# Patient Record
Sex: Male | Born: 1970 | Race: White | Hispanic: No | Smoking: Never smoker
Health system: Southern US, Community
[De-identification: ages and names within clinical notes are randomized; demographics above are authoritative.]

## PROBLEM LIST (undated history)

## (undated) ENCOUNTER — Emergency Department (HOSPITAL_COMMUNITY)

## (undated) DIAGNOSIS — I872 Venous insufficiency (chronic) (peripheral): Secondary | ICD-10-CM

## (undated) DIAGNOSIS — N289 Disorder of kidney and ureter, unspecified: Secondary | ICD-10-CM

## (undated) DIAGNOSIS — I10 Essential (primary) hypertension: Secondary | ICD-10-CM

## (undated) DIAGNOSIS — I861 Scrotal varices: Secondary | ICD-10-CM

## (undated) DIAGNOSIS — Z8619 Personal history of other infectious and parasitic diseases: Secondary | ICD-10-CM

## (undated) DIAGNOSIS — Z87448 Personal history of other diseases of urinary system: Secondary | ICD-10-CM

## (undated) DIAGNOSIS — S82409A Unspecified fracture of shaft of unspecified fibula, initial encounter for closed fracture: Secondary | ICD-10-CM

## (undated) DIAGNOSIS — S82209A Unspecified fracture of shaft of unspecified tibia, initial encounter for closed fracture: Secondary | ICD-10-CM

## (undated) HISTORY — DX: Personal history of other diseases of urinary system: Z87.448

## (undated) HISTORY — DX: Unspecified fracture of shaft of unspecified fibula, initial encounter for closed fracture: S82.409A

## (undated) HISTORY — DX: Unspecified fracture of shaft of unspecified tibia, initial encounter for closed fracture: S82.209A

## (undated) HISTORY — DX: Personal history of other infectious and parasitic diseases: Z86.19

## (undated) HISTORY — DX: Scrotal varices: I86.1

## (undated) HISTORY — DX: Venous insufficiency (chronic) (peripheral): I87.2

## (undated) HISTORY — DX: Disorder of kidney and ureter, unspecified: N28.9

## (undated) HISTORY — DX: Essential (primary) hypertension: I10

---
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
## 2001-04-21 ENCOUNTER — Encounter (INDEPENDENT_AMBULATORY_CARE_PROVIDER_SITE_OTHER): Payer: Self-pay | Admitting: Specialist

## 2001-04-21 ENCOUNTER — Other Ambulatory Visit: Admission: RE | Admit: 2001-04-21 | Discharge: 2001-04-21 | Payer: Self-pay | Admitting: Otolaryngology

## 2004-04-12 ENCOUNTER — Ambulatory Visit (HOSPITAL_COMMUNITY): Admission: RE | Admit: 2004-04-12 | Discharge: 2004-04-12 | Payer: Self-pay | Admitting: Nephrology

## 2004-04-14 ENCOUNTER — Encounter (INDEPENDENT_AMBULATORY_CARE_PROVIDER_SITE_OTHER): Payer: Self-pay | Admitting: Specialist

## 2004-04-14 ENCOUNTER — Ambulatory Visit (HOSPITAL_COMMUNITY): Admission: RE | Admit: 2004-04-14 | Discharge: 2004-04-15 | Payer: Self-pay | Admitting: Nephrology

## 2006-12-24 ENCOUNTER — Ambulatory Visit: Payer: Self-pay | Admitting: Vascular Surgery

## 2007-01-16 ENCOUNTER — Ambulatory Visit: Payer: Self-pay | Admitting: Vascular Surgery

## 2007-05-15 ENCOUNTER — Ambulatory Visit: Payer: Self-pay | Admitting: Vascular Surgery

## 2007-07-28 ENCOUNTER — Ambulatory Visit: Payer: Self-pay | Admitting: Vascular Surgery

## 2007-08-04 ENCOUNTER — Ambulatory Visit: Payer: Self-pay | Admitting: Vascular Surgery

## 2007-11-04 ENCOUNTER — Ambulatory Visit: Payer: Self-pay | Admitting: Vascular Surgery

## 2008-02-17 ENCOUNTER — Encounter: Payer: Self-pay | Admitting: Family Medicine

## 2008-11-04 ENCOUNTER — Ambulatory Visit: Payer: Self-pay | Admitting: Family Medicine

## 2008-11-04 DIAGNOSIS — M109 Gout, unspecified: Secondary | ICD-10-CM | POA: Insufficient documentation

## 2008-11-04 DIAGNOSIS — I151 Hypertension secondary to other renal disorders: Secondary | ICD-10-CM | POA: Insufficient documentation

## 2008-11-04 DIAGNOSIS — N028 Recurrent and persistent hematuria with other morphologic changes: Secondary | ICD-10-CM | POA: Insufficient documentation

## 2008-11-04 DIAGNOSIS — M2020 Hallux rigidus, unspecified foot: Secondary | ICD-10-CM | POA: Insufficient documentation

## 2008-11-05 LAB — CONVERTED CEMR LAB
Albumin: 4.2 g/dL (ref 3.5–5.2)
Calcium: 9.9 mg/dL (ref 8.4–10.5)
Creatinine, Ser: 2.4 mg/dL — ABNORMAL HIGH (ref 0.4–1.5)
GFR calc Af Amer: 39 mL/min
Glucose, Bld: 74 mg/dL (ref 70–99)
Potassium: 5 meq/L (ref 3.5–5.1)

## 2008-12-02 ENCOUNTER — Ambulatory Visit: Payer: Self-pay | Admitting: Family Medicine

## 2008-12-14 ENCOUNTER — Encounter (INDEPENDENT_AMBULATORY_CARE_PROVIDER_SITE_OTHER): Payer: Self-pay | Admitting: *Deleted

## 2008-12-14 ENCOUNTER — Ambulatory Visit: Payer: Self-pay | Admitting: Family Medicine

## 2008-12-14 LAB — CONVERTED CEMR LAB
HDL: 44 mg/dL (ref >39.0)
VLDL: 21 mg/dL (ref 0–40)

## 2009-02-26 ENCOUNTER — Ambulatory Visit: Payer: Self-pay | Admitting: Cardiology

## 2009-03-03 ENCOUNTER — Encounter: Payer: Self-pay | Admitting: Cardiology

## 2009-03-03 ENCOUNTER — Ambulatory Visit: Payer: Self-pay | Admitting: Family Medicine

## 2009-03-03 DIAGNOSIS — R079 Chest pain, unspecified: Secondary | ICD-10-CM | POA: Insufficient documentation

## 2009-03-03 LAB — CONVERTED CEMR LAB
BUN: 22 mg/dL (ref 6–23)
CO2: 33 meq/L — ABNORMAL HIGH (ref 19–32)
Calcium: 9.7 mg/dL (ref 8.4–10.5)
Creatinine, Ser: 2 mg/dL — ABNORMAL HIGH (ref 0.4–1.5)
Glucose, Bld: 77 mg/dL (ref 70–99)
Lipase: 8 units/L — ABNORMAL LOW (ref 11.0–59.0)
Potassium: 5.1 meq/L (ref 3.5–5.1)

## 2009-03-07 ENCOUNTER — Encounter: Payer: Self-pay | Admitting: Cardiology

## 2009-03-07 ENCOUNTER — Ambulatory Visit: Payer: Self-pay | Admitting: Cardiology

## 2009-03-07 LAB — CONVERTED CEMR LAB
Cholesterol, target level: 200 mg/dL
LDL Goal: 160 mg/dL

## 2009-03-11 ENCOUNTER — Ambulatory Visit: Payer: Self-pay | Admitting: Cardiology

## 2009-03-21 ENCOUNTER — Encounter: Payer: Self-pay | Admitting: Family Medicine

## 2009-09-28 ENCOUNTER — Encounter: Payer: Self-pay | Admitting: Family Medicine

## 2009-10-29 ENCOUNTER — Encounter: Payer: Self-pay | Admitting: Family Medicine

## 2010-03-23 ENCOUNTER — Encounter: Payer: Self-pay | Admitting: Family Medicine

## 2010-04-04 ENCOUNTER — Ambulatory Visit: Payer: Self-pay | Admitting: Family Medicine

## 2010-04-04 ENCOUNTER — Telehealth (INDEPENDENT_AMBULATORY_CARE_PROVIDER_SITE_OTHER): Payer: Self-pay | Admitting: *Deleted

## 2010-04-04 LAB — CONVERTED CEMR LAB
LDL Cholesterol: 78 mg/dL (ref 0–99)
Total CHOL/HDL Ratio: 3
Triglycerides: 56 mg/dL (ref 0.0–149.0)

## 2010-04-06 ENCOUNTER — Ambulatory Visit: Payer: Self-pay | Admitting: Family Medicine

## 2010-04-06 DIAGNOSIS — R6882 Decreased libido: Secondary | ICD-10-CM | POA: Insufficient documentation

## 2010-04-06 DIAGNOSIS — R5383 Other fatigue: Secondary | ICD-10-CM

## 2010-04-06 DIAGNOSIS — R5381 Other malaise: Secondary | ICD-10-CM | POA: Insufficient documentation

## 2010-04-07 LAB — CONVERTED CEMR LAB
Sex Hormone Binding: 45 nmol/L (ref 13–71)
Testosterone Free: 79.4 pg/mL (ref 47.0–244.0)

## 2010-11-14 NOTE — Letter (Signed)
Summary: [facility]   Imported By: Bubba Hales 04/01/2010 11:38:04  _____________________________________________________________________  External Attachment:    Type:   Image     Comment:   External Document

## 2010-11-14 NOTE — Letter (Signed)
Summary: [facility]   Imported By: Phillis Knack 11/04/2009 14:25:56  _____________________________________________________________________  External Attachment:    Type:   Image     Comment:   External Document

## 2010-11-14 NOTE — Progress Notes (Signed)
----   Converted from flag ---- ---- 04/04/2010 8:31 AM, Owens Loffler MD wrote: FLP, v77.91  ---- 04/03/2010 7:56 AM, Daralene Milch CMA (AAMA) wrote: Pt is scheduled for cpx labs tomorrow, what labs to draw and dx codes? Thanks Tasha ------------------------------

## 2010-11-14 NOTE — Assessment & Plan Note (Signed)
Summary: CPX/CLE   Vital Signs:  Patient profile:   40 year old male Height:      75 inches Weight:      250.8 pounds BMI:     31.46 Temp:     99.0 degrees F oral Pulse rate:   60 / minute Pulse rhythm:   regular BP sitting:   140 / 80  (left arm) Cuff size:   regular  Vitals Entered By: Zenda Alpers CMA Deborra Medina) (April 06, 2010 2:27 PM)  History of Present Illness: Chief complaint cpx  40 year old male:  HTN - elevated initially, but now normal on recheck 120/70  CKD, Dr. Shelva Majestic notes reviewed. Cr stable  Fatigue and decrease sexual drive: stays tired all time. feeling tired. Has been a long time and decreased sexual desire over the last year -- able to usually get an erection, but not so much interested in initiating.     Preventive Screening-Counseling & Management  Alcohol-Tobacco     Alcohol Counseling: not indicated; use of alcohol is not excessive or problematic     Smoking Status: never     Tobacco Counseling: not indicated; no tobacco use  Caffeine-Diet-Exercise     Diet Comments: working on diet     Diet Counseling: not indicated; diet is assessed to be healthy     Does Patient Exercise: yes     Type of exercise: running     Exercise (avg: min/session): 30-60     Times/week: 5     Exercise Counseling: not indicated; exercise is adequate  Hep-HIV-STD-Contraception     HIV Risk: no risk noted     STD Risk: no risk noted     Testicular SE Education/Counseling to perform regular STE      Sexual History:  currently monogamous.        Drug Use:  never.    Clinical Review Panels:  Immunizations   Last Tetanus Booster:  Tdap (12/02/2008)  Lipid Management   Cholesterol:  145 (04/04/2010)   LDL (bad choesterol):  78 (04/04/2010)   HDL (good cholesterol):  55.80 (04/04/2010)  Diabetes Management   Creatinine:  2.0 (03/03/2009)   Allergies (verified): No Known Drug Allergies  Past History:  Past medical, surgical, family and social  histories (including risk factors) reviewed, and no changes noted (except as noted below).  Past Medical History: Reviewed history from 03/07/2009 and no changes required. 1. Hypertension 2. Ig A Nephropathy - dx. 2002.  Microscopic hematuria and proteinuria.  3. Tib/Fib Fx, ORIF, 1990 4. Gout, non-crystal proven 5. Varicocele, L testes 6. Venous incompetence R leg  Dr. Moshe Cipro, [facility]  Past Surgical History: Reviewed history from 11/04/2008 and no changes required. Tib/Fib Fx, ORIF, 1990 Appendectomy, 1982  Family History: Reviewed history from 11/04/2008 and no changes required. HTN: GP DM: GP  Social History: Reviewed history from 11/04/2008 and no changes required. Divorced Primary custody of son [place] Never Smoked Alcohol use-no Drug use-no Regular exercise-yes HIV Risk:  no risk noted STD Risk:  no risk noted Sexual History:  currently monogamous Drug Use:  never  Review of Systems       The patient complains of weight loss.  The patient denies anorexia, fever, weight gain, vision loss, decreased hearing, hoarseness, chest pain, syncope, dyspnea on exertion, peripheral edema, prolonged cough, headaches, hemoptysis, abdominal pain, melena, hematochezia, severe indigestion/heartburn, hematuria, incontinence, genital sores, suspicious skin lesions, depression, abnormal bleeding, enlarged lymph nodes, and testicular masses.  Otherwise, the pertinent positives and negatives are listed above and in the HPI, otherwise a full review of systems has been reviewed and is negative unless noted positive.   HAS BEEN TRYING TO LOSE WEIGHT - 100 POUND LOSS  Physical Exam  General:  Well-developed,well-nourished,in no acute distress; alert,appropriate and cooperative throughout examination Head:  Normocephalic and atraumatic without obvious abnormalities. No apparent alopecia or balding. Eyes:  pupils equal, pupils round, pupils reactive to  light, and pupils react to accomodation.   Ears:  External ear exam shows no significant lesions or deformities.  Otoscopic examination reveals clear canals, tympanic membranes are intact bilaterally without bulging, retraction, inflammation or discharge. Hearing is grossly normal bilaterally. Nose:  External nasal examination shows no deformity or inflammation. Nasal mucosa are pink and moist without lesions or exudates. Mouth:  Oral mucosa and oropharynx without lesions or exudates.  Teeth in good repair. Neck:  No deformities, masses, or tenderness noted. Chest Wall:  No deformities, masses, tenderness or gynecomastia noted. Lungs:  Normal respiratory effort, chest expands symmetrically. Lungs are clear to auscultation, no crackles or wheezes. Heart:  Normal rate and regular rhythm. S1 and S2 normal without gallop, murmur, click, rub or other extra sounds. Abdomen:  Bowel sounds positive,abdomen soft and non-tender without masses, organomegaly or hernias noted. Genitalia:  circumcised, no scrotal masses, no testicular masses or atrophy, no cutaneous lesions, and no urethral discharge.  varicocele Msk:  normal ROM and no crepitation.   Extremities:  No clubbing, cyanosis, edema, or deformity noted with normal full range of motion of all joints.   Neurologic:  alert & oriented X3 and gait normal.   Skin:  Intact without suspicious lesions or rashes Cervical Nodes:  No lymphadenopathy noted Inguinal Nodes:  No significant adenopathy Psych:  Cognition and judgment appear intact. Alert and cooperative with normal attention span and concentration. No apparent delusions, illusions, hallucinations   Impression & Recommendations:  Problem # 1:  [place] (ICD-V70.0) The patient's preventative maintenance and recommended screening tests for an annual wellness exam were reviewed in full today. Brought up to date unless services declined.  Counselled on the importance of diet, exercise,  and its role in overall health and mortality. The patient's FH and SH was reviewed, including their home life, tobacco status, and drug and alcohol status.   Keep up the running - training for marathon  Problem # 2:  FATIGUE (ICD-780.79) Assessment: New fatigue with decreased libido desires testosterone level check  Orders: Venipuncture [id number]) TLB-TSH (Thyroid Stimulating Hormone) (84443-TSH) T- * Misc. Laboratory test [phone])  Problem # 3:  LIBIDO, DECREASED (ICD-799.81) Assessment: New  Complete Medication List: 1)  Benazepril Hcl 40 Mg Tabs (Benazepril hcl) .... Take one by mouth daily 2)  Fish Oil Oil (Fish oil) .Marland Kitchen.. 1200 mg. once daily  Current Allergies (reviewed today): No known allergies

## 2011-01-24 ENCOUNTER — Encounter: Payer: Self-pay | Admitting: Family Medicine

## 2011-02-27 NOTE — Procedures (Signed)
DUPLEX DEEP VENOUS EXAM - LOWER EXTREMITY   INDICATION:  Follow-up evaluation of right leg, status post greater  saphenous vein ablation.   HISTORY:  Edema:  No.  Trauma/Surgery:  Ablation of two branches of the right greater saphenous  vein on 07/28/07 with laser by Dr. Kellie Simmering.  Pain:  No.  PE:  No.  Previous DVT:  No.  Anticoagulants:  No.  Other:   DUPLEX EXAM:                CFV   SFV   PopV  PTV    GSV                R  L  R  L  R  L  R   L  R  L  Thrombosis    o  o  o     o     o      +  Spontaneous   +  +  +     +     +      0  Phasic        +  +  +     +     +      0  Augmentation  +  +  +     +     +      0  Compressible  +  +  +     +     +      0  Competent     0  +  +     +     +      0   Legend:  + - yes  o - no  p - partial  D - decreased   IMPRESSION:  1. Previously ablated right greater saphenous vein and anterior branch      appear to be thrombosed.  2. No evidence of right leg deep venous thrombosis or Baker's cyst.  3. The right common femoral vein is patent.  4. The right common femoral vein is incompetent, and there are small      incompetent branches seen at the proximal anterior branch proximal      to the thrombus.      _____________________________  Nelda Severe Kellie Simmering, M.D.   MC/MEDQ  D:  11/04/2007  T:  11/04/2007  Job:  [id number]

## 2011-02-27 NOTE — Assessment & Plan Note (Signed)
OFFICE VISIT   FLEET, RIGGAN  DOB:  16-Nov-1970                                       08/04/2007  [id number]   Eric Douglas returns one week postlaser ablation right greater saphenous  vein, which required laser ablation of not only the greater saphenous  vein, but also the anterior branch of the great saphenous vein of the  right leg. These two converged very close to the saphenofemoral  junction. A laser ablation was performed on each of these. He also had  multiple stab phlebectomies in the right thigh and calf.  He returns  today with no symptoms of discomfort related to stab phlebectomies. He  has had some aching in the thigh as one would expect related to the  laser ablation procedures. He has had some mild bruising. He has had no  distal edema or radicular pain in the leg.   On examination, blood pressure is 146/80. Heart rate is 64. Respirations  are 14. His right leg is healing nicely with some mild ecchymosis in the  thigh near the entrance site in the distal thigh. There is no distal  edema. He has excellent arterial pulses. I performed a limited venous  duplex exam today and there is no evidence of any deep venous  obstruction or thrombus.  There is normal flow in the deep system. He  does have occlusion of both the anterior branch and the greater  saphenous vein from near the saphenofemoral junction throughout the  thigh.  I reassured him regarding these findings and he will return to  see [place] in three months for final follow up.  He will wear his elastic  stocking for another few weeks before discontinuing it.   Eric Douglas, M.D.  Electronically Signed   JDL/MEDQ  D:  08/04/2007  T:  08/05/2007  Job:  [id number]

## 2011-02-27 NOTE — Procedures (Signed)
LOWER EXTREMITY VENOUS REFLUX EXAM   INDICATION:  Right leg posterior calf varicose veins.   The patient is status post left leg greater saphenous vein stripping and  stab phlebectomy   EXAM:  Using color-flow imaging and pulse Doppler spectral analysis, the  right common femoral, superficial femoral, popliteal, posterior tibial,  greater and lesser saphenous veins are evaluated.  There is evidence  suggesting deep venous insufficiency in the right lower extremity.   The right saphenofemoral junction is not competent.  The right greater  saphenous vein is not competent with the caliber as described below.   The right proximal short saphenous vein demonstrates incompetency.   GSV Diameter (used if found to be incompetent only)                                            Right    Left  Proximal Greater Saphenous Vein           0.46 cm  cm  Proximal-to-mid-thigh                     0.36 cm  cm  Mid thigh                                 0.38 cm  cm  Mid-distal thigh                          0.45 cm  cm  Distal thigh                              0.38 cm  cm  Knee                                      0.36 cm  cm   PROXIMAL GREATER SAPHENOUS VEIN ANTERIOR BRANCH:  0.31   PROXIMAL TO MID-THIGH ANTERIOR BRANCH:  0.35   MID-THIGH ANTERIOR BRANCH:  0.26   MID-DISTAL THIGH ANTERIOR BRANCH:  0.25   DISTAL THIGH ANTERIOR BRANCH:  0.23   IMPRESSION:  1. Right greater saphenous vein reflux is identified with the caliber      ranging from 0.36 cm to 0.46 cm knee to groin.  2. The right greater saphenous vein is not aneurysmal.  3. The right greater saphenous vein is not tortuous.  4. The deep venous system is not competent.  5. The right lesser saphenous vein is not competent.  6. The anterior branch of the greater saphenous vein is incompetent,      and a branch 10 cm proximal to the knee gives rise to the most      prominent varicose veins.   ___________________________________________  Dr. Kellie Simmering   MC/MEDQ  D:  05/15/2007  T:  05/16/2007  Job:  [id number]

## 2011-02-27 NOTE — Assessment & Plan Note (Signed)
OFFICE VISIT   Eric Douglas, Eric Douglas  DOB:  1971-04-09                                       07/28/2007  [id number]   PREOPERATIVE DIAGNOSIS:  Severe venous insufficiency right leg secondary  to greater saphenous and anterior saphenous reflux.   ADDENDUM:  Addendum to surgical procedure performed October 13.  The  patient underwent laser ablation to the right greater saphenous vein  with multiple stab phlebectomies.  He was noted on his preoperative  duplex scan to have severe reflux not only in his greater saphenous vein  but also a large anterior saphenous vein that ran parallel to the  greater saphenous vein and supplied several of the varicosities in the  distal thigh, which were symptomatic.  These 2 veins converged in too  close a proximity to the common femoral vein that it was not felt safe  to ablate the greater saphenous across the origin of the anterior  saphenous branch, and this necessitated laser ablation procedures in  both of these veins.  Therefore, after infiltration with Xylocaine, not  only was the right greater saphenous vein cannulated with a long sheath  for laser ablation, but in a similar fashion, the right anterior  saphenous vein was cannulated with a shorter sheath up to a point just  proximal to the saphenofemoral junction within the anterior branch.  Laser ablation was then performed and not only the right greater  saphenous vein, but also the right anterior saphenous vein without  difficulty.  Following this, multiple stab phlebectomies were performed  as described in the original operative note.  This secondary procedure  did increase the operative time approximately 30 minutes for the  secondary procedure.   Nelda Severe Kellie Simmering, M.D.  Electronically Signed   JDL/MEDQ  D:  07/29/2007  T:  07/30/2007  Job:  [id number]

## 2011-02-27 NOTE — Assessment & Plan Note (Signed)
OFFICE VISIT   MIKO, Eric Douglas  DOB:  [birth year]                                       11/04/2007  [id number]   The patient returns now 3 months post bilateral venous procedures in the  office.  He had stab phlebectomies performed in the left leg,  particularly in the posterior thigh and the calf, having had previous  vein stripping in the left leg many years ago.  On the right side, he  had laser ablation of 2 parallel venous systems in the thigh with the  anterior accessory and greater saphenous systems.  He then had multiple  stab phlebectomies.  He has had tremendous improvement in his aching and  throbbing discomfort in both legs, as well as the swelling in the calf  and ankle areas.  He is currently having no edema and no discomfort, and  is not wearing elastic compression stockings.  He has had no stasis  ulcers or bleeding.   EXAM:  Blood pressure 146/81.  Both lower extremities have excellent  femoral, popliteal, and posterior tibial pulses at 3+.  There is no  evidence of any recurrent varicosities in the calf or thigh either  anteriorly or posteriorly.  No distal edema is noted.  There is no  hyperpigmentation or ulceration noted.  Final venous duplex exam was  performed today on the right side and both channels are chronically  occluded where the laser ablation was performed in the thigh with the  filling of 1 small circumflex epigastric branch near the saphenofemoral  junction.  The deep venous system is widely patent.  He was reassured  regarding these findings.  Final photographs were taken.  He will return  to see [place] on a p.r.n. basis.   Nelda Severe Kellie Simmering, M.D.  Electronically Signed   JDL/MEDQ  D:  11/04/2007  T:  11/05/2007  Job:  [id number]

## 2011-02-27 NOTE — Assessment & Plan Note (Signed)
OFFICE VISIT   SKEETER, HOOPLE  DOB:  1971-07-11                                       05/15/2007  [id number]   Eric Douglas is status post ligation and stripping of his greater  saphenous vein at [facility] in 1995 and underwent some multiple stab  phlebectomies to relieve his symptoms in the left leg.  He has done very  well from that standpoint.  He now returns for symptoms involving the  contralateral right leg which involves his distal thigh and calf area.  He has worn elastic compression stockings for the last three months but  is unable to relieve these symptoms which he describes as aching,  throbbing and itching and discomfort.  These are relieved by elevating  his legs which he is unable during work.  He does take analgesics, but  they have not completely relieved his symptoms either.  The  symptomatology seems to be becoming more and more worse and affecting  his daily living   On exam he has 3+ femoral, popliteal and dorsalis pedis pulses are  palpable in the right leg.  He has some mild dyslipidemia on the right  side.  He has a prominent varix in the posterior leg which extends up  the calf into the distal thigh and communicates to the greater saphenous  system immediately.  There is no hyperpigmentation or ulceration noted.  A venous duplex exam was performed in our office today and has the  following findings.  He has reflux and valvular incompetence involving  the right saphenous-femoral junction which involves not only the greater  saphenous vein but also a large anterior branch which communicates at  the junction.  The short saphenous vein is also incompetent.  It does  not appear to be the primary source of the varicosities.   I feel that [name] would be best treated by laser ablation of his  right greater saphenous vein and also the anterior saphenous branch to  eliminate the venous hypertension in his thigh and then  perform stab  phlebectomies over these varicosities in the posterior calf and thigh.  We will proceed with precertification.  If he should develop recurrent  varicosities in the future then the short saphenous system could be  treated.  He has been wearing his long leg elastic compression stockings  over the last four months without relief of symptoms.   Nelda Severe Kellie Simmering, M.D.  Electronically Signed   JDL/MEDQ  D:  05/15/2007  T:  05/16/2007  Job:  [id number]

## 2011-05-09 ENCOUNTER — Encounter: Payer: Self-pay | Admitting: Cardiology

## 2013-04-01 ENCOUNTER — Encounter: Payer: Self-pay | Admitting: Nephrology

## 2016-01-24 ENCOUNTER — Ambulatory Visit (INDEPENDENT_AMBULATORY_CARE_PROVIDER_SITE_OTHER): Payer: BLUE CROSS/BLUE SHIELD | Admitting: Internal Medicine

## 2016-01-24 ENCOUNTER — Encounter: Payer: Self-pay | Admitting: Internal Medicine

## 2016-01-24 VITALS — BP 130/76 | HR 72 | Temp 99.0°F | Ht 77.25 in | Wt 337.8 lb

## 2016-01-24 DIAGNOSIS — N289 Disorder of kidney and ureter, unspecified: Secondary | ICD-10-CM

## 2016-01-24 DIAGNOSIS — I151 Hypertension secondary to other renal disorders: Secondary | ICD-10-CM | POA: Diagnosis not present

## 2016-01-24 DIAGNOSIS — M1A30X0 Chronic gout due to renal impairment, unspecified site, without tophus (tophi): Secondary | ICD-10-CM

## 2016-01-24 DIAGNOSIS — N079 Hereditary nephropathy, not elsewhere classified with unspecified morphologic lesions: Secondary | ICD-10-CM

## 2016-01-24 DIAGNOSIS — N028 Recurrent and persistent hematuria with other morphologic changes: Secondary | ICD-10-CM

## 2016-01-24 NOTE — Patient Instructions (Signed)
Hypertension Hypertension, commonly called high blood pressure, is when the force of blood pumping through your arteries is too strong. Your arteries are the blood vessels that carry blood from your heart throughout your body. A blood pressure reading consists of a higher number over a lower number, such as 110/72. The higher number (systolic) is the pressure inside your arteries when your heart pumps. The lower number (diastolic) is the pressure inside your arteries when your heart relaxes. Ideally you want your blood pressure below 120/80. Hypertension forces your heart to work harder to pump blood. Your arteries may become narrow or stiff. Having untreated or uncontrolled hypertension can cause heart attack, stroke, kidney disease, and other problems. RISK FACTORS Some risk factors for high blood pressure are controllable. Others are not.  Risk factors you cannot control include:   Race. You may be at higher risk if you are African American.  Age. Risk increases with age.  Gender. Men are at higher risk than women before age 45 years. After age 65, women are at higher risk than men. Risk factors you can control include:  Not getting enough exercise or physical activity.  Being overweight.  Getting too much fat, sugar, calories, or salt in your diet.  Drinking too much alcohol. SIGNS AND SYMPTOMS Hypertension does not usually cause signs or symptoms. Extremely high blood pressure (hypertensive crisis) may cause headache, anxiety, shortness of breath, and nosebleed. DIAGNOSIS To check if you have hypertension, your health care provider will measure your blood pressure while you are seated, with your arm held at the level of your heart. It should be measured at least twice using the same arm. Certain conditions can cause a difference in blood pressure between your right and left arms. A blood pressure reading that is higher than normal on one occasion does not mean that you need treatment. If  it is not clear whether you have high blood pressure, you may be asked to return on a different day to have your blood pressure checked again. Or, you may be asked to monitor your blood pressure at home for 1 or more weeks. TREATMENT Treating high blood pressure includes making lifestyle changes and possibly taking medicine. Living a healthy lifestyle can help lower high blood pressure. You may need to change some of your habits. Lifestyle changes may include:  Following the DASH diet. This diet is high in fruits, vegetables, and whole grains. It is low in salt, red meat, and added sugars.  Keep your sodium intake below 2,300 mg per day.  Getting at least 30-45 minutes of aerobic exercise at least 4 times per week.  Losing weight if necessary.  Not smoking.  Limiting alcoholic beverages.  Learning ways to reduce stress. Your health care provider may prescribe medicine if lifestyle changes are not enough to get your blood pressure under control, and if one of the following is true:  You are 18-59 years of age and your systolic blood pressure is above 140.  You are 60 years of age or older, and your systolic blood pressure is above 150.  Your diastolic blood pressure is above 90.  You have diabetes, and your systolic blood pressure is over 140 or your diastolic blood pressure is over 90.  You have kidney disease and your blood pressure is above 140/90.  You have heart disease and your blood pressure is above 140/90. Your personal target blood pressure may vary depending on your medical conditions, your age, and other factors. HOME CARE INSTRUCTIONS    Have your blood pressure rechecked as directed by your health care provider.   Take medicines only as directed by your health care provider. Follow the directions carefully. Blood pressure medicines must be taken as prescribed. The medicine does not work as well when you skip doses. Skipping doses also puts you at risk for  problems.  Do not smoke.   Monitor your blood pressure at home as directed by your health care provider. SEEK MEDICAL CARE IF:   You think you are having a reaction to medicines taken.  You have recurrent headaches or feel dizzy.  You have swelling in your ankles.  You have trouble with your vision. SEEK IMMEDIATE MEDICAL CARE IF:  You develop a severe headache or confusion.  You have unusual weakness, numbness, or feel faint.  You have severe chest or abdominal pain.  You vomit repeatedly.  You have trouble breathing. MAKE SURE YOU:   Understand these instructions.  Will watch your condition.  Will get help right away if you are not doing well or get worse.   This information is not intended to replace advice given to you by your health care provider. Make sure you discuss any questions you have with your health care provider.   Document Released: 10/01/2005 Document Revised: 02/15/2015 Document Reviewed: 07/24/2013 Elsevier Interactive Patient Education 2016 [employer]  

## 2016-01-24 NOTE — Assessment & Plan Note (Signed)
Continue Benazepril He will continue to follow with [place]

## 2016-01-24 NOTE — Assessment & Plan Note (Signed)
Continue Benazepril He will continue to follow with [place]

## 2016-01-24 NOTE — Progress Notes (Signed)
HPI  Pt presents to the clinic today to establish care. He has not had a PCP in many years but he has been following with [facility].  Gout: He takes Allopurinol daily. His last flare was about 18 months ago.  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 He takes Benazepril as prescribed. His BP today is 130/76. He does follow with [facility].  Obesity: BMI of 39. He has lost about 25 lbs in the last 4 months. He is working on improving his diet and increasing his exercise.  Flu: 07/2015 Tetanus: 2010 Vision Screening: yearly Dentist: biannually  Past Medical History  Diagnosis Date  . HTN (hypertension)   . Nephropathy     Ig A; dx 2002. micropscopic hematuria and proteinuria  . Tibia fracture     ORIF 1990  . Fibula fracture     ORIF 1990  . Gout     non crystal proven   . Varicocele     L testes  . Venous incompetence     R leg  . History of chickenpox   . History of kidney disease     Current Outpatient Prescriptions  Medication Sig Dispense Refill  . allopurinol (ZYLOPRIM) 100 MG tablet Take 1 tablet by mouth daily.  2  . benazepril (LOTENSIN) 20 MG tablet Take 0.5 tablets by mouth daily.  0   No current facility-administered medications for this visit.    No Known Allergies  Family History  Problem Relation Age of Onset  . Hypertension      GP  . Diabetes      DM - GP     Social History   Social History  . Marital Status: Married    Spouse Name: N/A  . Number of Children: N/A  . Years of Education: N/A   Occupational History  . Not on file.   Social History Main Topics  . Smoking status: Never Smoker   . Smokeless tobacco: Never Used  . Alcohol Use: 0.0 oz/week    0 Standard drinks or equivalent per week     Comment: occ  . Drug Use: No  . Sexual Activity: Not on file   Other Topics Concern  . Not on file   Social History Narrative   Divorced, primary custody of son; [name]; gets regular exercise.        Pt signed designated party release and gives no access to medical records. Can leave msg on cell [phone].     ROS:  Constitutional: Denies fever, malaise, fatigue, headache or abrupt weight changes.  HEENT: Denies eye pain, eye redness, ear pain, ringing in the ears, wax buildup, runny nose, nasal congestion, bloody nose, or sore throat. Respiratory: Denies difficulty breathing, shortness of breath, cough or sputum production.   Cardiovascular: Denies chest pain, chest tightness, palpitations or swelling in the hands or feet.  Gastrointestinal: Denies abdominal pain, bloating, constipation, diarrhea or blood in the stool.  GU: Denies frequency, urgency, pain with urination, blood in urine, odor or discharge. Musculoskeletal: Denies decrease in range of motion, difficulty with gait, muscle pain or joint pain and swelling.  Skin: Denies redness, rashes, lesions or ulcercations.  Neurological: Denies dizziness, difficulty with memory, difficulty with speech or problems with balance and coordination.  Psych: Denies anxiety, depression, SI/HI.  No other specific complaints in a complete review of systems (except as listed in HPI above).  PE:  BP 130/76 mmHg  Pulse 72  Temp(Src) 99 F (37.2 C) (Oral)  Ht 6' 5.25" (1.962 m)  Wt 337 lb 12 oz (153.202 kg)  BMI 39.80 kg/m2  SpO2 97% Wt Readings from Last 3 Encounters:  01/24/16 337 lb 12 oz (153.202 kg)  04/06/10 250 lb 12.8 oz (113.762 kg)  03/07/09 269 lb 8 oz (122.244 kg)    General: Appears his stated age, obese in NAD. Skin: Warm, dry and intact. Cardiovascular: Normal rate and rhythm. S1,S2 noted.  No murmur, rubs or gallops noted. No JVD or BLE edema.  Pulmonary/Chest: Normal effort and positive vesicular breath sounds. No respiratory distress. No wheezes, rales or ronchi noted.  Neurological: Alert and oriented.  Psychiatric: Mood and affect normal. Behavior is normal. Judgment and thought content normal.    BMET     Component Value Date/Time   NA 143 03/03/2009 0853   K 5.1 03/03/2009 0853   CL 104 03/03/2009 0853   CO2 33* 03/03/2009 0853   GLUCOSE 77 03/03/2009 0853   BUN 22 03/03/2009 0853   CREATININE 2.0* 03/03/2009 0853   CALCIUM 9.7 03/03/2009 0853   GFRNONAA 39.80 03/03/2009 0853   GFRAA 39 11/04/2008 1133    Lipid Panel     Component Value Date/Time   CHOL 145 04/04/2010 0833   TRIG 56.0 04/04/2010 0833   HDL 55.80 04/04/2010 0833   CHOLHDL 3 04/04/2010 0833   VLDL 11.2 04/04/2010 0833   LDLCALC 78 04/04/2010 0833     Assessment and Plan:

## 2016-01-24 NOTE — Assessment & Plan Note (Signed)
Continue Allopurinol daily Will check uric acid with annual exam

## 2016-01-24 NOTE — Progress Notes (Signed)
Pre visit review using our clinic review tool, if applicable. No additional management support is needed unless otherwise documented below in the visit note. 

## 2016-05-03 ENCOUNTER — Ambulatory Visit (INDEPENDENT_AMBULATORY_CARE_PROVIDER_SITE_OTHER): Payer: BLUE CROSS/BLUE SHIELD | Admitting: Internal Medicine

## 2016-05-03 ENCOUNTER — Encounter: Payer: Self-pay | Admitting: Internal Medicine

## 2016-05-03 ENCOUNTER — Ambulatory Visit (INDEPENDENT_AMBULATORY_CARE_PROVIDER_SITE_OTHER)
Admission: RE | Admit: 2016-05-03 | Discharge: 2016-05-03 | Disposition: A | Discharge status: Home / Self Care | Payer: BLUE CROSS/BLUE SHIELD | Source: Ambulatory Visit | Attending: Internal Medicine | Admitting: Internal Medicine

## 2016-05-03 VITALS — BP 122/80 | HR 76 | Temp 98.4°F | Ht 77.0 in | Wt 291.0 lb

## 2016-05-03 DIAGNOSIS — M109 Gout, unspecified: Secondary | ICD-10-CM

## 2016-05-03 DIAGNOSIS — N028 Recurrent and persistent hematuria with other morphologic changes: Secondary | ICD-10-CM

## 2016-05-03 DIAGNOSIS — R5383 Other fatigue: Secondary | ICD-10-CM | POA: Diagnosis not present

## 2016-05-03 DIAGNOSIS — N02B9 Other recurrent and persistent immunoglobulin A nephropathy: Secondary | ICD-10-CM

## 2016-05-03 DIAGNOSIS — N079 Hereditary nephropathy, not elsewhere classified with unspecified morphologic lesions: Secondary | ICD-10-CM

## 2016-05-03 DIAGNOSIS — M25561 Pain in right knee: Secondary | ICD-10-CM

## 2016-05-03 DIAGNOSIS — Z0001 Encounter for general adult medical examination with abnormal findings: Secondary | ICD-10-CM

## 2016-05-03 DIAGNOSIS — Z114 Encounter for screening for human immunodeficiency virus [HIV]: Secondary | ICD-10-CM

## 2016-05-03 DIAGNOSIS — Z0000 Encounter for general adult medical examination without abnormal findings: Secondary | ICD-10-CM

## 2016-05-03 DIAGNOSIS — I10 Essential (primary) hypertension: Secondary | ICD-10-CM

## 2016-05-03 LAB — URIC ACID: Uric Acid, Serum: 7 mg/dL (ref 4.0–7.8)

## 2016-05-03 LAB — CBC
HCT: 38.7 % — ABNORMAL LOW (ref 39.0–52.0)
Hemoglobin: 12.8 g/dL — ABNORMAL LOW (ref 13.0–17.0)
MCHC: 33.2 g/dL (ref 30.0–36.0)
MCV: 91.7 fl (ref 78.0–100.0)
Platelets: 185 10*3/uL (ref 150.0–400.0)
RBC: 4.22 Mil/uL (ref 4.22–5.81)
RDW: 13.2 % (ref 11.5–15.5)
WBC: 4.9 10*3/uL (ref 4.0–10.5)

## 2016-05-03 LAB — COMPREHENSIVE METABOLIC PANEL
ALK PHOS: 64 U/L (ref 39–117)
ALT: 27 U/L (ref 0–53)
AST: 21 U/L (ref 0–37)
Albumin: 4.3 g/dL (ref 3.5–5.2)
BILIRUBIN TOTAL: 0.4 mg/dL (ref 0.2–1.2)
BUN: 65 mg/dL — AB (ref 6–23)
CO2: 23 mEq/L (ref 19–32)
Calcium: 9.8 mg/dL (ref 8.4–10.5)
Chloride: 109 mEq/L (ref 96–112)
Creatinine, Ser: 2.95 mg/dL — ABNORMAL HIGH (ref 0.40–1.50)
GFR: 24.55 mL/min — AB (ref >60.00)
GLUCOSE: 93 mg/dL (ref 70–99)
Potassium: 5.2 mEq/L — ABNORMAL HIGH (ref 3.5–5.1)
SODIUM: 140 meq/L (ref 135–145)
TOTAL PROTEIN: 7.2 g/dL (ref 6.0–8.3)

## 2016-05-03 LAB — LIPID PANEL
Cholesterol: 122 mg/dL (ref 0–200)
HDL: 41.9 mg/dL (ref >39.00)
LDL Cholesterol: 68 mg/dL (ref 0–99)
NonHDL: 80.47
Total CHOL/HDL Ratio: 3
Triglycerides: 62 mg/dL (ref 0.0–149.0)
VLDL: 12.4 mg/dL (ref 0.0–40.0)

## 2016-05-03 LAB — TSH: TSH: 0.98 u[IU]/mL (ref 0.35–4.50)

## 2016-05-03 NOTE — Progress Notes (Signed)
Pre visit review using our clinic review tool, if applicable. No additional management support is needed unless otherwise documented below in the visit note. 

## 2016-05-03 NOTE — Addendum Note (Signed)
Addended by: Marchia Bond on: 05/03/2016 04:24 PM   Modules accepted: Miquel Dunn

## 2016-05-03 NOTE — Addendum Note (Signed)
Addended byDonnie Coffin on: 05/03/2016 11:54 AM   Modules accepted: Miquel Dunn

## 2016-05-03 NOTE — Progress Notes (Signed)
Subjective:    Patient ID: Eric Douglas, male    DOB: 09/19/1971, 45 y.o.   MRN: [id number]  HPI  Pt presents to the clinic today for his annual exam.  Flu: 07/2015 Tetanus: 2010 Vision: yearly Dentist: biannually  Diet: He eats mostly at home. He does consume meats, fruits and vegetables. He drinks mostly water or Diet [facility] throughout the day. Exercise: He is running 2 miles 7 days a week  Gout: He is taking Allopurinol daily.  His last flare was 2 years ago.  IgA nephropathy: He is taking Benazapril. Follows with Dr. Clover [facility]aly at [facility].  HTN: He is taking Benazapril. His blood pressure today is 122/80. ECG from 02/2009 reviewed.  He also complains of right knee pain This started a few months ago. He describes the pain as sharp at a severity of 6/10. He denies any injury to the area. He reports the pain does not occur while he is running, but is making sleep difficult at night. He has not taken anything OTC for this.   Review of Systems      Past Medical History  Diagnosis Date  . HTN (hypertension)   . Nephropathy     Ig A; dx 2002. micropscopic hematuria and proteinuria  . Tibia fracture     ORIF 1990  . Fibula fracture     ORIF 1990  . Gout     non crystal proven   . Varicocele     L testes  . Venous incompetence     R leg  . History of chickenpox   . History of kidney disease     Current Outpatient Prescriptions  Medication Sig Dispense Refill  . allopurinol (ZYLOPRIM) 100 MG tablet Take 1 tablet by mouth daily.  2  . benazepril (LOTENSIN) 20 MG tablet Take 0.5 tablets by mouth daily.  0   No current facility-administered medications for this visit.    No Known Allergies  Family History  Problem Relation Age of Onset  . Hypertension      GP  . Stroke    . Diabetes      DM - GP   . Cancer Neg Hx   . Heart disease Neg Hx     Social History   Social History  . Marital Status: Married    Spouse Name: N/A    . Number of Children: N/A  . Years of Education: N/A   Occupational History  . Not on file.   Social History Main Topics  . Smoking status: Never Smoker   . Smokeless tobacco: Never Used  . Alcohol Use: 0.0 oz/week    0 Standard drinks or equivalent per week     Comment: occasional  . Drug Use: No  . Sexual Activity: Yes   Other Topics Concern  . Not on file   Social History Narrative   Divorced, primary custody of son; [name]; gets regular exercise.       Pt signed designated party release and gives no access to medical records. Can leave msg on cell [phone].      Constitutional: Pt reports fatigue.  Denies fever, malaise, headache or abrupt weight changes.  HEENT: Denies eye pain, eye redness, ear pain, ringing in the ears, wax buildup, runny nose, nasal congestion, bloody nose, or sore throat. Respiratory: Denies difficulty breathing, shortness of breath, cough or sputum production.   Cardiovascular: Denies chest pain, chest tightness, palpitations or swelling in the hands or  feet.  Gastrointestinal: Denies abdominal pain, bloating, constipation, diarrhea or blood in the stool.  GU: Denies urgency, frequency, pain with urination, burning sensation, blood in urine, odor or discharge. Musculoskeletal: Pt reports right knee pain.  Denies decrease in range of motion, difficulty with gait, and swelling.  Skin: Denies redness, rashes, lesions or ulcercations.  Neurological: Denies dizziness, difficulty with memory, difficulty with speech or problems with balance and coordination.  Psych: Denies anxiety, depression, SI/HI.  No other specific complaints in a complete review of systems (except as listed in HPI above).  Objective:   Physical Exam  BP 122/80 mmHg  Pulse 76  Temp(Src) 98.4 F (36.9 C) (Oral)  Ht 6\' 5"  (1.956 m)  Wt 291 lb (131.997 kg)  BMI 34.50 kg/m2  SpO2 98% Wt Readings from Last 3 Encounters:  05/03/16 291 lb (131.997 kg)  01/24/16 337  lb 12 oz (153.202 kg)  04/06/10 250 lb 12.8 oz (113.762 kg)    General: Appears his stated age, obese in NAD. Skin: Warm, dry and intact.  HEENT: Head: normal shape and size; Eyes: sclera white, no icterus, conjunctiva pink; Ears: Tm's gray and intact, normal light reflex; Nose: mucosa pink and moist, septum midline; Throat/Mouth: Teeth present, mucosa pink and moist, no exudate, lesions or ulcerations noted.  Neck:  Neck supple, trachea midline. No masses, lumps or thyromegaly present.  Cardiovascular: Normal rate and rhythm. S1,S2 noted.  No murmur, rubs or gallops noted. No JVD or BLE edema. No carotid bruits noted. Pulmonary/Chest: Normal effort and positive vesicular breath sounds. No respiratory distress. No wheezes, rales or ronchi noted.  Abdomen: Soft and nontender. Normal bowel sounds. No distention or masses noted. Liver, spleen and kidneys non palpable. Musculoskeletal:  5/5 Strength UE and LE bilaterally.  Right knee nontender to palpation.  Slight discomfort with McMurray test.  No instability or valgus/varus noted. Pt reports knee pain is located medially below joint line. No difficulty with gait. Neurological: Alert and oriented. Cranial nerves II-XII grossly intact. Coordination normal.  Psychiatric: Mood and affect normal. Behavior is normal. Judgment and thought content normal.    BMET    Component Value Date/Time   NA 143 03/03/2009 0853   K 5.1 03/03/2009 0853   CL 104 03/03/2009 0853   CO2 33* 03/03/2009 0853   GLUCOSE 77 03/03/2009 0853   BUN 22 03/03/2009 0853   CREATININE 2.0* 03/03/2009 0853   CALCIUM 9.7 03/03/2009 0853   GFRNONAA 39.80 03/03/2009 0853   GFRAA 39 11/04/2008 1133    Lipid Panel     Component Value Date/Time   CHOL 145 04/04/2010 0833   TRIG 56.0 04/04/2010 0833   HDL 55.80 04/04/2010 0833   CHOLHDL 3 04/04/2010 0833   VLDL 11.2 04/04/2010 0833   LDLCALC 78 04/04/2010 0833    CBC No results found for: WBC, RBC, HGB, HCT, PLT, MCV,  MCH, MCHC, RDW, LYMPHSABS, MONOABS, EOSABS, BASOSABS  Hgb A1C No results found for: HGBA1C       Assessment & Plan:   Health Maintenance:  Advised him to get a flu shot in the fall Tetanus UTD Encouraged him to continue balanced diet and exercise regime Continue routine vision and dental screening CBC, CMP, Lipid and HIV today  Fatigue:  TSH today  Gout:  Continue Allopurinol daily. Will check uric acid level today  HTN secondary to IgA Nephropathy:  Continue Benazapril. Follow-up with [facility].  Right Knee Pain:  Xray today, may need MRI for further evaluation Tylenol as  needed for pain  Will follow up after xray, RTC in 1 year for annual exam Webb Silversmith, NP

## 2016-05-03 NOTE — Patient Instructions (Signed)

## 2016-05-04 LAB — HIV ANTIBODY (ROUTINE TESTING W REFLEX): HIV 1&2 Ab, 4th Generation: NONREACTIVE

## 2016-07-17 ENCOUNTER — Encounter: Payer: Self-pay | Admitting: Vascular Surgery

## 2016-07-23 ENCOUNTER — Ambulatory Visit (INDEPENDENT_AMBULATORY_CARE_PROVIDER_SITE_OTHER): Payer: BLUE CROSS/BLUE SHIELD | Admitting: Vascular Surgery

## 2016-07-23 ENCOUNTER — Encounter: Payer: Self-pay | Admitting: Vascular Surgery

## 2016-07-23 VITALS — BP 111/66 | HR 61 | Temp 98.0°F | Resp 16 | Ht 77.0 in | Wt 253.0 lb

## 2016-07-23 DIAGNOSIS — I83892 Varicose veins of left lower extremities with other complications: Secondary | ICD-10-CM | POA: Diagnosis not present

## 2016-07-23 NOTE — Progress Notes (Signed)
Subjective:     Patient ID: Eric Douglas, male   DOB: [birth year], 45 y.o.   MRN: [id number]  HPI This 45 year old male employee of [employer] is evaluated today for painful varicosities in the left leg. This patient has a history of laser ablation of the right great saphenous vein with stab phlebectomy by me 10 years ago. He also has a remote history of a left great saphenous vein stripping 20+ years ago. When I evaluated him 10 years ago he had minimal residual varicosities in the left leg but over the past few years has developed large recurrent bulging varicosities which are quite painful in the left medial thigh and calf area. He does developed some swelling as the day progresses. He has no history of DVT thrombophlebitis stasis ulcers or bleeding. He does not wear long-leg elastic compression stockings actively at this time. Has no history of pulmonary embolus. He does not take anticoagulants. Symptoms are affecting his daily living and ability to work.  Past Medical History:  Diagnosis Date  . Fibula fracture    ORIF 1990  . Gout    non crystal proven   . History of chickenpox   . History of kidney disease   . HTN (hypertension)   . Nephropathy    Ig A; dx 2002. micropscopic hematuria and proteinuria  . Tibia fracture    ORIF 1990  . Varicocele    L testes  . Venous incompetence    R leg    Social History  Substance Use Topics  . Smoking status: Never Smoker  . Smokeless tobacco: Never Used  . Alcohol use 0.0 oz/week     Comment: occasional    Family History  Problem Relation Age of Onset  . Hypertension      GP  . Stroke    . Diabetes      DM - GP   . Cancer Neg Hx   . Heart disease Neg Hx     No Known Allergies   Current Outpatient Prescriptions:  .  allopurinol (ZYLOPRIM) 100 MG tablet, Take 1 tablet by mouth daily., Disp: , Rfl: 2 .  benazepril (LOTENSIN) 20 MG tablet, Take 0.5 tablets by mouth daily., Disp: , Rfl: 0  Vitals:   07/23/16 1307   BP: 111/66  Pulse: 61  Resp: 16  Temp: 98 F (36.7 C)  SpO2: 100%  Weight: 253 lb (114.8 kg)  Height: 6\' 5"  (1.956 m)    Body mass index is 30 kg/m.         Review of Systems Denies chest pain, dyspnea on exertion, PND, orthopnea, hemoptysis. Runs 2 miles 4-5 times per week. Does have history of IgA nephropathy, hypertension, and previous left ankle fracture requiring plating. Other systems negative and complete review of systems    Objective:   Physical Exam BP 111/66   Pulse 61   Temp 98 F (36.7 C)   Resp 16   Ht 6\' 5"  (1.956 m)   Wt 253 lb (114.8 kg)   SpO2 100%   BMI 30.00 kg/m     Gen.-alert and oriented x3 in no apparent distress HEENT normal for age Lungs no rhonchi or wheezing Cardiovascular regular rhythm no murmurs carotid pulses 3+ palpable no bruits audible Abdomen soft nontender no palpable masses Musculoskeletal free of  major deformities Skin clear -no rashes Neurologic normal Lower extremities 3+ femoral and dorsalis pedis pulses palpable bilaterally with no edema in right leg minimal edema left ankle Bulging varicosities  in left leg in the medial thigh and medial and posterior calf. No hyperpigmentation or ulceration noted.  Today I performed a bedside SonoSite ultrasound exam. The left great saphenous vein is quite large in caliber from the proximal calf up to the proximal thigh with apparent reflux.      Assessment:     #1 painful recurrent varicosities left leg-status post left great saphenous vein stripping 20+ years ago now with symptoms which are affecting patient's daily living and ability to work #2 IgA nephropathy #3 hypertension #4 history of left ankle fracture requiring open repair with plating    Plan:         #1 long leg elastic compression stockings 20-30 mm gradient #2 elevate legs as much as possible #3 ibuprofen daily on a regular basis for pain #4 return in 3 months-we'll obtain formal venous duplex exam left  leg prior to patient to return Will make formal recommendations at next visit but it appears that laser ablation of the left great saphenous vein from mid calf to proximal thigh plus greater than stab phlebectomy for painful varicosities may be indicated

## 2016-07-23 NOTE — Addendum Note (Signed)
Addended by: Kaleen Mask on: 07/23/2016 04:57 PM   Modules accepted: Orders

## 2016-10-22 DIAGNOSIS — N183 Chronic kidney disease, stage 3 (moderate): Secondary | ICD-10-CM | POA: Diagnosis not present

## 2016-10-25 ENCOUNTER — Encounter: Payer: Self-pay | Admitting: Vascular Surgery

## 2016-10-29 ENCOUNTER — Ambulatory Visit (INDEPENDENT_AMBULATORY_CARE_PROVIDER_SITE_OTHER): Payer: 59 | Admitting: Vascular Surgery

## 2016-10-29 ENCOUNTER — Encounter: Payer: Self-pay | Admitting: Vascular Surgery

## 2016-10-29 ENCOUNTER — Ambulatory Visit (HOSPITAL_COMMUNITY)
Admission: RE | Admit: 2016-10-29 | Discharge: 2016-10-29 | Disposition: A | Discharge status: Home / Self Care | Payer: 59 | Source: Ambulatory Visit | Attending: Vascular Surgery | Admitting: Vascular Surgery

## 2016-10-29 VITALS — BP 150/99 | HR 84 | Temp 97.8°F | Resp 16 | Ht 77.0 in | Wt 253.0 lb

## 2016-10-29 DIAGNOSIS — I83892 Varicose veins of left lower extremities with other complications: Secondary | ICD-10-CM

## 2016-10-29 NOTE — Progress Notes (Signed)
Subjective:     Patient ID: Eric Douglas, male   DOB: Jul 10, 1971, 46 y.o.   MRN: [id number]  HPI This 46 year old male returns for further follow-up regarding his painful varicosities in the left mid thigh. He has a remote history of left great saphenous vein stripping in [employer] 20 years ago. He is also had laser ablation of the right great saphenous vein. His elastic compression stockings make the legs feel better while later in place but as soon as they are removed he develops a heavy aching discomfort in the left medial thigh. He has no history of bleeding or ulceration or DVT.  Past Medical History:  Diagnosis Date  . Fibula fracture    ORIF 1990  . Gout    non crystal proven   . History of chickenpox   . History of kidney disease   . HTN (hypertension)   . Nephropathy    Ig A; dx 2002. micropscopic hematuria and proteinuria  . Tibia fracture    ORIF 1990  . Varicocele    L testes  . Venous incompetence    R leg    Social History  Substance Use Topics  . Smoking status: Never Smoker  . Smokeless tobacco: Never Used  . Alcohol use 0.0 oz/week     Comment: occasional    Family History  Problem Relation Age of Onset  . Hypertension      GP  . Stroke    . Diabetes      DM - GP   . Cancer Neg Hx   . Heart disease Neg Hx     No Known Allergies   Current Outpatient Prescriptions:  .  allopurinol (ZYLOPRIM) 100 MG tablet, Take 1 tablet by mouth daily., Disp: , Rfl: 2 .  benazepril (LOTENSIN) 20 MG tablet, Take 0.5 tablets by mouth daily., Disp: , Rfl: 0  Vitals:   10/29/16 1349  BP: (!) 150/99  Pulse: 84  Resp: 16  Temp: 97.8 F (36.6 C)  SpO2: 100%  Weight: 253 lb (114.8 kg)  Height: 6\' 5"  (1.956 m)    Body mass index is 30 kg/m.         Review of Systems Denies chest pain, dyspnea on exertion, PND, orthopnea, hemoptysis    Objective:   Physical Exam BP (!) 150/99   Pulse 84   Temp 97.8 F (36.6 C)   Resp 16   Ht 6\' 5"  (1.956  m)   Wt 253 lb (114.8 kg)   SpO2 100%   BMI 30.00 kg/m   Gen. well-developed well-nourished male no apparent distress alert and oriented 3 Lungs no rhonchi or wheezing Left leg with bulging varicosities in the proximal to mid medial thigh. No hyperpigmentation or ulceration noted distally. Slight swelling left leg compared to right. 3+ dorsalis pedis pulse palpable.  Today I ordered a venous duplex exam the left leg which I reviewed and interpreted. There is no DVT. 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 The caliber of the vein approaches 0.5 in diameter.     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

## 2016-12-11 ENCOUNTER — Encounter: Payer: Self-pay | Admitting: Vascular Surgery

## 2016-12-17 ENCOUNTER — Ambulatory Visit (INDEPENDENT_AMBULATORY_CARE_PROVIDER_SITE_OTHER): Payer: 59 | Admitting: Vascular Surgery

## 2016-12-17 ENCOUNTER — Encounter: Payer: Self-pay | Admitting: Vascular Surgery

## 2016-12-17 VITALS — BP 152/102 | HR 67 | Temp 97.5°F | Resp 16 | Ht 77.0 in | Wt 238.0 lb

## 2016-12-17 DIAGNOSIS — I83892 Varicose veins of left lower extremities with other complications: Secondary | ICD-10-CM | POA: Diagnosis not present

## 2016-12-17 HISTORY — PX: OTHER SURGICAL HISTORY: SHX169

## 2016-12-17 NOTE — Progress Notes (Signed)
    Stab Phlebectomy Procedure  Eric Douglas DOB:12-10-1970  12/17/2016  Consent signed: Yes  Surgeon:J.D. Kellie Simmering, MD  Procedure: stab phlebectomy: left leg  BP (!) 152/102 (BP Location: Right Arm, Patient Position: Sitting, Cuff Size: Normal)   Pulse 67   Temp 97.5 F (36.4 C) (Oral)   Resp 16   Ht 6\' 5"  (1.956 m)   Wt 238 lb (108 kg)   SpO2 99%   BMI 28.22 kg/m   Start time: 11:00am   End time: 11:50am   Tumescent Anesthesia: 225 cc 0.9% NaCl with 50 cc Lidocaine HCL with 1% Epi and 15 cc 8.4% NaHCO3  Local Anesthesia: 5 cc Lidocaine HCL and NaHCO3 (ratio 2:1)    Stab Phlebectomy: >20 incisions Sites: Thigh and Calf  Patient tolerated procedure well: Yes    Description of Procedure:  After marking the course of the secondary varicosities, the patient was placed on the operating table in the supine position, and the left leg was prepped and draped in sterile fashion.    The patient was then put into Trendelenburg position.  Local anesthetic was administered at the previously marked varicosities, and tumescent anesthesia was administered around the vessels.  Greater than 20 stab wounds were made using the tip of an 11 blade. And using the vein hook, the phlebectomies were performed using a hemostat to avulse the varicosities.  Adequate hemostasis was achieved, and steri strips were applied to the stab wound.      ABD pads and thigh high compression stockings were applied as well ace wraps where needed. Blood loss was less than 15 cc.  The patient ambulated out of the operating room having tolerated the procedure well.

## 2016-12-17 NOTE — Progress Notes (Signed)
Subjective:     Patient ID: [name], male   DOB: January 11, 1971, 46 y.o.   MRN: [id number]  HPI This 46 year old male had multiple stab phlebectomy-greater than 20-of painful varicosities left leg performed under local tumescent anesthesia. He tolerated the procedure well.  Review of Systems     Objective:   Physical Exam BP (!) 152/102 (BP Location: Right Arm, Patient Position: Sitting, Cuff Size: Normal)   Pulse 67   Temp 97.5 F (36.4 C) (Oral)   Resp 16   Ht 6\' 5"  (1.956 m)   Wt 238 lb (108 kg)   SpO2 99%   BMI 28.22 kg/m        Assessment:     Well-tolerated multiple stab phlebectomy painful varicosities left leg-greater than 20 performed under local tumescent anesthesia    Plan:     Return in 3 months for final follow-up

## 2017-02-01 DIAGNOSIS — M2142 Flat foot [pes planus] (acquired), left foot: Secondary | ICD-10-CM | POA: Diagnosis not present

## 2017-02-01 DIAGNOSIS — M2141 Flat foot [pes planus] (acquired), right foot: Secondary | ICD-10-CM | POA: Diagnosis not present

## 2017-02-01 DIAGNOSIS — R269 Unspecified abnormalities of gait and mobility: Secondary | ICD-10-CM | POA: Diagnosis not present

## 2017-02-11 DIAGNOSIS — I10 Essential (primary) hypertension: Secondary | ICD-10-CM | POA: Diagnosis not present

## 2017-02-11 DIAGNOSIS — N028 Recurrent and persistent hematuria with other morphologic changes: Secondary | ICD-10-CM | POA: Diagnosis not present

## 2017-02-11 DIAGNOSIS — N183 Chronic kidney disease, stage 3 (moderate): Secondary | ICD-10-CM | POA: Diagnosis not present

## 2017-02-11 DIAGNOSIS — M109 Gout, unspecified: Secondary | ICD-10-CM | POA: Diagnosis not present

## 2017-02-25 ENCOUNTER — Other Ambulatory Visit: Payer: Self-pay | Admitting: Nephrology

## 2017-02-25 DIAGNOSIS — Z01810 Encounter for preprocedural cardiovascular examination: Secondary | ICD-10-CM

## 2017-02-25 DIAGNOSIS — R93429 Abnormal radiologic findings on diagnostic imaging of unspecified kidney: Secondary | ICD-10-CM

## 2017-02-28 ENCOUNTER — Telehealth (HOSPITAL_COMMUNITY): Payer: Self-pay | Admitting: *Deleted

## 2017-02-28 NOTE — Telephone Encounter (Signed)
Patient given detailed instructions per Stress Test Requisition Sheet for test on 03/07/17 at 7:30.Patient Notified to arrive 30 minutes early, and that it is imperative to arrive on time for appointment to keep from having the test rescheduled.  Patient verbalized understanding. Eric Douglas

## 2017-03-05 ENCOUNTER — Other Ambulatory Visit: Payer: Self-pay

## 2017-03-05 ENCOUNTER — Ambulatory Visit (HOSPITAL_COMMUNITY): Payer: 59 | Attending: Cardiovascular Disease

## 2017-03-05 DIAGNOSIS — R93429 Abnormal radiologic findings on diagnostic imaging of unspecified kidney: Secondary | ICD-10-CM

## 2017-03-05 DIAGNOSIS — Z01810 Encounter for preprocedural cardiovascular examination: Secondary | ICD-10-CM | POA: Diagnosis not present

## 2017-03-05 DIAGNOSIS — I340 Nonrheumatic mitral (valve) insufficiency: Secondary | ICD-10-CM | POA: Insufficient documentation

## 2017-03-05 DIAGNOSIS — I10 Essential (primary) hypertension: Secondary | ICD-10-CM | POA: Insufficient documentation

## 2017-03-07 ENCOUNTER — Ambulatory Visit: Payer: 59

## 2017-03-07 ENCOUNTER — Ambulatory Visit (HOSPITAL_COMMUNITY): Payer: 59

## 2017-03-07 ENCOUNTER — Ambulatory Visit (HOSPITAL_COMMUNITY): Payer: 59 | Attending: Cardiovascular Disease

## 2017-03-07 DIAGNOSIS — I10 Essential (primary) hypertension: Secondary | ICD-10-CM | POA: Insufficient documentation

## 2017-03-07 DIAGNOSIS — R93429 Abnormal radiologic findings on diagnostic imaging of unspecified kidney: Secondary | ICD-10-CM

## 2017-03-07 DIAGNOSIS — Z01810 Encounter for preprocedural cardiovascular examination: Secondary | ICD-10-CM | POA: Diagnosis not present

## 2017-03-14 DIAGNOSIS — Z01818 Encounter for other preprocedural examination: Secondary | ICD-10-CM | POA: Diagnosis not present

## 2017-03-14 DIAGNOSIS — Z7682 Awaiting organ transplant status: Secondary | ICD-10-CM | POA: Diagnosis not present

## 2017-03-14 DIAGNOSIS — I10 Essential (primary) hypertension: Secondary | ICD-10-CM | POA: Diagnosis not present

## 2017-03-14 DIAGNOSIS — N028 Recurrent and persistent hematuria with other morphologic changes: Secondary | ICD-10-CM | POA: Diagnosis not present

## 2017-03-14 DIAGNOSIS — N189 Chronic kidney disease, unspecified: Secondary | ICD-10-CM | POA: Diagnosis not present

## 2017-03-14 DIAGNOSIS — Z713 Dietary counseling and surveillance: Secondary | ICD-10-CM | POA: Diagnosis not present

## 2017-03-14 DIAGNOSIS — R3129 Other microscopic hematuria: Secondary | ICD-10-CM | POA: Diagnosis not present

## 2017-03-14 DIAGNOSIS — I129 Hypertensive chronic kidney disease with stage 1 through stage 4 chronic kidney disease, or unspecified chronic kidney disease: Secondary | ICD-10-CM | POA: Diagnosis not present

## 2017-03-15 ENCOUNTER — Ambulatory Visit
Admission: RE | Admit: 2017-03-15 | Discharge: 2017-03-15 | Disposition: A | Discharge status: Home / Self Care | Payer: 59 | Source: Ambulatory Visit | Attending: Nephrology | Admitting: Nephrology

## 2017-03-15 DIAGNOSIS — Z01818 Encounter for other preprocedural examination: Secondary | ICD-10-CM | POA: Diagnosis not present

## 2017-03-15 DIAGNOSIS — R93429 Abnormal radiologic findings on diagnostic imaging of unspecified kidney: Secondary | ICD-10-CM

## 2017-03-15 DIAGNOSIS — Z01810 Encounter for preprocedural cardiovascular examination: Secondary | ICD-10-CM

## 2017-03-18 ENCOUNTER — Ambulatory Visit: Payer: 59 | Admitting: Vascular Surgery

## 2017-03-20 ENCOUNTER — Encounter: Payer: Self-pay | Admitting: Vascular Surgery

## 2017-03-22 DIAGNOSIS — Z7682 Awaiting organ transplant status: Secondary | ICD-10-CM | POA: Diagnosis not present

## 2017-03-25 ENCOUNTER — Encounter: Payer: Self-pay | Admitting: Vascular Surgery

## 2017-03-25 ENCOUNTER — Ambulatory Visit (INDEPENDENT_AMBULATORY_CARE_PROVIDER_SITE_OTHER): Payer: 59 | Admitting: Vascular Surgery

## 2017-03-25 VITALS — BP 136/93 | HR 82 | Temp 98.0°F | Resp 18 | Ht 77.0 in | Wt 238.0 lb

## 2017-03-25 DIAGNOSIS — I83892 Varicose veins of left lower extremities with other complications: Secondary | ICD-10-CM | POA: Diagnosis not present

## 2017-03-25 NOTE — Progress Notes (Signed)
Subjective:     Patient ID: Eric Douglas, male   DOB: 1971/07/25, 46 y.o.   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 He had remote history of vein stripping of the left great saphenous vein many years ago. He is also had laser ablation of the right great saphenous vein. He states his left leg feels great with no aching and throbbing discomfort as he was experiencing prior to this procedure. He has no complaints. He is having no distal edema and is not wearing elastic compression stocking.  Past Medical History:  Diagnosis Date  . Fibula fracture    ORIF 1990  . Gout    non crystal proven   . History of chickenpox   . History of kidney disease   . HTN (hypertension)   . Nephropathy    Ig A; dx 2002. micropscopic hematuria and proteinuria  . Tibia fracture    ORIF 1990  . Varicocele    L testes  . Venous incompetence    R leg    Social History  Substance Use Topics  . Smoking status: Never Smoker  . Smokeless tobacco: Never Used  . Alcohol use 0.0 oz/week     Comment: occasional    Family History  Problem Relation Age of Onset  . Hypertension Unknown        GP  . Stroke Unknown   . Diabetes Unknown        DM - GP   . Cancer Neg Hx   . Heart disease Neg Hx     No Known Allergies   Current Outpatient Prescriptions:  .  allopurinol (ZYLOPRIM) 100 MG tablet, Take 1 tablet by mouth daily., Disp: , Rfl: 2 .  amLODipine (NORVASC) 10 MG tablet, , Disp: , Rfl:  .  furosemide (LASIX) 40 MG tablet, , Disp: , Rfl:  .  benazepril (LOTENSIN) 20 MG tablet, Take 0.5 tablets by mouth daily., Disp: , Rfl: 0  Vitals:   03/25/17 1423  BP: (!) 136/93  Pulse: 82  Resp: 18  Temp: 98 F (36.7 C)  TempSrc: Oral  SpO2: 97%  Weight: 238 lb (108 kg)  Height: 6\' 5"  (1.956 m)    Body mass index is 28.22 kg/m.         Review of Systems He has chronic renal  insufficiency with IgA nephropathy and is awaiting renal transplant from [facility]. Denies chest pain, dyspnea on exertion, PND, orthopnea, claudication.    Objective:   Physical Exam BP (!) 136/93 (BP Location: Left Arm, Patient Position: Sitting, Cuff Size: Large)   Pulse 82   Temp 98 F (36.7 C) (Oral)   Resp 18   Ht 6\' 5"  (1.956 m)   Wt 238 lb (108 kg)   SpO2 97%   BMI 28.22 kg/m   Gen. well-developed well-nourished male no apparent stress alert and oriented 3 Lungs no rhonchi or wheezing Left leg is free of any bulging varicosities. No distal edema noted. Stab phlebectomy sites are well healed and difficult to visualize. 3+ dorsalis pedis pulse palpable.    Assessment:     Excellent early results following multiple stab phlebectomy of painful varicosities left leg-many years post-ligation and stripping left great saphenous vein-currently asymptomatic    Plan:     Return to see me on a when necessary basis

## 2017-03-27 DIAGNOSIS — Z005 Encounter for examination of potential donor of organ and tissue: Secondary | ICD-10-CM | POA: Diagnosis not present

## 2017-04-12 DIAGNOSIS — Z005 Encounter for examination of potential donor of organ and tissue: Secondary | ICD-10-CM | POA: Diagnosis not present

## 2017-04-18 DIAGNOSIS — N184 Chronic kidney disease, stage 4 (severe): Secondary | ICD-10-CM | POA: Diagnosis not present

## 2017-04-18 DIAGNOSIS — I10 Essential (primary) hypertension: Secondary | ICD-10-CM | POA: Diagnosis not present

## 2017-04-18 DIAGNOSIS — M109 Gout, unspecified: Secondary | ICD-10-CM | POA: Diagnosis not present

## 2017-04-18 DIAGNOSIS — N028 Recurrent and persistent hematuria with other morphologic changes: Secondary | ICD-10-CM | POA: Diagnosis not present

## 2017-05-22 DIAGNOSIS — M109 Gout, unspecified: Secondary | ICD-10-CM | POA: Diagnosis not present

## 2017-05-22 DIAGNOSIS — I10 Essential (primary) hypertension: Secondary | ICD-10-CM | POA: Diagnosis not present

## 2017-05-22 DIAGNOSIS — N028 Recurrent and persistent hematuria with other morphologic changes: Secondary | ICD-10-CM | POA: Diagnosis not present

## 2017-05-22 DIAGNOSIS — N184 Chronic kidney disease, stage 4 (severe): Secondary | ICD-10-CM | POA: Diagnosis not present

## 2017-06-05 ENCOUNTER — Encounter: Payer: Self-pay | Admitting: Internal Medicine

## 2017-06-05 ENCOUNTER — Ambulatory Visit (INDEPENDENT_AMBULATORY_CARE_PROVIDER_SITE_OTHER): Payer: 59 | Admitting: Internal Medicine

## 2017-06-05 VITALS — BP 134/86 | HR 58 | Temp 98.5°F | Ht 77.0 in | Wt 238.0 lb

## 2017-06-05 DIAGNOSIS — Z0001 Encounter for general adult medical examination with abnormal findings: Secondary | ICD-10-CM

## 2017-06-05 DIAGNOSIS — E349 Endocrine disorder, unspecified: Secondary | ICD-10-CM | POA: Diagnosis not present

## 2017-06-05 DIAGNOSIS — R350 Frequency of micturition: Secondary | ICD-10-CM | POA: Diagnosis not present

## 2017-06-05 DIAGNOSIS — R3911 Hesitancy of micturition: Secondary | ICD-10-CM | POA: Diagnosis not present

## 2017-06-05 NOTE — Progress Notes (Signed)
Subjective:    Patient ID: Eric Douglas, male    DOB: March 24, 1971, 46 y.o.   MRN: [id number]  HPI  Pt presents to the clinic today for his annual exam.  Flu: 07/2015 Tetanus: 11/2008 Vision Screening: annually Dentist: biannually  Diet: He does eat meat. He consumes fruits and veggies daily. He tries to avoid fried foods. He drinks mostly water. Exercise: He goes to the gym 4 days per week.  Review of Systems      Past Medical History:  Diagnosis Date  . Fibula fracture    ORIF 1990  . Gout    non crystal proven   . History of chickenpox   . History of kidney disease   . HTN (hypertension)   . Nephropathy    Ig A; dx 2002. micropscopic hematuria and proteinuria  . Tibia fracture    ORIF 1990  . Varicocele    L testes  . Venous incompetence    R leg    Current Outpatient Prescriptions  Medication Sig Dispense Refill  . allopurinol (ZYLOPRIM) 100 MG tablet Take 1 tablet by mouth daily.  2  . amLODipine (NORVASC) 10 MG tablet     . benazepril (LOTENSIN) 20 MG tablet Take 0.5 tablets by mouth daily.  0  . furosemide (LASIX) 40 MG tablet      No current facility-administered medications for this visit.     No Known Allergies  Family History  Problem Relation Age of Onset  . Hypertension Unknown        GP  . Stroke Unknown   . Diabetes Unknown        DM - GP   . Cancer Neg Hx   . Heart disease Neg Hx     Social History   Social History  . Marital status: Married    Spouse name: N/A  . Number of children: N/A  . Years of education: N/A   Occupational History  . Not on file.   Social History Main Topics  . Smoking status: Never Smoker  . Smokeless tobacco: Never Used  . Alcohol use 0.0 oz/week     Comment: occasional  . Drug use: No  . Sexual activity: Yes   Other Topics Concern  . Not on file   Social History Narrative   Divorced, primary custody of son; [name]; gets regular exercise.       Pt signed designated  party release and gives no access to medical records. Can leave msg on cell [phone].      Constitutional: Pt reports fatigue. Denies fever, malaise, headache or abrupt weight changes.  HEENT: Denies eye pain, eye redness, ear pain, ringing in the ears, wax buildup, runny nose, nasal congestion, bloody nose, or sore throat. Respiratory: Denies difficulty breathing, shortness of breath, cough or sputum production.   Cardiovascular: Denies chest pain, chest tightness, palpitations or swelling in the hands or feet.  Gastrointestinal: Denies abdominal pain, bloating, constipation, diarrhea or blood in the stool.  GU: Pt reports urinary frequency, urinary hesitancy. Denies urgency, pain with urination, burning sensation, blood in urine, odor or discharge. Musculoskeletal: Denies decrease in range of motion, difficulty with gait, muscle pain or joint pain and swelling.  Skin: Denies redness, rashes, lesions or ulcercations.  Neurological: Denies dizziness, difficulty with memory, difficulty with speech or problems with balance and coordination.  Psych: Denies anxiety, depression, SI/HI.  No other specific complaints in a complete review of systems (except as listed in HPI above).  Objective:   Physical Exam   BP 134/86   Pulse (!) 58   Temp 98.5 F (36.9 C) (Oral)   Ht 6\' 5"  (1.956 m)   Wt 238 lb (108 kg)   SpO2 97%   BMI 28.22 kg/m  Wt Readings from Last 3 Encounters:  06/05/17 238 lb (108 kg)  03/25/17 238 lb (108 kg)  12/17/16 238 lb (108 kg)    General: Appears her stated age, well developed, well nourished in NAD. Skin: Warm, dry and intact.  HEENT: Head: normal shape and size; Eyes: sclera white, no icterus, conjunctiva pink, PERRLA and EOMs intact; Ears: Tm's gray and intact, normal light reflex; Throat/Mouth: Teeth present, mucosa pink and moist, no exudate, lesions or ulcerations noted.  Neck:  Neck supple, trachea midline. No masses, lumps or thyromegaly present.    Cardiovascular: Normal rate and rhythm. S1,S2 noted. No murmur, rubs or gallops noted. No JVD or BLE edema. Pulmonary/Chest: Normal effort and positive vesicular breath sounds. No respiratory distress. No wheezes, rales or ronchi noted.  Abdomen: Soft and nontender. Normal bowel sounds. No distention or masses noted. Liver, spleen and kidneys non palpable. Musculoskeletal: Strength 5/5 BUE/BLE. No difficulty with gait.  Neurological: Alert and oriented. Cranial nerves II-XII grossly intact. Coordination normal.  Psychiatric: Mood and affect normal. Behavior is normal. Judgment and thought content normal.     BMET    Component Value Date/Time   NA 140 05/03/2016 0852   K 5.2 (H) 05/03/2016 0852   CL 109 05/03/2016 0852   CO2 23 05/03/2016 0852   GLUCOSE 93 05/03/2016 0852   BUN 65 (H) 05/03/2016 0852   CREATININE 2.95 (H) 05/03/2016 0852   CALCIUM 9.8 05/03/2016 0852   GFRNONAA 39.80 03/03/2009 0853   GFRAA 39 11/04/2008 1133    Lipid Panel     Component Value Date/Time   CHOL 122 05/03/2016 0852   TRIG 62.0 05/03/2016 0852   HDL 41.90 05/03/2016 0852   CHOLHDL 3 05/03/2016 0852   VLDL 12.4 05/03/2016 0852   LDLCALC 68 05/03/2016 0852    CBC    Component Value Date/Time   WBC 4.9 05/03/2016 0852   RBC 4.22 05/03/2016 0852   HGB 12.8 (L) 05/03/2016 0852   HCT 38.7 (L) 05/03/2016 0852   PLT 185.0 05/03/2016 0852   MCV 91.7 05/03/2016 0852   MCHC 33.2 05/03/2016 0852   RDW 13.2 05/03/2016 0852    Hgb A1C No results found for: HGBA1C         Assessment & Plan:   Preventative Health Maintenance:  Encouraged him to get a flu shot in the fall Tetanus UTD Encouraged him to consume a balanced diet and exercise regimen Advised him to see an eye doctor and dentist annually Will check CBC, CMET, Lipid and A1C  Fatigue, Low Testosterone:  Will check testosterone level today  Urinary Frequency, Hesitancy:  Will check PSA  RTC in 1 year, sooner if  needed Webb Silversmith, NP

## 2017-06-05 NOTE — Patient Instructions (Signed)

## 2017-06-07 ENCOUNTER — Other Ambulatory Visit (INDEPENDENT_AMBULATORY_CARE_PROVIDER_SITE_OTHER): Payer: 59

## 2017-06-07 ENCOUNTER — Other Ambulatory Visit: Payer: Self-pay

## 2017-06-07 ENCOUNTER — Telehealth: Payer: Self-pay

## 2017-06-07 ENCOUNTER — Other Ambulatory Visit: Payer: Self-pay | Admitting: Internal Medicine

## 2017-06-07 DIAGNOSIS — R3911 Hesitancy of micturition: Secondary | ICD-10-CM

## 2017-06-07 DIAGNOSIS — R350 Frequency of micturition: Secondary | ICD-10-CM

## 2017-06-07 DIAGNOSIS — E349 Endocrine disorder, unspecified: Secondary | ICD-10-CM

## 2017-06-07 DIAGNOSIS — Z0001 Encounter for general adult medical examination with abnormal findings: Secondary | ICD-10-CM

## 2017-06-07 LAB — COMPREHENSIVE METABOLIC PANEL
ALBUMIN: 4.3 g/dL (ref 3.5–5.2)
ALK PHOS: 71 U/L (ref 39–117)
ALT: 49 U/L (ref 0–53)
AST: 33 U/L (ref 0–37)
BUN: 116 mg/dL (ref 6–23)
CO2: 24 mEq/L (ref 19–32)
Calcium: 9.5 mg/dL (ref 8.4–10.5)
Chloride: 106 mEq/L (ref 96–112)
Creatinine, Ser: 6.2 mg/dL (ref 0.40–1.50)
GFR: 10.37 mL/min — AB (ref >60.00)
Glucose, Bld: 93 mg/dL (ref 70–99)
POTASSIUM: 5 meq/L (ref 3.5–5.1)
Sodium: 140 mEq/L (ref 135–145)
TOTAL PROTEIN: 6.7 g/dL (ref 6.0–8.3)
Total Bilirubin: 0.4 mg/dL (ref 0.2–1.2)

## 2017-06-07 LAB — LIPID PANEL
CHOLESTEROL: 114 mg/dL (ref 0–200)
HDL: 29.9 mg/dL — AB (ref >39.00)
LDL Cholesterol: 65 mg/dL (ref 0–99)
NonHDL: 84.44
Total CHOL/HDL Ratio: 4
Triglycerides: 99 mg/dL (ref 0.0–149.0)
VLDL: 19.8 mg/dL (ref 0.0–40.0)

## 2017-06-07 LAB — CBC
HEMATOCRIT: 36.6 % — AB (ref 39.0–52.0)
HEMOGLOBIN: 12.4 g/dL — AB (ref 13.0–17.0)
MCHC: 33.9 g/dL (ref 30.0–36.0)
MCV: 91.1 fl (ref 78.0–100.0)
Platelets: 226 10*3/uL (ref 150.0–400.0)
RBC: 4.02 Mil/uL — AB (ref 4.22–5.81)
RDW: 13 % (ref 11.5–15.5)
WBC: 6.6 10*3/uL (ref 4.0–10.5)

## 2017-06-07 LAB — HEMOGLOBIN A1C: Hgb A1c MFr Bld: 5.1 % (ref 4.6–6.5)

## 2017-06-07 LAB — PSA: PSA: 0.78 ng/mL (ref 0.10–4.00)

## 2017-06-07 LAB — TESTOSTERONE: Testosterone: 1089.28 ng/dL — ABNORMAL HIGH (ref 300.00–890.00)

## 2017-06-07 MED ORDER — TESTOSTERONE CYPIONATE 200 MG/ML IM SOLN: 150.0000 mg | INTRAMUSCULAR | 0 refills | Status: DC

## 2017-06-07 MED ORDER — TESTOSTERONE CYPIONATE 200 MG/ML IM SOLN: 200.0000 mg | INTRAMUSCULAR | 0 refills | Status: DC

## 2017-06-07 NOTE — Telephone Encounter (Signed)
Received critical results on [facility] from [facility].  BUN= 116.00 CREATININE= 6.20 GFR= 10.37 Verbally notified Dr. Diona Browner

## 2017-06-07 NOTE — Telephone Encounter (Signed)
noted 

## 2017-06-07 NOTE — Telephone Encounter (Signed)
Rx faxed per [facility]

## 2017-06-07 NOTE — Telephone Encounter (Signed)
Pt recently saw [facility] for IgA nephropathy LASt Cr 6.07.  Pt not uremic per their note.. Not on dialyisis, but stable Cr. Awaiting kidney transplant.   No treatment needed.

## 2017-06-10 ENCOUNTER — Telehealth: Payer: Self-pay | Admitting: Internal Medicine

## 2017-06-10 DIAGNOSIS — Z524 Kidney donor: Secondary | ICD-10-CM | POA: Diagnosis not present

## 2017-06-10 NOTE — Telephone Encounter (Signed)
I refax[facility] to [facility]

## 2017-06-10 NOTE — Telephone Encounter (Signed)
Pt [facility] on his rx  [facility] [place] [facility] Pt wants to know where this was faxed to  Best number [phone]

## 2017-06-10 NOTE — Telephone Encounter (Signed)
Pt is aware that Rx has been fax[facility] to [facility] again... I faxed twice this morning

## 2017-06-11 DIAGNOSIS — Z524 Kidney donor: Secondary | ICD-10-CM | POA: Diagnosis not present

## 2017-06-11 DIAGNOSIS — E039 Hypothyroidism, unspecified: Secondary | ICD-10-CM | POA: Diagnosis not present

## 2017-06-11 DIAGNOSIS — Z005 Encounter for examination of potential donor of organ and tissue: Secondary | ICD-10-CM | POA: Diagnosis not present

## 2017-06-11 DIAGNOSIS — R69 Illness, unspecified: Secondary | ICD-10-CM | POA: Diagnosis not present

## 2017-06-12 ENCOUNTER — Other Ambulatory Visit: Payer: Self-pay

## 2017-06-12 DIAGNOSIS — N185 Chronic kidney disease, stage 5: Secondary | ICD-10-CM

## 2017-06-18 DIAGNOSIS — Z7682 Awaiting organ transplant status: Secondary | ICD-10-CM | POA: Diagnosis not present

## 2017-06-18 DIAGNOSIS — Z01818 Encounter for other preprocedural examination: Secondary | ICD-10-CM | POA: Diagnosis not present

## 2017-06-21 ENCOUNTER — Ambulatory Visit (HOSPITAL_COMMUNITY)
Admission: RE | Admit: 2017-06-21 | Discharge: 2017-06-21 | Disposition: A | Discharge status: Home / Self Care | Payer: 59 | Source: Ambulatory Visit | Attending: Vascular Surgery | Admitting: Vascular Surgery

## 2017-06-21 ENCOUNTER — Ambulatory Visit (INDEPENDENT_AMBULATORY_CARE_PROVIDER_SITE_OTHER): Payer: 59 | Admitting: Vascular Surgery

## 2017-06-21 ENCOUNTER — Ambulatory Visit (INDEPENDENT_AMBULATORY_CARE_PROVIDER_SITE_OTHER)
Admission: RE | Admit: 2017-06-21 | Discharge: 2017-06-21 | Disposition: A | Discharge status: Home / Self Care | Payer: 59 | Source: Ambulatory Visit | Attending: Vascular Surgery | Admitting: Vascular Surgery

## 2017-06-21 ENCOUNTER — Encounter: Payer: Self-pay | Admitting: Vascular Surgery

## 2017-06-21 VITALS — BP 118/78 | HR 54 | Temp 97.3°F | Resp 16 | Ht 77.0 in | Wt 262.0 lb

## 2017-06-21 DIAGNOSIS — N184 Chronic kidney disease, stage 4 (severe): Secondary | ICD-10-CM | POA: Diagnosis not present

## 2017-06-21 DIAGNOSIS — Z48812 Encounter for surgical aftercare following surgery on the circulatory system: Secondary | ICD-10-CM | POA: Diagnosis not present

## 2017-06-21 DIAGNOSIS — N185 Chronic kidney disease, stage 5: Secondary | ICD-10-CM | POA: Diagnosis not present

## 2017-06-21 NOTE — Progress Notes (Signed)
Patient ID: Eric Douglas, male   DOB: 04-22-1971, 46 y.o.   MRN: [id number]  Reason for Consult: Chronic Kidney Disease (CKD stage 5 -Eval for AVF.  Dr. Moshe Cipro [phone]. )   Referred by Jearld Fenton, NP  Subjective:     HPI:  Eric Douglas is a 46 y.o. male with IgA nephropathy as well as hypertension now with severely depressed GFR. He has never had dialysis before. He does not have surgery on other views upper extremities. Vein mapping performed prior to today's visit. He is not on blood thinners.  Past Medical History:  Diagnosis Date  . Fibula fracture    ORIF 1990  . Gout    non crystal proven   . History of chickenpox   . History of kidney disease   . HTN (hypertension)   . Nephropathy    Ig A; dx 2002. micropscopic hematuria and proteinuria  . Tibia fracture    ORIF 1990  . Varicocele    L testes  . Venous incompetence    R leg   Family History  Problem Relation Age of Onset  . Hypertension Unknown        GP  . Stroke Unknown   . Diabetes Unknown        DM - GP   . Cancer Neg Hx   . Heart disease Neg Hx    Past Surgical History:  Procedure Laterality Date  . APPENDECTOMY  1982  . stab phlebectomy Left 12/17/2016   stab phlebectomy >20 incisions (left leg) by Tinnie Gens MD     Short Social History:  Social History  Substance Use Topics  . Smoking status: Never Smoker  . Smokeless tobacco: Never Used  . Alcohol use No    No Known Allergies  Current Outpatient Prescriptions  Medication Sig Dispense Refill  . allopurinol (ZYLOPRIM) 100 MG tablet Take 1 tablet by mouth daily.  2  . amLODipine (NORVASC) 10 MG tablet Take 10 mg by mouth daily.     Marland Kitchen BYSTOLIC 10 MG tablet Take 10 mg by mouth daily.     . furosemide (LASIX) 40 MG tablet Take 40 mg by mouth daily.     Marland Kitchen testosterone cypionate (DEPOTESTOSTERONE CYPIONATE) 200 MG/ML injection Inject 1 mL (200 mg total) into the muscle every 14 (fourteen) days. 10 mL 0   No  current facility-administered medications for this visit.     Review of Systems  Constitutional:  Constitutional negative. HENT: HENT negative.  Eyes: Eyes negative.  Respiratory: Respiratory negative.  Cardiovascular: Cardiovascular negative.  GI: Gastrointestinal negative.  Musculoskeletal: Musculoskeletal negative.  Skin: Skin negative.  Neurological: Neurological negative. Hematologic: Hematologic/lymphatic negative.  Psychiatric: Psychiatric negative.        Objective:  Objective   Vitals:   06/21/17 1213  BP: 118/78  Pulse: (!) 54  Resp: 16  Temp: (!) 97.3 F (36.3 C)  TempSrc: Oral  SpO2: 99%  Weight: 262 lb (118.8 kg)  Height: 6\' 5"  (1.956 m)   Body mass index is 31.07 kg/m.  Physical Exam  Constitutional: He is oriented to person, place, and time. He appears well-developed.  HENT:  Head: Normocephalic.  Eyes: Pupils are equal, round, and reactive to light.  Neck: Normal range of motion.  Cardiovascular: Normal rate.   Pulses:      Radial pulses are 2+ on the right side, and 2+ on the left side.  Pulmonary/Chest: Effort normal.  Abdominal: Soft. He exhibits no mass.  Musculoskeletal:  Normal range of motion. He exhibits no edema.  Neurological: He is alert and oriented to person, place, and time.  Skin: Skin is warm and dry.  Psychiatric: He has a normal mood and affect. His behavior is normal. Judgment and thought content normal.    Data: I have independently interpreted his upper extremity vein mapping and he has suitable cephalic and basilic veins bilaterally for fistula creation. He also has triphasic waveforms in his arteries in his bilateral upper extremities.     Assessment/Plan:     46 year old male with impending dialysis here for evaluation of access placement. He does have suitable vein on ultrasound and what appears to be suitable artery. I discussed risks benefits alternatives to proceeding with fistula creation as well as the options of  catheter, graft, fistula. We will start with his nondominant left arm as distally as possible with likely brachiocephalic fistula in the near future. He demonstrates good understanding and we'll get him scheduled today. Should he come to dialysis in the interim he will need a catheter at the time of operation.     Waynetta Sandy MD [facility]

## 2017-06-24 ENCOUNTER — Other Ambulatory Visit: Payer: Self-pay

## 2017-06-26 ENCOUNTER — Encounter (HOSPITAL_COMMUNITY): Payer: Self-pay | Admitting: *Deleted

## 2017-06-26 NOTE — Progress Notes (Signed)
Pt denies SOB, chest pain, and being under the care of a cardiologist. Pt denies having a cardiac acth. Pt made aware to stop taking vitamins, fish oil and herbal medications. Do not take any NSAIDs ie: Ibuprofen, Advil, Naproxen (Aleve), Motrin, BC and Goody Powder. Pt verbalized understanding of all pre-op instructions.

## 2017-06-27 ENCOUNTER — Ambulatory Visit (HOSPITAL_COMMUNITY): Payer: 59 | Admitting: Anesthesiology

## 2017-06-27 ENCOUNTER — Encounter (HOSPITAL_COMMUNITY): Payer: Self-pay

## 2017-06-27 ENCOUNTER — Encounter (HOSPITAL_COMMUNITY)
Admission: RE | Disposition: A | Discharge status: Home / Self Care | Payer: Self-pay | Source: Ambulatory Visit | Attending: Vascular Surgery

## 2017-06-27 ENCOUNTER — Ambulatory Visit (HOSPITAL_COMMUNITY)
Admission: RE | Admit: 2017-06-27 | Discharge: 2017-06-27 | Disposition: A | Discharge status: Home / Self Care | Payer: 59 | Source: Ambulatory Visit | Attending: Vascular Surgery | Admitting: Vascular Surgery

## 2017-06-27 ENCOUNTER — Encounter (HOSPITAL_COMMUNITY): Payer: 59

## 2017-06-27 ENCOUNTER — Other Ambulatory Visit (HOSPITAL_COMMUNITY): Payer: 59

## 2017-06-27 ENCOUNTER — Encounter: Payer: 59 | Admitting: Vascular Surgery

## 2017-06-27 DIAGNOSIS — N186 End stage renal disease: Secondary | ICD-10-CM | POA: Diagnosis not present

## 2017-06-27 DIAGNOSIS — E669 Obesity, unspecified: Secondary | ICD-10-CM | POA: Insufficient documentation

## 2017-06-27 DIAGNOSIS — I120 Hypertensive chronic kidney disease with stage 5 chronic kidney disease or end stage renal disease: Secondary | ICD-10-CM | POA: Insufficient documentation

## 2017-06-27 DIAGNOSIS — I151 Hypertension secondary to other renal disorders: Secondary | ICD-10-CM | POA: Diagnosis not present

## 2017-06-27 DIAGNOSIS — N185 Chronic kidney disease, stage 5: Secondary | ICD-10-CM | POA: Diagnosis not present

## 2017-06-27 DIAGNOSIS — D631 Anemia in chronic kidney disease: Secondary | ICD-10-CM | POA: Diagnosis not present

## 2017-06-27 DIAGNOSIS — Z6831 Body mass index (BMI) 31.0-31.9, adult: Secondary | ICD-10-CM | POA: Insufficient documentation

## 2017-06-27 DIAGNOSIS — M109 Gout, unspecified: Secondary | ICD-10-CM | POA: Diagnosis not present

## 2017-06-27 DIAGNOSIS — N184 Chronic kidney disease, stage 4 (severe): Secondary | ICD-10-CM | POA: Diagnosis not present

## 2017-06-27 DIAGNOSIS — I83892 Varicose veins of left lower extremities with other complications: Secondary | ICD-10-CM | POA: Diagnosis not present

## 2017-06-27 HISTORY — PX: AV FISTULA PLACEMENT: SHX1204

## 2017-06-27 LAB — POCT I-STAT 4, (NA,K, GLUC, HGB,HCT)
GLUCOSE: 91 mg/dL (ref 65–99)
HEMATOCRIT: 35 % — AB (ref 39.0–52.0)
HEMOGLOBIN: 11.9 g/dL — AB (ref 13.0–17.0)
POTASSIUM: 5.6 mmol/L — AB (ref 3.5–5.1)
SODIUM: 139 mmol/L (ref 135–145)

## 2017-06-27 SURGERY — ARTERIOVENOUS (AV) FISTULA CREATION
Anesthesia: Monitor Anesthesia Care | Site: Arm Lower | Laterality: Left

## 2017-06-27 MED ORDER — MIDAZOLAM HCL 2 MG/2ML IJ SOLN
INTRAMUSCULAR | Status: AC
  Filled 2017-06-27: qty 2

## 2017-06-27 MED ORDER — PROPOFOL 10 MG/ML IV BOLUS
INTRAVENOUS | Status: DC | PRN
  Administered 2017-06-27: 50 mg via INTRAVENOUS

## 2017-06-27 MED ORDER — ONDANSETRON HCL 4 MG/2ML IJ SOLN
INTRAMUSCULAR | Status: DC | PRN
Start: 2017-06-27 — End: 2017-06-27
  Administered 2017-06-27: 4 mg via INTRAVENOUS

## 2017-06-27 MED ORDER — ONDANSETRON HCL 4 MG/2ML IJ SOLN: 4.0000 mg | Freq: Once | INTRAMUSCULAR | Status: DC | PRN

## 2017-06-27 MED ORDER — PROPOFOL 10 MG/ML IV BOLUS
INTRAVENOUS | Status: AC
  Filled 2017-06-27: qty 20

## 2017-06-27 MED ORDER — 0.9 % SODIUM CHLORIDE (POUR BTL) OPTIME
TOPICAL | Status: DC | PRN
  Administered 2017-06-27: 1000 mL

## 2017-06-27 MED ORDER — LIDOCAINE 2% (20 MG/ML) 5 ML SYRINGE
INTRAMUSCULAR | Status: AC
  Filled 2017-06-27: qty 5

## 2017-06-27 MED ORDER — PROPOFOL 500 MG/50ML IV EMUL
INTRAVENOUS | Status: DC | PRN
  Administered 2017-06-27: 175 ug/kg/min via INTRAVENOUS
  Administered 2017-06-27: 200 ug/kg/min via INTRAVENOUS

## 2017-06-27 MED ORDER — PHENYLEPHRINE 40 MCG/ML (10ML) SYRINGE FOR IV PUSH (FOR BLOOD PRESSURE SUPPORT)
PREFILLED_SYRINGE | INTRAVENOUS | Status: DC | PRN
  Administered 2017-06-27 (×3): 80 ug via INTRAVENOUS

## 2017-06-27 MED ORDER — EPHEDRINE SULFATE-NACL 50-0.9 MG/10ML-% IV SOSY
PREFILLED_SYRINGE | INTRAVENOUS | Status: DC | PRN
Start: 2017-06-27 — End: 2017-06-27
  Administered 2017-06-27: 10 mg via INTRAVENOUS

## 2017-06-27 MED ORDER — DEXTROSE 5 % IV SOLN
1.5000 g | INTRAVENOUS | Status: AC
  Administered 2017-06-27: 1.5 g via INTRAVENOUS
  Filled 2017-06-27: qty 1.5

## 2017-06-27 MED ORDER — PROPOFOL 1000 MG/100ML IV EMUL
INTRAVENOUS | Status: AC
  Filled 2017-06-27: qty 100

## 2017-06-27 MED ORDER — SODIUM CHLORIDE 0.9 % IV SOLN
INTRAVENOUS | Status: DC | PRN
  Administered 2017-06-27: 500 mL

## 2017-06-27 MED ORDER — OXYCODONE-ACETAMINOPHEN 5-325 MG PO TABS: 1.0000 | ORAL_TABLET | Freq: Four times a day (QID) | ORAL | 0 refills | Status: DC | PRN

## 2017-06-27 MED ORDER — CHLORHEXIDINE GLUCONATE CLOTH 2 % EX PADS: 6.0000 | MEDICATED_PAD | Freq: Once | CUTANEOUS | Status: DC

## 2017-06-27 MED ORDER — LIDOCAINE-EPINEPHRINE (PF) 1 %-1:200000 IJ SOLN
INTRAMUSCULAR | Status: AC
  Filled 2017-06-27: qty 30

## 2017-06-27 MED ORDER — DEXAMETHASONE SODIUM PHOSPHATE 10 MG/ML IJ SOLN
INTRAMUSCULAR | Status: AC
  Filled 2017-06-27: qty 1

## 2017-06-27 MED ORDER — KETAMINE HCL 10 MG/ML IJ SOLN
INTRAMUSCULAR | Status: DC | PRN
  Administered 2017-06-27: 20 mg via INTRAVENOUS

## 2017-06-27 MED ORDER — SODIUM CHLORIDE 0.9 % IV SOLN
INTRAVENOUS | Status: DC
  Administered 2017-06-27: 14:00:00 via INTRAVENOUS

## 2017-06-27 MED ORDER — ONDANSETRON HCL 4 MG/2ML IJ SOLN
INTRAMUSCULAR | Status: AC
  Filled 2017-06-27: qty 2

## 2017-06-27 MED ORDER — FENTANYL CITRATE (PF) 250 MCG/5ML IJ SOLN
INTRAMUSCULAR | Status: DC | PRN
  Administered 2017-06-27: 100 ug via INTRAVENOUS
  Administered 2017-06-27 (×2): 25 ug via INTRAVENOUS

## 2017-06-27 MED ORDER — FENTANYL CITRATE (PF) 250 MCG/5ML IJ SOLN
INTRAMUSCULAR | Status: AC
  Filled 2017-06-27: qty 5

## 2017-06-27 MED ORDER — LIDOCAINE HCL (CARDIAC) 20 MG/ML IV SOLN
INTRAVENOUS | Status: DC | PRN
  Administered 2017-06-27: 80 mg via INTRAVENOUS

## 2017-06-27 MED ORDER — FENTANYL CITRATE (PF) 100 MCG/2ML IJ SOLN
25.0000 ug | INTRAMUSCULAR | Status: DC | PRN
Start: 2017-06-27 — End: 2017-06-27

## 2017-06-27 MED ORDER — MIDAZOLAM HCL 2 MG/2ML IJ SOLN
INTRAMUSCULAR | Status: DC | PRN
  Administered 2017-06-27 (×2): 2 mg via INTRAVENOUS

## 2017-06-27 MED ORDER — LIDOCAINE-EPINEPHRINE (PF) 1 %-1:200000 IJ SOLN
INTRAMUSCULAR | Status: DC | PRN
  Administered 2017-06-27: 10 mL

## 2017-06-27 MED ORDER — KETAMINE HCL-SODIUM CHLORIDE 100-0.9 MG/10ML-% IV SOSY
PREFILLED_SYRINGE | INTRAVENOUS | Status: AC
  Filled 2017-06-27: qty 10

## 2017-06-27 MED ORDER — PHENYLEPHRINE 40 MCG/ML (10ML) SYRINGE FOR IV PUSH (FOR BLOOD PRESSURE SUPPORT)
PREFILLED_SYRINGE | INTRAVENOUS | Status: AC
  Filled 2017-06-27: qty 10

## 2017-06-27 SURGICAL SUPPLY — 31 items
ADH SKN CLS APL DERMABOND .7 (GAUZE/BANDAGES/DRESSINGS) ×1
ARMBAND PINK RESTRICT EXTREMIT (MISCELLANEOUS) ×2 IMPLANT
CANISTER SUCT 3000ML PPV (MISCELLANEOUS) ×2 IMPLANT
CLIP VESOCCLUDE MED 6/CT (CLIP) ×2 IMPLANT
CLIP VESOCCLUDE SM WIDE 6/CT (CLIP) ×2 IMPLANT
COVER PROBE W GEL 5X96 (DRAPES) IMPLANT
DERMABOND ADVANCED (GAUZE/BANDAGES/DRESSINGS) ×1
DERMABOND ADVANCED .7 DNX12 (GAUZE/BANDAGES/DRESSINGS) ×1 IMPLANT
ELECT REM PT RETURN 9FT ADLT (ELECTROSURGICAL) ×2
ELECTRODE REM PT RTRN 9FT ADLT (ELECTROSURGICAL) ×1 IMPLANT
GLOVE BIO SURGEON STRL SZ7.5 (GLOVE) ×2 IMPLANT
GLOVE BIOGEL PI IND STRL 6 (GLOVE) IMPLANT
GLOVE BIOGEL PI IND STRL 6.5 (GLOVE) IMPLANT
GLOVE BIOGEL PI INDICATOR 6 (GLOVE) ×1
GLOVE BIOGEL PI INDICATOR 6.5 (GLOVE) ×1
GLOVE SURG SS PI 7.0 STRL IVOR (GLOVE) ×2 IMPLANT
GOWN STRL REUS W/ TWL LRG LVL3 (GOWN DISPOSABLE) ×2 IMPLANT
GOWN STRL REUS W/ TWL XL LVL3 (GOWN DISPOSABLE) ×1 IMPLANT
GOWN STRL REUS W/TWL LRG LVL3 (GOWN DISPOSABLE) ×2
GOWN STRL REUS W/TWL XL LVL3 (GOWN DISPOSABLE) ×4
KIT BASIN OR (CUSTOM PROCEDURE TRAY) ×2 IMPLANT
KIT ROOM TURNOVER OR (KITS) ×2 IMPLANT
NS IRRIG 1000ML POUR BTL (IV SOLUTION) ×2 IMPLANT
PACK CV ACCESS (CUSTOM PROCEDURE TRAY) ×2 IMPLANT
PAD ARMBOARD 7.5X6 YLW CONV (MISCELLANEOUS) ×4 IMPLANT
SUT MNCRL AB 4-0 PS2 18 (SUTURE) ×2 IMPLANT
SUT PROLENE 6 0 BV (SUTURE) ×2 IMPLANT
SUT VIC AB 3-0 SH 27 (SUTURE) ×2
SUT VIC AB 3-0 SH 27X BRD (SUTURE) ×1 IMPLANT
UNDERPAD 30X30 (UNDERPADS AND DIAPERS) ×2 IMPLANT
WATER STERILE IRR 1000ML POUR (IV SOLUTION) ×2 IMPLANT

## 2017-06-27 NOTE — Op Note (Signed)
    Patient name: Eric Douglas MRN: [id number] DOB: [birth year] Sex: male  06/27/2017 Pre-operative Diagnosis: ckd Post-operative diagnosis:  Same Surgeon:  Erlene Quan C. Donzetta Matters, MD Assistant: OR nurse Procedure Performed: Left radiocephalic arteriovenous fistula creation  Indications:  46 year old male with IgA nephropathy now with GFR less than 10 and indicated for hemodialysis access.  Findings: Cephalic vein on the left dilated to 4 mm. Radial artery was just over 2 mm external diameter. At completion there was pulsatility in the runoff vein and signal in the radial artery beyond the fistula augmented with compression of the fistula.   Procedure:  The patient was identified in the holding area and taken to the operating room where his placed supine on the operating table and Mac anesthesia was induced. He was sterilely prepped and draped in the left arm given antibiotics timeout called. We began with marking the palpable pulse as well as the cephalic vein at the wrist. We then instilled 10 mL 1% lidocaine with epinephrine for local block. Longitudinal incision was made and we dissected down through skin and subcutaneous tissue identified our cephalic vein. This was traced distally heart for orientation. We then dissected out our radial artery. Cephalic was transected distally and spatulated dilated up to 4 mm. The artery was clamped distally and proximally opened longitudinally and flushed in both directions. Vein was then sewn into side with 6-0 Prolene suture. Prior to completing anastomosis we allowed flushing from all directions. At completion there is palpable pulsatility we dissected out the vein further to allow to sit without kinking and that it did have a weak thrill was pulsatility up the arm with the vein. This was all confirmed with Doppler and there was also radial artery multiphasic signal beyond the fistula that did increase with compression of the vein. Satisfied with this we did  irrigate the wound and then closed in layers with 3-0 Vicryl for Monocryl. Dermabond was placed to the level of the skin. Patient tolerated procedure well without immediate complication.    Grae Cannata C. Donzetta Matters, MD [facility] [place] Office: [phone] Pager: [phone]

## 2017-06-27 NOTE — Transfer of Care (Signed)
Immediate Anesthesia Transfer of Care Note  Patient: Eric Douglas  Procedure(s) Performed: Procedure(s): LEFT ARM ARTERIOVENOUS (AV) FISTULA CREATION (Left)  Patient Location: PACU  Anesthesia Type:MAC  Level of Consciousness: awake, alert  and oriented  Airway & Oxygen Therapy: Patient Spontanous Breathing and Patient connected to face mask oxygen  Post-op Assessment: Report given to RN and Post -op Vital signs reviewed and stable  Post vital signs: Reviewed and stable  Last Vitals:  Vitals:   06/27/17 0831 06/27/17 1446  BP: 137/75   Pulse: (!) 53   Resp: 18   Temp: 36.4 C 36.6 C  SpO2: 100%     Last Pain:  Vitals:   06/27/17 1446  TempSrc:   PainSc: 0-No pain      Patients Stated Pain Goal: 4 (78/24/23 5361)  Complications: No apparent anesthesia complications

## 2017-06-27 NOTE — Anesthesia Procedure Notes (Signed)
Procedure Name: MAC Date/Time: 06/27/2017 2:04 PM Performed by: Teressa Lower Pre-anesthesia Checklist: Emergency Drugs available, Suction available, Patient identified, Patient being monitored and Timeout performed Oxygen Delivery Method: Simple face mask Ventilation: Oral airway inserted - appropriate to patient size

## 2017-06-27 NOTE — Anesthesia Postprocedure Evaluation (Signed)
Anesthesia Post Note  Patient: Eric Douglas  Procedure(s) Performed: Procedure(s) (LRB): LEFT ARM ARTERIOVENOUS (AV) FISTULA CREATION (Left)     Patient location during evaluation: PACU Anesthesia Type: MAC Level of consciousness: awake and alert Pain management: pain level controlled Vital Signs Assessment: post-procedure vital signs reviewed and stable Respiratory status: spontaneous breathing, nonlabored ventilation, respiratory function stable and patient connected to nasal cannula oxygen Cardiovascular status: stable and blood pressure returned to baseline Postop Assessment: no apparent nausea or vomiting Anesthetic complications: no    Last Vitals:  Vitals:   06/27/17 1500 06/27/17 1510  BP: 117/69 130/71  Pulse: 63   Resp: 14   Temp:  36.7 C  SpO2: 100% 100%    Last Pain:  Vitals:   06/27/17 1510  TempSrc:   PainSc: 0-No pain                 Kagan Hietpas P Climmie Cronce

## 2017-06-27 NOTE — H&P (Signed)
   History and Physical Update  The patient was interviewed and re-examined.  The patient's previous History and Physical has been reviewed and is unchanged from office visit. Plan for left arm avf.   Angelik Walls C. Donzetta Matters, MD [facility] [place] Office: [phone] Pager: [phone]

## 2017-06-27 NOTE — Anesthesia Preprocedure Evaluation (Addendum)
Anesthesia Evaluation  Patient identified by MRN, date of birth, ID band Patient awake    Reviewed: Allergy & Precautions, NPO status , Patient's Chart, lab work & pertinent test results, reviewed documented beta blocker date and time   Airway Mallampati: II  TM Distance: >3 FB Neck ROM: Full    Dental no notable dental hx.    Pulmonary neg pulmonary ROS,    Pulmonary exam normal breath sounds clear to auscultation       Cardiovascular hypertension, Pt. on medications and Pt. on home beta blockers Normal cardiovascular exam Rhythm:Regular Rate:Normal  SB, rate 55  Negative Stress echo No evidence of ischemia. The patient had a hypertensive response to exercise.  LV EF: 55% -   60%   Neuro/Psych negative neurological ROS  negative psych ROS   GI/Hepatic negative GI ROS, Neg liver ROS,   Endo/Other  negative endocrine ROS  Renal/GU ESRFRenal diseaseIg A Nephropathy     Musculoskeletal negative musculoskeletal ROS (+)   Abdominal (+) + obese,   Peds  Hematology  (+) anemia ,   Anesthesia Other Findings Gout  Reproductive/Obstetrics                             Anesthesia Physical Anesthesia Plan  ASA: III  Anesthesia Plan: MAC   Post-op Pain Management:    Induction: Intravenous  PONV Risk Score and Plan: 1 and Propofol infusion  Airway Management Planned: Simple Face Mask  Additional Equipment:   Intra-op Plan:   Post-operative Plan:   Informed Consent: I have reviewed the patients History and Physical, chart, labs and discussed the procedure including the risks, benefits and alternatives for the proposed anesthesia with the patient or authorized representative who has indicated his/her understanding and acceptance.   Dental advisory given  Plan Discussed with: CRNA  Anesthesia Plan Comments:        Anesthesia Quick Evaluation

## 2017-06-28 ENCOUNTER — Encounter (HOSPITAL_COMMUNITY): Payer: Self-pay | Admitting: Vascular Surgery

## 2017-06-28 ENCOUNTER — Telehealth: Payer: Self-pay | Admitting: Vascular Surgery

## 2017-06-28 NOTE — Telephone Encounter (Signed)
Sched appt 08/09/17; lab at 10:00 and MD ar 10:45. Spoke to pt.

## 2017-06-28 NOTE — Telephone Encounter (Signed)
-----   Message from Mena Goes, RN sent at 06/27/2017  5:01 PM EDT ----- Regarding: 6 weeks w/ duplex   ----- Message ----- From: Waynetta Sandy, MD Sent: 06/27/2017   2:39 PM To: [facility] [address]  [name] [id number] December 26, 1970  06/27/2017 Pre-operative Diagnosis: ckd  Surgeon:  Eda Paschal. Donzetta Matters, MD Assistant: OR nurse  Procedure Performed: Left radiocephalic arteriovenous fistula creation   F/u in 6 weeks with dialysis access duplex

## 2017-07-15 DIAGNOSIS — N028 Recurrent and persistent hematuria with other morphologic changes: Secondary | ICD-10-CM | POA: Diagnosis not present

## 2017-07-15 DIAGNOSIS — I10 Essential (primary) hypertension: Secondary | ICD-10-CM | POA: Diagnosis not present

## 2017-07-15 DIAGNOSIS — M109 Gout, unspecified: Secondary | ICD-10-CM | POA: Diagnosis not present

## 2017-07-15 DIAGNOSIS — N184 Chronic kidney disease, stage 4 (severe): Secondary | ICD-10-CM | POA: Diagnosis not present

## 2017-07-18 NOTE — Addendum Note (Signed)
Addended by: Lianne Cure A on: 07/18/2017 10:35 AM   Modules accepted: Orders

## 2017-07-24 ENCOUNTER — Encounter (HOSPITAL_COMMUNITY): Payer: 59

## 2017-07-24 ENCOUNTER — Other Ambulatory Visit (HOSPITAL_COMMUNITY): Payer: 59

## 2017-07-24 ENCOUNTER — Encounter: Payer: 59 | Admitting: Vascular Surgery

## 2017-07-29 ENCOUNTER — Other Ambulatory Visit (HOSPITAL_COMMUNITY): Payer: Self-pay | Admitting: *Deleted

## 2017-07-30 ENCOUNTER — Ambulatory Visit (HOSPITAL_COMMUNITY)
Admission: RE | Admit: 2017-07-30 | Discharge: 2017-07-30 | Disposition: A | Discharge status: Home / Self Care | Payer: 59 | Source: Ambulatory Visit | Attending: Nephrology | Admitting: Nephrology

## 2017-07-30 DIAGNOSIS — N189 Chronic kidney disease, unspecified: Secondary | ICD-10-CM | POA: Insufficient documentation

## 2017-07-30 DIAGNOSIS — D631 Anemia in chronic kidney disease: Secondary | ICD-10-CM | POA: Diagnosis present

## 2017-07-30 MED ORDER — SODIUM CHLORIDE 0.9 % IV SOLN
510.0000 mg | INTRAVENOUS | Status: DC
  Administered 2017-07-30: 510 mg via INTRAVENOUS
  Filled 2017-07-30: qty 17

## 2017-07-30 NOTE — Discharge Instructions (Signed)

## 2017-08-06 ENCOUNTER — Ambulatory Visit (HOSPITAL_COMMUNITY)
Admission: RE | Admit: 2017-08-06 | Discharge: 2017-08-06 | Disposition: A | Discharge status: Home / Self Care | Payer: 59 | Source: Ambulatory Visit | Attending: Nephrology | Admitting: Nephrology

## 2017-08-06 DIAGNOSIS — D631 Anemia in chronic kidney disease: Secondary | ICD-10-CM | POA: Insufficient documentation

## 2017-08-06 DIAGNOSIS — N189 Chronic kidney disease, unspecified: Secondary | ICD-10-CM | POA: Insufficient documentation

## 2017-08-06 MED ORDER — SODIUM CHLORIDE 0.9 % IV SOLN
510.0000 mg | INTRAVENOUS | Status: DC
  Administered 2017-08-06: 510 mg via INTRAVENOUS
  Filled 2017-08-06: qty 17

## 2017-08-09 ENCOUNTER — Encounter: Payer: Self-pay | Admitting: Vascular Surgery

## 2017-08-09 ENCOUNTER — Ambulatory Visit (HOSPITAL_COMMUNITY)
Admission: RE | Admit: 2017-08-09 | Discharge: 2017-08-09 | Disposition: A | Discharge status: Home / Self Care | Payer: 59 | Source: Ambulatory Visit | Attending: Vascular Surgery | Admitting: Vascular Surgery

## 2017-08-09 ENCOUNTER — Ambulatory Visit (INDEPENDENT_AMBULATORY_CARE_PROVIDER_SITE_OTHER): Payer: Self-pay | Admitting: Vascular Surgery

## 2017-08-09 VITALS — BP 119/69 | HR 66 | Resp 20 | Ht 77.0 in | Wt 260.0 lb

## 2017-08-09 DIAGNOSIS — N184 Chronic kidney disease, stage 4 (severe): Secondary | ICD-10-CM | POA: Diagnosis not present

## 2017-08-09 DIAGNOSIS — Z48812 Encounter for surgical aftercare following surgery on the circulatory system: Secondary | ICD-10-CM

## 2017-08-09 DIAGNOSIS — I770 Arteriovenous fistula, acquired: Secondary | ICD-10-CM | POA: Diagnosis not present

## 2017-08-09 NOTE — Progress Notes (Signed)
Subjective:     Patient ID: Eric Douglas, male   DOB: Dec 23, 1970, 46 y.o.   MRN: [id number]  HPI 46 year old male with chronic kidney disease presents for follow-up of recent placement left radiocephalic AV fistula.  He is not having any trouble with his hands and his wounds have healed well.  He is not on dialysis yet.  Review of Systems No complaints today    Objective:   Physical Exam aaox3 Left radial pulse 2+ Strong thrill in left arm   I have independently interpreted his dialysis duplex which demonstrates flow volumes 1741 mL/min with a diameter greater than 6 mm throughout.    Assessment/plan     46 year old male follows up from recent left radiocephalic AV fistula creation.  Flow volumes are adequate and vein is maturing nicely.  In another 6 weeks that should be usable should he need it.  If he needs dialysis in the interim we will need to place a catheter.  He demonstrates good understanding and can follow-up on a as needed basis per        Wasta C. Donzetta Matters, MD [facility] [place] Office: [phone] Pager: [phone]

## 2017-09-30 DIAGNOSIS — N184 Chronic kidney disease, stage 4 (severe): Secondary | ICD-10-CM | POA: Diagnosis not present

## 2017-09-30 DIAGNOSIS — I129 Hypertensive chronic kidney disease with stage 1 through stage 4 chronic kidney disease, or unspecified chronic kidney disease: Secondary | ICD-10-CM | POA: Diagnosis not present

## 2017-09-30 DIAGNOSIS — Z01818 Encounter for other preprocedural examination: Secondary | ICD-10-CM | POA: Diagnosis not present

## 2017-09-30 DIAGNOSIS — Z7682 Awaiting organ transplant status: Secondary | ICD-10-CM | POA: Diagnosis not present

## 2017-09-30 DIAGNOSIS — N185 Chronic kidney disease, stage 5: Secondary | ICD-10-CM | POA: Diagnosis not present

## 2017-09-30 DIAGNOSIS — I10 Essential (primary) hypertension: Secondary | ICD-10-CM | POA: Diagnosis not present

## 2017-09-30 DIAGNOSIS — N028 Recurrent and persistent hematuria with other morphologic changes: Secondary | ICD-10-CM | POA: Diagnosis not present

## 2017-10-07 ENCOUNTER — Other Ambulatory Visit (HOSPITAL_COMMUNITY): Payer: Self-pay | Admitting: *Deleted

## 2017-10-09 ENCOUNTER — Ambulatory Visit (HOSPITAL_COMMUNITY)
Admission: RE | Admit: 2017-10-09 | Discharge: 2017-10-09 | Disposition: A | Discharge status: Home / Self Care | Payer: 59 | Source: Ambulatory Visit | Attending: Nephrology | Admitting: Nephrology

## 2017-10-09 DIAGNOSIS — N189 Chronic kidney disease, unspecified: Secondary | ICD-10-CM | POA: Insufficient documentation

## 2017-10-09 DIAGNOSIS — D631 Anemia in chronic kidney disease: Secondary | ICD-10-CM | POA: Diagnosis present

## 2017-10-09 MED ORDER — SODIUM CHLORIDE 0.9 % IV SOLN
510.0000 mg | INTRAVENOUS | Status: DC
  Administered 2017-10-09: 510 mg via INTRAVENOUS
  Filled 2017-10-09: qty 17

## 2017-10-11 ENCOUNTER — Other Ambulatory Visit: Payer: Self-pay | Admitting: Internal Medicine

## 2017-10-11 NOTE — Telephone Encounter (Signed)
Please advise last filled 05/2017

## 2017-10-14 NOTE — Telephone Encounter (Signed)
Pt following up on refill request, per his pharamcy

## 2017-10-14 NOTE — Telephone Encounter (Signed)
RX printed and signed and placed in MYD box 

## 2017-10-14 NOTE — Telephone Encounter (Signed)
Rx faxed to pharmacy  

## 2017-10-16 ENCOUNTER — Encounter (HOSPITAL_COMMUNITY): Payer: Self-pay

## 2017-10-16 ENCOUNTER — Ambulatory Visit (HOSPITAL_COMMUNITY)
Admission: RE | Admit: 2017-10-16 | Discharge: 2017-10-16 | Disposition: A | Discharge status: Home / Self Care | Payer: 59 | Source: Ambulatory Visit | Attending: Nephrology | Admitting: Nephrology

## 2017-10-16 DIAGNOSIS — D631 Anemia in chronic kidney disease: Secondary | ICD-10-CM | POA: Insufficient documentation

## 2017-10-16 MED ORDER — SODIUM CHLORIDE 0.9 % IV SOLN
510.0000 mg | INTRAVENOUS | Status: AC
  Administered 2017-10-16: 510 mg via INTRAVENOUS
  Filled 2017-10-16: qty 17

## 2017-10-29 DIAGNOSIS — R69 Illness, unspecified: Secondary | ICD-10-CM | POA: Diagnosis not present

## 2017-10-29 DIAGNOSIS — Z01818 Encounter for other preprocedural examination: Secondary | ICD-10-CM | POA: Diagnosis not present

## 2017-10-29 DIAGNOSIS — Z524 Kidney donor: Secondary | ICD-10-CM | POA: Diagnosis not present

## 2017-10-30 DIAGNOSIS — Z524 Kidney donor: Secondary | ICD-10-CM | POA: Diagnosis not present

## 2017-11-11 DIAGNOSIS — N186 End stage renal disease: Secondary | ICD-10-CM | POA: Diagnosis not present

## 2017-11-12 DIAGNOSIS — N186 End stage renal disease: Secondary | ICD-10-CM | POA: Insufficient documentation

## 2017-11-19 DIAGNOSIS — E8770 Fluid overload, unspecified: Secondary | ICD-10-CM | POA: Diagnosis not present

## 2017-11-19 DIAGNOSIS — N186 End stage renal disease: Secondary | ICD-10-CM | POA: Diagnosis not present

## 2017-11-19 DIAGNOSIS — N185 Chronic kidney disease, stage 5: Secondary | ICD-10-CM | POA: Diagnosis not present

## 2017-11-19 DIAGNOSIS — K660 Peritoneal adhesions (postprocedural) (postinfection): Secondary | ICD-10-CM | POA: Diagnosis not present

## 2017-11-19 DIAGNOSIS — N189 Chronic kidney disease, unspecified: Secondary | ICD-10-CM | POA: Diagnosis not present

## 2017-11-19 DIAGNOSIS — Z4902 Encounter for fitting and adjustment of peritoneal dialysis catheter: Secondary | ICD-10-CM | POA: Diagnosis not present

## 2017-12-13 DIAGNOSIS — Z992 Dependence on renal dialysis: Secondary | ICD-10-CM | POA: Diagnosis not present

## 2017-12-13 DIAGNOSIS — N028 Recurrent and persistent hematuria with other morphologic changes: Secondary | ICD-10-CM | POA: Diagnosis not present

## 2017-12-13 DIAGNOSIS — N186 End stage renal disease: Secondary | ICD-10-CM | POA: Diagnosis not present

## 2017-12-16 DIAGNOSIS — N186 End stage renal disease: Secondary | ICD-10-CM | POA: Diagnosis not present

## 2017-12-16 DIAGNOSIS — E872 Acidosis, unspecified: Secondary | ICD-10-CM | POA: Insufficient documentation

## 2017-12-16 DIAGNOSIS — Z7682 Awaiting organ transplant status: Secondary | ICD-10-CM | POA: Diagnosis not present

## 2017-12-16 DIAGNOSIS — N2581 Secondary hyperparathyroidism of renal origin: Secondary | ICD-10-CM | POA: Diagnosis not present

## 2017-12-16 DIAGNOSIS — D631 Anemia in chronic kidney disease: Secondary | ICD-10-CM | POA: Diagnosis not present

## 2017-12-16 DIAGNOSIS — Z01818 Encounter for other preprocedural examination: Secondary | ICD-10-CM | POA: Diagnosis not present

## 2017-12-16 DIAGNOSIS — Z005 Encounter for examination of potential donor of organ and tissue: Secondary | ICD-10-CM | POA: Diagnosis not present

## 2017-12-19 DIAGNOSIS — Z23 Encounter for immunization: Secondary | ICD-10-CM | POA: Diagnosis not present

## 2017-12-19 DIAGNOSIS — D509 Iron deficiency anemia, unspecified: Secondary | ICD-10-CM | POA: Diagnosis not present

## 2017-12-19 DIAGNOSIS — N186 End stage renal disease: Secondary | ICD-10-CM | POA: Diagnosis not present

## 2017-12-23 DIAGNOSIS — N186 End stage renal disease: Secondary | ICD-10-CM | POA: Diagnosis not present

## 2017-12-23 DIAGNOSIS — Z23 Encounter for immunization: Secondary | ICD-10-CM | POA: Diagnosis not present

## 2017-12-23 DIAGNOSIS — D509 Iron deficiency anemia, unspecified: Secondary | ICD-10-CM | POA: Diagnosis not present

## 2017-12-26 DIAGNOSIS — N186 End stage renal disease: Secondary | ICD-10-CM | POA: Diagnosis not present

## 2017-12-26 DIAGNOSIS — N2581 Secondary hyperparathyroidism of renal origin: Secondary | ICD-10-CM | POA: Diagnosis not present

## 2017-12-29 DIAGNOSIS — Z23 Encounter for immunization: Secondary | ICD-10-CM | POA: Diagnosis not present

## 2017-12-29 DIAGNOSIS — N186 End stage renal disease: Secondary | ICD-10-CM | POA: Diagnosis not present

## 2017-12-29 DIAGNOSIS — D509 Iron deficiency anemia, unspecified: Secondary | ICD-10-CM | POA: Diagnosis not present

## 2018-01-05 DIAGNOSIS — D509 Iron deficiency anemia, unspecified: Secondary | ICD-10-CM | POA: Diagnosis not present

## 2018-01-05 DIAGNOSIS — N2581 Secondary hyperparathyroidism of renal origin: Secondary | ICD-10-CM | POA: Diagnosis not present

## 2018-01-05 DIAGNOSIS — N186 End stage renal disease: Secondary | ICD-10-CM | POA: Diagnosis not present

## 2018-01-12 DIAGNOSIS — N390 Urinary tract infection, site not specified: Secondary | ICD-10-CM | POA: Diagnosis not present

## 2018-01-12 DIAGNOSIS — N2581 Secondary hyperparathyroidism of renal origin: Secondary | ICD-10-CM | POA: Diagnosis not present

## 2018-01-12 DIAGNOSIS — R300 Dysuria: Secondary | ICD-10-CM | POA: Diagnosis not present

## 2018-01-12 DIAGNOSIS — N186 End stage renal disease: Secondary | ICD-10-CM | POA: Diagnosis not present

## 2018-01-12 DIAGNOSIS — R319 Hematuria, unspecified: Secondary | ICD-10-CM | POA: Diagnosis not present

## 2018-01-13 ENCOUNTER — Ambulatory Visit (INDEPENDENT_AMBULATORY_CARE_PROVIDER_SITE_OTHER): Payer: 59 | Admitting: Internal Medicine

## 2018-01-13 ENCOUNTER — Encounter: Payer: Self-pay | Admitting: Internal Medicine

## 2018-01-13 VITALS — BP 128/78 | HR 72 | Temp 97.7°F | Wt 274.0 lb

## 2018-01-13 DIAGNOSIS — N186 End stage renal disease: Secondary | ICD-10-CM | POA: Diagnosis not present

## 2018-01-13 DIAGNOSIS — R3911 Hesitancy of micturition: Secondary | ICD-10-CM

## 2018-01-13 DIAGNOSIS — D631 Anemia in chronic kidney disease: Secondary | ICD-10-CM | POA: Diagnosis not present

## 2018-01-13 DIAGNOSIS — N028 Recurrent and persistent hematuria with other morphologic changes: Secondary | ICD-10-CM | POA: Diagnosis not present

## 2018-01-13 DIAGNOSIS — R300 Dysuria: Secondary | ICD-10-CM | POA: Diagnosis not present

## 2018-01-13 DIAGNOSIS — N400 Enlarged prostate without lower urinary tract symptoms: Secondary | ICD-10-CM | POA: Diagnosis not present

## 2018-01-13 DIAGNOSIS — N2581 Secondary hyperparathyroidism of renal origin: Secondary | ICD-10-CM | POA: Diagnosis not present

## 2018-01-13 DIAGNOSIS — Z992 Dependence on renal dialysis: Secondary | ICD-10-CM | POA: Diagnosis not present

## 2018-01-13 LAB — POC URINALSYSI DIPSTICK (AUTOMATED)
BILIRUBIN UA: NEGATIVE
Glucose, UA: NEGATIVE
KETONES UA: NEGATIVE
Nitrite, UA: NEGATIVE
PH UA: 6 (ref 5.0–8.0)
SPEC GRAV UA: 1.025 (ref 1.010–1.025)
Urobilinogen, UA: 0.2 E.U./dL

## 2018-01-13 MED ORDER — TAMSULOSIN HCL 0.4 MG PO CAPS: 0.4000 mg | ORAL_CAPSULE | Freq: Every day | ORAL | 0 refills | Status: DC

## 2018-01-13 NOTE — Patient Instructions (Signed)
Benign Prostatic Hyperplasia  Benign prostatic hyperplasia (BPH) is an enlarged prostate gland that is caused by the normal aging process and not by cancer. The prostate is a walnut-sized gland that is involved in the production of semen. It is located in front of the rectum and below the bladder. The bladder stores urine and the urethra is the tube that carries the urine out of the body. The prostate may get bigger as a man gets older.  An enlarged prostate can press on the urethra. This can make it harder to pass urine. The build-up of urine in the bladder can cause infection. Back pressure and infection may progress to bladder damage and kidney (renal) failure.  What are the causes?  This condition is part of a normal aging process. However, not all men develop problems from this condition. If the prostate enlarges away from the urethra, urine flow will not be blocked. If it enlarges toward the urethra and compresses it, there will be problems passing urine.  What increases the risk?  This condition is more likely to develop in men over the age of 50 years.  What are the signs or symptoms?  Symptoms of this condition include:  · Getting up often during the night to urinate.  · Needing to urinate frequently during the day.  · Difficulty starting urine flow.  · Decrease in size and strength of your urine stream.  · Leaking (dribbling) after urinating.  · Inability to pass urine. This needs immediate treatment.  · Inability to completely empty your bladder.  · Pain when you pass urine. This is more common if there is also an infection.  · Urinary tract infection (UTI).    How is this diagnosed?  This condition is diagnosed based on your medical history, a physical exam, and your symptoms. Tests will also be done, such as:  · A post-void bladder scan. This measures any amount of urine that may remain in your bladder after you finish urinating.  · A digital rectal exam. In a rectal exam, your health care provider  checks your prostate by putting a lubricated, gloved finger into your rectum to feel the back of your prostate gland. This exam detects the size of your gland and any abnormal lumps or growths.  · An exam of your urine (urinalysis).  · A prostate specific antigen (PSA) screening. This is a blood test used to screen for prostate cancer.  · An ultrasound. This test uses sound waves to electronically produce a picture of your prostate gland.    Your health care provider may refer you to a specialist in kidney and prostate diseases (urologist).  How is this treated?  Once symptoms begin, your health care provider will monitor your condition (active surveillance or watchful waiting). Treatment for this condition will depend on the severity of your condition. Treatment may include:  · Observation and yearly exams. This may be the only treatment needed if your condition and symptoms are mild.  · Medicines to relieve your symptoms, including:  ? Medicines to shrink the prostate.  ? Medicines to relax the muscle of the prostate.  · Surgery in severe cases. Surgery may include:  ? Prostatectomy. In this procedure, the prostate tissue is removed completely through an open incision or with a laparascope or robotics.  ? Transurethral resection of the prostate (TURP). In this procedure, a tool is inserted through the opening at the tip of the penis (urethra). It is used to cut away tissue of   the inner core of the prostate. The pieces are removed through the same opening of the penis. This removes the blockage.  ? Transurethral incision (TUIP). In this procedure, small cuts are made in the prostate. This lessens the prostate's pressure on the urethra.  ? Transurethral microwave thermotherapy (TUMT). This procedure uses microwaves to create heat. The heat destroys and removes a small amount of prostate tissue.  ? Transurethral needle ablation (TUNA). This procedure uses radio frequencies to destroy and remove a small amount of  prostate tissue.  ? Interstitial laser coagulation (ILC). This procedure uses a laser to destroy and remove a small amount of prostate tissue.  ? Transurethral electrovaporization (TUVP). This procedure uses electrodes to destroy and remove a small amount of prostate tissue.  ? Prostatic urethral lift. This procedure inserts an implant to push the lobes of the prostate away from the urethra.    Follow these instructions at home:  · Take over-the-counter and prescription medicines only as told by your health care provider.  · Monitor your symptoms for any changes. Contact your health care provider with any changes.  · Avoid drinking large amounts of liquid before going to bed or out in public.  · Avoid or reduce how much caffeine or alcohol you drink.  · Give yourself time when you urinate.  · Keep all follow-up visits as told by your health care provider. This is important.  Contact a health care provider if:  · You have unexplained back pain.  · Your symptoms do not get better with treatment.  · You develop side effects from the medicine you are taking.  · Your urine becomes very dark or has a bad smell.  · Your lower abdomen becomes distended and you have trouble passing your urine.  Get help right away if:  · You have a fever or chills.  · You suddenly cannot urinate.  · You feel lightheaded, or very dizzy, or you faint.  · There are large amounts of blood or clots in the urine.  · Your urinary problems become hard to manage.  · You develop moderate to severe low back or flank pain. The flank is the side of your body between the ribs and the hip.  These symptoms may represent a serious problem that is an emergency. Do not wait to see if the symptoms will go away. Get medical help right away. Call your local emergency services (911 in the [place] Do not drive yourself to the hospital.  Summary  · Benign prostatic hyperplasia (BPH) is an enlarged prostate that is caused by the normal aging process and not by  cancer.  · An enlarged prostate can press on the urethra. This can make it hard to pass urine.  · This condition is part of a normal aging process and is more likely to develop in men over the age of 50 years.  · Get help right away if you suddenly cannot urinate.  This information is not intended to replace advice given to you by your health care provider. Make sure you discuss any questions you have with your health care provider.  Document Released: 10/01/2005 Document Revised: 11/05/2016 Document Reviewed: 11/05/2016  Elsevier Interactive Patient Education © 2018 [employer]

## 2018-01-13 NOTE — Progress Notes (Signed)
Subjective:    Patient ID: Eric Douglas, male    DOB: October 11, 1971, 47 y.o.   MRN: [id number]  HPI   Pt presents to the clinic today with c/o dysuria, hesitancy and fever. He reports this started 4-5 days ago. He reports he feels like he is having to push his urine out, but he just experiences dribbling. He denies urgency, frequency or blood in his urine. He denies penile discharge or testicular pain. He denies pain behind the testicles. He has no concern about STD. He went to [facility] yesterday for the same. Urinalysis showed moderate blood. He was started on Cipro while urine culture was pending. He does have a history of ESRD, on peritoneal dialysis. He does follow with nephrology and is seeing the dialysis center today to r/o peritonitis. He did have a vasectomy 08/2017, but does not routinely follow with urology. He has no history for kidney stones.  Review of Systems      Past Medical History:  Diagnosis Date  . Fibula fracture    ORIF 1990  . Gout    non crystal proven   . History of chickenpox   . History of kidney disease   . HTN (hypertension)   . Nephropathy    Ig A; dx 2002. micropscopic hematuria and proteinuria  . Tibia fracture    ORIF 1990  . Varicocele    L testes  . Venous incompetence    R leg    Current Outpatient Medications  Medication Sig Dispense Refill  . allopurinol (ZYLOPRIM) 100 MG tablet Take 100 mg by mouth daily as needed (gout).   2  . amLODipine (NORVASC) 10 MG tablet Take 10 mg by mouth daily.     Marland Kitchen BYSTOLIC 10 MG tablet Take 10 mg by mouth daily.     . ciprofloxacin (CIPRO) 500 MG tablet Take 1 tablet by mouth 2 (two) times daily.    . furosemide (LASIX) 40 MG tablet Take 40 mg by mouth every other day.     . Omega-3 Fatty Acids (FISH OIL PO) Take 3 capsules by mouth daily.    Marland Kitchen testosterone cypionate (DEPOTESTOSTERONE CYPIONATE) 200 MG/ML injection INJECT 1 ML INTO MUSCLE EVERY 14 DAYS 10 mL 0  . calcium acetate (PHOSLO) 667 MG tablet Take  1 tablet by mouth 3 (three) times daily with meals.     No current facility-administered medications for this visit.     No Known Allergies  Family History  Problem Relation Age of Onset  . Hypertension Unknown        GP  . Stroke Unknown   . Diabetes Unknown        DM - GP   . Cancer Neg Hx   . Heart disease Neg Hx     Social History   Socioeconomic History  . Marital status: Married    Spouse name: Not on file  . Number of children: Not on file  . Years of education: Not on file  . Highest education level: Not on file  Occupational History  . Not on file  Social Needs  . Financial resource strain: Not on file  . Food insecurity:    Worry: Not on file    Inability: Not on file  . Transportation needs:    Medical: Not on file    Non-medical: Not on file  Tobacco Use  . Smoking status: Never Smoker  . Smokeless tobacco: Never Used  Substance and Sexual Activity  . Alcohol use:  No    Alcohol/week: 0.0 oz  . Drug use: No  . Sexual activity: Yes  Lifestyle  . Physical activity:    Days per week: Not on file    Minutes per session: Not on file  . Stress: Not on file  Relationships  . Social connections:    Talks on phone: Not on file    Gets together: Not on file    Attends religious service: Not on file    Active member of club or organization: Not on file    Attends meetings of clubs or organizations: Not on file    Relationship status: Not on file  . Intimate partner violence:    Fear of current or ex partner: Not on file    Emotionally abused: Not on file    Physically abused: Not on file    Forced sexual activity: Not on file  Other Topics Concern  . Not on file  Social History Narrative   Divorced, primary custody of son; [name]; gets regular exercise.       Pt signed designated party release and gives no access to medical records. Can leave msg on cell [phone].      Constitutional: Pt reports fever. Denies malaise, fatigue,  headache or abrupt weight changes.  Gastrointestinal: Denies abdominal pain, bloating, constipation, diarrhea or blood in the stool.  GU: Pt reports dysuria, hesitancy. Denies urgency, frequency, burning sensation, blood in urine, odor or discharge.  No other specific complaints in a complete review of systems (except as listed in HPI above).  Objective:   Physical Exam   BP 128/78   Pulse 72   Temp 97.7 F (36.5 C) (Oral)   Wt 274 lb (124.3 kg)   SpO2 97%   BMI 32.49 kg/m  Wt Readings from Last 3 Encounters:  01/13/18 274 lb (124.3 kg)  10/16/17 260 lb (117.9 kg)  08/09/17 260 lb (117.9 kg)    General: Appears his stated age, well developed, well nourished in NAD.  Abdomen: Soft and nontender. Normal bowel sounds. No distention or masses noted. Peritoneal dialysis catheter noted. No CVA tenderness noted. Rectal: Normal rectal tone. Prostate enlarged, but non nodular, nontender. Neurological: Alert and oriented.    BMET    Component Value Date/Time   NA 139 06/27/2017 0853   K 5.6 (H) 06/27/2017 0853   CL 106 06/07/2017 0817   CO2 24 06/07/2017 0817   GLUCOSE 91 06/27/2017 0853   BUN 116 (HH) 06/07/2017 0817   CREATININE 6.20 (HH) 06/07/2017 0817   CALCIUM 9.5 06/07/2017 0817   GFRNONAA 39.80 03/03/2009 0853   GFRAA 39 11/04/2008 1133    Lipid Panel     Component Value Date/Time   CHOL 114 06/07/2017 0817   TRIG 99.0 06/07/2017 0817   HDL 29.90 (L) 06/07/2017 0817   CHOLHDL 4 06/07/2017 0817   VLDL 19.8 06/07/2017 0817   LDLCALC 65 06/07/2017 0817    CBC    Component Value Date/Time   WBC 6.6 06/07/2017 0817   RBC 4.02 (L) 06/07/2017 0817   HGB 11.9 (L) 06/27/2017 0853   HCT 35.0 (L) 06/27/2017 0853   PLT 226.0 06/07/2017 0817   MCV 91.1 06/07/2017 0817   MCHC 33.9 06/07/2017 0817   RDW 13.0 06/07/2017 0817    Hgb A1C Lab Results  Component Value Date   HGBA1C 5.1 06/07/2017           Assessment & Plan:   Dysuria, Urinary  Hesitancy:  ?  Kidney stone, UTI, BPH, prostatitis Urinalysis: Trace lueks, moderate blood He had some sediment in his urine, I looked at this under the microscope, appears to be epithelial cells, 1 yeast bud noted Will not send urine culture, he will follow up with UC regarding the urine culture Continue Cipro for now He declines STD screening eRx for Flomax 0.4 mg PO daily Will get CT renal stone study if no improvement, strainer provided Will hold off on Diflucan, can not find clear dosing for ESRD on PD  Return precautions discussed Webb Silversmith, NP

## 2018-01-19 DIAGNOSIS — N186 End stage renal disease: Secondary | ICD-10-CM | POA: Diagnosis not present

## 2018-01-19 DIAGNOSIS — N2581 Secondary hyperparathyroidism of renal origin: Secondary | ICD-10-CM | POA: Diagnosis not present

## 2018-01-20 ENCOUNTER — Telehealth: Payer: Self-pay | Admitting: Internal Medicine

## 2018-01-20 ENCOUNTER — Other Ambulatory Visit: Payer: Self-pay | Admitting: Internal Medicine

## 2018-01-20 DIAGNOSIS — R311 Benign essential microscopic hematuria: Secondary | ICD-10-CM

## 2018-01-20 DIAGNOSIS — R300 Dysuria: Secondary | ICD-10-CM

## 2018-01-20 NOTE — Telephone Encounter (Signed)
Copied from [place] [id number]. Topic: Quick Communication - See Telephone Encounter >> Jan 20, 2018  8:49 AM Ether Griffins B wrote: CRM for notification. See Telephone encounter for: 01/20/18. Pt calling in stating he has not gotten any better since 01/13/18 OV and would like to move forward with CT scan. Also wanted to make provider aware that [facility] and [facility] did a urine culture and it came back for a bladder infection but the antibiotic they gave him is not helping.

## 2018-01-20 NOTE — Telephone Encounter (Signed)
CT renal stone study ordered. Can we get a copy of the culture report from [facility]

## 2018-01-21 ENCOUNTER — Ambulatory Visit (INDEPENDENT_AMBULATORY_CARE_PROVIDER_SITE_OTHER)
Admission: RE | Admit: 2018-01-21 | Discharge: 2018-01-21 | Disposition: A | Discharge status: Home / Self Care | Payer: 59 | Source: Ambulatory Visit | Attending: Internal Medicine | Admitting: Internal Medicine

## 2018-01-21 DIAGNOSIS — R300 Dysuria: Secondary | ICD-10-CM

## 2018-01-21 DIAGNOSIS — R3129 Other microscopic hematuria: Secondary | ICD-10-CM | POA: Diagnosis not present

## 2018-01-21 DIAGNOSIS — R311 Benign essential microscopic hematuria: Secondary | ICD-10-CM

## 2018-01-23 ENCOUNTER — Telehealth: Payer: Self-pay

## 2018-01-23 DIAGNOSIS — R3911 Hesitancy of micturition: Secondary | ICD-10-CM

## 2018-01-23 DIAGNOSIS — R300 Dysuria: Secondary | ICD-10-CM

## 2018-01-23 DIAGNOSIS — N400 Enlarged prostate without lower urinary tract symptoms: Secondary | ICD-10-CM

## 2018-01-23 NOTE — Telephone Encounter (Signed)
Pt was seen on 01/13/18.Please advise.

## 2018-01-23 NOTE — Telephone Encounter (Signed)
Copied from [place]. Topic: Referral - Request >> Jan 23, 2018  8:38 AM Yvette Rack wrote: Reason for CRM: patient would like to see a urologist

## 2018-01-23 NOTE — Addendum Note (Signed)
Addended by: Jearld Fenton on: 01/23/2018 10:36 AM   Modules accepted: Orders

## 2018-01-23 NOTE — Telephone Encounter (Signed)
Referral placed.

## 2018-01-24 ENCOUNTER — Encounter (INDEPENDENT_AMBULATORY_CARE_PROVIDER_SITE_OTHER): Payer: Self-pay

## 2018-01-26 DIAGNOSIS — N2581 Secondary hyperparathyroidism of renal origin: Secondary | ICD-10-CM | POA: Diagnosis not present

## 2018-01-26 DIAGNOSIS — N186 End stage renal disease: Secondary | ICD-10-CM | POA: Diagnosis not present

## 2018-02-02 DIAGNOSIS — N2581 Secondary hyperparathyroidism of renal origin: Secondary | ICD-10-CM | POA: Diagnosis not present

## 2018-02-02 DIAGNOSIS — N186 End stage renal disease: Secondary | ICD-10-CM | POA: Diagnosis not present

## 2018-02-04 ENCOUNTER — Other Ambulatory Visit: Payer: Self-pay | Admitting: Internal Medicine

## 2018-02-08 ENCOUNTER — Other Ambulatory Visit: Payer: Self-pay | Admitting: Internal Medicine

## 2018-02-09 DIAGNOSIS — N186 End stage renal disease: Secondary | ICD-10-CM | POA: Diagnosis not present

## 2018-02-09 DIAGNOSIS — N2581 Secondary hyperparathyroidism of renal origin: Secondary | ICD-10-CM | POA: Diagnosis not present

## 2018-02-10 NOTE — Telephone Encounter (Signed)
Last filled 01/13/18... Please advise if okay to refill

## 2018-02-11 DIAGNOSIS — N411 Chronic prostatitis: Secondary | ICD-10-CM | POA: Diagnosis not present

## 2018-02-12 DIAGNOSIS — N2581 Secondary hyperparathyroidism of renal origin: Secondary | ICD-10-CM | POA: Diagnosis not present

## 2018-02-12 DIAGNOSIS — N186 End stage renal disease: Secondary | ICD-10-CM | POA: Diagnosis not present

## 2018-02-16 DIAGNOSIS — D509 Iron deficiency anemia, unspecified: Secondary | ICD-10-CM | POA: Diagnosis not present

## 2018-02-16 DIAGNOSIS — Z79899 Other long term (current) drug therapy: Secondary | ICD-10-CM | POA: Diagnosis not present

## 2018-02-16 DIAGNOSIS — N186 End stage renal disease: Secondary | ICD-10-CM | POA: Diagnosis not present

## 2018-02-23 DIAGNOSIS — N2581 Secondary hyperparathyroidism of renal origin: Secondary | ICD-10-CM | POA: Diagnosis not present

## 2018-02-23 DIAGNOSIS — N186 End stage renal disease: Secondary | ICD-10-CM | POA: Diagnosis not present

## 2018-03-02 DIAGNOSIS — N186 End stage renal disease: Secondary | ICD-10-CM | POA: Diagnosis not present

## 2018-03-02 DIAGNOSIS — N2581 Secondary hyperparathyroidism of renal origin: Secondary | ICD-10-CM | POA: Diagnosis not present

## 2018-03-09 DIAGNOSIS — Z23 Encounter for immunization: Secondary | ICD-10-CM | POA: Diagnosis not present

## 2018-03-09 DIAGNOSIS — N186 End stage renal disease: Secondary | ICD-10-CM | POA: Diagnosis not present

## 2018-03-09 DIAGNOSIS — N2581 Secondary hyperparathyroidism of renal origin: Secondary | ICD-10-CM | POA: Diagnosis not present

## 2018-03-11 ENCOUNTER — Other Ambulatory Visit: Payer: Self-pay | Admitting: Internal Medicine

## 2018-03-12 NOTE — Telephone Encounter (Signed)
Last filled 10/14/2017... Please advise

## 2018-03-13 DIAGNOSIS — Z7682 Awaiting organ transplant status: Secondary | ICD-10-CM | POA: Diagnosis not present

## 2018-03-13 DIAGNOSIS — N186 End stage renal disease: Secondary | ICD-10-CM | POA: Diagnosis not present

## 2018-03-13 DIAGNOSIS — Z01818 Encounter for other preprocedural examination: Secondary | ICD-10-CM | POA: Diagnosis not present

## 2018-03-14 DIAGNOSIS — Z992 Dependence on renal dialysis: Secondary | ICD-10-CM | POA: Diagnosis not present

## 2018-03-14 DIAGNOSIS — N186 End stage renal disease: Secondary | ICD-10-CM | POA: Diagnosis not present

## 2018-03-14 DIAGNOSIS — N028 Recurrent and persistent hematuria with other morphologic changes: Secondary | ICD-10-CM | POA: Diagnosis not present

## 2018-03-15 DIAGNOSIS — N2581 Secondary hyperparathyroidism of renal origin: Secondary | ICD-10-CM | POA: Diagnosis not present

## 2018-03-15 DIAGNOSIS — N186 End stage renal disease: Secondary | ICD-10-CM | POA: Diagnosis not present

## 2018-03-16 DIAGNOSIS — N2581 Secondary hyperparathyroidism of renal origin: Secondary | ICD-10-CM | POA: Diagnosis not present

## 2018-03-16 DIAGNOSIS — N186 End stage renal disease: Secondary | ICD-10-CM | POA: Diagnosis not present

## 2018-03-16 DIAGNOSIS — D509 Iron deficiency anemia, unspecified: Secondary | ICD-10-CM | POA: Diagnosis not present

## 2018-03-18 ENCOUNTER — Other Ambulatory Visit: Payer: Self-pay | Admitting: Nephrology

## 2018-03-18 DIAGNOSIS — Z7682 Awaiting organ transplant status: Secondary | ICD-10-CM

## 2018-03-23 DIAGNOSIS — N186 End stage renal disease: Secondary | ICD-10-CM | POA: Diagnosis not present

## 2018-03-23 DIAGNOSIS — N2581 Secondary hyperparathyroidism of renal origin: Secondary | ICD-10-CM | POA: Diagnosis not present

## 2018-03-25 ENCOUNTER — Ambulatory Visit (INDEPENDENT_AMBULATORY_CARE_PROVIDER_SITE_OTHER): Payer: 59

## 2018-03-25 DIAGNOSIS — Z7682 Awaiting organ transplant status: Secondary | ICD-10-CM

## 2018-03-25 LAB — EXERCISE TOLERANCE TEST
CSEPEDS: 47 s
CSEPEW: 13 METS
CSEPHR: 86 %
CSEPPHR: 150 {beats}/min
Exercise duration (min): 10 min
MPHR: 173 {beats}/min
RPE: 17
Rest HR: 78 {beats}/min

## 2018-03-27 DIAGNOSIS — D2262 Melanocytic nevi of left upper limb, including shoulder: Secondary | ICD-10-CM | POA: Diagnosis not present

## 2018-03-27 DIAGNOSIS — D2261 Melanocytic nevi of right upper limb, including shoulder: Secondary | ICD-10-CM | POA: Diagnosis not present

## 2018-03-27 DIAGNOSIS — D2272 Melanocytic nevi of left lower limb, including hip: Secondary | ICD-10-CM | POA: Diagnosis not present

## 2018-03-27 DIAGNOSIS — L570 Actinic keratosis: Secondary | ICD-10-CM | POA: Diagnosis not present

## 2018-03-27 DIAGNOSIS — D485 Neoplasm of uncertain behavior of skin: Secondary | ICD-10-CM | POA: Diagnosis not present

## 2018-03-27 DIAGNOSIS — D2361 Other benign neoplasm of skin of right upper limb, including shoulder: Secondary | ICD-10-CM | POA: Diagnosis not present

## 2018-03-28 DIAGNOSIS — R3911 Hesitancy of micturition: Secondary | ICD-10-CM | POA: Diagnosis not present

## 2018-03-28 DIAGNOSIS — N411 Chronic prostatitis: Secondary | ICD-10-CM | POA: Diagnosis not present

## 2018-03-28 DIAGNOSIS — R972 Elevated prostate specific antigen [PSA]: Secondary | ICD-10-CM | POA: Diagnosis not present

## 2018-03-30 DIAGNOSIS — N186 End stage renal disease: Secondary | ICD-10-CM | POA: Diagnosis not present

## 2018-03-30 DIAGNOSIS — N2581 Secondary hyperparathyroidism of renal origin: Secondary | ICD-10-CM | POA: Diagnosis not present

## 2018-04-06 DIAGNOSIS — Z23 Encounter for immunization: Secondary | ICD-10-CM | POA: Diagnosis not present

## 2018-04-06 DIAGNOSIS — N186 End stage renal disease: Secondary | ICD-10-CM | POA: Diagnosis not present

## 2018-04-06 DIAGNOSIS — D631 Anemia in chronic kidney disease: Secondary | ICD-10-CM | POA: Diagnosis not present

## 2018-04-08 DIAGNOSIS — D485 Neoplasm of uncertain behavior of skin: Secondary | ICD-10-CM | POA: Diagnosis not present

## 2018-04-08 DIAGNOSIS — L988 Other specified disorders of the skin and subcutaneous tissue: Secondary | ICD-10-CM | POA: Diagnosis not present

## 2018-04-08 DIAGNOSIS — B078 Other viral warts: Secondary | ICD-10-CM | POA: Diagnosis not present

## 2018-04-09 DIAGNOSIS — Z7682 Awaiting organ transplant status: Secondary | ICD-10-CM | POA: Diagnosis not present

## 2018-04-11 DIAGNOSIS — R972 Elevated prostate specific antigen [PSA]: Secondary | ICD-10-CM | POA: Diagnosis not present

## 2018-04-13 DIAGNOSIS — N028 Recurrent and persistent hematuria with other morphologic changes: Secondary | ICD-10-CM | POA: Diagnosis not present

## 2018-04-13 DIAGNOSIS — N186 End stage renal disease: Secondary | ICD-10-CM | POA: Diagnosis not present

## 2018-04-13 DIAGNOSIS — Z992 Dependence on renal dialysis: Secondary | ICD-10-CM | POA: Diagnosis not present

## 2018-04-13 DIAGNOSIS — N2581 Secondary hyperparathyroidism of renal origin: Secondary | ICD-10-CM | POA: Diagnosis not present

## 2018-04-14 DIAGNOSIS — N2581 Secondary hyperparathyroidism of renal origin: Secondary | ICD-10-CM | POA: Diagnosis not present

## 2018-04-14 DIAGNOSIS — N186 End stage renal disease: Secondary | ICD-10-CM | POA: Diagnosis not present

## 2018-04-14 DIAGNOSIS — Z79899 Other long term (current) drug therapy: Secondary | ICD-10-CM | POA: Diagnosis not present

## 2018-04-18 DIAGNOSIS — R972 Elevated prostate specific antigen [PSA]: Secondary | ICD-10-CM | POA: Diagnosis not present

## 2018-04-20 DIAGNOSIS — N186 End stage renal disease: Secondary | ICD-10-CM | POA: Diagnosis not present

## 2018-04-20 DIAGNOSIS — N2581 Secondary hyperparathyroidism of renal origin: Secondary | ICD-10-CM | POA: Diagnosis not present

## 2018-04-27 DIAGNOSIS — N186 End stage renal disease: Secondary | ICD-10-CM | POA: Diagnosis not present

## 2018-04-27 DIAGNOSIS — N2581 Secondary hyperparathyroidism of renal origin: Secondary | ICD-10-CM | POA: Diagnosis not present

## 2018-05-04 DIAGNOSIS — D631 Anemia in chronic kidney disease: Secondary | ICD-10-CM | POA: Diagnosis not present

## 2018-05-04 DIAGNOSIS — N2581 Secondary hyperparathyroidism of renal origin: Secondary | ICD-10-CM | POA: Diagnosis not present

## 2018-05-04 DIAGNOSIS — N186 End stage renal disease: Secondary | ICD-10-CM | POA: Diagnosis not present

## 2018-05-11 DIAGNOSIS — N186 End stage renal disease: Secondary | ICD-10-CM | POA: Diagnosis not present

## 2018-05-11 DIAGNOSIS — N2581 Secondary hyperparathyroidism of renal origin: Secondary | ICD-10-CM | POA: Diagnosis not present

## 2018-05-14 DIAGNOSIS — N028 Recurrent and persistent hematuria with other morphologic changes: Secondary | ICD-10-CM | POA: Diagnosis not present

## 2018-05-14 DIAGNOSIS — N186 End stage renal disease: Secondary | ICD-10-CM | POA: Diagnosis not present

## 2018-05-14 DIAGNOSIS — Z992 Dependence on renal dialysis: Secondary | ICD-10-CM | POA: Diagnosis not present

## 2018-05-15 DIAGNOSIS — N186 End stage renal disease: Secondary | ICD-10-CM | POA: Diagnosis not present

## 2018-05-15 DIAGNOSIS — N2581 Secondary hyperparathyroidism of renal origin: Secondary | ICD-10-CM | POA: Diagnosis not present

## 2018-05-18 DIAGNOSIS — N2581 Secondary hyperparathyroidism of renal origin: Secondary | ICD-10-CM | POA: Diagnosis not present

## 2018-05-18 DIAGNOSIS — N186 End stage renal disease: Secondary | ICD-10-CM | POA: Diagnosis not present

## 2018-05-25 DIAGNOSIS — N186 End stage renal disease: Secondary | ICD-10-CM | POA: Diagnosis not present

## 2018-05-25 DIAGNOSIS — N2581 Secondary hyperparathyroidism of renal origin: Secondary | ICD-10-CM | POA: Diagnosis not present

## 2018-06-01 DIAGNOSIS — N2589 Other disorders resulting from impaired renal tubular function: Secondary | ICD-10-CM | POA: Diagnosis not present

## 2018-06-01 DIAGNOSIS — N186 End stage renal disease: Secondary | ICD-10-CM | POA: Diagnosis not present

## 2018-06-01 DIAGNOSIS — D631 Anemia in chronic kidney disease: Secondary | ICD-10-CM | POA: Diagnosis not present

## 2018-06-08 DIAGNOSIS — N186 End stage renal disease: Secondary | ICD-10-CM | POA: Diagnosis not present

## 2018-06-08 DIAGNOSIS — N2581 Secondary hyperparathyroidism of renal origin: Secondary | ICD-10-CM | POA: Diagnosis not present

## 2018-06-14 DIAGNOSIS — N186 End stage renal disease: Secondary | ICD-10-CM | POA: Diagnosis not present

## 2018-06-14 DIAGNOSIS — Z992 Dependence on renal dialysis: Secondary | ICD-10-CM | POA: Diagnosis not present

## 2018-06-14 DIAGNOSIS — N028 Recurrent and persistent hematuria with other morphologic changes: Secondary | ICD-10-CM | POA: Diagnosis not present

## 2018-06-15 DIAGNOSIS — N186 End stage renal disease: Secondary | ICD-10-CM | POA: Diagnosis not present

## 2018-06-15 DIAGNOSIS — Z23 Encounter for immunization: Secondary | ICD-10-CM | POA: Diagnosis not present

## 2018-06-15 DIAGNOSIS — N2589 Other disorders resulting from impaired renal tubular function: Secondary | ICD-10-CM | POA: Diagnosis not present

## 2018-06-19 DIAGNOSIS — Z7682 Awaiting organ transplant status: Secondary | ICD-10-CM | POA: Diagnosis not present

## 2018-06-22 DIAGNOSIS — N186 End stage renal disease: Secondary | ICD-10-CM | POA: Diagnosis not present

## 2018-06-22 DIAGNOSIS — N2581 Secondary hyperparathyroidism of renal origin: Secondary | ICD-10-CM | POA: Diagnosis not present

## 2018-06-29 DIAGNOSIS — D509 Iron deficiency anemia, unspecified: Secondary | ICD-10-CM | POA: Diagnosis not present

## 2018-06-29 DIAGNOSIS — N186 End stage renal disease: Secondary | ICD-10-CM | POA: Diagnosis not present

## 2018-06-29 DIAGNOSIS — D631 Anemia in chronic kidney disease: Secondary | ICD-10-CM | POA: Diagnosis not present

## 2018-07-06 DIAGNOSIS — N186 End stage renal disease: Secondary | ICD-10-CM | POA: Diagnosis not present

## 2018-07-06 DIAGNOSIS — N2581 Secondary hyperparathyroidism of renal origin: Secondary | ICD-10-CM | POA: Diagnosis not present

## 2018-07-13 DIAGNOSIS — N186 End stage renal disease: Secondary | ICD-10-CM | POA: Diagnosis not present

## 2018-07-13 DIAGNOSIS — N2581 Secondary hyperparathyroidism of renal origin: Secondary | ICD-10-CM | POA: Diagnosis not present

## 2018-07-14 DIAGNOSIS — N186 End stage renal disease: Secondary | ICD-10-CM | POA: Diagnosis not present

## 2018-07-14 DIAGNOSIS — N028 Recurrent and persistent hematuria with other morphologic changes: Secondary | ICD-10-CM | POA: Diagnosis not present

## 2018-07-14 DIAGNOSIS — Z992 Dependence on renal dialysis: Secondary | ICD-10-CM | POA: Diagnosis not present

## 2018-07-15 DIAGNOSIS — N2581 Secondary hyperparathyroidism of renal origin: Secondary | ICD-10-CM | POA: Diagnosis not present

## 2018-07-15 DIAGNOSIS — N186 End stage renal disease: Secondary | ICD-10-CM | POA: Diagnosis not present

## 2018-07-15 DIAGNOSIS — D631 Anemia in chronic kidney disease: Secondary | ICD-10-CM | POA: Diagnosis not present

## 2018-07-20 DIAGNOSIS — N186 End stage renal disease: Secondary | ICD-10-CM | POA: Diagnosis not present

## 2018-07-20 DIAGNOSIS — N2581 Secondary hyperparathyroidism of renal origin: Secondary | ICD-10-CM | POA: Diagnosis not present

## 2018-07-25 ENCOUNTER — Other Ambulatory Visit: Payer: Self-pay | Admitting: Internal Medicine

## 2018-07-25 DIAGNOSIS — R972 Elevated prostate specific antigen [PSA]: Secondary | ICD-10-CM | POA: Diagnosis not present

## 2018-07-25 NOTE — Telephone Encounter (Signed)
Last filled 03/12/18... Please advise

## 2018-07-27 DIAGNOSIS — N186 End stage renal disease: Secondary | ICD-10-CM | POA: Diagnosis not present

## 2018-07-27 DIAGNOSIS — N2581 Secondary hyperparathyroidism of renal origin: Secondary | ICD-10-CM | POA: Diagnosis not present

## 2018-08-03 DIAGNOSIS — N2581 Secondary hyperparathyroidism of renal origin: Secondary | ICD-10-CM | POA: Diagnosis not present

## 2018-08-03 DIAGNOSIS — N186 End stage renal disease: Secondary | ICD-10-CM | POA: Diagnosis not present

## 2018-08-10 DIAGNOSIS — N2581 Secondary hyperparathyroidism of renal origin: Secondary | ICD-10-CM | POA: Diagnosis not present

## 2018-08-10 DIAGNOSIS — N186 End stage renal disease: Secondary | ICD-10-CM | POA: Diagnosis not present

## 2018-08-10 DIAGNOSIS — D509 Iron deficiency anemia, unspecified: Secondary | ICD-10-CM | POA: Diagnosis not present

## 2018-08-11 DIAGNOSIS — R972 Elevated prostate specific antigen [PSA]: Secondary | ICD-10-CM | POA: Diagnosis not present

## 2018-08-11 DIAGNOSIS — N411 Chronic prostatitis: Secondary | ICD-10-CM | POA: Diagnosis not present

## 2018-08-12 DIAGNOSIS — Z7682 Awaiting organ transplant status: Secondary | ICD-10-CM | POA: Diagnosis not present

## 2018-08-14 DIAGNOSIS — N028 Recurrent and persistent hematuria with other morphologic changes: Secondary | ICD-10-CM | POA: Diagnosis not present

## 2018-08-14 DIAGNOSIS — Z992 Dependence on renal dialysis: Secondary | ICD-10-CM | POA: Diagnosis not present

## 2018-08-14 DIAGNOSIS — N186 End stage renal disease: Secondary | ICD-10-CM | POA: Diagnosis not present

## 2018-08-15 DIAGNOSIS — N186 End stage renal disease: Secondary | ICD-10-CM | POA: Diagnosis not present

## 2018-08-15 DIAGNOSIS — N2581 Secondary hyperparathyroidism of renal origin: Secondary | ICD-10-CM | POA: Diagnosis not present

## 2018-08-17 DIAGNOSIS — N186 End stage renal disease: Secondary | ICD-10-CM | POA: Diagnosis not present

## 2018-08-17 DIAGNOSIS — Z79899 Other long term (current) drug therapy: Secondary | ICD-10-CM | POA: Diagnosis not present

## 2018-08-17 DIAGNOSIS — D631 Anemia in chronic kidney disease: Secondary | ICD-10-CM | POA: Diagnosis not present

## 2018-08-24 DIAGNOSIS — N2581 Secondary hyperparathyroidism of renal origin: Secondary | ICD-10-CM | POA: Diagnosis not present

## 2018-08-24 DIAGNOSIS — N186 End stage renal disease: Secondary | ICD-10-CM | POA: Diagnosis not present

## 2018-08-31 DIAGNOSIS — N186 End stage renal disease: Secondary | ICD-10-CM | POA: Diagnosis not present

## 2018-08-31 DIAGNOSIS — D631 Anemia in chronic kidney disease: Secondary | ICD-10-CM | POA: Diagnosis not present

## 2018-08-31 DIAGNOSIS — D509 Iron deficiency anemia, unspecified: Secondary | ICD-10-CM | POA: Diagnosis not present

## 2018-09-03 DIAGNOSIS — J0141 Acute recurrent pansinusitis: Secondary | ICD-10-CM | POA: Diagnosis not present

## 2018-09-07 DIAGNOSIS — N2581 Secondary hyperparathyroidism of renal origin: Secondary | ICD-10-CM | POA: Diagnosis not present

## 2018-09-07 DIAGNOSIS — N186 End stage renal disease: Secondary | ICD-10-CM | POA: Diagnosis not present

## 2018-09-13 DIAGNOSIS — N186 End stage renal disease: Secondary | ICD-10-CM | POA: Diagnosis not present

## 2018-09-13 DIAGNOSIS — N028 Recurrent and persistent hematuria with other morphologic changes: Secondary | ICD-10-CM | POA: Diagnosis not present

## 2018-09-13 DIAGNOSIS — Z992 Dependence on renal dialysis: Secondary | ICD-10-CM | POA: Diagnosis not present

## 2018-09-14 DIAGNOSIS — N186 End stage renal disease: Secondary | ICD-10-CM | POA: Diagnosis not present

## 2018-09-14 DIAGNOSIS — D631 Anemia in chronic kidney disease: Secondary | ICD-10-CM | POA: Diagnosis not present

## 2018-09-14 DIAGNOSIS — D509 Iron deficiency anemia, unspecified: Secondary | ICD-10-CM | POA: Diagnosis not present

## 2018-09-21 DIAGNOSIS — N186 End stage renal disease: Secondary | ICD-10-CM | POA: Diagnosis not present

## 2018-09-21 DIAGNOSIS — N2581 Secondary hyperparathyroidism of renal origin: Secondary | ICD-10-CM | POA: Diagnosis not present

## 2018-09-28 DIAGNOSIS — N2581 Secondary hyperparathyroidism of renal origin: Secondary | ICD-10-CM | POA: Diagnosis not present

## 2018-09-28 DIAGNOSIS — N186 End stage renal disease: Secondary | ICD-10-CM | POA: Diagnosis not present

## 2018-10-05 DIAGNOSIS — D509 Iron deficiency anemia, unspecified: Secondary | ICD-10-CM | POA: Diagnosis not present

## 2018-10-05 DIAGNOSIS — D631 Anemia in chronic kidney disease: Secondary | ICD-10-CM | POA: Diagnosis not present

## 2018-10-05 DIAGNOSIS — N186 End stage renal disease: Secondary | ICD-10-CM | POA: Diagnosis not present

## 2018-10-12 DIAGNOSIS — N2581 Secondary hyperparathyroidism of renal origin: Secondary | ICD-10-CM | POA: Diagnosis not present

## 2018-10-12 DIAGNOSIS — N186 End stage renal disease: Secondary | ICD-10-CM | POA: Diagnosis not present

## 2018-10-14 DIAGNOSIS — N186 End stage renal disease: Secondary | ICD-10-CM | POA: Diagnosis not present

## 2018-10-14 DIAGNOSIS — Z992 Dependence on renal dialysis: Secondary | ICD-10-CM | POA: Diagnosis not present

## 2018-10-14 DIAGNOSIS — N028 Recurrent and persistent hematuria with other morphologic changes: Secondary | ICD-10-CM | POA: Diagnosis not present

## 2018-10-15 DIAGNOSIS — N186 End stage renal disease: Secondary | ICD-10-CM | POA: Diagnosis not present

## 2018-10-15 DIAGNOSIS — N2581 Secondary hyperparathyroidism of renal origin: Secondary | ICD-10-CM | POA: Diagnosis not present

## 2018-10-16 DIAGNOSIS — N2581 Secondary hyperparathyroidism of renal origin: Secondary | ICD-10-CM | POA: Diagnosis not present

## 2018-10-16 DIAGNOSIS — N186 End stage renal disease: Secondary | ICD-10-CM | POA: Diagnosis not present

## 2018-10-17 DIAGNOSIS — N2581 Secondary hyperparathyroidism of renal origin: Secondary | ICD-10-CM | POA: Diagnosis not present

## 2018-10-17 DIAGNOSIS — N186 End stage renal disease: Secondary | ICD-10-CM | POA: Diagnosis not present

## 2018-10-18 DIAGNOSIS — N186 End stage renal disease: Secondary | ICD-10-CM | POA: Diagnosis not present

## 2018-10-18 DIAGNOSIS — N2581 Secondary hyperparathyroidism of renal origin: Secondary | ICD-10-CM | POA: Diagnosis not present

## 2018-10-19 DIAGNOSIS — D509 Iron deficiency anemia, unspecified: Secondary | ICD-10-CM | POA: Diagnosis not present

## 2018-10-19 DIAGNOSIS — N186 End stage renal disease: Secondary | ICD-10-CM | POA: Diagnosis not present

## 2018-10-19 DIAGNOSIS — Z79899 Other long term (current) drug therapy: Secondary | ICD-10-CM | POA: Diagnosis not present

## 2018-10-20 DIAGNOSIS — Z7682 Awaiting organ transplant status: Secondary | ICD-10-CM | POA: Diagnosis not present

## 2018-10-26 DIAGNOSIS — N2581 Secondary hyperparathyroidism of renal origin: Secondary | ICD-10-CM | POA: Diagnosis not present

## 2018-10-26 DIAGNOSIS — N186 End stage renal disease: Secondary | ICD-10-CM | POA: Diagnosis not present

## 2018-10-28 DIAGNOSIS — Z7682 Awaiting organ transplant status: Secondary | ICD-10-CM | POA: Diagnosis not present

## 2018-11-02 DIAGNOSIS — N186 End stage renal disease: Secondary | ICD-10-CM | POA: Diagnosis not present

## 2018-11-02 DIAGNOSIS — D509 Iron deficiency anemia, unspecified: Secondary | ICD-10-CM | POA: Diagnosis not present

## 2018-11-02 DIAGNOSIS — D631 Anemia in chronic kidney disease: Secondary | ICD-10-CM | POA: Diagnosis not present

## 2018-11-09 DIAGNOSIS — N186 End stage renal disease: Secondary | ICD-10-CM | POA: Diagnosis not present

## 2018-11-09 DIAGNOSIS — N2581 Secondary hyperparathyroidism of renal origin: Secondary | ICD-10-CM | POA: Diagnosis not present

## 2018-11-14 DIAGNOSIS — N186 End stage renal disease: Secondary | ICD-10-CM | POA: Diagnosis not present

## 2018-11-14 DIAGNOSIS — Z992 Dependence on renal dialysis: Secondary | ICD-10-CM | POA: Diagnosis not present

## 2018-11-14 DIAGNOSIS — N028 Recurrent and persistent hematuria with other morphologic changes: Secondary | ICD-10-CM | POA: Diagnosis not present

## 2018-12-03 ENCOUNTER — Encounter: Payer: Self-pay | Admitting: Family Medicine

## 2018-12-03 ENCOUNTER — Ambulatory Visit (INDEPENDENT_AMBULATORY_CARE_PROVIDER_SITE_OTHER): Payer: 59 | Admitting: Family Medicine

## 2018-12-03 ENCOUNTER — Encounter: Payer: Self-pay | Admitting: *Deleted

## 2018-12-03 VITALS — BP 120/60 | HR 67 | Temp 98.1°F | Ht 77.0 in | Wt 308.2 lb

## 2018-12-03 DIAGNOSIS — Z23 Encounter for immunization: Secondary | ICD-10-CM | POA: Diagnosis not present

## 2018-12-03 DIAGNOSIS — L03114 Cellulitis of left upper limb: Secondary | ICD-10-CM

## 2018-12-03 DIAGNOSIS — N028 Recurrent and persistent hematuria with other morphologic changes: Secondary | ICD-10-CM

## 2018-12-03 DIAGNOSIS — W5501XA Bitten by cat, initial encounter: Secondary | ICD-10-CM

## 2018-12-03 DIAGNOSIS — S51852A Open bite of left forearm, initial encounter: Secondary | ICD-10-CM

## 2018-12-03 DIAGNOSIS — N186 End stage renal disease: Secondary | ICD-10-CM

## 2018-12-03 MED ORDER — AMOXICILLIN-POT CLAVULANATE 250-125 MG PO TABS: 1.0000 | ORAL_TABLET | Freq: Two times a day (BID) | ORAL | 0 refills | Status: DC

## 2018-12-03 NOTE — Progress Notes (Signed)
Dr. Frederico Hamman T. Monica Codd, MD, [facility] [place], [address] Phone: [phone] Fax: [phone]  12/03/2018  Patient: Eric Douglas, MRN: [id number], DOB: 1971-04-27, 48 y.o.  Primary Physician:  Jearld Fenton, NP   Chief Complaint  Patient presents with  . Animal Bite    Left Forearm   Subjective:   Eric Douglas is a 48 y.o. very pleasant male patient who presents with the following:  Cellulitis and cat bite on Sunday. Left arm.  This is his cat, he has had it for approximately 10 years, and he has never been outside.  He did have a penetrating bite on Sunday on the volar aspect of his forearm.  Over the last 24 hours, this has become mildly tender.  He has been able to express a small amount of pus, but there is no surrounding induration, no surrounding redness, and is not having any fever.  Complications include end-stage renal disease on peritoneal dialysis.  Tdap  No allergies.  Has not been to the vet in a long time.   ESRD with dialysis - PD at home.  Past Medical History, Surgical History, Social History, Family History, Problem List, Medications, and Allergies have been reviewed and updated if relevant.  Patient Active Problem List   Diagnosis Date Noted  . ESRD (end stage renal disease) ([place]) 11/12/2017  . Varicose veins of left lower extremity with complications 27/51/7001  . Gout 11/04/2008  . 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 Fibula fracture    ORIF 1990  . Gout    non crystal proven   . History of chickenpox   . History of kidney disease   . HTN (hypertension)   . Nephropathy    Ig A; dx 2002. micropscopic hematuria and proteinuria  . Tibia fracture    ORIF 1990  . Varicocele    L testes  . Venous incompetence    R leg    Past Surgical History:  Procedure Laterality Date   . APPENDECTOMY  1982  . AV FISTULA PLACEMENT Left 06/27/2017   Procedure: LEFT ARM ARTERIOVENOUS (AV) FISTULA CREATION;  Surgeon: Waynetta Sandy, MD;  Location: [facility];  Service: Vascular;  Laterality: Left;  . stab phlebectomy Left 12/17/2016   stab phlebectomy >20 incisions (left leg) by Tinnie Gens MD     Social History   Socioeconomic History  . Marital status: Married    Spouse name: Not on file  . Number of children: Not on file  . Years of education: Not on file  . Highest education level: Not on file  Occupational History  . Not on file  Social Needs  . Financial resource strain: Not on file  . Food insecurity:    Worry: Not on file    Inability: Not on file  . Transportation needs:    Medical: Not on file    Non-medical: Not on file  Tobacco Use  . Smoking status: Never Smoker  . Smokeless tobacco: Never Used  Substance and Sexual Activity  . Alcohol use: No    Alcohol/week: 0.0 standard drinks  . Drug use: No  . Sexual activity: Yes  Lifestyle  . Physical activity:    Days per week: Not on file    Minutes per session: Not on file  . Stress: Not on file  Relationships  .  Social connections:    Talks on phone: Not on file    Gets together: Not on file    Attends religious service: Not on file    Active member of club or organization: Not on file    Attends meetings of clubs or organizations: Not on file    Relationship status: Not on file  . Intimate partner violence:    Fear of current or ex partner: Not on file    Emotionally abused: Not on file    Physically abused: Not on file    Forced sexual activity: Not on file  Other Topics Concern  . Not on file  Social History Narrative   Divorced, primary custody of son; [name]; gets regular exercise.       Pt signed designated party release and gives no access to medical records. Can leave msg on cell [phone].     Family History  Problem Relation Age of Onset  . Hypertension  Unknown        GP  . Stroke Unknown   . Diabetes Unknown        DM - GP   . Cancer Neg Hx   . Heart disease Neg Hx     No Known Allergies  Medication list reviewed and updated in full in [facility].  GEN: cat bite, no fevers, chills. GI: No n/v/d, eating normally Pulm: No SOB Interactive and getting along well at home.  Otherwise, ROS is as per the HPI.  Objective:   BP 120/60   Pulse 67   Temp 98.1 F (36.7 C) (Oral)   Ht 6\' 5"  (1.956 m)   Wt (!) 308 lb 4 oz (139.8 kg)   BMI 36.55 kg/m   GEN: WDWN, NAD, Non-toxic, A & O x 3 HEENT: Atraumatic, Normocephalic. Neck supple. No masses, No LAD. Ears and Nose: No external deformity. CV: RRR, No M/G/R. No JVD. No thrill. No extra heart sounds. PULM: CTA B, no wheezes, crackles, rhonchi. No retractions. No resp. distress. No accessory muscle use. EXTR: No c/c/e NEURO Normal gait.  PSYCH: Normally interactive. Conversant. Not depressed or anxious appearing.  Calm demeanor.   On the left volar aspect of the patient's forearm, approximate middle of his forearm, there were 2 puncture entry wounds.  Immediately adjacent to this area the patient does have some tenderness.  There is no significant surrounding redness or warmth.  A small amount of pus is expressed from 1 of the puncture holes.  A picture was taken, and extensive amount of time was used to try to upload this to the medical record, however I was unable to do so.  Laboratory and Imaging Data:  Assessment and Plan:   Cellulitis of left arm  Cat bite of left forearm, initial encounter - Plan: WOUND CULTURE, Tdap vaccine greater than or equal to 7yo IM  ESRD (end stage renal disease) ([facility])  IgA nephropathy  A small amount of pus was expressed, and I sent this for culture.  I will start him on some Augmentin.  Complicating factors include end-stage renal disease on peritoneal dialysis, placed on peritoneal dialysis dosing of Augmentin.  Other complicating  factors include a cat bite wound in reasonable proximity to the patient's fistula.  Tetanus shot today.  This is the patient's personal cat, and he has not been to the vets office in a while, and rabies is not up-to-date.  I discussed potential rabies infection at length with the patient, and he declines  rabies treatment and declines and pounding his cat.  He understands the potential risk this implicates.  Follow-up: Friday with Mrs. Carlean Purl who viewed this injury at time he was in the office.   Meds ordered this encounter  Medications  . amoxicillin-clavulanate (AUGMENTIN) 250-125 MG tablet    Sig: Take 1 tablet by mouth 2 (two) times daily.    Dispense:  28 tablet    Refill:  0   Orders Placed This Encounter  Procedures  . WOUND CULTURE  . Tdap vaccine greater than or equal to 7yo IM    Signed,  Yemaya Barnier T. Juanelle Trueheart, MD   Outpatient Encounter Medications as of 12/03/2018  Medication Sig  . allopurinol (ZYLOPRIM) 100 MG tablet Take 100 mg by mouth daily as needed (gout).   Marland Kitchen amLODipine (NORVASC) 10 MG tablet Take 10 mg by mouth daily.   Marland Kitchen BYSTOLIC 10 MG tablet Take 10 mg by mouth daily.   . calcium acetate (PHOSLO) 667 MG tablet Take 1 tablet by mouth 3 (three) times daily with meals.  . furosemide (LASIX) 40 MG tablet Take 40 mg by mouth every other day.   . Omega-3 Fatty Acids (FISH OIL PO) Take 3 capsules by mouth daily.  . tamsulosin (FLOMAX) 0.4 MG CAPS capsule TAKE 1 CAPSULE BY MOUTH EVERY DAY  . testosterone cypionate (DEPOTESTOSTERONE CYPIONATE) 200 MG/ML injection Inject 1 mL (200 mg total) into the muscle every 14 (fourteen) days. MUST SCHEDULE ANNUAL EXAM  . amoxicillin-clavulanate (AUGMENTIN) 250-125 MG tablet Take 1 tablet by mouth 2 (two) times daily.  . [DISCONTINUED] ciprofloxacin (CIPRO) 500 MG tablet Take 1 tablet by mouth 2 (two) times daily.   No facility-administered encounter medications on file as of 12/03/2018.

## 2018-12-04 ENCOUNTER — Encounter: Payer: Self-pay | Admitting: Family Medicine

## 2018-12-05 ENCOUNTER — Ambulatory Visit (INDEPENDENT_AMBULATORY_CARE_PROVIDER_SITE_OTHER): Payer: 59 | Admitting: Family Medicine

## 2018-12-05 ENCOUNTER — Encounter: Payer: Self-pay | Admitting: Family Medicine

## 2018-12-05 VITALS — BP 132/74 | HR 77 | Wt 306.8 lb

## 2018-12-05 DIAGNOSIS — L03114 Cellulitis of left upper limb: Secondary | ICD-10-CM

## 2018-12-05 NOTE — Patient Instructions (Signed)
Good to see you today  Continue to monitor for fever/chills, streaking, drainage

## 2018-12-05 NOTE — Progress Notes (Signed)
Subjective:    Patient ID: Eric Douglas, male    DOB: 12/09/1970, 48 y.o.   MRN: [id number]  HPI This is a 48 yo male who presents today for follow up of right forearm cat bite.  Was seen 2 days ago and started on augmentin. Tdap given. Tolerating well. No fever or chills, no nausea or vomiting, wound was draining but isn't now. Swelling has gone down.   Past Medical History:  Diagnosis Date  . Fibula fracture    ORIF 1990  . Gout    non crystal proven   . History of chickenpox   . History of kidney disease   . HTN (hypertension)   . Nephropathy    Ig A; dx 2002. micropscopic hematuria and proteinuria  . Tibia fracture    ORIF 1990  . Varicocele    L testes  . Venous incompetence    R leg   Past Surgical History:  Procedure Laterality Date  . APPENDECTOMY  1982  . AV FISTULA PLACEMENT Left 06/27/2017   Procedure: LEFT ARM ARTERIOVENOUS (AV) FISTULA CREATION;  Surgeon: Waynetta Sandy, MD;  Location: [facility];  Service: Vascular;  Laterality: Left;  . stab phlebectomy Left 12/17/2016   stab phlebectomy >20 incisions (left leg) by Tinnie Gens MD    Family History  Problem Relation Age of Onset  . Hypertension Unknown        GP  . Stroke Unknown   . Diabetes Unknown        DM - GP   . Cancer Neg Hx   . Heart disease Neg Hx    Social History   Tobacco Use  . Smoking status: Never Smoker  . Smokeless tobacco: Never Used  Substance Use Topics  . Alcohol use: No    Alcohol/week: 0.0 standard drinks  . Drug use: No      Review of Systems Per HPI    Objective:   Physical Exam Vitals signs reviewed.  Constitutional:      General: He is not in acute distress.    Appearance: Normal appearance. He is obese. He is not ill-appearing, toxic-appearing or diaphoretic.  HENT:     Head: Normocephalic and atraumatic.  Eyes:     Conjunctiva/sclera: Conjunctivae normal.  Cardiovascular:     Rate and Rhythm: Normal rate.  Pulmonary:     Effort:  Pulmonary effort is normal.  Skin:    General: Skin is warm and dry.     Comments: Left forearm with approximately 3 cm round area of erythema and swelling. Two small puncture wounds, both with scabbing. No drainage, no streaking, minimal warmth. Improved from 2 days ago.   Neurological:     Mental Status: He is alert and oriented to person, place, and time.  Psychiatric:        Mood and Affect: Mood normal.        Behavior: Behavior normal.        Thought Content: Thought content normal.        Judgment: Judgment normal.      BP 132/74 (BP Location: Right Arm, Patient Position: Sitting, Cuff Size: Large)   Pulse 77   Wt (!) 306 lb 12.8 oz (139.2 kg)   SpO2 91%   BMI 36.38 kg/m       Assessment & Plan:  1. Cellulitis of left upper extremity - improved - finish antibiotic - RTC/ER precautions reviewed with patient   Clarene Reamer, FNP-BC  [facility]  12/05/2018 5:09 PM

## 2018-12-06 LAB — WOUND CULTURE
MICRO NUMBER: 215510
SPECIMEN QUALITY: ADEQUATE

## 2018-12-13 DIAGNOSIS — N186 End stage renal disease: Secondary | ICD-10-CM | POA: Diagnosis not present

## 2018-12-13 DIAGNOSIS — Z992 Dependence on renal dialysis: Secondary | ICD-10-CM | POA: Diagnosis not present

## 2018-12-13 DIAGNOSIS — N028 Recurrent and persistent hematuria with other morphologic changes: Secondary | ICD-10-CM | POA: Diagnosis not present

## 2019-01-08 ENCOUNTER — Other Ambulatory Visit: Payer: Self-pay | Admitting: Internal Medicine

## 2019-01-08 NOTE — Telephone Encounter (Signed)
Will take care of on Monday

## 2019-01-08 NOTE — Telephone Encounter (Signed)
Yes need a testosterone level.

## 2019-01-08 NOTE — Telephone Encounter (Signed)
No testosterone labs since 2018 in chart, please advise if you would like to have update lab before CPE

## 2019-01-21 DIAGNOSIS — Z7682 Awaiting organ transplant status: Secondary | ICD-10-CM | POA: Diagnosis not present

## 2019-02-19 DIAGNOSIS — Z01818 Encounter for other preprocedural examination: Secondary | ICD-10-CM | POA: Diagnosis not present

## 2019-03-13 DIAGNOSIS — Z79899 Other long term (current) drug therapy: Secondary | ICD-10-CM | POA: Diagnosis not present

## 2019-03-13 DIAGNOSIS — Z1159 Encounter for screening for other viral diseases: Secondary | ICD-10-CM | POA: Diagnosis not present

## 2019-03-13 DIAGNOSIS — Z01810 Encounter for preprocedural cardiovascular examination: Secondary | ICD-10-CM | POA: Diagnosis not present

## 2019-03-13 DIAGNOSIS — Z01818 Encounter for other preprocedural examination: Secondary | ICD-10-CM | POA: Diagnosis not present

## 2019-05-22 DIAGNOSIS — Z7682 Awaiting organ transplant status: Secondary | ICD-10-CM | POA: Diagnosis not present

## 2019-06-14 ENCOUNTER — Encounter (HOSPITAL_BASED_OUTPATIENT_CLINIC_OR_DEPARTMENT_OTHER): Payer: Self-pay

## 2019-06-14 ENCOUNTER — Other Ambulatory Visit: Payer: Self-pay

## 2019-06-14 ENCOUNTER — Emergency Department (HOSPITAL_BASED_OUTPATIENT_CLINIC_OR_DEPARTMENT_OTHER)
Admission: EM | Admit: 2019-06-14 | Discharge: 2019-06-14 | Disposition: A | Discharge status: Home / Self Care | Payer: 59 | Attending: Emergency Medicine | Admitting: Emergency Medicine

## 2019-06-14 ENCOUNTER — Emergency Department (HOSPITAL_BASED_OUTPATIENT_CLINIC_OR_DEPARTMENT_OTHER): Payer: 59

## 2019-06-14 DIAGNOSIS — R319 Hematuria, unspecified: Secondary | ICD-10-CM | POA: Insufficient documentation

## 2019-06-14 DIAGNOSIS — I120 Hypertensive chronic kidney disease with stage 5 chronic kidney disease or end stage renal disease: Secondary | ICD-10-CM | POA: Insufficient documentation

## 2019-06-14 DIAGNOSIS — R109 Unspecified abdominal pain: Secondary | ICD-10-CM | POA: Diagnosis present

## 2019-06-14 DIAGNOSIS — N186 End stage renal disease: Secondary | ICD-10-CM | POA: Diagnosis not present

## 2019-06-14 DIAGNOSIS — Z79899 Other long term (current) drug therapy: Secondary | ICD-10-CM | POA: Diagnosis not present

## 2019-06-14 DIAGNOSIS — Z992 Dependence on renal dialysis: Secondary | ICD-10-CM | POA: Insufficient documentation

## 2019-06-14 LAB — CBC WITH DIFFERENTIAL/PLATELET
Abs Immature Granulocytes: 0.04 10*3/uL (ref 0.00–0.07)
Basophils Absolute: 0.1 10*3/uL (ref 0.0–0.1)
Basophils Relative: 1 %
Eosinophils Absolute: 0.5 10*3/uL (ref 0.0–0.5)
Eosinophils Relative: 7 %
HCT: 31.2 % — ABNORMAL LOW (ref 39.0–52.0)
Hemoglobin: 10.5 g/dL — ABNORMAL LOW (ref 13.0–17.0)
Immature Granulocytes: 1 %
Lymphocytes Relative: 13 %
Lymphs Abs: 1 10*3/uL (ref 0.7–4.0)
MCH: 33 pg (ref 26.0–34.0)
MCHC: 33.7 g/dL (ref 30.0–36.0)
MCV: 98.1 fL (ref 80.0–100.0)
Monocytes Absolute: 0.5 10*3/uL (ref 0.1–1.0)
Monocytes Relative: 7 %
Neutro Abs: 5.4 10*3/uL (ref 1.7–7.7)
Neutrophils Relative %: 71 %
Platelets: 139 10*3/uL — ABNORMAL LOW (ref 150–400)
RBC: 3.18 MIL/uL — ABNORMAL LOW (ref 4.22–5.81)
RDW: 15 % (ref 11.5–15.5)
WBC: 7.5 10*3/uL (ref 4.0–10.5)
nRBC: 0 % (ref 0.0–0.2)

## 2019-06-14 LAB — URINALYSIS, MICROSCOPIC (REFLEX)

## 2019-06-14 LAB — COMPREHENSIVE METABOLIC PANEL
ALT: 18 U/L (ref 0–44)
AST: 16 U/L (ref 15–41)
Albumin: 3.8 g/dL (ref 3.5–5.0)
Alkaline Phosphatase: 59 U/L (ref 38–126)
Anion gap: 14 (ref 5–15)
BUN: 75 mg/dL — ABNORMAL HIGH (ref 6–20)
CO2: 21 mmol/L — ABNORMAL LOW (ref 22–32)
Calcium: 9.5 mg/dL (ref 8.9–10.3)
Chloride: 103 mmol/L (ref 98–111)
Creatinine, Ser: 17.18 mg/dL — ABNORMAL HIGH (ref 0.61–1.24)
GFR calc Af Amer: 3 mL/min — ABNORMAL LOW (ref >60)
GFR calc non Af Amer: 3 mL/min — ABNORMAL LOW (ref >60)
Glucose, Bld: 104 mg/dL — ABNORMAL HIGH (ref 70–99)
Potassium: 4.4 mmol/L (ref 3.5–5.1)
Sodium: 138 mmol/L (ref 135–145)
Total Bilirubin: 0.5 mg/dL (ref 0.3–1.2)
Total Protein: 6.7 g/dL (ref 6.5–8.1)

## 2019-06-14 LAB — URINALYSIS, ROUTINE W REFLEX MICROSCOPIC
Bilirubin Urine: NEGATIVE
Glucose, UA: NEGATIVE mg/dL
Ketones, ur: NEGATIVE mg/dL
Leukocytes,Ua: NEGATIVE
Nitrite: NEGATIVE
Protein, ur: 100 mg/dL — AB
Specific Gravity, Urine: 1.025 (ref 1.005–1.030)
pH: 5.5 (ref 5.0–8.0)

## 2019-06-14 LAB — LIPASE, BLOOD: Lipase: 43 U/L (ref 11–51)

## 2019-06-14 NOTE — ED Triage Notes (Signed)
Pt states awoke 5am with low abdominal pain over bladder, peritoneal dialysis pt produces very little urine.  Denies fevers. Nausea, vomiting this morning.

## 2019-06-14 NOTE — ED Notes (Signed)
Pt aware of need for urine specimen. 

## 2019-06-14 NOTE — ED Notes (Signed)
MD at bedside. 

## 2019-06-14 NOTE — ED Triage Notes (Signed)
Pt took tylenol and ibuprofen this morning with some improvement in pain.

## 2019-06-14 NOTE — ED Notes (Signed)
Patient transported to X-ray 

## 2019-06-14 NOTE — ED Provider Notes (Signed)
[facility] EMERGENCY DEPARTMENT Provider Note   CSN: [id number] Arrival date & time: 06/14/19  4132     History   Chief Complaint Chief Complaint  Patient presents with  . Abdominal Pain    HPI Eric Douglas is a 48 y.o. male.     HPI Patient presents with abdominal pain.  Began around 4 hours prior to arrival.  States it is in the lower abdomen.  No fevers.  He vomited without relief.  States he urinated and had bowel movement without relief of the pain.  Pain was severe.  He is a peritoneal dialysis patient and states he took out his fluid which also did not change pain.  States that fluid is not cloudy.  States he makes about 1000 cc of urine a day.  States he has had some difficulty urinating.  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  Pain somewhat improved now after ibuprofen and Tylenol. Past Medical History:  Diagnosis Date  . Fibula fracture    ORIF 1990  . Gout    non crystal proven   . History of chickenpox   . History of kidney disease   . HTN (hypertension)   . Nephropathy    Ig A; dx 2002. micropscopic hematuria and proteinuria  . Tibia fracture    ORIF 1990  . Varicocele    L testes  . Venous incompetence    R leg    Patient Active Problem List   Diagnosis Date Noted  . ESRD (end stage renal disease) ([facility]) 11/12/2017  . Varicose veins of left lower extremity with complications 44/10/270  . Gout 11/04/2008  . 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 IgA nephropathy 11/04/2008    Past Surgical History:  Procedure Laterality Date  . APPENDECTOMY  1982  . AV FISTULA PLACEMENT Left 06/27/2017   Procedure: LEFT ARM ARTERIOVENOUS (AV) FISTULA CREATION;  Surgeon: Waynetta Sandy, MD;  Location: [facility];  Service: Vascular;  Laterality: Left;  . stab phlebectomy Left 12/17/2016   stab phlebectomy >20 incisions (left leg) by Tinnie Gens MD         Home Medications    Prior to Admission medications    Medication Sig Start Date End Date Taking? Authorizing Provider  allopurinol (ZYLOPRIM) 100 MG tablet Take 100 mg by mouth daily as needed (gout).  11/16/15   [provider]  amLODipine (NORVASC) 10 MG tablet Take 10 mg by mouth daily.  03/13/17   [provider]  amoxicillin-clavulanate (AUGMENTIN) 250-125 MG tablet Take 1 tablet by mouth 2 (two) times daily. 12/03/18   Copland, Frederico Hamman, MD  BYSTOLIC 10 MG tablet Take 10 mg by mouth daily.  05/22/17   [provider]  calcium acetate (PHOSLO) 667 MG tablet Take 1 tablet by mouth 3 (three) times daily with meals. 12/24/17   [provider]  furosemide (LASIX) 40 MG tablet Take 40 mg by mouth every other day.  03/13/17   [provider]  Omega-3 Fatty Acids (FISH OIL PO) Take 3 capsules by mouth daily.    [provider]  tamsulosin (FLOMAX) 0.4 MG CAPS capsule TAKE 1 CAPSULE BY MOUTH EVERY DAY 02/10/18   Jearld Fenton, NP  testosterone cypionate (DEPOTESTOSTERONE CYPIONATE) 200 MG/ML injection INJECT 1 ML (200 MG TOTAL) INTO THE MUSCLE EVERY 14 (FOURTEEN) DAYS. MUST SCHEDULE ANNUAL EXAM 01/08/19   Jearld Fenton, NP    Family History Family History  Problem Relation Age of  Onset  . Hypertension Unknown        GP  . Stroke Unknown   . Diabetes Unknown        DM - GP   . Cancer Neg Hx   . Heart disease Neg Hx     Social History Social History   Tobacco Use  . Smoking status: Never Smoker  . Smokeless tobacco: Never Used  Substance Use Topics  . Alcohol use: No    Alcohol/week: 0.0 standard drinks  . Drug use: No     Allergies   Patient has no known allergies.   Review of Systems Review of Systems  Constitutional: Negative for appetite change.  HENT: Negative for congestion.   Respiratory: Negative for shortness of breath.   Gastrointestinal: Positive for abdominal pain, nausea and vomiting.  Genitourinary: Positive for difficulty urinating. Negative for enuresis.   Musculoskeletal: Negative for back pain.  Skin: Negative for rash.  Neurological: Negative for weakness.  Psychiatric/Behavioral: Negative for confusion.     Physical Exam Updated Vital Signs BP 133/90 (BP Location: Right Arm)   Pulse 63   Temp 98.2 F (36.8 C) (Oral)   Resp 18   Ht 6\' 5"  (1.956 m)   Wt 135.2 kg   SpO2 100%   BMI 35.34 kg/m   Physical Exam Vitals signs and nursing note reviewed.  HENT:     Head: Atraumatic.  Cardiovascular:     Rate and Rhythm: Regular rhythm.  Pulmonary:     Breath sounds: Normal breath sounds.  Abdominal:     Hernia: No hernia is present.     Comments: Peritoneal dialysis catheter left upper abdomen.  No erythema or drainage.  Scar right lower abdomen from previous appendectomy.  Mild lower abdominal tenderness without rebound or guarding.  Skin:    General: Skin is warm.     Capillary Refill: Capillary refill takes less than 2 seconds.  Neurological:     Mental Status: He is alert and oriented to person, place, and time.      ED Treatments / Results  Labs (all labs ordered are listed, but only abnormal results are displayed) Labs Reviewed  COMPREHENSIVE METABOLIC PANEL - Abnormal; Notable for the following components:      Result Value   CO2 21 (*)    Glucose, Bld 104 (*)    BUN 75 (*)    Creatinine, Ser 17.18 (*)    GFR calc non Af Amer 3 (*)    GFR calc Af Amer 3 (*)    All other components within normal limits  CBC WITH DIFFERENTIAL/PLATELET - Abnormal; Notable for the following components:   RBC 3.18 (*)    Hemoglobin 10.5 (*)    HCT 31.2 (*)    Platelets 139 (*)    All other components within normal limits  URINALYSIS, ROUTINE W REFLEX MICROSCOPIC - Abnormal; Notable for the following components:   Hgb urine dipstick MODERATE (*)    Protein, ur 100 (*)    All other components within normal limits  URINALYSIS, MICROSCOPIC (REFLEX) - Abnormal; Notable for the following components:   Bacteria, UA MANY (*)    All  other components within normal limits  LIPASE, BLOOD    EKG None  Radiology Dg Abd 2 Views  Result Date: 06/14/2019 CLINICAL DATA:  Nephropathy on peritoneal dialysis. Suprapubic pain. EXAM: ABDOMEN - 2 VIEW COMPARISON:  01/21/2018 CT abdomen/pelvis FINDINGS: Peritoneal dialysis catheter terminates over medial left abdomen. No dilated small bowel loops. Mild colonic  stool volume. No evidence of pneumatosis or pneumoperitoneum. No radiopaque nephrolithiasis. Clear lung bases. IMPRESSION: Nonobstructive bowel gas pattern. Electronically Signed   By: Ilona Sorrel M.D.   On: 06/14/2019 10:09    Procedures Procedures (including critical care time)  Medications Ordered in ED Medications - No data to display   Initial Impression / Assessment and Plan / ED Course  I have reviewed the triage vital signs and the nursing notes.  Pertinent labs & imaging results that were available during my care of the patient were reviewed by me and considered in my medical decision making (see chart for details).        Patient with abdominal pain.  On peritoneal dialysis.  Lab work overall reassuring.  However did start some blood in the urine.  Pain was never up in his flank.  Much improved now.  Potentially could have passed a stone.  Discussed with patient and will hold off on CT at this time.  Do not think this is a peritonitis.  However if pain returns will require CT scan.  We will follow-up with outpatient provide  Final Clinical Impressions(s) / ED Diagnoses   Final diagnoses:  Abdominal pain  Abdominal pain, unspecified abdominal location    ED Discharge Orders    None       Davonna Belling, MD 06/14/19 1122

## 2019-06-23 ENCOUNTER — Other Ambulatory Visit: Payer: Self-pay | Admitting: Internal Medicine

## 2019-06-24 NOTE — Telephone Encounter (Signed)
Last filled 12/2018 15mL with 1 refill... Rx changed to 82mL and note added requesting patient schedule CPE... please advise

## 2019-07-24 ENCOUNTER — Encounter: Payer: Self-pay | Admitting: Internal Medicine

## 2019-07-24 ENCOUNTER — Ambulatory Visit (INDEPENDENT_AMBULATORY_CARE_PROVIDER_SITE_OTHER): Payer: 59 | Admitting: Internal Medicine

## 2019-07-24 ENCOUNTER — Other Ambulatory Visit: Payer: Self-pay

## 2019-07-24 VITALS — BP 130/80 | HR 73 | Temp 98.8°F | Wt 326.0 lb

## 2019-07-24 DIAGNOSIS — S0086XA Insect bite (nonvenomous) of other part of head, initial encounter: Secondary | ICD-10-CM | POA: Diagnosis not present

## 2019-07-24 DIAGNOSIS — W57XXXA Bitten or stung by nonvenomous insect and other nonvenomous arthropods, initial encounter: Secondary | ICD-10-CM | POA: Diagnosis not present

## 2019-07-24 DIAGNOSIS — Z23 Encounter for immunization: Secondary | ICD-10-CM

## 2019-07-24 MED ORDER — CEFTRIAXONE SODIUM 250 MG IJ SOLR
250.0000 mg | Freq: Once | INTRAMUSCULAR | Status: AC
  Administered 2019-07-24: 250 mg via INTRAMUSCULAR

## 2019-07-24 NOTE — Progress Notes (Signed)
Subjective:    Patient ID: Eric Douglas, male    DOB: Oct 31, 1970, 48 y.o.   MRN: [id number]  HPI  Pt presents to the clinic today with c/o a red spot on his face. He noticed this 3 days ago when he woke up. He reports the area has gotten bigger in size. He has noticed some clear, blood tinged fluid leaking from the area. He tried to pop the lesion but reports it would not pop.   Review of Systems  Past Medical History:  Diagnosis Date  . Fibula fracture    ORIF 1990  . Gout    non crystal proven   . History of chickenpox   . History of kidney disease   . HTN (hypertension)   . Nephropathy    Ig A; dx 2002. micropscopic hematuria and proteinuria  . Tibia fracture    ORIF 1990  . Varicocele    L testes  . Venous incompetence    R leg    Current Outpatient Medications  Medication Sig Dispense Refill  . allopurinol (ZYLOPRIM) 100 MG tablet Take 100 mg by mouth daily as needed (gout).   2  . amLODipine (NORVASC) 10 MG tablet Take 10 mg by mouth daily.     Marland Kitchen BYSTOLIC 10 MG tablet Take 10 mg by mouth daily.     . calcium acetate (PHOSLO) 667 MG tablet Take 1 tablet by mouth 3 (three) times daily with meals.    . furosemide (LASIX) 40 MG tablet Take 40 mg by mouth every other day.     . Omega-3 Fatty Acids (FISH OIL PO) Take 3 capsules by mouth daily.    . tamsulosin (FLOMAX) 0.4 MG CAPS capsule TAKE 1 CAPSULE BY MOUTH EVERY DAY 30 capsule 0  . testosterone cypionate (DEPOTESTOSTERONE CYPIONATE) 200 MG/ML injection INJECT 1 ML (200 MG TOTAL) INTO THE MUSCLE EVERY 14 (FOURTEEN) DAYS. MUST SCHEDULE ANNUAL EXAM 2 mL 0   No current facility-administered medications for this visit.     No Known Allergies  Family History  Problem Relation Age of Onset  . Hypertension Unknown        GP  . Stroke Unknown   . Diabetes Unknown        DM - GP   . Cancer Neg Hx   . Heart disease Neg Hx     Social History   Socioeconomic History  . Marital status: Married    Spouse  name: Not on file  . Number of children: Not on file  . Years of education: Not on file  . Highest education level: Not on file  Occupational History  . Not on file  Social Needs  . Financial resource strain: Not on file  . Food insecurity    Worry: Not on file    Inability: Not on file  . Transportation needs    Medical: Not on file    Non-medical: Not on file  Tobacco Use  . Smoking status: Never Smoker  . Smokeless tobacco: Never Used  Substance and Sexual Activity  . Alcohol use: No    Alcohol/week: 0.0 standard drinks  . Drug use: No  . Sexual activity: Yes  Lifestyle  . Physical activity    Days per week: Not on file    Minutes per session: Not on file  . Stress: Not on file  Relationships  . Social Herbalist on phone: Not on file    Gets together: Not on  file    Attends religious service: Not on file    Active member of club or organization: Not on file    Attends meetings of clubs or organizations: Not on file    Relationship status: Not on file  . Intimate partner violence    Fear of current or ex partner: Not on file    Emotionally abused: Not on file    Physically abused: Not on file    Forced sexual activity: Not on file  Other Topics Concern  . Not on file  Social History Narrative   Divorced, primary custody of son; Eric Douglas; gets regular exercise.       Pt signed designated party release and gives no access to medical records. Can leave msg on cell [phone].      Constitutional: Denies fever, malaise, fatigue, headache or abrupt weight changes.  Skin: Pt reports red spot on face. Denies ulcercations.    No other specific complaints in a complete review of systems (except as listed in HPI above).     Objective:   Physical Exam  BP 130/80   Pulse 73   Temp 98.8 F (37.1 C) (Temporal)   Wt (!) 326 lb (147.9 kg)   SpO2 98%   BMI 38.66 kg/m  Wt Readings from Last 3 Encounters:  07/24/19 (!) 326 lb (147.9 kg)   06/14/19 298 lb (135.2 kg)  12/05/18 (!) 306 lb 12.8 oz (139.2 kg)    General: Appears his stated age, obese, in NAD. Skin:  1 cm inflamed papule of left check. Cardiovascular: Normal rate and rhythm. Pulmonary/Chest: Normal effort and positive vesicular breath sounds. No respiratory distress. No wheezes, rales or ronchi noted.  Neurological: Alert and oriented.  BMET    Component Value Date/Time   NA 138 06/14/2019 0919   K 4.4 06/14/2019 0919   CL 103 06/14/2019 0919   CO2 21 (L) 06/14/2019 0919   GLUCOSE 104 (H) 06/14/2019 0919   BUN 75 (H) 06/14/2019 0919   CREATININE 17.18 (H) 06/14/2019 0919   CALCIUM 9.5 06/14/2019 0919   GFRNONAA 3 (L) 06/14/2019 0919   GFRAA 3 (L) 06/14/2019 0919    Lipid Panel     Component Value Date/Time   CHOL 114 06/07/2017 0817   TRIG 99.0 06/07/2017 0817   HDL 29.90 (L) 06/07/2017 0817   CHOLHDL 4 06/07/2017 0817   VLDL 19.8 06/07/2017 0817   LDLCALC 65 06/07/2017 0817    CBC    Component Value Date/Time   WBC 7.5 06/14/2019 0919   RBC 3.18 (L) 06/14/2019 0919   HGB 10.5 (L) 06/14/2019 0919   HCT 31.2 (L) 06/14/2019 0919   PLT 139 (L) 06/14/2019 0919   MCV 98.1 06/14/2019 0919   MCH 33.0 06/14/2019 0919   MCHC 33.7 06/14/2019 0919   RDW 15.0 06/14/2019 0919   LYMPHSABS 1.0 06/14/2019 0919   MONOABS 0.5 06/14/2019 0919   EOSABS 0.5 06/14/2019 0919   BASOSABS 0.1 06/14/2019 0919    Hgb A1C Lab Results  Component Value Date   HGBA1C 5.1 06/07/2017           Assessment & Plan:   Infected Insect Bite of Face:  Rocephin 500 mg IM x 1  Neosporin BID Warm compresses to encourage drainage  Return precautions discussed Webb Silversmith, NP

## 2019-07-24 NOTE — Addendum Note (Signed)
Addended by: Lurlean Nanny on: 07/24/2019 03:48 PM   Modules accepted: Orders

## 2019-07-24 NOTE — Patient Instructions (Signed)

## 2019-10-27 ENCOUNTER — Encounter (HOSPITAL_BASED_OUTPATIENT_CLINIC_OR_DEPARTMENT_OTHER): Payer: Self-pay | Admitting: *Deleted

## 2019-10-27 ENCOUNTER — Emergency Department (HOSPITAL_BASED_OUTPATIENT_CLINIC_OR_DEPARTMENT_OTHER): Payer: 59

## 2019-10-27 ENCOUNTER — Emergency Department (HOSPITAL_BASED_OUTPATIENT_CLINIC_OR_DEPARTMENT_OTHER)
Admission: EM | Admit: 2019-10-27 | Discharge: 2019-10-27 | Disposition: A | Discharge status: Home / Self Care | Payer: 59 | Attending: Emergency Medicine | Admitting: Emergency Medicine

## 2019-10-27 ENCOUNTER — Telehealth: Payer: Self-pay

## 2019-10-27 ENCOUNTER — Other Ambulatory Visit: Payer: Self-pay

## 2019-10-27 DIAGNOSIS — Z79899 Other long term (current) drug therapy: Secondary | ICD-10-CM | POA: Diagnosis not present

## 2019-10-27 DIAGNOSIS — I120 Hypertensive chronic kidney disease with stage 5 chronic kidney disease or end stage renal disease: Secondary | ICD-10-CM | POA: Insufficient documentation

## 2019-10-27 DIAGNOSIS — Z992 Dependence on renal dialysis: Secondary | ICD-10-CM | POA: Insufficient documentation

## 2019-10-27 DIAGNOSIS — Z20822 Contact with and (suspected) exposure to covid-19: Secondary | ICD-10-CM | POA: Insufficient documentation

## 2019-10-27 DIAGNOSIS — N186 End stage renal disease: Secondary | ICD-10-CM | POA: Insufficient documentation

## 2019-10-27 DIAGNOSIS — R091 Pleurisy: Secondary | ICD-10-CM | POA: Insufficient documentation

## 2019-10-27 DIAGNOSIS — K668 Other specified disorders of peritoneum: Secondary | ICD-10-CM

## 2019-10-27 DIAGNOSIS — R1011 Right upper quadrant pain: Secondary | ICD-10-CM | POA: Diagnosis not present

## 2019-10-27 DIAGNOSIS — R918 Other nonspecific abnormal finding of lung field: Secondary | ICD-10-CM | POA: Diagnosis not present

## 2019-10-27 DIAGNOSIS — R109 Unspecified abdominal pain: Secondary | ICD-10-CM | POA: Diagnosis not present

## 2019-10-27 DIAGNOSIS — R0789 Other chest pain: Secondary | ICD-10-CM | POA: Diagnosis present

## 2019-10-27 LAB — CBC WITH DIFFERENTIAL/PLATELET
Abs Immature Granulocytes: 0.04 10*3/uL (ref 0.00–0.07)
Basophils Absolute: 0.1 10*3/uL (ref 0.0–0.1)
Basophils Relative: 1 %
Eosinophils Absolute: 0.1 10*3/uL (ref 0.0–0.5)
Eosinophils Relative: 1 %
HCT: 31.7 % — ABNORMAL LOW (ref 39.0–52.0)
Hemoglobin: 10.5 g/dL — ABNORMAL LOW (ref 13.0–17.0)
Immature Granulocytes: 0 %
Lymphocytes Relative: 12 %
Lymphs Abs: 1.3 10*3/uL (ref 0.7–4.0)
MCH: 33.5 pg (ref 26.0–34.0)
MCHC: 33.1 g/dL (ref 30.0–36.0)
MCV: 101.3 fL — ABNORMAL HIGH (ref 80.0–100.0)
Monocytes Absolute: 1 10*3/uL (ref 0.1–1.0)
Monocytes Relative: 9 %
Neutro Abs: 8.6 10*3/uL — ABNORMAL HIGH (ref 1.7–7.7)
Neutrophils Relative %: 77 %
Platelets: 136 10*3/uL — ABNORMAL LOW (ref 150–400)
RBC: 3.13 MIL/uL — ABNORMAL LOW (ref 4.22–5.81)
RDW: 13.4 % (ref 11.5–15.5)
WBC: 11.1 10*3/uL — ABNORMAL HIGH (ref 4.0–10.5)
nRBC: 0 % (ref 0.0–0.2)

## 2019-10-27 LAB — COMPREHENSIVE METABOLIC PANEL
ALT: 16 U/L (ref 0–44)
AST: 10 U/L — ABNORMAL LOW (ref 15–41)
Albumin: 3.7 g/dL (ref 3.5–5.0)
Alkaline Phosphatase: 45 U/L (ref 38–126)
Anion gap: 16 — ABNORMAL HIGH (ref 5–15)
BUN: 83 mg/dL — ABNORMAL HIGH (ref 6–20)
CO2: 21 mmol/L — ABNORMAL LOW (ref 22–32)
Calcium: 8.8 mg/dL — ABNORMAL LOW (ref 8.9–10.3)
Chloride: 94 mmol/L — ABNORMAL LOW (ref 98–111)
Creatinine, Ser: 19.31 mg/dL — ABNORMAL HIGH (ref 0.61–1.24)
GFR calc Af Amer: 3 mL/min — ABNORMAL LOW (ref >60)
GFR calc non Af Amer: 2 mL/min — ABNORMAL LOW (ref >60)
Glucose, Bld: 89 mg/dL (ref 70–99)
Potassium: 5 mmol/L (ref 3.5–5.1)
Sodium: 131 mmol/L — ABNORMAL LOW (ref 135–145)
Total Bilirubin: 0.6 mg/dL (ref 0.3–1.2)
Total Protein: 7 g/dL (ref 6.5–8.1)

## 2019-10-27 LAB — D-DIMER, QUANTITATIVE: D-Dimer, Quant: 0.4 ug/mL-FEU (ref 0.00–0.50)

## 2019-10-27 LAB — TROPONIN I (HIGH SENSITIVITY)
Troponin I (High Sensitivity): 12 ng/L (ref <18)
Troponin I (High Sensitivity): 11 ng/L (ref <18)

## 2019-10-27 LAB — LIPASE, BLOOD: Lipase: 31 U/L (ref 11–51)

## 2019-10-27 LAB — SARS CORONAVIRUS 2 (TAT 6-24 HRS): SARS Coronavirus 2: NEGATIVE

## 2019-10-27 MED ORDER — IBUPROFEN 800 MG PO TABS
800.0000 mg | ORAL_TABLET | Freq: Once | ORAL | Status: AC
  Administered 2019-10-27: 800 mg via ORAL
  Filled 2019-10-27: qty 1

## 2019-10-27 NOTE — ED Notes (Signed)
Pt returned from xray

## 2019-10-27 NOTE — Telephone Encounter (Signed)
Note from [facility] did not go into portal; copy sent for scanning and copy in R Baity NP in box. Per chart review pt did go to [facility] ED.

## 2019-10-27 NOTE — ED Provider Notes (Signed)
[place] EMERGENCY DEPARTMENT Provider Note   CSN: [id number] Arrival date & time: 10/27/19  6578     History Chief Complaint  Patient presents with  . Rib cage pain    Eric Douglas is a 49 y.o. male.  The history is provided by the patient and medical records. No language interpreter was used.   Eric Douglas is a 49 y.o. male who presents to the Emergency Department complaining of right sided chest pain.  Three days ago he noticed pain in his right lower chest, described as sharp and tight pain.  Pain radiates to his right shoulder.  Pain is worse with breathing.  He has no associated fever, cough, abdominal pain.  He sneezed really hard Saturday morning prior to sxs beginning.  Denies any leg swelling or pain.  No change in pain with activity.   He is on peritoneal dialysis nightly.  No change in dialysate quality or sessions.  Kidney disease is due to [facility] nephrology.  He sees Dr. Clover Mealy with Nephrology.  No hx/o DVT/PE.  No known covid19 exposures. Denies tobacco, alcohol, drug use.      Past Medical History:  Diagnosis Date  . Fibula fracture    ORIF 1990  . Gout    non crystal proven   . History of chickenpox   . History of kidney disease   . HTN (hypertension)   . Nephropathy    Ig A; dx 2002. micropscopic hematuria and proteinuria  . Tibia fracture    ORIF 1990  . Varicocele    L testes  . Venous incompetence    R leg    Patient Active Problem List   Diagnosis Date Noted  . ESRD (end stage renal disease) ([place]) 11/12/2017  . Varicose veins of left lower extremity with complications 46/96/2952  . Gout 11/04/2008  . 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 IgA nephropathy 11/04/2008    Past Surgical History:  Procedure Laterality Date  . APPENDECTOMY  1982  . AV FISTULA PLACEMENT Left 06/27/2017   Procedure: LEFT ARM ARTERIOVENOUS (AV) FISTULA CREATION;  Surgeon: Waynetta Sandy, MD;  Location: [place];   Service: Vascular;  Laterality: Left;  . stab phlebectomy Left 12/17/2016   stab phlebectomy >20 incisions (left leg) by Tinnie Gens MD        Family History  Problem Relation Age of Onset  . Hypertension Other        GP  . Stroke Other   . Diabetes Other        DM - GP   . Cancer Neg Hx   . Heart disease Neg Hx     Social History   Tobacco Use  . Smoking status: Never Smoker  . Smokeless tobacco: Never Used  Substance Use Topics  . Alcohol use: No    Alcohol/week: 0.0 standard drinks  . Drug use: No    Home Medications Prior to Admission medications   Medication Sig Start Date End Date Taking? Authorizing Provider  allopurinol (ZYLOPRIM) 100 MG tablet Take 100 mg by mouth daily as needed (gout).  11/16/15  Yes [provider]  amLODipine (NORVASC) 10 MG tablet Take 10 mg by mouth daily.  03/13/17  Yes [provider]  BYSTOLIC 10 MG tablet Take 10 mg by mouth daily.  05/22/17  Yes [provider]  calcium acetate (PHOSLO) 667 MG tablet Take 1 tablet by mouth 3 (three) times daily with meals. 12/24/17  Yes [provider]  furosemide (LASIX) 40 MG tablet Take 40 mg by mouth every other day.  03/13/17  Yes [provider]  gabapentin (NEURONTIN) 100 MG capsule Take 100 mg by mouth 2 (two) times daily.   Yes [provider]  tamsulosin (FLOMAX) 0.4 MG CAPS capsule TAKE 1 CAPSULE BY MOUTH EVERY DAY 02/10/18  Yes Baity, Coralie Keens, NP  testosterone cypionate (DEPOTESTOSTERONE CYPIONATE) 200 MG/ML injection INJECT 1 ML (200 MG TOTAL) INTO THE MUSCLE EVERY 14 (FOURTEEN) DAYS. MUST SCHEDULE ANNUAL EXAM 06/24/19   Jearld Fenton, NP    Allergies    Patient has no allergy information on record.  Review of Systems   Review of Systems  All other systems reviewed and are negative.   Physical Exam Updated Vital Signs BP 111/68 (BP Location: Right Arm)   Pulse 78   Temp 98.2 F (36.8 C) (Oral)   Resp 20   Ht 6\' 5"  (1.956 m)   Wt  (!) 153 kg   SpO2 97%   BMI 40.00 kg/m   Physical Exam Vitals and nursing note reviewed.  Constitutional:      Appearance: He is well-developed.  HENT:     Head: Normocephalic and atraumatic.  Cardiovascular:     Rate and Rhythm: Normal rate and regular rhythm.     Heart sounds: No murmur.  Pulmonary:     Effort: Pulmonary effort is normal. No respiratory distress.     Breath sounds: Normal breath sounds.  Chest:     Chest wall: No tenderness.  Abdominal:     Palpations: Abdomen is soft.     Tenderness: There is no guarding or rebound.     Comments: Moderate right sided abdominal tenderness without guarding or rebound.  PD catheter in LUQ with dressing in place.    Musculoskeletal:        General: No swelling or tenderness.  Skin:    General: Skin is warm and dry.  Neurological:     Mental Status: He is alert and oriented to person, place, and time.  Psychiatric:        Behavior: Behavior normal.     ED Results / Procedures / Treatments   Labs (all labs ordered are listed, but only abnormal results are displayed) Labs Reviewed  COMPREHENSIVE METABOLIC PANEL - Abnormal; Notable for the following components:      Result Value   Sodium 131 (*)    Chloride 94 (*)    CO2 21 (*)    BUN 83 (*)    Creatinine, Ser 19.31 (*)    Calcium 8.8 (*)    AST 10 (*)    GFR calc non Af Amer 2 (*)    GFR calc Af Amer 3 (*)    Anion gap 16 (*)    All other components within normal limits  CBC WITH DIFFERENTIAL/PLATELET - Abnormal; Notable for the following components:   WBC 11.1 (*)    RBC 3.13 (*)    Hemoglobin 10.5 (*)    HCT 31.7 (*)    MCV 101.3 (*)    Platelets 136 (*)    Neutro Abs 8.6 (*)    All other components within normal limits  SARS CORONAVIRUS 2 (TAT 6-24 HRS)  LIPASE, BLOOD  D-DIMER, QUANTITATIVE (NOT AT [facility])  TROPONIN I (HIGH SENSITIVITY)  TROPONIN I (HIGH SENSITIVITY)    EKG EKG Interpretation  Date/Time:  Tuesday October 27 2019 11:37:42  EST Ventricular Rate:  73 PR Interval:  QRS Duration: 86 QT Interval:  396 QTC Calculation: 437 R Axis:   58 Text Interpretation: Sinus rhythm ST elev, probable normal early repol pattern Confirmed by Quintella Reichert [phone]) on 10/27/2019 11:47:57 AM   Radiology CT Abdomen Pelvis Wo Contrast  Result Date: 10/27/2019 CLINICAL DATA:  Right-sided pain. End-stage renal disease with peritoneal dialysis catheter present EXAM: CT ABDOMEN AND PELVIS WITHOUT CONTRAST TECHNIQUE: Multidetector CT imaging of the abdomen and pelvis was performed following the standard protocol without oral or IV contrast. COMPARISON:  July 22, 2019 FINDINGS: Lower chest: There is mild bibasilar atelectasis. Lung bases otherwise are clear. There is a small hiatal hernia. Hepatobiliary: No focal liver lesions are evident on this noncontrast enhanced study. Gallbladder wall is not appreciably thickened. There is no biliary duct dilatation. Pancreas: There is no pancreatic mass or inflammatory focus. Spleen: No splenic lesions are evident. Adrenals/Urinary Tract: Adrenals bilaterally appear normal. Kidneys bilaterally are atrophic. No renal mass or hydronephrosis is evident on either side. There is no evident renal or ureteral calculus on either side. Urinary bladder is midline. The wall of the urinary bladder is mildly thickened. Stomach/Bowel: There is no appreciable bowel wall or mesenteric thickening. The terminal ileum appears normal. There is no evident bowel obstruction. No portal venous air evident. There is pneumoperitoneum which may be secondary to introduction of air via the peritoneal dialysis catheter. No obvious bowel perforation evident. Vascular/Lymphatic: There is no abdominal aortic aneurysm. No vascular lesions are evident on this noncontrast enhanced study. There is no adenopathy in the abdomen or pelvis. Reproductive: Prostate and seminal vesicles appear normal in size and contour. There is a tiny calcification  in the leftward aspect of the prostate. No pelvic mass evident. Other: No appendiceal region inflammation. No evident abscess in the abdomen or pelvis. No ascites. There is a peritoneal dialysis catheter which is present in the mid pelvic region. There is no appreciable fluid surrounding this catheter. There is a small ventral hernia containing only fat. Musculoskeletal: There is moderate spinal stenosis at L3-4 and L4-5 due to diffuse disc protrusion and bony hypertrophy. There are no blastic or lytic bone lesions. There is a degree of degenerative change in each hip joint. No intramuscular lesions are evident. IMPRESSION: 1. Peritoneal dialysis catheter present in mid pelvis. There is pneumoperitoneum. Most likely, this pneumoperitoneum has occurred secondary to air introduced via the peritoneal dialysis catheter. Note that viscus perforation could present in this manner, although there is no bowel wall thickening or bowel obstruction. Close clinical assessment in this regard advised. 2. No evident abscess in the abdomen or pelvis. No ascites or loculated fluid collections. No appendiceal region inflammation. 3. Urinary bladder wall is mildly thickened. Question a degree of cystitis. 4. Atrophic kidneys bilaterally. No hydronephrosis on either side. No renal or ureteral calculus on either side. 5. Small hiatal hernia. Small umbilical hernia containing only fat. 6. Spinal stenosis at L3-4 and L4-5 due to disc protrusion and bony hypertrophy. Electronically Signed   By: Lowella Grip III M.D.   On: 10/27/2019 13:42   DG Chest 2 View  Result Date: 10/27/2019 CLINICAL DATA:  Right-sided chest pain. Rib pain EXAM: CHEST - 2 VIEW COMPARISON:  None. FINDINGS: The heart size and mediastinal contours are within normal limits. Mildly prominent interstitial markings in a perihilar distribution bilaterally. No lobar consolidation. No pleural effusion or pneumothorax. The visualized skeletal structures are  unremarkable. IMPRESSION: 1. Prominent interstitial markings in a perihilar distribution bilaterally could reflect mild edema or atypical/viral  infection. 2. No focal airspace consolidation. Electronically Signed   By: Davina Poke D.O.   On: 10/27/2019 12:25   US Abdomen Limited RUQ  Result Date: 10/27/2019 CLINICAL DATA:  Right upper quadrant pain EXAM: ULTRASOUND ABDOMEN LIMITED RIGHT UPPER QUADRANT COMPARISON:  CT 07/22/2019 FINDINGS: Gallbladder: No gallstones or wall thickening visualized. No sonographic Murphy sign noted by sonographer. Common bile duct: Diameter: 2 mm Liver: No focal lesion identified. Within normal limits in parenchymal echogenicity. Portal vein is patent on color Doppler imaging with normal direction of blood flow towards the liver. Other: Visualized right kidney is atrophic, as seen on prior CT. IMPRESSION: 1. Unremarkable sonographic appearance of the liver and gallbladder. 2. Atrophic right kidney. Electronically Signed   By: Davina Poke D.O.   On: 10/27/2019 12:24    Procedures Procedures (including critical care time)  Medications Ordered in ED Medications  ibuprofen (ADVIL) tablet 800 mg (800 mg Oral Given 10/27/19 1506)    ED Course  I have reviewed the triage vital signs and the nursing notes.  Pertinent labs & imaging results that were available during my care of the patient were reviewed by me and considered in my medical decision making (see chart for details).    MDM Rules/Calculators/A&P                     patient with renal disease on peritoneal dialysis here for evaluation of right sided chest pain. He is non-toxic appearing on evaluation with no respiratory distress. EKG without ischemic changes. Presentation is not consistent with ACS, PE, dissection. He does have a right-sided abdominal tenderness on examination without peritoneal findings. Discussed with Dr. Moshe Cipro regarding potential collection of a dialysis catheter for ruling out  SBP although current presentation would not be typical for SBP. A CT abdomen pelvis was obtained, which demonstrates small volume pneumoperitoneum. No history of air injection through dialysis catheter. History, examination and imaging is not consistent with perforated viscous. Discussed with Dr. Kieth Brightly with general surgery. Given atypical presentation and non-toxic appearance plan to discharge home with close outpatient follow-up and return precautions. Discussed these findings with the patient and he is in agreement with plan.  Final Clinical Impression(s) / ED Diagnoses Final diagnoses:  RUQ abdominal pain  Pleurisy  Pneumoperitoneum    Rx / DC Orders ED Discharge Orders    None       Quintella Reichert, MD 10/27/19 1607

## 2019-10-27 NOTE — Discharge Instructions (Addendum)
Get rechecked immediately if you develop fevers, worsening abdominal pain or new concerning symptoms.

## 2019-10-27 NOTE — ED Notes (Signed)
Patient transported to CT 

## 2019-10-27 NOTE — ED Triage Notes (Signed)
Right rib cage pain radiating to right shoulder since Saturday. Denies injury.  The only thing that he remembers was he sneezed so hard on Saturday.

## 2019-10-28 NOTE — Telephone Encounter (Signed)
noted 

## 2019-11-15 DIAGNOSIS — N186 End stage renal disease: Secondary | ICD-10-CM | POA: Diagnosis not present

## 2019-11-15 DIAGNOSIS — N028 Recurrent and persistent hematuria with other morphologic changes: Secondary | ICD-10-CM | POA: Diagnosis not present

## 2019-11-15 DIAGNOSIS — Z992 Dependence on renal dialysis: Secondary | ICD-10-CM | POA: Diagnosis not present

## 2019-11-18 DIAGNOSIS — Z7682 Awaiting organ transplant status: Secondary | ICD-10-CM | POA: Diagnosis not present

## 2019-12-13 DIAGNOSIS — N028 Recurrent and persistent hematuria with other morphologic changes: Secondary | ICD-10-CM | POA: Diagnosis not present

## 2019-12-13 DIAGNOSIS — Z992 Dependence on renal dialysis: Secondary | ICD-10-CM | POA: Diagnosis not present

## 2019-12-13 DIAGNOSIS — N186 End stage renal disease: Secondary | ICD-10-CM | POA: Diagnosis not present

## 2019-12-28 DIAGNOSIS — N186 End stage renal disease: Secondary | ICD-10-CM | POA: Diagnosis not present

## 2019-12-28 DIAGNOSIS — Z23 Encounter for immunization: Secondary | ICD-10-CM | POA: Diagnosis not present

## 2019-12-28 DIAGNOSIS — Z992 Dependence on renal dialysis: Secondary | ICD-10-CM | POA: Diagnosis not present

## 2020-01-19 DIAGNOSIS — Z992 Dependence on renal dialysis: Secondary | ICD-10-CM | POA: Diagnosis not present

## 2020-01-19 DIAGNOSIS — N186 End stage renal disease: Secondary | ICD-10-CM | POA: Diagnosis not present

## 2020-01-19 DIAGNOSIS — Z23 Encounter for immunization: Secondary | ICD-10-CM | POA: Diagnosis not present

## 2020-02-24 DIAGNOSIS — Z7682 Awaiting organ transplant status: Secondary | ICD-10-CM | POA: Diagnosis not present

## 2020-02-28 ENCOUNTER — Emergency Department (HOSPITAL_BASED_OUTPATIENT_CLINIC_OR_DEPARTMENT_OTHER)
Admission: EM | Admit: 2020-02-28 | Discharge: 2020-02-28 | Disposition: A | Discharge status: Home / Self Care | Payer: 59 | Attending: Emergency Medicine | Admitting: Emergency Medicine

## 2020-02-28 ENCOUNTER — Encounter (HOSPITAL_BASED_OUTPATIENT_CLINIC_OR_DEPARTMENT_OTHER): Payer: Self-pay | Admitting: Emergency Medicine

## 2020-02-28 ENCOUNTER — Other Ambulatory Visit: Payer: Self-pay

## 2020-02-28 DIAGNOSIS — I120 Hypertensive chronic kidney disease with stage 5 chronic kidney disease or end stage renal disease: Secondary | ICD-10-CM | POA: Diagnosis not present

## 2020-02-28 DIAGNOSIS — R070 Pain in throat: Secondary | ICD-10-CM | POA: Diagnosis not present

## 2020-02-28 DIAGNOSIS — U071 COVID-19: Secondary | ICD-10-CM | POA: Diagnosis not present

## 2020-02-28 DIAGNOSIS — Z992 Dependence on renal dialysis: Secondary | ICD-10-CM | POA: Diagnosis not present

## 2020-02-28 DIAGNOSIS — N186 End stage renal disease: Secondary | ICD-10-CM | POA: Diagnosis not present

## 2020-02-28 DIAGNOSIS — J029 Acute pharyngitis, unspecified: Secondary | ICD-10-CM

## 2020-02-28 DIAGNOSIS — Z79899 Other long term (current) drug therapy: Secondary | ICD-10-CM | POA: Insufficient documentation

## 2020-02-28 MED ORDER — PREDNISONE 20 MG PO TABS: 40.0000 mg | ORAL_TABLET | Freq: Every day | ORAL | 0 refills | Status: DC

## 2020-02-28 MED ORDER — HYDROCODONE-ACETAMINOPHEN 5-325 MG PO TABS: ORAL_TABLET | ORAL | 0 refills | Status: DC

## 2020-02-28 MED ORDER — PREDNISONE 50 MG PO TABS
60.0000 mg | ORAL_TABLET | Freq: Once | ORAL | Status: AC
  Administered 2020-02-28: 60 mg via ORAL
  Filled 2020-02-28: qty 1

## 2020-02-28 NOTE — Discharge Instructions (Signed)
Please read and follow all provided instructions.  Your diagnoses today include:  1. Sore throat   2. COVID-19   3. ESRD on peritoneal dialysis [facility])     Tests performed today include:  Vital signs. See below for your results today.   Medications prescribed:   Prednisone - steroid medicine   It is best to take this medication in the morning to prevent sleeping problems. If you are diabetic, monitor your blood sugar closely and stop taking Prednisone if blood sugar is over 300. Take with food to prevent stomach upset.   Home care instructions:  Follow any educational materials contained in this packet.  Follow-up instructions: Please follow-up with your primary care provider as needed for further evaluation of your symptoms.  Return instructions:   Please return to the Emergency Department if you experience worsening symptoms.   Please return if you have any other emergent concerns.  Additional Information:  Your vital signs today were: BP (!) 143/90   Pulse 95   Temp 98.1 F (36.7 C) (Oral)   Resp 20   Ht 6\' 5"  (1.956 m)   Wt 132.5 kg   SpO2 100%   BMI 34.63 kg/m  If your blood pressure (BP) was elevated above 135/85 this visit, please have this repeated by your doctor within one month. ---------------

## 2020-02-28 NOTE — ED Triage Notes (Signed)
Pt dx with COVID on Wed; sts sore throat since then that has gotten progressivly worse

## 2020-02-28 NOTE — ED Provider Notes (Signed)
[place] EMERGENCY DEPARTMENT Provider Note   CSN: [id number] Arrival date & time: 02/28/20  8466     History Chief Complaint  Patient presents with  . Sore Throat  . COVID    Eric Douglas is a 49 y.o. male.  Patient with history of IgA nephropathy on peritoneal dialysis presents the emergency department with severe throat pain in the setting of Covid infection.  Patient was previously fully vaccinated.  He developed a sore throat on 5/12 and tested positive for coronavirus on 5/14.  Sore throat has been persistent.  He has not had any fevers, shortness of breath or cough.  He states that a family member had coronavirus several weeks ago.  He works for [employer].  He states that dialysis nurse has talked with him about monoclonal antibodies and should be contacting him regarding this tomorrow.  He denies any vomiting or diarrhea.  He was able to eat a bowl of soup yesterday but with difficulty due to pain.  OTC meds have been helping minimally.        Past Medical History:  Diagnosis Date  . Fibula fracture    ORIF 1990  . Gout    non crystal proven   . History of chickenpox   . History of kidney disease   . HTN (hypertension)   . Nephropathy    Ig A; dx 2002. micropscopic hematuria and proteinuria  . Tibia fracture    ORIF 1990  . Varicocele    L testes  . Venous incompetence    R leg    Patient Active Problem List   Diagnosis Date Noted  . ESRD (end stage renal disease) ([place]) 11/12/2017  . Varicose veins of left lower extremity with complications 59/93/5701  . Gout 11/04/2008  . 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 IgA nephropathy 11/04/2008    Past Surgical History:  Procedure Laterality Date  . APPENDECTOMY  1982  . AV FISTULA PLACEMENT Left 06/27/2017   Procedure: LEFT ARM ARTERIOVENOUS (AV) FISTULA CREATION;  Surgeon: Waynetta Sandy, MD;  Location: [place];  Service: Vascular;  Laterality: Left;  . stab  phlebectomy Left 12/17/2016   stab phlebectomy >20 incisions (left leg) by Tinnie Gens MD        Family History  Problem Relation Age of Onset  . Hypertension Other        GP  . Stroke Other   . Diabetes Other        DM - GP   . Cancer Neg Hx   . Heart disease Neg Hx     Social History   Tobacco Use  . Smoking status: Never Smoker  . Smokeless tobacco: Never Used  Substance Use Topics  . Alcohol use: No    Alcohol/week: 0.0 standard drinks  . Drug use: No    Home Medications Prior to Admission medications   Medication Sig Start Date End Date Taking? Authorizing Provider  allopurinol (ZYLOPRIM) 100 MG tablet Take 100 mg by mouth daily as needed (gout).  11/16/15   [provider]  amLODipine (NORVASC) 10 MG tablet Take 10 mg by mouth daily.  03/13/17   [provider]  BYSTOLIC 10 MG tablet Take 10 mg by mouth daily.  05/22/17   [provider]  calcium acetate (PHOSLO) 667 MG tablet Take 1 tablet by mouth 3 (three) times daily with meals. 12/24/17   [provider]  furosemide (LASIX) 40 MG tablet Take 40 mg  by mouth every other day.  03/13/17   [provider]  gabapentin (NEURONTIN) 100 MG capsule Take 100 mg by mouth 2 (two) times daily.    [provider]  predniSONE (DELTASONE) 20 MG tablet Take 2 tablets (40 mg total) by mouth daily. 02/28/20   Carlisle Cater, PA-C  tamsulosin (FLOMAX) 0.4 MG CAPS capsule TAKE 1 CAPSULE BY MOUTH EVERY DAY 02/10/18   Jearld Fenton, NP  testosterone cypionate (DEPOTESTOSTERONE CYPIONATE) 200 MG/ML injection INJECT 1 ML (200 MG TOTAL) INTO THE MUSCLE EVERY 14 (FOURTEEN) DAYS. MUST SCHEDULE ANNUAL EXAM 06/24/19   Jearld Fenton, NP    Allergies    Patient has no allergy information on record.  Review of Systems   Review of Systems  Constitutional: Negative for chills, fatigue and fever.  HENT: Positive for sore throat, trouble swallowing and voice change (Slightly hoarse). Negative for  congestion, ear pain, rhinorrhea and sinus pressure.   Eyes: Negative for redness.  Respiratory: Negative for cough and wheezing.   Gastrointestinal: Negative for abdominal pain, diarrhea, nausea and vomiting.  Musculoskeletal: Negative for myalgias and neck stiffness.  Skin: Negative for rash.  Neurological: Negative for headaches.  Hematological: Negative for adenopathy.    Physical Exam Updated Vital Signs BP (!) 143/90   Pulse 95   Temp 98.1 F (36.7 C) (Oral)   Resp 20   Ht 6\' 5"  (1.956 m)   Wt 132.5 kg   SpO2 100%   BMI 34.63 kg/m   Physical Exam Vitals and nursing note reviewed.  Constitutional:      Appearance: He is well-developed.  HENT:     Head: Normocephalic and atraumatic.     Jaw: No trismus.     Right Ear: Tympanic membrane, ear canal and external ear normal.     Left Ear: Tympanic membrane, ear canal and external ear normal.     Nose: Nose normal. No mucosal edema or rhinorrhea.     Mouth/Throat:     Mouth: Mucous membranes are not dry.     Pharynx: Uvula midline. Posterior oropharyngeal erythema (Mild) present. No oropharyngeal exudate or uvula swelling.     Tonsils: No tonsillar abscesses.  Eyes:     General:        Right eye: No discharge.        Left eye: No discharge.     Conjunctiva/sclera: Conjunctivae normal.  Neck:     Comments: Full range of motion of neck without any difficulty. Cardiovascular:     Rate and Rhythm: Normal rate and regular rhythm.     Heart sounds: Normal heart sounds.  Pulmonary:     Effort: Pulmonary effort is normal. No respiratory distress.     Breath sounds: Normal breath sounds. No wheezing or rales.  Abdominal:     Palpations: Abdomen is soft.     Tenderness: There is no abdominal tenderness.     Comments: PD cath in place  Musculoskeletal:     Cervical back: Normal range of motion and neck supple.  Skin:    General: Skin is warm and dry.  Neurological:     Mental Status: He is alert.     ED Results /  Procedures / Treatments   Labs (all labs ordered are listed, but only abnormal results are displayed) Labs Reviewed - No data to display  EKG None  Radiology No results found.  Procedures Procedures (including critical care time)  Medications Ordered in ED Medications  predniSONE (DELTASONE) tablet 60 mg (  has no administration in time range)    ED Course  I have reviewed the triage vital signs and the nursing notes.  Pertinent labs & imaging results that were available during my care of the patient were reviewed by me and considered in my medical decision making (see chart for details).  Patient seen and examined.  Discussed signs of a severe infection which would cause more concern at this point.  Patient does not exhibit any of these.  Will attempt to treat symptoms with prednisone burst.  No antibiotics required at this time.    Vital signs reviewed and are as follows: BP (!) 143/90   Pulse 95   Temp 98.1 F (36.7 C) (Oral)   Resp 20   Ht 6\' 5"  (1.956 m)   Wt 132.5 kg   SpO2 100%   BMI 34.63 kg/m   We discussed signs and symptoms to return including fevers, inability to swallow liquids, difficulty breathing or swelling in the neck, pain with movement of the neck, pain or difficulty with opening the mouth fully.  Patient verbalizes understanding agrees with plan.    MDM Rules/Calculators/A&P                      Patient with positive COVID-19 infection.  Fortunately does not have any respiratory symptoms from this and no fever.  He does have a sore throat.  Pharyngeal exam is overall reassuring with just some mild erythema.  There is no exudates or signs of abscess.  Range of motion of neck without any pain.  I have low suspicion for deep space infection in the neck, epiglottitis.  Patient does not have any trismus.  I do not feel that he needs CT imaging at this time, however we did discuss signs and symptoms which should cause return for further evaluation  Final  Clinical Impression(s) / ED Diagnoses Final diagnoses:  Sore throat  COVID-19  ESRD on peritoneal dialysis [facility])    Rx / DC Orders ED Discharge Orders         Ordered    predniSONE (DELTASONE) 20 MG tablet  Daily     02/28/20 1120           Carlisle Cater, PA-C 02/28/20 1126    Veryl Speak, MD 02/29/20 1205

## 2020-02-29 ENCOUNTER — Telehealth: Payer: Self-pay | Admitting: Unknown Physician Specialty

## 2020-02-29 ENCOUNTER — Telehealth: Payer: Self-pay

## 2020-02-29 ENCOUNTER — Other Ambulatory Visit: Payer: Self-pay | Admitting: Unknown Physician Specialty

## 2020-02-29 DIAGNOSIS — N186 End stage renal disease: Secondary | ICD-10-CM

## 2020-02-29 DIAGNOSIS — U071 COVID-19: Secondary | ICD-10-CM

## 2020-02-29 MED ORDER — SODIUM CHLORIDE 0.9 % IV SOLN
Freq: Once | INTRAVENOUS | Status: AC
  Filled 2020-02-29: qty 700

## 2020-02-29 NOTE — Telephone Encounter (Signed)
[place] Night - Client TELEPHONE ADVICE RECORD [facility] Patient Name: Eric Douglas Gender: Male DOB: Mar 29, 1971 Age: 49 Y 68 M 26 D Return Phone Number: [phone] (Primary) Address: City/State/Zip: [place] [address] Client [facility] Night - Client Client Site [facility] Physician Webb Silversmith - NP Contact Type Call Who Is Calling Patient / Member / Family / Caregiver Call Type Triage / Clinical Relationship To Patient Self Return Phone Number [phone] (Primary) Chief Complaint Sore Throat Reason for Call Symptomatic / Request for [place] states he tested positive for covid last week. He has a sore throat that is so bad he cant swallow or eat. He wants to know if anything could be called in to where he could at least [place]. He mentioned an oral Lidocaine. Translation No Nurse Assessment Nurse: Kathi Ludwig, RN, Leana Roe Date/Time (Eastern Time): 02/28/2020 8:03:54 AM Confirm and document reason for call. If symptomatic, describe symptoms. ---Caller states tested positive for covid Wed. ulcers in throat. Taking Tylenol, Ibuprofen, salt water gargle. Severe sore throat Has the patient had close contact with a person known or suspected to have the novel coronavirus illness OR traveled / lives in area with major community spread (including international travel) in the last 14 days from the onset of symptoms? * If Asymptomatic, screen for exposure and travel within the last 14 days. ---Yes Does the patient have any new or worsening symptoms? ---Yes Will a triage be completed? ---Yes Related visit to physician within the last 2 weeks? ---No Does the PT have any chronic conditions? (i.e. diabetes, asthma, this includes High risk factors for pregnancy, etc.) ---Yes List chronic conditions. ---dialysis Is this a behavioral health or substance abuse call?  ---No Guidelines Guideline Title Affirmed Question Affirmed Notes Nurse Date/Time (Eastern Time) Sore Throat [1] Drooling or spitting out saliva (because can't swallow) AND [2] normal breathing Kathi Ludwig, RN, Leana Roe 02/28/2020 8:06:22 AM PLEASE NOTE: All timestamps contained within this report are represented as [place] Standard Time. CONFIDENTIALTY NOTICE: This fax transmission is intended only for the addressee. It contains information that is legally privileged, confidential or otherwise protected from use or disclosure. If you are not the intended recipient, you are strictly prohibited from reviewing, disclosing, copying using or disseminating any of this information or taking any action in reliance on or regarding this information. If you have received this fax in error, please notify us immediately by telephone so that we can arrange for its return to [place]. Phone: [phone], Toll-Free: [phone], Fax: [phone] Page: 2 of 2 Call Id: [id number] [place]. Time Eilene Ghazi Time) Disposition Final User 02/28/2020 8:07:58 AM Go to ED Now Yes Kathi Ludwig, RN, Minerva Ends Disagree/Comply Comply Caller Understands Yes PreDisposition Did not know what to do Care Advice Given Per Guideline GO TO ED NOW: * You need to be seen in the Emergency Department. * Go to the ED at ___________ [facility] now. Drive carefully. BRING MEDICINES: * Please bring a list of your current medicines when you go to the Emergency Department (ER). CARE ADVICE given per Sore Throat (Adult) guideline. Referrals [facility] - ED

## 2020-02-29 NOTE — Telephone Encounter (Signed)
  I connected by phone with Eric Douglas on 02/29/2020 at 2:24 PM to discuss the potential use of an new treatment for mild to moderate COVID-19 viral infection in non-hospitalized patients.  This patient is a 49 y.o. male that meets the FDA criteria for Emergency Use Authorization of bamlanivimab/etesevimab or casirivimab/imdevimab.  Has a (+) direct SARS-CoV-2 viral test result  Has mild or moderate COVID-19   Is ? 50 years of age and weighs ? 40 kg  Is NOT hospitalized due to COVID-19  Is NOT requiring oxygen therapy or requiring an increase in baseline oxygen flow rate due to COVID-19  Is within 10 days of symptom onset  Has at least one of the high risk factor(s) for progression to severe COVID-19 and/or hospitalization as defined in EUA.  Specific high risk criteria : Chronic Kidney Disease (CKD)   I have spoken and communicated the following to the patient or parent/caregiver:  1. FDA has authorized the emergency use of bamlanivimab/etesevimab and casirivimab\imdevimab for the treatment of mild to moderate COVID-19 in adults and pediatric patients with positive results of direct SARS-CoV-2 viral testing who are 40 years of age and older weighing at least 40 kg, and who are at high risk for progressing to severe COVID-19 and/or hospitalization.  2. The significant known and potential risks and benefits of bamlanivimab/etesevimab and casirivimab\imdevimab, and the extent to which such potential risks and benefits are unknown.  3. Information on available alternative treatments and the risks and benefits of those alternatives, including clinical trials.  4. Patients treated with bamlanivimab/etesevimab and casirivimab\imdevimab should continue to self-isolate and use infection control measures (e.g., wear mask, isolate, social distance, avoid sharing personal items, clean and disinfect "high touch" surfaces, and frequent handwashing) according to CDC guidelines.   5. The  patient or parent/caregiver has the option to accept or refuse bamlanivimab/etesevimab or casirivimab\imdevimab .  After reviewing this information with the patient, The patient agreed to proceed with receiving the bamlanimivab infusion and will be provided a copy of the Fact sheet prior to receiving the infusion.Kathrine Haddock 02/29/2020 2:24 PM

## 2020-02-29 NOTE — Telephone Encounter (Signed)
Per chart review tab pt seen [facility] on 02/28/20.

## 2020-02-29 NOTE — Telephone Encounter (Signed)
Will review ER note 

## 2020-03-01 ENCOUNTER — Ambulatory Visit (HOSPITAL_COMMUNITY)
Admission: RE | Admit: 2020-03-01 | Discharge: 2020-03-01 | Disposition: A | Discharge status: Home / Self Care | Payer: 59 | Source: Ambulatory Visit | Attending: Pulmonary Disease | Admitting: Pulmonary Disease

## 2020-03-01 DIAGNOSIS — Z23 Encounter for immunization: Secondary | ICD-10-CM | POA: Insufficient documentation

## 2020-03-01 DIAGNOSIS — N186 End stage renal disease: Secondary | ICD-10-CM | POA: Diagnosis not present

## 2020-03-01 DIAGNOSIS — U071 COVID-19: Secondary | ICD-10-CM | POA: Diagnosis not present

## 2020-03-01 MED ORDER — DIPHENHYDRAMINE HCL 50 MG/ML IJ SOLN: 50.0000 mg | Freq: Once | INTRAMUSCULAR | Status: DC | PRN

## 2020-03-01 MED ORDER — EPINEPHRINE 0.3 MG/0.3ML IJ SOAJ: 0.3000 mg | Freq: Once | INTRAMUSCULAR | Status: DC | PRN

## 2020-03-01 MED ORDER — ALBUTEROL SULFATE HFA 108 (90 BASE) MCG/ACT IN AERS: 2.0000 | INHALATION_SPRAY | Freq: Once | RESPIRATORY_TRACT | Status: DC | PRN

## 2020-03-01 MED ORDER — FAMOTIDINE IN NACL 20-0.9 MG/50ML-% IV SOLN: 20.0000 mg | Freq: Once | INTRAVENOUS | Status: DC | PRN

## 2020-03-01 MED ORDER — METHYLPREDNISOLONE SODIUM SUCC 125 MG IJ SOLR: 125.0000 mg | Freq: Once | INTRAMUSCULAR | Status: DC | PRN

## 2020-03-01 MED ORDER — SODIUM CHLORIDE 0.9 % IV SOLN: INTRAVENOUS | Status: DC | PRN

## 2020-03-01 NOTE — Progress Notes (Signed)
  Diagnosis: COVID-19  Physician: Dr. Joya Gaskins  Procedure: [facility] Infusion Clinic Med: bamlanivimab\etesevimab infusion - Provided patient with bamlanimivab\etesevimab fact sheet for patients, parents and caregivers prior to infusion.  Complications: No immediate complications noted.  Discharge: Discharged home   Acquanetta Chain 03/01/2020

## 2020-03-01 NOTE — Discharge Instructions (Signed)

## 2020-04-15 ENCOUNTER — Other Ambulatory Visit: Payer: Self-pay

## 2020-04-15 ENCOUNTER — Encounter: Payer: Self-pay | Admitting: Podiatry

## 2020-04-15 ENCOUNTER — Ambulatory Visit (INDEPENDENT_AMBULATORY_CARE_PROVIDER_SITE_OTHER): Payer: 59 | Admitting: Podiatry

## 2020-04-15 DIAGNOSIS — B070 Plantar wart: Secondary | ICD-10-CM

## 2020-04-15 DIAGNOSIS — L989 Disorder of the skin and subcutaneous tissue, unspecified: Secondary | ICD-10-CM | POA: Diagnosis not present

## 2020-04-15 DIAGNOSIS — M79672 Pain in left foot: Secondary | ICD-10-CM

## 2020-04-15 NOTE — Progress Notes (Signed)
   Subjective: 49 y.o. male presenting to the office today for evaluation of what he believes to be a plantar wart to the plantar aspect of the left fifth MTPJ.  Patient states he has had it off and on for several years.  He has tried shaving it down himself and applying the OTC salicylic acid pads with minimal relief.  He presents for further treatment and evaluation   Past Medical History:  Diagnosis Date  . Fibula fracture    ORIF 1990  . Gout    non crystal proven   . History of chickenpox   . History of kidney disease   . HTN (hypertension)   . Nephropathy    Ig A; dx 2002. micropscopic hematuria and proteinuria  . Tibia fracture    ORIF 1990  . Varicocele    L testes  . Venous incompetence    R leg     Objective:  Physical Exam General: Alert and oriented x3 in no acute distress  Dermatology: Hyperkeratotic lesion(s) present on the plantar aspect of the fifth MTPJ left foot. Pain on palpation with a central nucleated core noted. Skin is warm, dry and supple bilateral lower extremities. Negative for open lesions or macerations.  Vascular: Palpable pedal pulses bilaterally. No edema or erythema noted. Capillary refill within normal limits.  Neurological: Epicritic and protective threshold grossly intact bilaterally.   Musculoskeletal Exam: Pain on palpation at the keratotic lesion(s) noted. Range of motion within normal limits bilateral. Muscle strength 5/5 in all groups bilateral.  Assessment: 1.  Plantar verruca vs. porokeratosis left subfifth MTPJ   Plan of Care:  1. Patient evaluated 2. Excisional debridement of keratoic lesion(s) using a chisel blade was performed without incident.  Salicylic acid applied. 3. Dressed area with light dressing. 4.  Recommend OTC salicylic acid liquid under occlusion with a Band-Aid daily x4 weeks  5.  Patient is to return to the clinic PRN.   *Goes by Randall Hiss.  Works for [employer]  Edrick Kins, DPM [facility]  Dr. Edrick Kins, [address]                                        [place], [place] [address]                Office [phone]  Fax [phone]

## 2020-05-18 DIAGNOSIS — Z7682 Awaiting organ transplant status: Secondary | ICD-10-CM | POA: Diagnosis not present

## 2020-06-10 ENCOUNTER — Ambulatory Visit (INDEPENDENT_AMBULATORY_CARE_PROVIDER_SITE_OTHER): Payer: 59 | Admitting: Physician Assistant

## 2020-06-10 ENCOUNTER — Encounter: Payer: Self-pay | Admitting: Physician Assistant

## 2020-06-10 VITALS — BP 150/80 | HR 93 | Ht 77.0 in | Wt 319.0 lb

## 2020-06-10 DIAGNOSIS — Z1211 Encounter for screening for malignant neoplasm of colon: Secondary | ICD-10-CM | POA: Diagnosis not present

## 2020-06-10 DIAGNOSIS — K5903 Drug induced constipation: Secondary | ICD-10-CM

## 2020-06-10 DIAGNOSIS — Z1212 Encounter for screening for malignant neoplasm of rectum: Secondary | ICD-10-CM

## 2020-06-10 MED ORDER — SUTAB 1479-225-188 MG PO TABS: 1.0000 | ORAL_TABLET | ORAL | 0 refills | Status: DC

## 2020-06-10 NOTE — Progress Notes (Signed)
Attending Physician's Attestation   I have reviewed the chart.   I agree with the Advanced Practitioner's note, impression, and recommendations with any updates as below.    Terral Cooks Mansouraty, MD [facility] Advanced Endoscopy Office # [phone]  

## 2020-06-10 NOTE — Progress Notes (Signed)
Chief Complaint: Screening for colorectal cancer  HPI:    Mr. Clapper is a 49 year old male with a past medical history as listed below, who was referred to me by Jearld Fenton, NP for discussion of a screening colonoscopy.      Per referral notes patient is being set up to have a kidney transplant and they recommended a colonoscopy prior to this procedure.    Today, the patient tells me she is completely okay.  He is on the waiting list for kidney transplant and they told him he needed a colonoscopy prior to being considered.  Does tell me that he battles with constipation but this is chronic for him as he is on a lot of phosphorus binders due to his kidney disease.  Currently he is using MiraLAX and Metamucil mixture every morning.    Denies fever, chills, weight loss, blood in his stool, heartburn, reflux or abdominal pain.  Past Medical History:  Diagnosis Date  . Fibula fracture    ORIF 1990  . Gout    non crystal proven   . History of chickenpox   . History of kidney disease   . HTN (hypertension)   . Nephropathy    Ig A; dx 2002. micropscopic hematuria and proteinuria  . Tibia fracture    ORIF 1990  . Varicocele    L testes  . Venous incompetence    R leg    Past Surgical History:  Procedure Laterality Date  . APPENDECTOMY  1982  . AV FISTULA PLACEMENT Left 06/27/2017   Procedure: LEFT ARM ARTERIOVENOUS (AV) FISTULA CREATION;  Surgeon: Waynetta Sandy, MD;  Location: [facility];  Service: Vascular;  Laterality: Left;  . stab phlebectomy Left 12/17/2016   stab phlebectomy >20 incisions (left leg) by Tinnie Gens MD     Current Outpatient Medications  Medication Sig Dispense Refill  . allopurinol (ZYLOPRIM) 100 MG tablet Take 100 mg by mouth daily as needed (gout).   2  . amLODipine (NORVASC) 10 MG tablet Take 10 mg by mouth daily.     Marland Kitchen BYSTOLIC 10 MG tablet Take 10 mg by mouth daily.     . calcium acetate (PHOSLO) 667 MG tablet Take 1 tablet by mouth 3  (three) times daily with meals.    . furosemide (LASIX) 40 MG tablet Take 40 mg by mouth every other day.     . gabapentin (NEURONTIN) 100 MG capsule Take 100 mg by mouth 2 (two) times daily.    Marland Kitchen HYDROcodone-acetaminophen (NORCO/VICODIN) 5-325 MG tablet Take 1 tablet every 6 hours as needed for severe pain 6 tablet 0  . predniSONE (DELTASONE) 20 MG tablet Take 2 tablets (40 mg total) by mouth daily. 8 tablet 0  . tamsulosin (FLOMAX) 0.4 MG CAPS capsule TAKE 1 CAPSULE BY MOUTH EVERY DAY 30 capsule 0  . testosterone cypionate (DEPOTESTOSTERONE CYPIONATE) 200 MG/ML injection INJECT 1 ML (200 MG TOTAL) INTO THE MUSCLE EVERY 14 (FOURTEEN) DAYS. MUST SCHEDULE ANNUAL EXAM 2 mL 0   No current facility-administered medications for this visit.    Allergies as of 06/10/2020  . (Not on File)    Family History  Problem Relation Age of Onset  . Hypertension Other        GP  . Stroke Other   . Diabetes Other        DM - GP   . Cancer Neg Hx   . Heart disease Neg Hx     Social History  Socioeconomic History  . Marital status: Single    Spouse name: Not on file  . Number of children: Not on file  . Years of education: Not on file  . Highest education level: Not on file  Occupational History  . Not on file  Tobacco Use  . Smoking status: Never Smoker  . Smokeless tobacco: Never Used  Vaping Use  . Vaping Use: Never used  Substance and Sexual Activity  . Alcohol use: No    Alcohol/week: 0.0 standard drinks  . Drug use: No  . Sexual activity: Yes  Other Topics Concern  . Not on file  Social History Narrative   Divorced, primary custody of son; [name]; gets regular exercise.       Pt signed designated party release and gives no access to medical records. Can leave msg on cell [phone].    Social Determinants of Health   Financial Resource Strain:   . Difficulty of Paying Living Expenses: Not on file  Food Insecurity:   . Worried About Charity fundraiser in the  Last Year: Not on file  . Ran Out of Food in the Last Year: Not on file  Transportation Needs:   . Lack of Transportation (Medical): Not on file  . Lack of Transportation (Non-Medical): Not on file  Physical Activity:   . Days of Exercise per Week: Not on file  . Minutes of Exercise per Session: Not on file  Stress:   . Feeling of Stress : Not on file  Social Connections:   . Frequency of Communication with Friends and Family: Not on file  . Frequency of Social Gatherings with Friends and Family: Not on file  . Attends Religious Services: Not on file  . Active Member of Clubs or Organizations: Not on file  . Attends Archivist Meetings: Not on file  . Marital Status: Not on file  Intimate Partner Violence:   . Fear of Current or Ex-Partner: Not on file  . Emotionally Abused: Not on file  . Physically Abused: Not on file  . Sexually Abused: Not on file    Review of Systems:    Constitutional: No weight loss, fever or chills Skin: No rash  Cardiovascular: No chest pain Respiratory: No SOB Gastrointestinal: See HPI and otherwise negative Genitourinary: No dysuria  Neurological: No headache, dizziness or syncope Musculoskeletal: No new muscle or joint pain Hematologic: No bleeding  Psychiatric: No history of depression or anxiety   Physical Exam:  Vital signs: BP (!) 150/80   Pulse 93   Ht 6\' 5"  (1.956 m)   Wt (!) 319 lb (144.7 kg) Comment: with steel toe boots and fully clothed  BMI 37.83 kg/m   Constitutional:   Pleasant Caucasian male appears to be in NAD, Well developed, Well nourished, alert and cooperative Head:  Normocephalic and atraumatic. Eyes:   PEERL, EOMI. No icterus. Conjunctiva pink. Ears:  Normal auditory acuity. Neck:  Supple Throat: Oral cavity and pharynx without inflammation, swelling or lesion.  Respiratory: Respirations even and unlabored. Lungs clear to auscultation bilaterally.   No wheezes, crackles, or rhonchi.  Cardiovascular: Normal  S1, S2. No MRG. Regular rate and rhythm. No peripheral edema, cyanosis or pallor.  Gastrointestinal:  Soft, nondistended, nontender. No rebound or guarding. Normal bowel sounds. No appreciable masses or hepatomegaly. Rectal:  Not performed.  Msk:  Symmetrical without gross deformities. Without edema, no deformity or joint abnormality.  Neurologic:  Alert and  oriented x4;  grossly normal  neurologically.  Skin:   Dry and intact without significant lesions or rashes. Psychiatric: Demonstrates good judgement and reason without abnormal affect or behaviors.  RELEVANT LABS AND IMAGING: CBC    Component Value Date/Time   WBC 11.1 (H) 10/27/2019 1139   RBC 3.13 (L) 10/27/2019 1139   HGB 10.5 (L) 10/27/2019 1139   HCT 31.7 (L) 10/27/2019 1139   PLT 136 (L) 10/27/2019 1139   MCV 101.3 (H) 10/27/2019 1139   MCH 33.5 10/27/2019 1139   MCHC 33.1 10/27/2019 1139   RDW 13.4 10/27/2019 1139   LYMPHSABS 1.3 10/27/2019 1139   MONOABS 1.0 10/27/2019 1139   EOSABS 0.1 10/27/2019 1139   BASOSABS 0.1 10/27/2019 1139    CMP     Component Value Date/Time   NA 131 (L) 10/27/2019 1139   K 5.0 10/27/2019 1139   CL 94 (L) 10/27/2019 1139   CO2 21 (L) 10/27/2019 1139   GLUCOSE 89 10/27/2019 1139   BUN 83 (H) 10/27/2019 1139   CREATININE 19.31 (H) 10/27/2019 1139   CALCIUM 8.8 (L) 10/27/2019 1139   PROT 7.0 10/27/2019 1139   ALBUMIN 3.7 10/27/2019 1139   AST 10 (L) 10/27/2019 1139   ALT 16 10/27/2019 1139   ALKPHOS 45 10/27/2019 1139   BILITOT 0.6 10/27/2019 1139   GFRNONAA 2 (L) 10/27/2019 1139   GFRAA 3 (L) 10/27/2019 1139    Assessment: 1.  Screening for colorectal cancer: Patient is on the list for kidney transplant needs colonoscopy before being considered 2.  Constipation: Medication induced  Plan: 1.  Discussed constipation, recommend the patient increase his MiraLAX dosage to twice daily, explained that he can use up to 4 times a day if necessary. 2.  Patient was scheduled for a  screening colonoscopy in the [facility] with Dr. Rush Landmark.  Did discuss risks, benefits, limitations and alternatives and patient agrees to proceed.  He has had both of his Covid vaccines. 3.  Patient to follow in clinic per recommendations from Dr. Rush Landmark after time of procedure.  Ellouise Newer, PA-C [facility] 06/10/2020, 2:18 PM  Cc: Jearld Fenton, NP

## 2020-06-10 NOTE — Patient Instructions (Signed)
You have been scheduled for a colonoscopy. Please follow written instructions given to you at your visit today.  Please pick up your prep supplies at the pharmacy within the next 1-3 days. If you use inhalers (even only as needed), please bring them with you on the day of your procedure.  Please follow up with Dr Rush Landmark as needed following colonoscopy.  If you are age 50 or older, your body mass index should be between 23-30. Your Body mass index is 37.83 kg/m. If this is out of the aforementioned range listed, please consider follow up with your Primary Care Provider.  If you are age 56 or younger, your body mass index should be between 19-25. Your Body mass index is 37.83 kg/m. If this is out of the aformentioned range listed, please consider follow up with your Primary Care Provider.   Due to recent changes in healthcare laws, you may see the results of your imaging and laboratory studies on MyChart before your provider has had a chance to review them.  We understand that in some cases there may be results that are confusing or concerning to you. Not all laboratory results come back in the same time frame and the provider may be waiting for multiple results in order to interpret others.  Please give [place] 48 hours in order for your provider to thoroughly review all the results before contacting the office for clarification of your results.

## 2020-06-14 ENCOUNTER — Other Ambulatory Visit (HOSPITAL_COMMUNITY): Payer: Self-pay | Admitting: Nephrology

## 2020-06-14 DIAGNOSIS — Z7682 Awaiting organ transplant status: Secondary | ICD-10-CM

## 2020-06-15 DIAGNOSIS — K650 Generalized (acute) peritonitis: Secondary | ICD-10-CM | POA: Diagnosis not present

## 2020-06-15 DIAGNOSIS — N186 End stage renal disease: Secondary | ICD-10-CM | POA: Diagnosis not present

## 2020-06-15 DIAGNOSIS — Z992 Dependence on renal dialysis: Secondary | ICD-10-CM | POA: Diagnosis not present

## 2020-06-15 DIAGNOSIS — D631 Anemia in chronic kidney disease: Secondary | ICD-10-CM | POA: Diagnosis not present

## 2020-06-15 DIAGNOSIS — N2581 Secondary hyperparathyroidism of renal origin: Secondary | ICD-10-CM | POA: Diagnosis not present

## 2020-06-15 DIAGNOSIS — R880 Cloudy (hemodialysis) (peritoneal) dialysis effluent: Secondary | ICD-10-CM | POA: Diagnosis not present

## 2020-06-15 DIAGNOSIS — D509 Iron deficiency anemia, unspecified: Secondary | ICD-10-CM | POA: Diagnosis not present

## 2020-06-15 DIAGNOSIS — N2589 Other disorders resulting from impaired renal tubular function: Secondary | ICD-10-CM | POA: Diagnosis not present

## 2020-06-16 ENCOUNTER — Other Ambulatory Visit: Payer: Self-pay

## 2020-06-16 ENCOUNTER — Ambulatory Visit (HOSPITAL_COMMUNITY)
Admission: RE | Admit: 2020-06-16 | Discharge: 2020-06-16 | Disposition: A | Discharge status: Home / Self Care | Payer: Medicare Other | Source: Ambulatory Visit | Attending: Internal Medicine | Admitting: Internal Medicine

## 2020-06-16 DIAGNOSIS — Z992 Dependence on renal dialysis: Secondary | ICD-10-CM | POA: Diagnosis not present

## 2020-06-16 DIAGNOSIS — Z789 Other specified health status: Secondary | ICD-10-CM | POA: Diagnosis not present

## 2020-06-16 DIAGNOSIS — R880 Cloudy (hemodialysis) (peritoneal) dialysis effluent: Secondary | ICD-10-CM | POA: Diagnosis not present

## 2020-06-16 DIAGNOSIS — K650 Generalized (acute) peritonitis: Secondary | ICD-10-CM | POA: Diagnosis not present

## 2020-06-16 DIAGNOSIS — N186 End stage renal disease: Secondary | ICD-10-CM | POA: Diagnosis not present

## 2020-06-16 DIAGNOSIS — D509 Iron deficiency anemia, unspecified: Secondary | ICD-10-CM | POA: Diagnosis not present

## 2020-06-16 DIAGNOSIS — N2589 Other disorders resulting from impaired renal tubular function: Secondary | ICD-10-CM | POA: Diagnosis not present

## 2020-06-17 DIAGNOSIS — K650 Generalized (acute) peritonitis: Secondary | ICD-10-CM | POA: Diagnosis not present

## 2020-06-17 DIAGNOSIS — R880 Cloudy (hemodialysis) (peritoneal) dialysis effluent: Secondary | ICD-10-CM | POA: Diagnosis not present

## 2020-06-17 DIAGNOSIS — N2589 Other disorders resulting from impaired renal tubular function: Secondary | ICD-10-CM | POA: Diagnosis not present

## 2020-06-17 DIAGNOSIS — D509 Iron deficiency anemia, unspecified: Secondary | ICD-10-CM | POA: Diagnosis not present

## 2020-06-17 DIAGNOSIS — N186 End stage renal disease: Secondary | ICD-10-CM | POA: Diagnosis not present

## 2020-06-17 DIAGNOSIS — Z992 Dependence on renal dialysis: Secondary | ICD-10-CM | POA: Diagnosis not present

## 2020-06-18 DIAGNOSIS — R880 Cloudy (hemodialysis) (peritoneal) dialysis effluent: Secondary | ICD-10-CM | POA: Diagnosis not present

## 2020-06-18 DIAGNOSIS — N186 End stage renal disease: Secondary | ICD-10-CM | POA: Diagnosis not present

## 2020-06-18 DIAGNOSIS — Z992 Dependence on renal dialysis: Secondary | ICD-10-CM | POA: Diagnosis not present

## 2020-06-18 DIAGNOSIS — N2589 Other disorders resulting from impaired renal tubular function: Secondary | ICD-10-CM | POA: Diagnosis not present

## 2020-06-18 DIAGNOSIS — K650 Generalized (acute) peritonitis: Secondary | ICD-10-CM | POA: Diagnosis not present

## 2020-06-18 DIAGNOSIS — D509 Iron deficiency anemia, unspecified: Secondary | ICD-10-CM | POA: Diagnosis not present

## 2020-06-19 DIAGNOSIS — K650 Generalized (acute) peritonitis: Secondary | ICD-10-CM | POA: Diagnosis not present

## 2020-06-19 DIAGNOSIS — R880 Cloudy (hemodialysis) (peritoneal) dialysis effluent: Secondary | ICD-10-CM | POA: Diagnosis not present

## 2020-06-19 DIAGNOSIS — N186 End stage renal disease: Secondary | ICD-10-CM | POA: Diagnosis not present

## 2020-06-19 DIAGNOSIS — D509 Iron deficiency anemia, unspecified: Secondary | ICD-10-CM | POA: Diagnosis not present

## 2020-06-19 DIAGNOSIS — N2589 Other disorders resulting from impaired renal tubular function: Secondary | ICD-10-CM | POA: Diagnosis not present

## 2020-06-19 DIAGNOSIS — Z992 Dependence on renal dialysis: Secondary | ICD-10-CM | POA: Diagnosis not present

## 2020-06-20 DIAGNOSIS — K650 Generalized (acute) peritonitis: Secondary | ICD-10-CM | POA: Diagnosis not present

## 2020-06-20 DIAGNOSIS — R880 Cloudy (hemodialysis) (peritoneal) dialysis effluent: Secondary | ICD-10-CM | POA: Diagnosis not present

## 2020-06-20 DIAGNOSIS — Z992 Dependence on renal dialysis: Secondary | ICD-10-CM | POA: Diagnosis not present

## 2020-06-20 DIAGNOSIS — N2589 Other disorders resulting from impaired renal tubular function: Secondary | ICD-10-CM | POA: Diagnosis not present

## 2020-06-20 DIAGNOSIS — N186 End stage renal disease: Secondary | ICD-10-CM | POA: Diagnosis not present

## 2020-06-20 DIAGNOSIS — D509 Iron deficiency anemia, unspecified: Secondary | ICD-10-CM | POA: Diagnosis not present

## 2020-06-21 DIAGNOSIS — E7849 Other hyperlipidemia: Secondary | ICD-10-CM | POA: Diagnosis not present

## 2020-06-21 DIAGNOSIS — N186 End stage renal disease: Secondary | ICD-10-CM | POA: Diagnosis not present

## 2020-06-21 DIAGNOSIS — K650 Generalized (acute) peritonitis: Secondary | ICD-10-CM | POA: Diagnosis not present

## 2020-06-21 DIAGNOSIS — D509 Iron deficiency anemia, unspecified: Secondary | ICD-10-CM | POA: Diagnosis not present

## 2020-06-21 DIAGNOSIS — Z992 Dependence on renal dialysis: Secondary | ICD-10-CM | POA: Diagnosis not present

## 2020-06-21 DIAGNOSIS — N2589 Other disorders resulting from impaired renal tubular function: Secondary | ICD-10-CM | POA: Diagnosis not present

## 2020-06-21 DIAGNOSIS — M1040 Other secondary gout, unspecified site: Secondary | ICD-10-CM | POA: Diagnosis not present

## 2020-06-21 DIAGNOSIS — R880 Cloudy (hemodialysis) (peritoneal) dialysis effluent: Secondary | ICD-10-CM | POA: Diagnosis not present

## 2020-06-22 DIAGNOSIS — R880 Cloudy (hemodialysis) (peritoneal) dialysis effluent: Secondary | ICD-10-CM | POA: Diagnosis not present

## 2020-06-22 DIAGNOSIS — N2589 Other disorders resulting from impaired renal tubular function: Secondary | ICD-10-CM | POA: Diagnosis not present

## 2020-06-22 DIAGNOSIS — N186 End stage renal disease: Secondary | ICD-10-CM | POA: Diagnosis not present

## 2020-06-22 DIAGNOSIS — Z992 Dependence on renal dialysis: Secondary | ICD-10-CM | POA: Diagnosis not present

## 2020-06-22 DIAGNOSIS — D509 Iron deficiency anemia, unspecified: Secondary | ICD-10-CM | POA: Diagnosis not present

## 2020-06-22 DIAGNOSIS — K650 Generalized (acute) peritonitis: Secondary | ICD-10-CM | POA: Diagnosis not present

## 2020-06-23 DIAGNOSIS — R880 Cloudy (hemodialysis) (peritoneal) dialysis effluent: Secondary | ICD-10-CM | POA: Diagnosis not present

## 2020-06-23 DIAGNOSIS — K650 Generalized (acute) peritonitis: Secondary | ICD-10-CM | POA: Diagnosis not present

## 2020-06-23 DIAGNOSIS — D509 Iron deficiency anemia, unspecified: Secondary | ICD-10-CM | POA: Diagnosis not present

## 2020-06-23 DIAGNOSIS — N2589 Other disorders resulting from impaired renal tubular function: Secondary | ICD-10-CM | POA: Diagnosis not present

## 2020-06-23 DIAGNOSIS — Z992 Dependence on renal dialysis: Secondary | ICD-10-CM | POA: Diagnosis not present

## 2020-06-23 DIAGNOSIS — N186 End stage renal disease: Secondary | ICD-10-CM | POA: Diagnosis not present

## 2020-06-24 DIAGNOSIS — N186 End stage renal disease: Secondary | ICD-10-CM | POA: Diagnosis not present

## 2020-06-24 DIAGNOSIS — K650 Generalized (acute) peritonitis: Secondary | ICD-10-CM | POA: Diagnosis not present

## 2020-06-24 DIAGNOSIS — N2589 Other disorders resulting from impaired renal tubular function: Secondary | ICD-10-CM | POA: Diagnosis not present

## 2020-06-24 DIAGNOSIS — Z992 Dependence on renal dialysis: Secondary | ICD-10-CM | POA: Diagnosis not present

## 2020-06-24 DIAGNOSIS — R880 Cloudy (hemodialysis) (peritoneal) dialysis effluent: Secondary | ICD-10-CM | POA: Diagnosis not present

## 2020-06-24 DIAGNOSIS — D509 Iron deficiency anemia, unspecified: Secondary | ICD-10-CM | POA: Diagnosis not present

## 2020-06-25 DIAGNOSIS — D509 Iron deficiency anemia, unspecified: Secondary | ICD-10-CM | POA: Diagnosis not present

## 2020-06-25 DIAGNOSIS — R880 Cloudy (hemodialysis) (peritoneal) dialysis effluent: Secondary | ICD-10-CM | POA: Diagnosis not present

## 2020-06-25 DIAGNOSIS — N186 End stage renal disease: Secondary | ICD-10-CM | POA: Diagnosis not present

## 2020-06-25 DIAGNOSIS — K650 Generalized (acute) peritonitis: Secondary | ICD-10-CM | POA: Diagnosis not present

## 2020-06-25 DIAGNOSIS — N2589 Other disorders resulting from impaired renal tubular function: Secondary | ICD-10-CM | POA: Diagnosis not present

## 2020-06-25 DIAGNOSIS — Z992 Dependence on renal dialysis: Secondary | ICD-10-CM | POA: Diagnosis not present

## 2020-06-26 DIAGNOSIS — R880 Cloudy (hemodialysis) (peritoneal) dialysis effluent: Secondary | ICD-10-CM | POA: Diagnosis not present

## 2020-06-26 DIAGNOSIS — D509 Iron deficiency anemia, unspecified: Secondary | ICD-10-CM | POA: Diagnosis not present

## 2020-06-26 DIAGNOSIS — Z992 Dependence on renal dialysis: Secondary | ICD-10-CM | POA: Diagnosis not present

## 2020-06-26 DIAGNOSIS — N186 End stage renal disease: Secondary | ICD-10-CM | POA: Diagnosis not present

## 2020-06-26 DIAGNOSIS — K650 Generalized (acute) peritonitis: Secondary | ICD-10-CM | POA: Diagnosis not present

## 2020-06-26 DIAGNOSIS — N2589 Other disorders resulting from impaired renal tubular function: Secondary | ICD-10-CM | POA: Diagnosis not present

## 2020-06-27 DIAGNOSIS — R880 Cloudy (hemodialysis) (peritoneal) dialysis effluent: Secondary | ICD-10-CM | POA: Diagnosis not present

## 2020-06-27 DIAGNOSIS — D509 Iron deficiency anemia, unspecified: Secondary | ICD-10-CM | POA: Diagnosis not present

## 2020-06-27 DIAGNOSIS — N2589 Other disorders resulting from impaired renal tubular function: Secondary | ICD-10-CM | POA: Diagnosis not present

## 2020-06-27 DIAGNOSIS — K650 Generalized (acute) peritonitis: Secondary | ICD-10-CM | POA: Diagnosis not present

## 2020-06-27 DIAGNOSIS — Z992 Dependence on renal dialysis: Secondary | ICD-10-CM | POA: Diagnosis not present

## 2020-06-27 DIAGNOSIS — N186 End stage renal disease: Secondary | ICD-10-CM | POA: Diagnosis not present

## 2020-06-28 DIAGNOSIS — N186 End stage renal disease: Secondary | ICD-10-CM | POA: Diagnosis not present

## 2020-06-28 DIAGNOSIS — K650 Generalized (acute) peritonitis: Secondary | ICD-10-CM | POA: Diagnosis not present

## 2020-06-28 DIAGNOSIS — Z992 Dependence on renal dialysis: Secondary | ICD-10-CM | POA: Diagnosis not present

## 2020-06-28 DIAGNOSIS — R880 Cloudy (hemodialysis) (peritoneal) dialysis effluent: Secondary | ICD-10-CM | POA: Diagnosis not present

## 2020-06-28 DIAGNOSIS — N2589 Other disorders resulting from impaired renal tubular function: Secondary | ICD-10-CM | POA: Diagnosis not present

## 2020-06-28 DIAGNOSIS — D509 Iron deficiency anemia, unspecified: Secondary | ICD-10-CM | POA: Diagnosis not present

## 2020-06-29 DIAGNOSIS — K650 Generalized (acute) peritonitis: Secondary | ICD-10-CM | POA: Diagnosis not present

## 2020-06-29 DIAGNOSIS — N186 End stage renal disease: Secondary | ICD-10-CM | POA: Diagnosis not present

## 2020-06-29 DIAGNOSIS — Z992 Dependence on renal dialysis: Secondary | ICD-10-CM | POA: Diagnosis not present

## 2020-06-29 DIAGNOSIS — N2589 Other disorders resulting from impaired renal tubular function: Secondary | ICD-10-CM | POA: Diagnosis not present

## 2020-06-29 DIAGNOSIS — R880 Cloudy (hemodialysis) (peritoneal) dialysis effluent: Secondary | ICD-10-CM | POA: Diagnosis not present

## 2020-06-29 DIAGNOSIS — D509 Iron deficiency anemia, unspecified: Secondary | ICD-10-CM | POA: Diagnosis not present

## 2020-06-30 ENCOUNTER — Ambulatory Visit (AMBULATORY_SURGERY_CENTER): Payer: Medicare Other | Admitting: Gastroenterology

## 2020-06-30 ENCOUNTER — Encounter: Payer: Self-pay | Admitting: Gastroenterology

## 2020-06-30 ENCOUNTER — Other Ambulatory Visit: Payer: Self-pay

## 2020-06-30 VITALS — BP 147/96 | HR 67 | Temp 97.3°F | Resp 12 | Ht 77.0 in | Wt 319.0 lb

## 2020-06-30 DIAGNOSIS — D123 Benign neoplasm of transverse colon: Secondary | ICD-10-CM | POA: Diagnosis not present

## 2020-06-30 DIAGNOSIS — D127 Benign neoplasm of rectosigmoid junction: Secondary | ICD-10-CM | POA: Diagnosis not present

## 2020-06-30 DIAGNOSIS — N2589 Other disorders resulting from impaired renal tubular function: Secondary | ICD-10-CM | POA: Diagnosis not present

## 2020-06-30 DIAGNOSIS — N186 End stage renal disease: Secondary | ICD-10-CM | POA: Diagnosis not present

## 2020-06-30 DIAGNOSIS — I10 Essential (primary) hypertension: Secondary | ICD-10-CM | POA: Diagnosis not present

## 2020-06-30 DIAGNOSIS — Z992 Dependence on renal dialysis: Secondary | ICD-10-CM | POA: Diagnosis not present

## 2020-06-30 DIAGNOSIS — D128 Benign neoplasm of rectum: Secondary | ICD-10-CM

## 2020-06-30 DIAGNOSIS — R880 Cloudy (hemodialysis) (peritoneal) dialysis effluent: Secondary | ICD-10-CM | POA: Diagnosis not present

## 2020-06-30 DIAGNOSIS — K650 Generalized (acute) peritonitis: Secondary | ICD-10-CM | POA: Diagnosis not present

## 2020-06-30 DIAGNOSIS — Z1211 Encounter for screening for malignant neoplasm of colon: Secondary | ICD-10-CM

## 2020-06-30 DIAGNOSIS — D509 Iron deficiency anemia, unspecified: Secondary | ICD-10-CM | POA: Diagnosis not present

## 2020-06-30 MED ORDER — SODIUM CHLORIDE 0.9 % IV SOLN
500.0000 mL | Freq: Once | INTRAVENOUS | Status: DC
Start: 2020-06-30 — End: 2020-06-30

## 2020-06-30 NOTE — Progress Notes (Signed)
Called to room to assist during endoscopic procedure.  Patient ID and intended procedure confirmed with present staff. Received instructions for my participation in the procedure from the performing physician.  

## 2020-06-30 NOTE — Op Note (Signed)
Eric Douglas Patient Name: Robel Wuertz Procedure Date: 06/30/2020 11:17 AM MRN: [id number] Endoscopist: Justice Britain , MD Age: 49 Referring MD:  Date of Birth: 26-Jul-1971 Gender: Male Account #: [id number] Procedure:                Colonoscopy Indications:              Screening for colorectal malignant neoplasm, This                            is the patient's first colonoscopy Medicines:                Monitored Anesthesia Care Procedure:                Pre-Anesthesia Assessment:                           - Prior to the procedure, a History and Physical                            was performed, and patient medications and                            allergies were reviewed. The patient's tolerance of                            previous anesthesia was also reviewed. The risks                            and benefits of the procedure and the sedation                            options and risks were discussed with the patient.                            All questions were answered, and informed consent                            was obtained. Prior Anticoagulants: The patient has                            taken no previous anticoagulant or antiplatelet                            agents. ASA Grade Assessment: III - A patient with                            severe systemic disease. After reviewing the risks                            and benefits, the patient was deemed in                            satisfactory condition to undergo the procedure.  After obtaining informed consent, the colonoscope                            was passed under direct vision. Throughout the                            procedure, the patient's blood pressure, pulse, and                            oxygen saturations were monitored continuously. The                            Colonoscope was introduced through the anus and                            advanced to the the  cecum, identified by                            appendiceal orifice and ileocecal valve. The                            colonoscopy was performed without difficulty. The                            patient tolerated the procedure. The quality of the                            bowel preparation was adequate. The ileocecal                            valve, appendiceal orifice, and rectum were                            photographed. Scope In: 11:32:05 AM Scope Out: 11:50:48 AM Scope Withdrawal Time: 0 hours 13 minutes 24 seconds  Total Procedure Duration: 0 hours 18 minutes 43 seconds  Findings:                 The digital rectal exam findings include                            hemorrhoids. Pertinent negatives include no                            palpable rectal lesions.                           A large amount of liquid semi-liquid stool was                            found in the entire colon, interfering with                            visualization. Lavage of the area was performed  using copious amounts, resulting in clearance with                            adequate visualization.                           Three sessile polyps were found in the                            recto-sigmoid colon (2) and transverse colon (1).                            The polyps were 2 to 8 mm in size. These polyps                            were removed with a cold snare. Resection and                            retrieval were complete.                           Normal mucosa was found in the entire colon                            otherwise.                           Non-bleeding non-thrombosed external and internal                            hemorrhoids were found during retroflexion, during                            perianal exam and during digital exam. The                            hemorrhoids were Grade II (internal hemorrhoids                            that prolapse  but reduce spontaneously). Complications:            No immediate complications. Estimated Blood Loss:     Estimated blood loss was minimal. Impression:               - Hemorrhoids found on digital rectal exam.                           - Stool in the entire examined colon.                           - Three 2 to 8 mm polyps at the recto-sigmoid colon                            and in the transverse colon, removed with a cold  snare. Resected and retrieved.                           - Normal mucosa in the entire examined colon                            otherwise.                           - Non-bleeding non-thrombosed external and internal                            hemorrhoids. Recommendation:           - The patient will be observed post-procedure,                            until all discharge criteria are met.                           - Discharge patient to home.                           - Patient has a contact number available for                            emergencies. The signs and symptoms of potential                            delayed complications were discussed with the                            patient. Return to normal activities tomorrow.                            Written discharge instructions were provided to the                            patient.                           - High fiber diet.                           - Use FiberCon 1-2 tablets PO daily.                           - Continue present medications.                           - Await pathology results.                           - Repeat colonoscopy for surveillance based on                            pathology results. If all tissue is adenomatous  then 3-5 years vs 7-years if less than 2                            adenomatous vs 10-years if no adenomatous tissue.                           - The findings and recommendations were discussed                             with the patient. Justice Britain, MD 06/30/2020 11:57:23 AM

## 2020-06-30 NOTE — Patient Instructions (Signed)
Handouts Provided:  High Fiber Diet, Hemorrhoids, and Polyps  Use FiberCon 1-2 tablets by mouth daily  YOU HAD AN ENDOSCOPIC PROCEDURE TODAY AT [facility]:   Refer to the procedure report that was given to you for any specific questions about what was found during the examination.  If the procedure report does not answer your questions, please call your gastroenterologist to clarify.  If you requested that your care partner not be given the details of your procedure findings, then the procedure report has been included in a sealed envelope for you to review at your convenience later.  YOU SHOULD EXPECT: Some feelings of bloating in the abdomen. Passage of more gas than usual.  Walking can help get rid of the air that was put into your GI tract during the procedure and reduce the bloating. If you had a lower endoscopy (such as a colonoscopy or flexible sigmoidoscopy) you may notice spotting of blood in your stool or on the toilet paper. If you underwent a bowel prep for your procedure, you may not have a normal bowel movement for a few days.  Please Note:  You might notice some irritation and congestion in your nose or some drainage.  This is from the oxygen used during your procedure.  There is no need for concern and it should clear up in a day or so.  SYMPTOMS TO REPORT IMMEDIATELY:   Following lower endoscopy (colonoscopy or flexible sigmoidoscopy):  Excessive amounts of blood in the stool  Significant tenderness or worsening of abdominal pains  Swelling of the abdomen that is new, acute  Fever of 100F or higher  For urgent or emergent issues, a gastroenterologist can be reached at any hour by calling [phone]. Do not use MyChart messaging for urgent concerns.    DIET:  We do recommend a small meal at first, but then you may proceed to your regular diet.  Drink plenty of fluids but you should avoid alcoholic beverages for 24 hours.  ACTIVITY:  You should plan to  take it easy for the rest of today and you should NOT DRIVE or use heavy machinery until tomorrow (because of the sedation medicines used during the test).    FOLLOW UP: Our staff will call the number listed on your records 48-72 hours following your procedure to check on you and address any questions or concerns that you may have regarding the information given to you following your procedure. If we do not reach you, we will leave a message.  We will attempt to reach you two times.  During this call, we will ask if you have developed any symptoms of COVID 19. If you develop any symptoms (ie: fever, flu-like symptoms, shortness of breath, cough etc.) before then, please call [phone].  If you test positive for Covid 19 in the 2 weeks post procedure, please call and report this information to [place].    If any biopsies were taken you will be contacted by phone or by letter within the next 1-3 weeks.  Please call us at [phone] if you have not heard about the biopsies in 3 weeks.    SIGNATURES/CONFIDENTIALITY: You and/or your care partner have signed paperwork which will be entered into your electronic medical record.  These signatures attest to the fact that that the information above on your After Visit Summary has been reviewed and is understood.  Full responsibility of the confidentiality of this discharge information lies with you and/or your care-partner.

## 2020-06-30 NOTE — Progress Notes (Signed)
Report to PACU, RN, vss, BBS= Clear.  

## 2020-07-01 DIAGNOSIS — D509 Iron deficiency anemia, unspecified: Secondary | ICD-10-CM | POA: Diagnosis not present

## 2020-07-01 DIAGNOSIS — K650 Generalized (acute) peritonitis: Secondary | ICD-10-CM | POA: Diagnosis not present

## 2020-07-01 DIAGNOSIS — N2589 Other disorders resulting from impaired renal tubular function: Secondary | ICD-10-CM | POA: Diagnosis not present

## 2020-07-01 DIAGNOSIS — Z992 Dependence on renal dialysis: Secondary | ICD-10-CM | POA: Diagnosis not present

## 2020-07-01 DIAGNOSIS — N186 End stage renal disease: Secondary | ICD-10-CM | POA: Diagnosis not present

## 2020-07-01 DIAGNOSIS — R880 Cloudy (hemodialysis) (peritoneal) dialysis effluent: Secondary | ICD-10-CM | POA: Diagnosis not present

## 2020-07-02 DIAGNOSIS — N186 End stage renal disease: Secondary | ICD-10-CM | POA: Diagnosis not present

## 2020-07-02 DIAGNOSIS — N2589 Other disorders resulting from impaired renal tubular function: Secondary | ICD-10-CM | POA: Diagnosis not present

## 2020-07-02 DIAGNOSIS — D509 Iron deficiency anemia, unspecified: Secondary | ICD-10-CM | POA: Diagnosis not present

## 2020-07-02 DIAGNOSIS — R880 Cloudy (hemodialysis) (peritoneal) dialysis effluent: Secondary | ICD-10-CM | POA: Diagnosis not present

## 2020-07-02 DIAGNOSIS — K650 Generalized (acute) peritonitis: Secondary | ICD-10-CM | POA: Diagnosis not present

## 2020-07-02 DIAGNOSIS — Z992 Dependence on renal dialysis: Secondary | ICD-10-CM | POA: Diagnosis not present

## 2020-07-03 DIAGNOSIS — Z992 Dependence on renal dialysis: Secondary | ICD-10-CM | POA: Diagnosis not present

## 2020-07-03 DIAGNOSIS — R880 Cloudy (hemodialysis) (peritoneal) dialysis effluent: Secondary | ICD-10-CM | POA: Diagnosis not present

## 2020-07-03 DIAGNOSIS — D509 Iron deficiency anemia, unspecified: Secondary | ICD-10-CM | POA: Diagnosis not present

## 2020-07-03 DIAGNOSIS — N186 End stage renal disease: Secondary | ICD-10-CM | POA: Diagnosis not present

## 2020-07-03 DIAGNOSIS — K650 Generalized (acute) peritonitis: Secondary | ICD-10-CM | POA: Diagnosis not present

## 2020-07-03 DIAGNOSIS — N2589 Other disorders resulting from impaired renal tubular function: Secondary | ICD-10-CM | POA: Diagnosis not present

## 2020-07-04 ENCOUNTER — Telehealth: Payer: Self-pay

## 2020-07-04 DIAGNOSIS — N2589 Other disorders resulting from impaired renal tubular function: Secondary | ICD-10-CM | POA: Diagnosis not present

## 2020-07-04 DIAGNOSIS — Z992 Dependence on renal dialysis: Secondary | ICD-10-CM | POA: Diagnosis not present

## 2020-07-04 DIAGNOSIS — D509 Iron deficiency anemia, unspecified: Secondary | ICD-10-CM | POA: Diagnosis not present

## 2020-07-04 DIAGNOSIS — K650 Generalized (acute) peritonitis: Secondary | ICD-10-CM | POA: Diagnosis not present

## 2020-07-04 DIAGNOSIS — R880 Cloudy (hemodialysis) (peritoneal) dialysis effluent: Secondary | ICD-10-CM | POA: Diagnosis not present

## 2020-07-04 DIAGNOSIS — N186 End stage renal disease: Secondary | ICD-10-CM | POA: Diagnosis not present

## 2020-07-04 NOTE — Telephone Encounter (Signed)
  Follow up Call-  Call back number 06/30/2020  Post procedure Call Back phone  # [phone]  Permission to leave phone message Yes  Some recent data might be hidden     Patient questions:  Do you have a fever, pain , or abdominal swelling? No. Pain Score  0 *  Have you tolerated food without any problems? Yes.    Have you been able to return to your normal activities? Yes.    Do you have any questions about your discharge instructions: Diet   No. Medications  No. Follow up visit  No.  Do you have questions or concerns about your Care? No.  Actions: * If pain score is 4 or above: No action needed, pain <4.  1. Have you developed a fever since your procedure? no  2.   Have you had an respiratory symptoms (SOB or cough) since your procedure? no  3.   Have you tested positive for COVID 19 since your procedure no  4.   Have you had any family members/close contacts diagnosed with the COVID 19 since your procedure?  no   If yes to any of these questions please route to Joylene John, RN and Joella Prince, RN

## 2020-07-05 ENCOUNTER — Encounter: Payer: Self-pay | Admitting: Gastroenterology

## 2020-07-05 DIAGNOSIS — N186 End stage renal disease: Secondary | ICD-10-CM | POA: Diagnosis not present

## 2020-07-05 DIAGNOSIS — D509 Iron deficiency anemia, unspecified: Secondary | ICD-10-CM | POA: Diagnosis not present

## 2020-07-05 DIAGNOSIS — N2589 Other disorders resulting from impaired renal tubular function: Secondary | ICD-10-CM | POA: Diagnosis not present

## 2020-07-05 DIAGNOSIS — R880 Cloudy (hemodialysis) (peritoneal) dialysis effluent: Secondary | ICD-10-CM | POA: Diagnosis not present

## 2020-07-05 DIAGNOSIS — K650 Generalized (acute) peritonitis: Secondary | ICD-10-CM | POA: Diagnosis not present

## 2020-07-05 DIAGNOSIS — Z992 Dependence on renal dialysis: Secondary | ICD-10-CM | POA: Diagnosis not present

## 2020-07-06 DIAGNOSIS — N186 End stage renal disease: Secondary | ICD-10-CM | POA: Diagnosis not present

## 2020-07-06 DIAGNOSIS — K650 Generalized (acute) peritonitis: Secondary | ICD-10-CM | POA: Diagnosis not present

## 2020-07-06 DIAGNOSIS — D509 Iron deficiency anemia, unspecified: Secondary | ICD-10-CM | POA: Diagnosis not present

## 2020-07-06 DIAGNOSIS — R880 Cloudy (hemodialysis) (peritoneal) dialysis effluent: Secondary | ICD-10-CM | POA: Diagnosis not present

## 2020-07-06 DIAGNOSIS — N2589 Other disorders resulting from impaired renal tubular function: Secondary | ICD-10-CM | POA: Diagnosis not present

## 2020-07-06 DIAGNOSIS — Z992 Dependence on renal dialysis: Secondary | ICD-10-CM | POA: Diagnosis not present

## 2020-07-07 DIAGNOSIS — K650 Generalized (acute) peritonitis: Secondary | ICD-10-CM | POA: Diagnosis not present

## 2020-07-07 DIAGNOSIS — N186 End stage renal disease: Secondary | ICD-10-CM | POA: Diagnosis not present

## 2020-07-07 DIAGNOSIS — N2589 Other disorders resulting from impaired renal tubular function: Secondary | ICD-10-CM | POA: Diagnosis not present

## 2020-07-07 DIAGNOSIS — D509 Iron deficiency anemia, unspecified: Secondary | ICD-10-CM | POA: Diagnosis not present

## 2020-07-07 DIAGNOSIS — R880 Cloudy (hemodialysis) (peritoneal) dialysis effluent: Secondary | ICD-10-CM | POA: Diagnosis not present

## 2020-07-07 DIAGNOSIS — Z992 Dependence on renal dialysis: Secondary | ICD-10-CM | POA: Diagnosis not present

## 2020-07-08 DIAGNOSIS — N2589 Other disorders resulting from impaired renal tubular function: Secondary | ICD-10-CM | POA: Diagnosis not present

## 2020-07-08 DIAGNOSIS — K650 Generalized (acute) peritonitis: Secondary | ICD-10-CM | POA: Diagnosis not present

## 2020-07-08 DIAGNOSIS — R880 Cloudy (hemodialysis) (peritoneal) dialysis effluent: Secondary | ICD-10-CM | POA: Diagnosis not present

## 2020-07-08 DIAGNOSIS — D509 Iron deficiency anemia, unspecified: Secondary | ICD-10-CM | POA: Diagnosis not present

## 2020-07-08 DIAGNOSIS — N186 End stage renal disease: Secondary | ICD-10-CM | POA: Diagnosis not present

## 2020-07-08 DIAGNOSIS — Z992 Dependence on renal dialysis: Secondary | ICD-10-CM | POA: Diagnosis not present

## 2020-07-09 DIAGNOSIS — Z992 Dependence on renal dialysis: Secondary | ICD-10-CM | POA: Diagnosis not present

## 2020-07-09 DIAGNOSIS — N186 End stage renal disease: Secondary | ICD-10-CM | POA: Diagnosis not present

## 2020-07-09 DIAGNOSIS — K650 Generalized (acute) peritonitis: Secondary | ICD-10-CM | POA: Diagnosis not present

## 2020-07-09 DIAGNOSIS — R880 Cloudy (hemodialysis) (peritoneal) dialysis effluent: Secondary | ICD-10-CM | POA: Diagnosis not present

## 2020-07-09 DIAGNOSIS — D509 Iron deficiency anemia, unspecified: Secondary | ICD-10-CM | POA: Diagnosis not present

## 2020-07-09 DIAGNOSIS — N2589 Other disorders resulting from impaired renal tubular function: Secondary | ICD-10-CM | POA: Diagnosis not present

## 2020-07-10 DIAGNOSIS — Z992 Dependence on renal dialysis: Secondary | ICD-10-CM | POA: Diagnosis not present

## 2020-07-10 DIAGNOSIS — N186 End stage renal disease: Secondary | ICD-10-CM | POA: Diagnosis not present

## 2020-07-10 DIAGNOSIS — K650 Generalized (acute) peritonitis: Secondary | ICD-10-CM | POA: Diagnosis not present

## 2020-07-10 DIAGNOSIS — R880 Cloudy (hemodialysis) (peritoneal) dialysis effluent: Secondary | ICD-10-CM | POA: Diagnosis not present

## 2020-07-10 DIAGNOSIS — D509 Iron deficiency anemia, unspecified: Secondary | ICD-10-CM | POA: Diagnosis not present

## 2020-07-10 DIAGNOSIS — N2589 Other disorders resulting from impaired renal tubular function: Secondary | ICD-10-CM | POA: Diagnosis not present

## 2020-07-11 DIAGNOSIS — N186 End stage renal disease: Secondary | ICD-10-CM | POA: Diagnosis not present

## 2020-07-11 DIAGNOSIS — N2589 Other disorders resulting from impaired renal tubular function: Secondary | ICD-10-CM | POA: Diagnosis not present

## 2020-07-11 DIAGNOSIS — D509 Iron deficiency anemia, unspecified: Secondary | ICD-10-CM | POA: Diagnosis not present

## 2020-07-11 DIAGNOSIS — Z992 Dependence on renal dialysis: Secondary | ICD-10-CM | POA: Diagnosis not present

## 2020-07-11 DIAGNOSIS — R880 Cloudy (hemodialysis) (peritoneal) dialysis effluent: Secondary | ICD-10-CM | POA: Diagnosis not present

## 2020-07-11 DIAGNOSIS — K650 Generalized (acute) peritonitis: Secondary | ICD-10-CM | POA: Diagnosis not present

## 2020-07-12 DIAGNOSIS — Z992 Dependence on renal dialysis: Secondary | ICD-10-CM | POA: Diagnosis not present

## 2020-07-12 DIAGNOSIS — D509 Iron deficiency anemia, unspecified: Secondary | ICD-10-CM | POA: Diagnosis not present

## 2020-07-12 DIAGNOSIS — R880 Cloudy (hemodialysis) (peritoneal) dialysis effluent: Secondary | ICD-10-CM | POA: Diagnosis not present

## 2020-07-12 DIAGNOSIS — N2589 Other disorders resulting from impaired renal tubular function: Secondary | ICD-10-CM | POA: Diagnosis not present

## 2020-07-12 DIAGNOSIS — N186 End stage renal disease: Secondary | ICD-10-CM | POA: Diagnosis not present

## 2020-07-12 DIAGNOSIS — K650 Generalized (acute) peritonitis: Secondary | ICD-10-CM | POA: Diagnosis not present

## 2020-07-13 DIAGNOSIS — D509 Iron deficiency anemia, unspecified: Secondary | ICD-10-CM | POA: Diagnosis not present

## 2020-07-13 DIAGNOSIS — K650 Generalized (acute) peritonitis: Secondary | ICD-10-CM | POA: Diagnosis not present

## 2020-07-13 DIAGNOSIS — N2589 Other disorders resulting from impaired renal tubular function: Secondary | ICD-10-CM | POA: Diagnosis not present

## 2020-07-13 DIAGNOSIS — N186 End stage renal disease: Secondary | ICD-10-CM | POA: Diagnosis not present

## 2020-07-13 DIAGNOSIS — R880 Cloudy (hemodialysis) (peritoneal) dialysis effluent: Secondary | ICD-10-CM | POA: Diagnosis not present

## 2020-07-13 DIAGNOSIS — Z992 Dependence on renal dialysis: Secondary | ICD-10-CM | POA: Diagnosis not present

## 2020-07-14 DIAGNOSIS — N186 End stage renal disease: Secondary | ICD-10-CM | POA: Diagnosis not present

## 2020-07-14 DIAGNOSIS — K650 Generalized (acute) peritonitis: Secondary | ICD-10-CM | POA: Diagnosis not present

## 2020-07-14 DIAGNOSIS — D509 Iron deficiency anemia, unspecified: Secondary | ICD-10-CM | POA: Diagnosis not present

## 2020-07-14 DIAGNOSIS — Z992 Dependence on renal dialysis: Secondary | ICD-10-CM | POA: Diagnosis not present

## 2020-07-14 DIAGNOSIS — R880 Cloudy (hemodialysis) (peritoneal) dialysis effluent: Secondary | ICD-10-CM | POA: Diagnosis not present

## 2020-07-14 DIAGNOSIS — N2589 Other disorders resulting from impaired renal tubular function: Secondary | ICD-10-CM | POA: Diagnosis not present

## 2020-07-14 DIAGNOSIS — N028 Recurrent and persistent hematuria with other morphologic changes: Secondary | ICD-10-CM | POA: Diagnosis not present

## 2020-07-15 DIAGNOSIS — D509 Iron deficiency anemia, unspecified: Secondary | ICD-10-CM | POA: Diagnosis not present

## 2020-07-15 DIAGNOSIS — D631 Anemia in chronic kidney disease: Secondary | ICD-10-CM | POA: Diagnosis not present

## 2020-07-15 DIAGNOSIS — Z79899 Other long term (current) drug therapy: Secondary | ICD-10-CM | POA: Diagnosis not present

## 2020-07-15 DIAGNOSIS — K650 Generalized (acute) peritonitis: Secondary | ICD-10-CM | POA: Diagnosis not present

## 2020-07-15 DIAGNOSIS — N2581 Secondary hyperparathyroidism of renal origin: Secondary | ICD-10-CM | POA: Diagnosis not present

## 2020-07-15 DIAGNOSIS — E440 Moderate protein-calorie malnutrition: Secondary | ICD-10-CM | POA: Diagnosis not present

## 2020-07-15 DIAGNOSIS — N186 End stage renal disease: Secondary | ICD-10-CM | POA: Diagnosis not present

## 2020-07-15 DIAGNOSIS — Z992 Dependence on renal dialysis: Secondary | ICD-10-CM | POA: Diagnosis not present

## 2020-07-16 DIAGNOSIS — K650 Generalized (acute) peritonitis: Secondary | ICD-10-CM | POA: Diagnosis not present

## 2020-07-16 DIAGNOSIS — D631 Anemia in chronic kidney disease: Secondary | ICD-10-CM | POA: Diagnosis not present

## 2020-07-16 DIAGNOSIS — D509 Iron deficiency anemia, unspecified: Secondary | ICD-10-CM | POA: Diagnosis not present

## 2020-07-16 DIAGNOSIS — Z992 Dependence on renal dialysis: Secondary | ICD-10-CM | POA: Diagnosis not present

## 2020-07-16 DIAGNOSIS — N2581 Secondary hyperparathyroidism of renal origin: Secondary | ICD-10-CM | POA: Diagnosis not present

## 2020-07-16 DIAGNOSIS — N186 End stage renal disease: Secondary | ICD-10-CM | POA: Diagnosis not present

## 2020-07-17 DIAGNOSIS — D509 Iron deficiency anemia, unspecified: Secondary | ICD-10-CM | POA: Diagnosis not present

## 2020-07-17 DIAGNOSIS — N2581 Secondary hyperparathyroidism of renal origin: Secondary | ICD-10-CM | POA: Diagnosis not present

## 2020-07-17 DIAGNOSIS — N186 End stage renal disease: Secondary | ICD-10-CM | POA: Diagnosis not present

## 2020-07-17 DIAGNOSIS — Z992 Dependence on renal dialysis: Secondary | ICD-10-CM | POA: Diagnosis not present

## 2020-07-17 DIAGNOSIS — K650 Generalized (acute) peritonitis: Secondary | ICD-10-CM | POA: Diagnosis not present

## 2020-07-17 DIAGNOSIS — D631 Anemia in chronic kidney disease: Secondary | ICD-10-CM | POA: Diagnosis not present

## 2020-07-18 DIAGNOSIS — K650 Generalized (acute) peritonitis: Secondary | ICD-10-CM | POA: Diagnosis not present

## 2020-07-18 DIAGNOSIS — Z992 Dependence on renal dialysis: Secondary | ICD-10-CM | POA: Diagnosis not present

## 2020-07-18 DIAGNOSIS — N2581 Secondary hyperparathyroidism of renal origin: Secondary | ICD-10-CM | POA: Diagnosis not present

## 2020-07-18 DIAGNOSIS — N186 End stage renal disease: Secondary | ICD-10-CM | POA: Diagnosis not present

## 2020-07-18 DIAGNOSIS — D631 Anemia in chronic kidney disease: Secondary | ICD-10-CM | POA: Diagnosis not present

## 2020-07-18 DIAGNOSIS — D509 Iron deficiency anemia, unspecified: Secondary | ICD-10-CM | POA: Diagnosis not present

## 2020-07-19 DIAGNOSIS — K650 Generalized (acute) peritonitis: Secondary | ICD-10-CM | POA: Diagnosis not present

## 2020-07-19 DIAGNOSIS — D631 Anemia in chronic kidney disease: Secondary | ICD-10-CM | POA: Diagnosis not present

## 2020-07-19 DIAGNOSIS — N2581 Secondary hyperparathyroidism of renal origin: Secondary | ICD-10-CM | POA: Diagnosis not present

## 2020-07-19 DIAGNOSIS — N186 End stage renal disease: Secondary | ICD-10-CM | POA: Diagnosis not present

## 2020-07-19 DIAGNOSIS — D509 Iron deficiency anemia, unspecified: Secondary | ICD-10-CM | POA: Diagnosis not present

## 2020-07-19 DIAGNOSIS — Z992 Dependence on renal dialysis: Secondary | ICD-10-CM | POA: Diagnosis not present

## 2020-07-20 DIAGNOSIS — D631 Anemia in chronic kidney disease: Secondary | ICD-10-CM | POA: Diagnosis not present

## 2020-07-20 DIAGNOSIS — N186 End stage renal disease: Secondary | ICD-10-CM | POA: Diagnosis not present

## 2020-07-20 DIAGNOSIS — N2581 Secondary hyperparathyroidism of renal origin: Secondary | ICD-10-CM | POA: Diagnosis not present

## 2020-07-20 DIAGNOSIS — Z992 Dependence on renal dialysis: Secondary | ICD-10-CM | POA: Diagnosis not present

## 2020-07-20 DIAGNOSIS — K650 Generalized (acute) peritonitis: Secondary | ICD-10-CM | POA: Diagnosis not present

## 2020-07-20 DIAGNOSIS — D509 Iron deficiency anemia, unspecified: Secondary | ICD-10-CM | POA: Diagnosis not present

## 2020-07-21 DIAGNOSIS — N186 End stage renal disease: Secondary | ICD-10-CM | POA: Diagnosis not present

## 2020-07-21 DIAGNOSIS — N2581 Secondary hyperparathyroidism of renal origin: Secondary | ICD-10-CM | POA: Diagnosis not present

## 2020-07-21 DIAGNOSIS — D631 Anemia in chronic kidney disease: Secondary | ICD-10-CM | POA: Diagnosis not present

## 2020-07-21 DIAGNOSIS — D509 Iron deficiency anemia, unspecified: Secondary | ICD-10-CM | POA: Diagnosis not present

## 2020-07-21 DIAGNOSIS — Z992 Dependence on renal dialysis: Secondary | ICD-10-CM | POA: Diagnosis not present

## 2020-07-21 DIAGNOSIS — K650 Generalized (acute) peritonitis: Secondary | ICD-10-CM | POA: Diagnosis not present

## 2020-07-22 DIAGNOSIS — D509 Iron deficiency anemia, unspecified: Secondary | ICD-10-CM | POA: Diagnosis not present

## 2020-07-22 DIAGNOSIS — D631 Anemia in chronic kidney disease: Secondary | ICD-10-CM | POA: Diagnosis not present

## 2020-07-22 DIAGNOSIS — N2581 Secondary hyperparathyroidism of renal origin: Secondary | ICD-10-CM | POA: Diagnosis not present

## 2020-07-22 DIAGNOSIS — N186 End stage renal disease: Secondary | ICD-10-CM | POA: Diagnosis not present

## 2020-07-22 DIAGNOSIS — K650 Generalized (acute) peritonitis: Secondary | ICD-10-CM | POA: Diagnosis not present

## 2020-07-22 DIAGNOSIS — Z992 Dependence on renal dialysis: Secondary | ICD-10-CM | POA: Diagnosis not present

## 2020-07-23 DIAGNOSIS — N2581 Secondary hyperparathyroidism of renal origin: Secondary | ICD-10-CM | POA: Diagnosis not present

## 2020-07-23 DIAGNOSIS — Z992 Dependence on renal dialysis: Secondary | ICD-10-CM | POA: Diagnosis not present

## 2020-07-23 DIAGNOSIS — D509 Iron deficiency anemia, unspecified: Secondary | ICD-10-CM | POA: Diagnosis not present

## 2020-07-23 DIAGNOSIS — N186 End stage renal disease: Secondary | ICD-10-CM | POA: Diagnosis not present

## 2020-07-23 DIAGNOSIS — K650 Generalized (acute) peritonitis: Secondary | ICD-10-CM | POA: Diagnosis not present

## 2020-07-23 DIAGNOSIS — D631 Anemia in chronic kidney disease: Secondary | ICD-10-CM | POA: Diagnosis not present

## 2020-07-24 DIAGNOSIS — D631 Anemia in chronic kidney disease: Secondary | ICD-10-CM | POA: Diagnosis not present

## 2020-07-24 DIAGNOSIS — K650 Generalized (acute) peritonitis: Secondary | ICD-10-CM | POA: Diagnosis not present

## 2020-07-24 DIAGNOSIS — N186 End stage renal disease: Secondary | ICD-10-CM | POA: Diagnosis not present

## 2020-07-24 DIAGNOSIS — N2581 Secondary hyperparathyroidism of renal origin: Secondary | ICD-10-CM | POA: Diagnosis not present

## 2020-07-24 DIAGNOSIS — D509 Iron deficiency anemia, unspecified: Secondary | ICD-10-CM | POA: Diagnosis not present

## 2020-07-24 DIAGNOSIS — Z992 Dependence on renal dialysis: Secondary | ICD-10-CM | POA: Diagnosis not present

## 2020-07-25 DIAGNOSIS — D631 Anemia in chronic kidney disease: Secondary | ICD-10-CM | POA: Diagnosis not present

## 2020-07-25 DIAGNOSIS — M1040 Other secondary gout, unspecified site: Secondary | ICD-10-CM | POA: Diagnosis not present

## 2020-07-25 DIAGNOSIS — D509 Iron deficiency anemia, unspecified: Secondary | ICD-10-CM | POA: Diagnosis not present

## 2020-07-25 DIAGNOSIS — N2581 Secondary hyperparathyroidism of renal origin: Secondary | ICD-10-CM | POA: Diagnosis not present

## 2020-07-25 DIAGNOSIS — N186 End stage renal disease: Secondary | ICD-10-CM | POA: Diagnosis not present

## 2020-07-25 DIAGNOSIS — Z992 Dependence on renal dialysis: Secondary | ICD-10-CM | POA: Diagnosis not present

## 2020-07-25 DIAGNOSIS — K650 Generalized (acute) peritonitis: Secondary | ICD-10-CM | POA: Diagnosis not present

## 2020-07-26 DIAGNOSIS — D631 Anemia in chronic kidney disease: Secondary | ICD-10-CM | POA: Diagnosis not present

## 2020-07-26 DIAGNOSIS — K650 Generalized (acute) peritonitis: Secondary | ICD-10-CM | POA: Diagnosis not present

## 2020-07-26 DIAGNOSIS — D509 Iron deficiency anemia, unspecified: Secondary | ICD-10-CM | POA: Diagnosis not present

## 2020-07-26 DIAGNOSIS — Z992 Dependence on renal dialysis: Secondary | ICD-10-CM | POA: Diagnosis not present

## 2020-07-26 DIAGNOSIS — N2581 Secondary hyperparathyroidism of renal origin: Secondary | ICD-10-CM | POA: Diagnosis not present

## 2020-07-26 DIAGNOSIS — N186 End stage renal disease: Secondary | ICD-10-CM | POA: Diagnosis not present

## 2020-07-27 DIAGNOSIS — D509 Iron deficiency anemia, unspecified: Secondary | ICD-10-CM | POA: Diagnosis not present

## 2020-07-27 DIAGNOSIS — Z992 Dependence on renal dialysis: Secondary | ICD-10-CM | POA: Diagnosis not present

## 2020-07-27 DIAGNOSIS — K650 Generalized (acute) peritonitis: Secondary | ICD-10-CM | POA: Diagnosis not present

## 2020-07-27 DIAGNOSIS — N186 End stage renal disease: Secondary | ICD-10-CM | POA: Diagnosis not present

## 2020-07-27 DIAGNOSIS — D631 Anemia in chronic kidney disease: Secondary | ICD-10-CM | POA: Diagnosis not present

## 2020-07-27 DIAGNOSIS — N2581 Secondary hyperparathyroidism of renal origin: Secondary | ICD-10-CM | POA: Diagnosis not present

## 2020-07-28 DIAGNOSIS — K650 Generalized (acute) peritonitis: Secondary | ICD-10-CM | POA: Diagnosis not present

## 2020-07-28 DIAGNOSIS — Z992 Dependence on renal dialysis: Secondary | ICD-10-CM | POA: Diagnosis not present

## 2020-07-28 DIAGNOSIS — N186 End stage renal disease: Secondary | ICD-10-CM | POA: Diagnosis not present

## 2020-07-28 DIAGNOSIS — N2581 Secondary hyperparathyroidism of renal origin: Secondary | ICD-10-CM | POA: Diagnosis not present

## 2020-07-28 DIAGNOSIS — D631 Anemia in chronic kidney disease: Secondary | ICD-10-CM | POA: Diagnosis not present

## 2020-07-28 DIAGNOSIS — D509 Iron deficiency anemia, unspecified: Secondary | ICD-10-CM | POA: Diagnosis not present

## 2020-07-29 DIAGNOSIS — Z992 Dependence on renal dialysis: Secondary | ICD-10-CM | POA: Diagnosis not present

## 2020-07-29 DIAGNOSIS — D509 Iron deficiency anemia, unspecified: Secondary | ICD-10-CM | POA: Diagnosis not present

## 2020-07-29 DIAGNOSIS — N2581 Secondary hyperparathyroidism of renal origin: Secondary | ICD-10-CM | POA: Diagnosis not present

## 2020-07-29 DIAGNOSIS — N186 End stage renal disease: Secondary | ICD-10-CM | POA: Diagnosis not present

## 2020-07-29 DIAGNOSIS — K650 Generalized (acute) peritonitis: Secondary | ICD-10-CM | POA: Diagnosis not present

## 2020-07-29 DIAGNOSIS — D631 Anemia in chronic kidney disease: Secondary | ICD-10-CM | POA: Diagnosis not present

## 2020-07-30 DIAGNOSIS — Z992 Dependence on renal dialysis: Secondary | ICD-10-CM | POA: Diagnosis not present

## 2020-07-30 DIAGNOSIS — D631 Anemia in chronic kidney disease: Secondary | ICD-10-CM | POA: Diagnosis not present

## 2020-07-30 DIAGNOSIS — N2581 Secondary hyperparathyroidism of renal origin: Secondary | ICD-10-CM | POA: Diagnosis not present

## 2020-07-30 DIAGNOSIS — D509 Iron deficiency anemia, unspecified: Secondary | ICD-10-CM | POA: Diagnosis not present

## 2020-07-30 DIAGNOSIS — K650 Generalized (acute) peritonitis: Secondary | ICD-10-CM | POA: Diagnosis not present

## 2020-07-30 DIAGNOSIS — N186 End stage renal disease: Secondary | ICD-10-CM | POA: Diagnosis not present

## 2020-07-31 DIAGNOSIS — D631 Anemia in chronic kidney disease: Secondary | ICD-10-CM | POA: Diagnosis not present

## 2020-07-31 DIAGNOSIS — D509 Iron deficiency anemia, unspecified: Secondary | ICD-10-CM | POA: Diagnosis not present

## 2020-07-31 DIAGNOSIS — N186 End stage renal disease: Secondary | ICD-10-CM | POA: Diagnosis not present

## 2020-07-31 DIAGNOSIS — N2581 Secondary hyperparathyroidism of renal origin: Secondary | ICD-10-CM | POA: Diagnosis not present

## 2020-07-31 DIAGNOSIS — K650 Generalized (acute) peritonitis: Secondary | ICD-10-CM | POA: Diagnosis not present

## 2020-07-31 DIAGNOSIS — Z992 Dependence on renal dialysis: Secondary | ICD-10-CM | POA: Diagnosis not present

## 2020-08-01 DIAGNOSIS — Z992 Dependence on renal dialysis: Secondary | ICD-10-CM | POA: Diagnosis not present

## 2020-08-01 DIAGNOSIS — D631 Anemia in chronic kidney disease: Secondary | ICD-10-CM | POA: Diagnosis not present

## 2020-08-01 DIAGNOSIS — D509 Iron deficiency anemia, unspecified: Secondary | ICD-10-CM | POA: Diagnosis not present

## 2020-08-01 DIAGNOSIS — N186 End stage renal disease: Secondary | ICD-10-CM | POA: Diagnosis not present

## 2020-08-01 DIAGNOSIS — N2581 Secondary hyperparathyroidism of renal origin: Secondary | ICD-10-CM | POA: Diagnosis not present

## 2020-08-01 DIAGNOSIS — K650 Generalized (acute) peritonitis: Secondary | ICD-10-CM | POA: Diagnosis not present

## 2020-08-02 DIAGNOSIS — Z992 Dependence on renal dialysis: Secondary | ICD-10-CM | POA: Diagnosis not present

## 2020-08-02 DIAGNOSIS — N2581 Secondary hyperparathyroidism of renal origin: Secondary | ICD-10-CM | POA: Diagnosis not present

## 2020-08-02 DIAGNOSIS — D509 Iron deficiency anemia, unspecified: Secondary | ICD-10-CM | POA: Diagnosis not present

## 2020-08-02 DIAGNOSIS — N186 End stage renal disease: Secondary | ICD-10-CM | POA: Diagnosis not present

## 2020-08-02 DIAGNOSIS — K650 Generalized (acute) peritonitis: Secondary | ICD-10-CM | POA: Diagnosis not present

## 2020-08-02 DIAGNOSIS — D631 Anemia in chronic kidney disease: Secondary | ICD-10-CM | POA: Diagnosis not present

## 2020-08-03 DIAGNOSIS — N186 End stage renal disease: Secondary | ICD-10-CM | POA: Diagnosis not present

## 2020-08-03 DIAGNOSIS — Z992 Dependence on renal dialysis: Secondary | ICD-10-CM | POA: Diagnosis not present

## 2020-08-03 DIAGNOSIS — D631 Anemia in chronic kidney disease: Secondary | ICD-10-CM | POA: Diagnosis not present

## 2020-08-03 DIAGNOSIS — N2581 Secondary hyperparathyroidism of renal origin: Secondary | ICD-10-CM | POA: Diagnosis not present

## 2020-08-03 DIAGNOSIS — K650 Generalized (acute) peritonitis: Secondary | ICD-10-CM | POA: Diagnosis not present

## 2020-08-03 DIAGNOSIS — D509 Iron deficiency anemia, unspecified: Secondary | ICD-10-CM | POA: Diagnosis not present

## 2020-08-04 DIAGNOSIS — Z992 Dependence on renal dialysis: Secondary | ICD-10-CM | POA: Diagnosis not present

## 2020-08-04 DIAGNOSIS — K650 Generalized (acute) peritonitis: Secondary | ICD-10-CM | POA: Diagnosis not present

## 2020-08-04 DIAGNOSIS — N2581 Secondary hyperparathyroidism of renal origin: Secondary | ICD-10-CM | POA: Diagnosis not present

## 2020-08-04 DIAGNOSIS — D631 Anemia in chronic kidney disease: Secondary | ICD-10-CM | POA: Diagnosis not present

## 2020-08-04 DIAGNOSIS — N186 End stage renal disease: Secondary | ICD-10-CM | POA: Diagnosis not present

## 2020-08-04 DIAGNOSIS — D509 Iron deficiency anemia, unspecified: Secondary | ICD-10-CM | POA: Diagnosis not present

## 2020-08-05 DIAGNOSIS — Z992 Dependence on renal dialysis: Secondary | ICD-10-CM | POA: Diagnosis not present

## 2020-08-05 DIAGNOSIS — N186 End stage renal disease: Secondary | ICD-10-CM | POA: Diagnosis not present

## 2020-08-05 DIAGNOSIS — D509 Iron deficiency anemia, unspecified: Secondary | ICD-10-CM | POA: Diagnosis not present

## 2020-08-05 DIAGNOSIS — N2581 Secondary hyperparathyroidism of renal origin: Secondary | ICD-10-CM | POA: Diagnosis not present

## 2020-08-05 DIAGNOSIS — K650 Generalized (acute) peritonitis: Secondary | ICD-10-CM | POA: Diagnosis not present

## 2020-08-05 DIAGNOSIS — D631 Anemia in chronic kidney disease: Secondary | ICD-10-CM | POA: Diagnosis not present

## 2020-08-06 DIAGNOSIS — D509 Iron deficiency anemia, unspecified: Secondary | ICD-10-CM | POA: Diagnosis not present

## 2020-08-06 DIAGNOSIS — Z992 Dependence on renal dialysis: Secondary | ICD-10-CM | POA: Diagnosis not present

## 2020-08-06 DIAGNOSIS — K650 Generalized (acute) peritonitis: Secondary | ICD-10-CM | POA: Diagnosis not present

## 2020-08-06 DIAGNOSIS — N2581 Secondary hyperparathyroidism of renal origin: Secondary | ICD-10-CM | POA: Diagnosis not present

## 2020-08-06 DIAGNOSIS — D631 Anemia in chronic kidney disease: Secondary | ICD-10-CM | POA: Diagnosis not present

## 2020-08-06 DIAGNOSIS — N186 End stage renal disease: Secondary | ICD-10-CM | POA: Diagnosis not present

## 2020-08-07 DIAGNOSIS — D509 Iron deficiency anemia, unspecified: Secondary | ICD-10-CM | POA: Diagnosis not present

## 2020-08-07 DIAGNOSIS — Z992 Dependence on renal dialysis: Secondary | ICD-10-CM | POA: Diagnosis not present

## 2020-08-07 DIAGNOSIS — D631 Anemia in chronic kidney disease: Secondary | ICD-10-CM | POA: Diagnosis not present

## 2020-08-07 DIAGNOSIS — N186 End stage renal disease: Secondary | ICD-10-CM | POA: Diagnosis not present

## 2020-08-07 DIAGNOSIS — N2581 Secondary hyperparathyroidism of renal origin: Secondary | ICD-10-CM | POA: Diagnosis not present

## 2020-08-07 DIAGNOSIS — K650 Generalized (acute) peritonitis: Secondary | ICD-10-CM | POA: Diagnosis not present

## 2020-08-08 DIAGNOSIS — N186 End stage renal disease: Secondary | ICD-10-CM | POA: Diagnosis not present

## 2020-08-08 DIAGNOSIS — D509 Iron deficiency anemia, unspecified: Secondary | ICD-10-CM | POA: Diagnosis not present

## 2020-08-08 DIAGNOSIS — N2581 Secondary hyperparathyroidism of renal origin: Secondary | ICD-10-CM | POA: Diagnosis not present

## 2020-08-08 DIAGNOSIS — K650 Generalized (acute) peritonitis: Secondary | ICD-10-CM | POA: Diagnosis not present

## 2020-08-08 DIAGNOSIS — Z992 Dependence on renal dialysis: Secondary | ICD-10-CM | POA: Diagnosis not present

## 2020-08-08 DIAGNOSIS — D631 Anemia in chronic kidney disease: Secondary | ICD-10-CM | POA: Diagnosis not present

## 2020-08-09 DIAGNOSIS — Z992 Dependence on renal dialysis: Secondary | ICD-10-CM | POA: Diagnosis not present

## 2020-08-09 DIAGNOSIS — N186 End stage renal disease: Secondary | ICD-10-CM | POA: Diagnosis not present

## 2020-08-09 DIAGNOSIS — N2581 Secondary hyperparathyroidism of renal origin: Secondary | ICD-10-CM | POA: Diagnosis not present

## 2020-08-09 DIAGNOSIS — D509 Iron deficiency anemia, unspecified: Secondary | ICD-10-CM | POA: Diagnosis not present

## 2020-08-09 DIAGNOSIS — D631 Anemia in chronic kidney disease: Secondary | ICD-10-CM | POA: Diagnosis not present

## 2020-08-09 DIAGNOSIS — K650 Generalized (acute) peritonitis: Secondary | ICD-10-CM | POA: Diagnosis not present

## 2020-08-10 DIAGNOSIS — N186 End stage renal disease: Secondary | ICD-10-CM | POA: Diagnosis not present

## 2020-08-10 DIAGNOSIS — K650 Generalized (acute) peritonitis: Secondary | ICD-10-CM | POA: Diagnosis not present

## 2020-08-10 DIAGNOSIS — Z992 Dependence on renal dialysis: Secondary | ICD-10-CM | POA: Diagnosis not present

## 2020-08-10 DIAGNOSIS — D509 Iron deficiency anemia, unspecified: Secondary | ICD-10-CM | POA: Diagnosis not present

## 2020-08-10 DIAGNOSIS — N2581 Secondary hyperparathyroidism of renal origin: Secondary | ICD-10-CM | POA: Diagnosis not present

## 2020-08-10 DIAGNOSIS — D631 Anemia in chronic kidney disease: Secondary | ICD-10-CM | POA: Diagnosis not present

## 2020-08-11 DIAGNOSIS — D631 Anemia in chronic kidney disease: Secondary | ICD-10-CM | POA: Diagnosis not present

## 2020-08-11 DIAGNOSIS — D509 Iron deficiency anemia, unspecified: Secondary | ICD-10-CM | POA: Diagnosis not present

## 2020-08-11 DIAGNOSIS — N186 End stage renal disease: Secondary | ICD-10-CM | POA: Diagnosis not present

## 2020-08-11 DIAGNOSIS — N2581 Secondary hyperparathyroidism of renal origin: Secondary | ICD-10-CM | POA: Diagnosis not present

## 2020-08-11 DIAGNOSIS — K650 Generalized (acute) peritonitis: Secondary | ICD-10-CM | POA: Diagnosis not present

## 2020-08-11 DIAGNOSIS — Z992 Dependence on renal dialysis: Secondary | ICD-10-CM | POA: Diagnosis not present

## 2020-08-12 DIAGNOSIS — D509 Iron deficiency anemia, unspecified: Secondary | ICD-10-CM | POA: Diagnosis not present

## 2020-08-12 DIAGNOSIS — N2581 Secondary hyperparathyroidism of renal origin: Secondary | ICD-10-CM | POA: Diagnosis not present

## 2020-08-12 DIAGNOSIS — K650 Generalized (acute) peritonitis: Secondary | ICD-10-CM | POA: Diagnosis not present

## 2020-08-12 DIAGNOSIS — Z992 Dependence on renal dialysis: Secondary | ICD-10-CM | POA: Diagnosis not present

## 2020-08-12 DIAGNOSIS — D631 Anemia in chronic kidney disease: Secondary | ICD-10-CM | POA: Diagnosis not present

## 2020-08-12 DIAGNOSIS — N186 End stage renal disease: Secondary | ICD-10-CM | POA: Diagnosis not present

## 2020-08-13 DIAGNOSIS — Z992 Dependence on renal dialysis: Secondary | ICD-10-CM | POA: Diagnosis not present

## 2020-08-13 DIAGNOSIS — N2581 Secondary hyperparathyroidism of renal origin: Secondary | ICD-10-CM | POA: Diagnosis not present

## 2020-08-13 DIAGNOSIS — N186 End stage renal disease: Secondary | ICD-10-CM | POA: Diagnosis not present

## 2020-08-13 DIAGNOSIS — D631 Anemia in chronic kidney disease: Secondary | ICD-10-CM | POA: Diagnosis not present

## 2020-08-13 DIAGNOSIS — K650 Generalized (acute) peritonitis: Secondary | ICD-10-CM | POA: Diagnosis not present

## 2020-08-13 DIAGNOSIS — D509 Iron deficiency anemia, unspecified: Secondary | ICD-10-CM | POA: Diagnosis not present

## 2020-08-14 DIAGNOSIS — N2581 Secondary hyperparathyroidism of renal origin: Secondary | ICD-10-CM | POA: Diagnosis not present

## 2020-08-14 DIAGNOSIS — N186 End stage renal disease: Secondary | ICD-10-CM | POA: Diagnosis not present

## 2020-08-14 DIAGNOSIS — D509 Iron deficiency anemia, unspecified: Secondary | ICD-10-CM | POA: Diagnosis not present

## 2020-08-14 DIAGNOSIS — D631 Anemia in chronic kidney disease: Secondary | ICD-10-CM | POA: Diagnosis not present

## 2020-08-14 DIAGNOSIS — Z992 Dependence on renal dialysis: Secondary | ICD-10-CM | POA: Diagnosis not present

## 2020-08-14 DIAGNOSIS — K650 Generalized (acute) peritonitis: Secondary | ICD-10-CM | POA: Diagnosis not present

## 2020-08-14 DIAGNOSIS — N028 Recurrent and persistent hematuria with other morphologic changes: Secondary | ICD-10-CM | POA: Diagnosis not present

## 2020-08-15 DIAGNOSIS — N186 End stage renal disease: Secondary | ICD-10-CM | POA: Diagnosis not present

## 2020-08-15 DIAGNOSIS — N028 Recurrent and persistent hematuria with other morphologic changes: Secondary | ICD-10-CM | POA: Diagnosis not present

## 2020-08-15 DIAGNOSIS — Z992 Dependence on renal dialysis: Secondary | ICD-10-CM | POA: Diagnosis not present

## 2020-08-15 DIAGNOSIS — K650 Generalized (acute) peritonitis: Secondary | ICD-10-CM | POA: Diagnosis not present

## 2020-08-15 DIAGNOSIS — N2581 Secondary hyperparathyroidism of renal origin: Secondary | ICD-10-CM | POA: Diagnosis not present

## 2020-08-16 DIAGNOSIS — K650 Generalized (acute) peritonitis: Secondary | ICD-10-CM | POA: Diagnosis not present

## 2020-08-16 DIAGNOSIS — N186 End stage renal disease: Secondary | ICD-10-CM | POA: Diagnosis not present

## 2020-08-16 DIAGNOSIS — N2581 Secondary hyperparathyroidism of renal origin: Secondary | ICD-10-CM | POA: Diagnosis not present

## 2020-08-16 DIAGNOSIS — Z992 Dependence on renal dialysis: Secondary | ICD-10-CM | POA: Diagnosis not present

## 2020-08-17 DIAGNOSIS — N186 End stage renal disease: Secondary | ICD-10-CM | POA: Diagnosis not present

## 2020-08-17 DIAGNOSIS — Z992 Dependence on renal dialysis: Secondary | ICD-10-CM | POA: Diagnosis not present

## 2020-08-17 DIAGNOSIS — K650 Generalized (acute) peritonitis: Secondary | ICD-10-CM | POA: Diagnosis not present

## 2020-08-17 DIAGNOSIS — N2581 Secondary hyperparathyroidism of renal origin: Secondary | ICD-10-CM | POA: Diagnosis not present

## 2020-08-18 DIAGNOSIS — N186 End stage renal disease: Secondary | ICD-10-CM | POA: Diagnosis not present

## 2020-08-18 DIAGNOSIS — N2581 Secondary hyperparathyroidism of renal origin: Secondary | ICD-10-CM | POA: Diagnosis not present

## 2020-08-18 DIAGNOSIS — K650 Generalized (acute) peritonitis: Secondary | ICD-10-CM | POA: Diagnosis not present

## 2020-08-18 DIAGNOSIS — Z992 Dependence on renal dialysis: Secondary | ICD-10-CM | POA: Diagnosis not present

## 2020-08-19 DIAGNOSIS — N2581 Secondary hyperparathyroidism of renal origin: Secondary | ICD-10-CM | POA: Diagnosis not present

## 2020-08-19 DIAGNOSIS — K650 Generalized (acute) peritonitis: Secondary | ICD-10-CM | POA: Diagnosis not present

## 2020-08-19 DIAGNOSIS — N186 End stage renal disease: Secondary | ICD-10-CM | POA: Diagnosis not present

## 2020-08-19 DIAGNOSIS — Z992 Dependence on renal dialysis: Secondary | ICD-10-CM | POA: Diagnosis not present

## 2020-08-20 DIAGNOSIS — K650 Generalized (acute) peritonitis: Secondary | ICD-10-CM | POA: Diagnosis not present

## 2020-08-20 DIAGNOSIS — N2581 Secondary hyperparathyroidism of renal origin: Secondary | ICD-10-CM | POA: Diagnosis not present

## 2020-08-20 DIAGNOSIS — Z992 Dependence on renal dialysis: Secondary | ICD-10-CM | POA: Diagnosis not present

## 2020-08-20 DIAGNOSIS — N186 End stage renal disease: Secondary | ICD-10-CM | POA: Diagnosis not present

## 2020-08-21 DIAGNOSIS — N2581 Secondary hyperparathyroidism of renal origin: Secondary | ICD-10-CM | POA: Diagnosis not present

## 2020-08-21 DIAGNOSIS — Z992 Dependence on renal dialysis: Secondary | ICD-10-CM | POA: Diagnosis not present

## 2020-08-21 DIAGNOSIS — K650 Generalized (acute) peritonitis: Secondary | ICD-10-CM | POA: Diagnosis not present

## 2020-08-21 DIAGNOSIS — N186 End stage renal disease: Secondary | ICD-10-CM | POA: Diagnosis not present

## 2020-08-22 DIAGNOSIS — N2581 Secondary hyperparathyroidism of renal origin: Secondary | ICD-10-CM | POA: Diagnosis not present

## 2020-08-22 DIAGNOSIS — N186 End stage renal disease: Secondary | ICD-10-CM | POA: Diagnosis not present

## 2020-08-22 DIAGNOSIS — M1040 Other secondary gout, unspecified site: Secondary | ICD-10-CM | POA: Diagnosis not present

## 2020-08-22 DIAGNOSIS — K650 Generalized (acute) peritonitis: Secondary | ICD-10-CM | POA: Diagnosis not present

## 2020-08-22 DIAGNOSIS — Z992 Dependence on renal dialysis: Secondary | ICD-10-CM | POA: Diagnosis not present

## 2020-08-23 DIAGNOSIS — Z992 Dependence on renal dialysis: Secondary | ICD-10-CM | POA: Diagnosis not present

## 2020-08-23 DIAGNOSIS — N186 End stage renal disease: Secondary | ICD-10-CM | POA: Diagnosis not present

## 2020-08-23 DIAGNOSIS — K650 Generalized (acute) peritonitis: Secondary | ICD-10-CM | POA: Diagnosis not present

## 2020-08-23 DIAGNOSIS — N2581 Secondary hyperparathyroidism of renal origin: Secondary | ICD-10-CM | POA: Diagnosis not present

## 2020-08-24 DIAGNOSIS — N2581 Secondary hyperparathyroidism of renal origin: Secondary | ICD-10-CM | POA: Diagnosis not present

## 2020-08-24 DIAGNOSIS — Z992 Dependence on renal dialysis: Secondary | ICD-10-CM | POA: Diagnosis not present

## 2020-08-24 DIAGNOSIS — N186 End stage renal disease: Secondary | ICD-10-CM | POA: Diagnosis not present

## 2020-08-24 DIAGNOSIS — K650 Generalized (acute) peritonitis: Secondary | ICD-10-CM | POA: Diagnosis not present

## 2020-08-25 DIAGNOSIS — Z992 Dependence on renal dialysis: Secondary | ICD-10-CM | POA: Diagnosis not present

## 2020-08-25 DIAGNOSIS — N186 End stage renal disease: Secondary | ICD-10-CM | POA: Diagnosis not present

## 2020-08-25 DIAGNOSIS — K650 Generalized (acute) peritonitis: Secondary | ICD-10-CM | POA: Diagnosis not present

## 2020-08-25 DIAGNOSIS — N2581 Secondary hyperparathyroidism of renal origin: Secondary | ICD-10-CM | POA: Diagnosis not present

## 2020-08-26 DIAGNOSIS — N2581 Secondary hyperparathyroidism of renal origin: Secondary | ICD-10-CM | POA: Diagnosis not present

## 2020-08-26 DIAGNOSIS — Z992 Dependence on renal dialysis: Secondary | ICD-10-CM | POA: Diagnosis not present

## 2020-08-26 DIAGNOSIS — K650 Generalized (acute) peritonitis: Secondary | ICD-10-CM | POA: Diagnosis not present

## 2020-08-26 DIAGNOSIS — N186 End stage renal disease: Secondary | ICD-10-CM | POA: Diagnosis not present

## 2020-08-27 DIAGNOSIS — K650 Generalized (acute) peritonitis: Secondary | ICD-10-CM | POA: Diagnosis not present

## 2020-08-27 DIAGNOSIS — N186 End stage renal disease: Secondary | ICD-10-CM | POA: Diagnosis not present

## 2020-08-27 DIAGNOSIS — N2581 Secondary hyperparathyroidism of renal origin: Secondary | ICD-10-CM | POA: Diagnosis not present

## 2020-08-27 DIAGNOSIS — Z992 Dependence on renal dialysis: Secondary | ICD-10-CM | POA: Diagnosis not present

## 2020-08-28 DIAGNOSIS — K650 Generalized (acute) peritonitis: Secondary | ICD-10-CM | POA: Diagnosis not present

## 2020-08-28 DIAGNOSIS — N2581 Secondary hyperparathyroidism of renal origin: Secondary | ICD-10-CM | POA: Diagnosis not present

## 2020-08-28 DIAGNOSIS — Z992 Dependence on renal dialysis: Secondary | ICD-10-CM | POA: Diagnosis not present

## 2020-08-28 DIAGNOSIS — N186 End stage renal disease: Secondary | ICD-10-CM | POA: Diagnosis not present

## 2020-08-29 DIAGNOSIS — K650 Generalized (acute) peritonitis: Secondary | ICD-10-CM | POA: Diagnosis not present

## 2020-08-29 DIAGNOSIS — Z992 Dependence on renal dialysis: Secondary | ICD-10-CM | POA: Diagnosis not present

## 2020-08-29 DIAGNOSIS — N186 End stage renal disease: Secondary | ICD-10-CM | POA: Diagnosis not present

## 2020-08-29 DIAGNOSIS — N2581 Secondary hyperparathyroidism of renal origin: Secondary | ICD-10-CM | POA: Diagnosis not present

## 2020-08-30 DIAGNOSIS — K650 Generalized (acute) peritonitis: Secondary | ICD-10-CM | POA: Diagnosis not present

## 2020-08-30 DIAGNOSIS — N2581 Secondary hyperparathyroidism of renal origin: Secondary | ICD-10-CM | POA: Diagnosis not present

## 2020-08-30 DIAGNOSIS — Z992 Dependence on renal dialysis: Secondary | ICD-10-CM | POA: Diagnosis not present

## 2020-08-30 DIAGNOSIS — N186 End stage renal disease: Secondary | ICD-10-CM | POA: Diagnosis not present

## 2020-08-31 DIAGNOSIS — Z992 Dependence on renal dialysis: Secondary | ICD-10-CM | POA: Diagnosis not present

## 2020-08-31 DIAGNOSIS — N186 End stage renal disease: Secondary | ICD-10-CM | POA: Diagnosis not present

## 2020-08-31 DIAGNOSIS — K650 Generalized (acute) peritonitis: Secondary | ICD-10-CM | POA: Diagnosis not present

## 2020-08-31 DIAGNOSIS — N2581 Secondary hyperparathyroidism of renal origin: Secondary | ICD-10-CM | POA: Diagnosis not present

## 2020-09-01 DIAGNOSIS — N186 End stage renal disease: Secondary | ICD-10-CM | POA: Diagnosis not present

## 2020-09-01 DIAGNOSIS — K650 Generalized (acute) peritonitis: Secondary | ICD-10-CM | POA: Diagnosis not present

## 2020-09-01 DIAGNOSIS — Z992 Dependence on renal dialysis: Secondary | ICD-10-CM | POA: Diagnosis not present

## 2020-09-01 DIAGNOSIS — N2581 Secondary hyperparathyroidism of renal origin: Secondary | ICD-10-CM | POA: Diagnosis not present

## 2020-09-02 DIAGNOSIS — N186 End stage renal disease: Secondary | ICD-10-CM | POA: Diagnosis not present

## 2020-09-02 DIAGNOSIS — N2581 Secondary hyperparathyroidism of renal origin: Secondary | ICD-10-CM | POA: Diagnosis not present

## 2020-09-02 DIAGNOSIS — K650 Generalized (acute) peritonitis: Secondary | ICD-10-CM | POA: Diagnosis not present

## 2020-09-02 DIAGNOSIS — Z992 Dependence on renal dialysis: Secondary | ICD-10-CM | POA: Diagnosis not present

## 2020-09-03 DIAGNOSIS — Z992 Dependence on renal dialysis: Secondary | ICD-10-CM | POA: Diagnosis not present

## 2020-09-03 DIAGNOSIS — N186 End stage renal disease: Secondary | ICD-10-CM | POA: Diagnosis not present

## 2020-09-03 DIAGNOSIS — K650 Generalized (acute) peritonitis: Secondary | ICD-10-CM | POA: Diagnosis not present

## 2020-09-03 DIAGNOSIS — N2581 Secondary hyperparathyroidism of renal origin: Secondary | ICD-10-CM | POA: Diagnosis not present

## 2020-09-04 DIAGNOSIS — N2581 Secondary hyperparathyroidism of renal origin: Secondary | ICD-10-CM | POA: Diagnosis not present

## 2020-09-04 DIAGNOSIS — N186 End stage renal disease: Secondary | ICD-10-CM | POA: Diagnosis not present

## 2020-09-04 DIAGNOSIS — K650 Generalized (acute) peritonitis: Secondary | ICD-10-CM | POA: Diagnosis not present

## 2020-09-04 DIAGNOSIS — Z992 Dependence on renal dialysis: Secondary | ICD-10-CM | POA: Diagnosis not present

## 2020-09-05 DIAGNOSIS — N186 End stage renal disease: Secondary | ICD-10-CM | POA: Diagnosis not present

## 2020-09-05 DIAGNOSIS — Z992 Dependence on renal dialysis: Secondary | ICD-10-CM | POA: Diagnosis not present

## 2020-09-05 DIAGNOSIS — N2581 Secondary hyperparathyroidism of renal origin: Secondary | ICD-10-CM | POA: Diagnosis not present

## 2020-09-05 DIAGNOSIS — K650 Generalized (acute) peritonitis: Secondary | ICD-10-CM | POA: Diagnosis not present

## 2020-09-06 DIAGNOSIS — N186 End stage renal disease: Secondary | ICD-10-CM | POA: Diagnosis not present

## 2020-09-06 DIAGNOSIS — N2581 Secondary hyperparathyroidism of renal origin: Secondary | ICD-10-CM | POA: Diagnosis not present

## 2020-09-06 DIAGNOSIS — Z992 Dependence on renal dialysis: Secondary | ICD-10-CM | POA: Diagnosis not present

## 2020-09-06 DIAGNOSIS — K650 Generalized (acute) peritonitis: Secondary | ICD-10-CM | POA: Diagnosis not present

## 2020-09-07 DIAGNOSIS — K650 Generalized (acute) peritonitis: Secondary | ICD-10-CM | POA: Diagnosis not present

## 2020-09-07 DIAGNOSIS — Z992 Dependence on renal dialysis: Secondary | ICD-10-CM | POA: Diagnosis not present

## 2020-09-07 DIAGNOSIS — N2581 Secondary hyperparathyroidism of renal origin: Secondary | ICD-10-CM | POA: Diagnosis not present

## 2020-09-07 DIAGNOSIS — N186 End stage renal disease: Secondary | ICD-10-CM | POA: Diagnosis not present

## 2020-09-08 DIAGNOSIS — N2581 Secondary hyperparathyroidism of renal origin: Secondary | ICD-10-CM | POA: Diagnosis not present

## 2020-09-08 DIAGNOSIS — K650 Generalized (acute) peritonitis: Secondary | ICD-10-CM | POA: Diagnosis not present

## 2020-09-08 DIAGNOSIS — Z992 Dependence on renal dialysis: Secondary | ICD-10-CM | POA: Diagnosis not present

## 2020-09-08 DIAGNOSIS — N186 End stage renal disease: Secondary | ICD-10-CM | POA: Diagnosis not present

## 2020-09-09 DIAGNOSIS — N2581 Secondary hyperparathyroidism of renal origin: Secondary | ICD-10-CM | POA: Diagnosis not present

## 2020-09-09 DIAGNOSIS — Z992 Dependence on renal dialysis: Secondary | ICD-10-CM | POA: Diagnosis not present

## 2020-09-09 DIAGNOSIS — K650 Generalized (acute) peritonitis: Secondary | ICD-10-CM | POA: Diagnosis not present

## 2020-09-09 DIAGNOSIS — N186 End stage renal disease: Secondary | ICD-10-CM | POA: Diagnosis not present

## 2020-09-10 DIAGNOSIS — K650 Generalized (acute) peritonitis: Secondary | ICD-10-CM | POA: Diagnosis not present

## 2020-09-10 DIAGNOSIS — Z992 Dependence on renal dialysis: Secondary | ICD-10-CM | POA: Diagnosis not present

## 2020-09-10 DIAGNOSIS — N2581 Secondary hyperparathyroidism of renal origin: Secondary | ICD-10-CM | POA: Diagnosis not present

## 2020-09-10 DIAGNOSIS — N186 End stage renal disease: Secondary | ICD-10-CM | POA: Diagnosis not present

## 2020-09-11 DIAGNOSIS — Z992 Dependence on renal dialysis: Secondary | ICD-10-CM | POA: Diagnosis not present

## 2020-09-11 DIAGNOSIS — K650 Generalized (acute) peritonitis: Secondary | ICD-10-CM | POA: Diagnosis not present

## 2020-09-11 DIAGNOSIS — N2581 Secondary hyperparathyroidism of renal origin: Secondary | ICD-10-CM | POA: Diagnosis not present

## 2020-09-11 DIAGNOSIS — N186 End stage renal disease: Secondary | ICD-10-CM | POA: Diagnosis not present

## 2020-09-12 DIAGNOSIS — N186 End stage renal disease: Secondary | ICD-10-CM | POA: Diagnosis not present

## 2020-09-12 DIAGNOSIS — K650 Generalized (acute) peritonitis: Secondary | ICD-10-CM | POA: Diagnosis not present

## 2020-09-12 DIAGNOSIS — Z992 Dependence on renal dialysis: Secondary | ICD-10-CM | POA: Diagnosis not present

## 2020-09-12 DIAGNOSIS — N2581 Secondary hyperparathyroidism of renal origin: Secondary | ICD-10-CM | POA: Diagnosis not present

## 2020-09-13 DIAGNOSIS — K650 Generalized (acute) peritonitis: Secondary | ICD-10-CM | POA: Diagnosis not present

## 2020-09-13 DIAGNOSIS — N2581 Secondary hyperparathyroidism of renal origin: Secondary | ICD-10-CM | POA: Diagnosis not present

## 2020-09-13 DIAGNOSIS — N186 End stage renal disease: Secondary | ICD-10-CM | POA: Diagnosis not present

## 2020-09-13 DIAGNOSIS — Z992 Dependence on renal dialysis: Secondary | ICD-10-CM | POA: Diagnosis not present

## 2020-09-14 DIAGNOSIS — N028 Recurrent and persistent hematuria with other morphologic changes: Secondary | ICD-10-CM | POA: Diagnosis not present

## 2020-09-14 DIAGNOSIS — Z992 Dependence on renal dialysis: Secondary | ICD-10-CM | POA: Diagnosis not present

## 2020-09-14 DIAGNOSIS — K650 Generalized (acute) peritonitis: Secondary | ICD-10-CM | POA: Diagnosis not present

## 2020-09-14 DIAGNOSIS — N2581 Secondary hyperparathyroidism of renal origin: Secondary | ICD-10-CM | POA: Diagnosis not present

## 2020-09-14 DIAGNOSIS — R880 Cloudy (hemodialysis) (peritoneal) dialysis effluent: Secondary | ICD-10-CM | POA: Diagnosis not present

## 2020-09-14 DIAGNOSIS — N186 End stage renal disease: Secondary | ICD-10-CM | POA: Diagnosis not present

## 2020-09-14 DIAGNOSIS — D631 Anemia in chronic kidney disease: Secondary | ICD-10-CM | POA: Diagnosis not present

## 2020-09-14 DIAGNOSIS — D509 Iron deficiency anemia, unspecified: Secondary | ICD-10-CM | POA: Diagnosis not present

## 2020-09-14 DIAGNOSIS — N2589 Other disorders resulting from impaired renal tubular function: Secondary | ICD-10-CM | POA: Diagnosis not present

## 2020-09-15 DIAGNOSIS — N2581 Secondary hyperparathyroidism of renal origin: Secondary | ICD-10-CM | POA: Diagnosis not present

## 2020-09-15 DIAGNOSIS — N186 End stage renal disease: Secondary | ICD-10-CM | POA: Diagnosis not present

## 2020-09-15 DIAGNOSIS — D631 Anemia in chronic kidney disease: Secondary | ICD-10-CM | POA: Diagnosis not present

## 2020-09-15 DIAGNOSIS — Z992 Dependence on renal dialysis: Secondary | ICD-10-CM | POA: Diagnosis not present

## 2020-09-15 DIAGNOSIS — K650 Generalized (acute) peritonitis: Secondary | ICD-10-CM | POA: Diagnosis not present

## 2020-09-15 DIAGNOSIS — R880 Cloudy (hemodialysis) (peritoneal) dialysis effluent: Secondary | ICD-10-CM | POA: Diagnosis not present

## 2020-09-16 DIAGNOSIS — N2581 Secondary hyperparathyroidism of renal origin: Secondary | ICD-10-CM | POA: Diagnosis not present

## 2020-09-16 DIAGNOSIS — R880 Cloudy (hemodialysis) (peritoneal) dialysis effluent: Secondary | ICD-10-CM | POA: Diagnosis not present

## 2020-09-16 DIAGNOSIS — Z992 Dependence on renal dialysis: Secondary | ICD-10-CM | POA: Diagnosis not present

## 2020-09-16 DIAGNOSIS — D631 Anemia in chronic kidney disease: Secondary | ICD-10-CM | POA: Diagnosis not present

## 2020-09-16 DIAGNOSIS — N186 End stage renal disease: Secondary | ICD-10-CM | POA: Diagnosis not present

## 2020-09-16 DIAGNOSIS — K650 Generalized (acute) peritonitis: Secondary | ICD-10-CM | POA: Diagnosis not present

## 2020-09-17 DIAGNOSIS — Z992 Dependence on renal dialysis: Secondary | ICD-10-CM | POA: Diagnosis not present

## 2020-09-17 DIAGNOSIS — K650 Generalized (acute) peritonitis: Secondary | ICD-10-CM | POA: Diagnosis not present

## 2020-09-17 DIAGNOSIS — D631 Anemia in chronic kidney disease: Secondary | ICD-10-CM | POA: Diagnosis not present

## 2020-09-17 DIAGNOSIS — R880 Cloudy (hemodialysis) (peritoneal) dialysis effluent: Secondary | ICD-10-CM | POA: Diagnosis not present

## 2020-09-17 DIAGNOSIS — N2581 Secondary hyperparathyroidism of renal origin: Secondary | ICD-10-CM | POA: Diagnosis not present

## 2020-09-17 DIAGNOSIS — N186 End stage renal disease: Secondary | ICD-10-CM | POA: Diagnosis not present

## 2020-09-18 DIAGNOSIS — N186 End stage renal disease: Secondary | ICD-10-CM | POA: Diagnosis not present

## 2020-09-18 DIAGNOSIS — N2581 Secondary hyperparathyroidism of renal origin: Secondary | ICD-10-CM | POA: Diagnosis not present

## 2020-09-18 DIAGNOSIS — Z992 Dependence on renal dialysis: Secondary | ICD-10-CM | POA: Diagnosis not present

## 2020-09-18 DIAGNOSIS — D631 Anemia in chronic kidney disease: Secondary | ICD-10-CM | POA: Diagnosis not present

## 2020-09-18 DIAGNOSIS — R880 Cloudy (hemodialysis) (peritoneal) dialysis effluent: Secondary | ICD-10-CM | POA: Diagnosis not present

## 2020-09-18 DIAGNOSIS — K650 Generalized (acute) peritonitis: Secondary | ICD-10-CM | POA: Diagnosis not present

## 2020-09-19 DIAGNOSIS — R880 Cloudy (hemodialysis) (peritoneal) dialysis effluent: Secondary | ICD-10-CM | POA: Diagnosis not present

## 2020-09-19 DIAGNOSIS — D631 Anemia in chronic kidney disease: Secondary | ICD-10-CM | POA: Diagnosis not present

## 2020-09-19 DIAGNOSIS — N2581 Secondary hyperparathyroidism of renal origin: Secondary | ICD-10-CM | POA: Diagnosis not present

## 2020-09-19 DIAGNOSIS — Z992 Dependence on renal dialysis: Secondary | ICD-10-CM | POA: Diagnosis not present

## 2020-09-19 DIAGNOSIS — N186 End stage renal disease: Secondary | ICD-10-CM | POA: Diagnosis not present

## 2020-09-19 DIAGNOSIS — K650 Generalized (acute) peritonitis: Secondary | ICD-10-CM | POA: Diagnosis not present

## 2020-09-20 DIAGNOSIS — N2581 Secondary hyperparathyroidism of renal origin: Secondary | ICD-10-CM | POA: Diagnosis not present

## 2020-09-20 DIAGNOSIS — R880 Cloudy (hemodialysis) (peritoneal) dialysis effluent: Secondary | ICD-10-CM | POA: Diagnosis not present

## 2020-09-20 DIAGNOSIS — D631 Anemia in chronic kidney disease: Secondary | ICD-10-CM | POA: Diagnosis not present

## 2020-09-20 DIAGNOSIS — Z992 Dependence on renal dialysis: Secondary | ICD-10-CM | POA: Diagnosis not present

## 2020-09-20 DIAGNOSIS — K650 Generalized (acute) peritonitis: Secondary | ICD-10-CM | POA: Diagnosis not present

## 2020-09-20 DIAGNOSIS — N186 End stage renal disease: Secondary | ICD-10-CM | POA: Diagnosis not present

## 2020-09-21 DIAGNOSIS — N2581 Secondary hyperparathyroidism of renal origin: Secondary | ICD-10-CM | POA: Diagnosis not present

## 2020-09-21 DIAGNOSIS — R880 Cloudy (hemodialysis) (peritoneal) dialysis effluent: Secondary | ICD-10-CM | POA: Diagnosis not present

## 2020-09-21 DIAGNOSIS — Z992 Dependence on renal dialysis: Secondary | ICD-10-CM | POA: Diagnosis not present

## 2020-09-21 DIAGNOSIS — N186 End stage renal disease: Secondary | ICD-10-CM | POA: Diagnosis not present

## 2020-09-21 DIAGNOSIS — D631 Anemia in chronic kidney disease: Secondary | ICD-10-CM | POA: Diagnosis not present

## 2020-09-21 DIAGNOSIS — K650 Generalized (acute) peritonitis: Secondary | ICD-10-CM | POA: Diagnosis not present

## 2020-09-22 DIAGNOSIS — N2581 Secondary hyperparathyroidism of renal origin: Secondary | ICD-10-CM | POA: Diagnosis not present

## 2020-09-22 DIAGNOSIS — N186 End stage renal disease: Secondary | ICD-10-CM | POA: Diagnosis not present

## 2020-09-22 DIAGNOSIS — K650 Generalized (acute) peritonitis: Secondary | ICD-10-CM | POA: Diagnosis not present

## 2020-09-22 DIAGNOSIS — D631 Anemia in chronic kidney disease: Secondary | ICD-10-CM | POA: Diagnosis not present

## 2020-09-22 DIAGNOSIS — Z992 Dependence on renal dialysis: Secondary | ICD-10-CM | POA: Diagnosis not present

## 2020-09-22 DIAGNOSIS — R880 Cloudy (hemodialysis) (peritoneal) dialysis effluent: Secondary | ICD-10-CM | POA: Diagnosis not present

## 2020-09-23 DIAGNOSIS — Z992 Dependence on renal dialysis: Secondary | ICD-10-CM | POA: Diagnosis not present

## 2020-09-23 DIAGNOSIS — K650 Generalized (acute) peritonitis: Secondary | ICD-10-CM | POA: Diagnosis not present

## 2020-09-23 DIAGNOSIS — R880 Cloudy (hemodialysis) (peritoneal) dialysis effluent: Secondary | ICD-10-CM | POA: Diagnosis not present

## 2020-09-23 DIAGNOSIS — N186 End stage renal disease: Secondary | ICD-10-CM | POA: Diagnosis not present

## 2020-09-23 DIAGNOSIS — N2581 Secondary hyperparathyroidism of renal origin: Secondary | ICD-10-CM | POA: Diagnosis not present

## 2020-09-23 DIAGNOSIS — D631 Anemia in chronic kidney disease: Secondary | ICD-10-CM | POA: Diagnosis not present

## 2020-09-24 DIAGNOSIS — R880 Cloudy (hemodialysis) (peritoneal) dialysis effluent: Secondary | ICD-10-CM | POA: Diagnosis not present

## 2020-09-24 DIAGNOSIS — Z992 Dependence on renal dialysis: Secondary | ICD-10-CM | POA: Diagnosis not present

## 2020-09-24 DIAGNOSIS — N186 End stage renal disease: Secondary | ICD-10-CM | POA: Diagnosis not present

## 2020-09-24 DIAGNOSIS — D631 Anemia in chronic kidney disease: Secondary | ICD-10-CM | POA: Diagnosis not present

## 2020-09-24 DIAGNOSIS — K650 Generalized (acute) peritonitis: Secondary | ICD-10-CM | POA: Diagnosis not present

## 2020-09-24 DIAGNOSIS — N2581 Secondary hyperparathyroidism of renal origin: Secondary | ICD-10-CM | POA: Diagnosis not present

## 2020-09-25 DIAGNOSIS — N2581 Secondary hyperparathyroidism of renal origin: Secondary | ICD-10-CM | POA: Diagnosis not present

## 2020-09-25 DIAGNOSIS — Z992 Dependence on renal dialysis: Secondary | ICD-10-CM | POA: Diagnosis not present

## 2020-09-25 DIAGNOSIS — N186 End stage renal disease: Secondary | ICD-10-CM | POA: Diagnosis not present

## 2020-09-25 DIAGNOSIS — R880 Cloudy (hemodialysis) (peritoneal) dialysis effluent: Secondary | ICD-10-CM | POA: Diagnosis not present

## 2020-09-25 DIAGNOSIS — D631 Anemia in chronic kidney disease: Secondary | ICD-10-CM | POA: Diagnosis not present

## 2020-09-25 DIAGNOSIS — K650 Generalized (acute) peritonitis: Secondary | ICD-10-CM | POA: Diagnosis not present

## 2020-09-26 DIAGNOSIS — N2581 Secondary hyperparathyroidism of renal origin: Secondary | ICD-10-CM | POA: Diagnosis not present

## 2020-09-26 DIAGNOSIS — K650 Generalized (acute) peritonitis: Secondary | ICD-10-CM | POA: Diagnosis not present

## 2020-09-26 DIAGNOSIS — D631 Anemia in chronic kidney disease: Secondary | ICD-10-CM | POA: Diagnosis not present

## 2020-09-26 DIAGNOSIS — N186 End stage renal disease: Secondary | ICD-10-CM | POA: Diagnosis not present

## 2020-09-26 DIAGNOSIS — M1040 Other secondary gout, unspecified site: Secondary | ICD-10-CM | POA: Diagnosis not present

## 2020-09-26 DIAGNOSIS — Z992 Dependence on renal dialysis: Secondary | ICD-10-CM | POA: Diagnosis not present

## 2020-09-26 DIAGNOSIS — R880 Cloudy (hemodialysis) (peritoneal) dialysis effluent: Secondary | ICD-10-CM | POA: Diagnosis not present

## 2020-09-27 DIAGNOSIS — Z992 Dependence on renal dialysis: Secondary | ICD-10-CM | POA: Diagnosis not present

## 2020-09-27 DIAGNOSIS — R880 Cloudy (hemodialysis) (peritoneal) dialysis effluent: Secondary | ICD-10-CM | POA: Diagnosis not present

## 2020-09-27 DIAGNOSIS — D631 Anemia in chronic kidney disease: Secondary | ICD-10-CM | POA: Diagnosis not present

## 2020-09-27 DIAGNOSIS — N186 End stage renal disease: Secondary | ICD-10-CM | POA: Diagnosis not present

## 2020-09-27 DIAGNOSIS — N2581 Secondary hyperparathyroidism of renal origin: Secondary | ICD-10-CM | POA: Diagnosis not present

## 2020-09-27 DIAGNOSIS — K650 Generalized (acute) peritonitis: Secondary | ICD-10-CM | POA: Diagnosis not present

## 2020-09-28 DIAGNOSIS — R880 Cloudy (hemodialysis) (peritoneal) dialysis effluent: Secondary | ICD-10-CM | POA: Diagnosis not present

## 2020-09-28 DIAGNOSIS — K650 Generalized (acute) peritonitis: Secondary | ICD-10-CM | POA: Diagnosis not present

## 2020-09-28 DIAGNOSIS — N186 End stage renal disease: Secondary | ICD-10-CM | POA: Diagnosis not present

## 2020-09-28 DIAGNOSIS — N2581 Secondary hyperparathyroidism of renal origin: Secondary | ICD-10-CM | POA: Diagnosis not present

## 2020-09-28 DIAGNOSIS — Z992 Dependence on renal dialysis: Secondary | ICD-10-CM | POA: Diagnosis not present

## 2020-09-28 DIAGNOSIS — D631 Anemia in chronic kidney disease: Secondary | ICD-10-CM | POA: Diagnosis not present

## 2020-09-29 DIAGNOSIS — R880 Cloudy (hemodialysis) (peritoneal) dialysis effluent: Secondary | ICD-10-CM | POA: Diagnosis not present

## 2020-09-29 DIAGNOSIS — K650 Generalized (acute) peritonitis: Secondary | ICD-10-CM | POA: Diagnosis not present

## 2020-09-29 DIAGNOSIS — Z992 Dependence on renal dialysis: Secondary | ICD-10-CM | POA: Diagnosis not present

## 2020-09-29 DIAGNOSIS — D631 Anemia in chronic kidney disease: Secondary | ICD-10-CM | POA: Diagnosis not present

## 2020-09-29 DIAGNOSIS — N186 End stage renal disease: Secondary | ICD-10-CM | POA: Diagnosis not present

## 2020-09-29 DIAGNOSIS — N2581 Secondary hyperparathyroidism of renal origin: Secondary | ICD-10-CM | POA: Diagnosis not present

## 2020-09-30 DIAGNOSIS — N186 End stage renal disease: Secondary | ICD-10-CM | POA: Diagnosis not present

## 2020-09-30 DIAGNOSIS — K650 Generalized (acute) peritonitis: Secondary | ICD-10-CM | POA: Diagnosis not present

## 2020-09-30 DIAGNOSIS — R880 Cloudy (hemodialysis) (peritoneal) dialysis effluent: Secondary | ICD-10-CM | POA: Diagnosis not present

## 2020-09-30 DIAGNOSIS — Z992 Dependence on renal dialysis: Secondary | ICD-10-CM | POA: Diagnosis not present

## 2020-09-30 DIAGNOSIS — D631 Anemia in chronic kidney disease: Secondary | ICD-10-CM | POA: Diagnosis not present

## 2020-09-30 DIAGNOSIS — N2581 Secondary hyperparathyroidism of renal origin: Secondary | ICD-10-CM | POA: Diagnosis not present

## 2020-10-01 DIAGNOSIS — R880 Cloudy (hemodialysis) (peritoneal) dialysis effluent: Secondary | ICD-10-CM | POA: Diagnosis not present

## 2020-10-01 DIAGNOSIS — N2581 Secondary hyperparathyroidism of renal origin: Secondary | ICD-10-CM | POA: Diagnosis not present

## 2020-10-01 DIAGNOSIS — K650 Generalized (acute) peritonitis: Secondary | ICD-10-CM | POA: Diagnosis not present

## 2020-10-01 DIAGNOSIS — N186 End stage renal disease: Secondary | ICD-10-CM | POA: Diagnosis not present

## 2020-10-01 DIAGNOSIS — D631 Anemia in chronic kidney disease: Secondary | ICD-10-CM | POA: Diagnosis not present

## 2020-10-01 DIAGNOSIS — Z992 Dependence on renal dialysis: Secondary | ICD-10-CM | POA: Diagnosis not present

## 2020-10-02 DIAGNOSIS — N186 End stage renal disease: Secondary | ICD-10-CM | POA: Diagnosis not present

## 2020-10-02 DIAGNOSIS — N2581 Secondary hyperparathyroidism of renal origin: Secondary | ICD-10-CM | POA: Diagnosis not present

## 2020-10-02 DIAGNOSIS — D631 Anemia in chronic kidney disease: Secondary | ICD-10-CM | POA: Diagnosis not present

## 2020-10-02 DIAGNOSIS — Z992 Dependence on renal dialysis: Secondary | ICD-10-CM | POA: Diagnosis not present

## 2020-10-02 DIAGNOSIS — K650 Generalized (acute) peritonitis: Secondary | ICD-10-CM | POA: Diagnosis not present

## 2020-10-02 DIAGNOSIS — R880 Cloudy (hemodialysis) (peritoneal) dialysis effluent: Secondary | ICD-10-CM | POA: Diagnosis not present

## 2020-10-03 DIAGNOSIS — N186 End stage renal disease: Secondary | ICD-10-CM | POA: Diagnosis not present

## 2020-10-03 DIAGNOSIS — R880 Cloudy (hemodialysis) (peritoneal) dialysis effluent: Secondary | ICD-10-CM | POA: Diagnosis not present

## 2020-10-03 DIAGNOSIS — N2581 Secondary hyperparathyroidism of renal origin: Secondary | ICD-10-CM | POA: Diagnosis not present

## 2020-10-03 DIAGNOSIS — K650 Generalized (acute) peritonitis: Secondary | ICD-10-CM | POA: Diagnosis not present

## 2020-10-03 DIAGNOSIS — Z992 Dependence on renal dialysis: Secondary | ICD-10-CM | POA: Diagnosis not present

## 2020-10-03 DIAGNOSIS — D631 Anemia in chronic kidney disease: Secondary | ICD-10-CM | POA: Diagnosis not present

## 2020-10-04 DIAGNOSIS — N2581 Secondary hyperparathyroidism of renal origin: Secondary | ICD-10-CM | POA: Diagnosis not present

## 2020-10-04 DIAGNOSIS — N186 End stage renal disease: Secondary | ICD-10-CM | POA: Diagnosis not present

## 2020-10-04 DIAGNOSIS — R880 Cloudy (hemodialysis) (peritoneal) dialysis effluent: Secondary | ICD-10-CM | POA: Diagnosis not present

## 2020-10-04 DIAGNOSIS — Z992 Dependence on renal dialysis: Secondary | ICD-10-CM | POA: Diagnosis not present

## 2020-10-04 DIAGNOSIS — K650 Generalized (acute) peritonitis: Secondary | ICD-10-CM | POA: Diagnosis not present

## 2020-10-04 DIAGNOSIS — D631 Anemia in chronic kidney disease: Secondary | ICD-10-CM | POA: Diagnosis not present

## 2020-10-05 DIAGNOSIS — N186 End stage renal disease: Secondary | ICD-10-CM | POA: Diagnosis not present

## 2020-10-05 DIAGNOSIS — K650 Generalized (acute) peritonitis: Secondary | ICD-10-CM | POA: Diagnosis not present

## 2020-10-05 DIAGNOSIS — N2581 Secondary hyperparathyroidism of renal origin: Secondary | ICD-10-CM | POA: Diagnosis not present

## 2020-10-05 DIAGNOSIS — D631 Anemia in chronic kidney disease: Secondary | ICD-10-CM | POA: Diagnosis not present

## 2020-10-05 DIAGNOSIS — R880 Cloudy (hemodialysis) (peritoneal) dialysis effluent: Secondary | ICD-10-CM | POA: Diagnosis not present

## 2020-10-05 DIAGNOSIS — Z992 Dependence on renal dialysis: Secondary | ICD-10-CM | POA: Diagnosis not present

## 2020-10-06 DIAGNOSIS — D631 Anemia in chronic kidney disease: Secondary | ICD-10-CM | POA: Diagnosis not present

## 2020-10-06 DIAGNOSIS — N186 End stage renal disease: Secondary | ICD-10-CM | POA: Diagnosis not present

## 2020-10-06 DIAGNOSIS — K650 Generalized (acute) peritonitis: Secondary | ICD-10-CM | POA: Diagnosis not present

## 2020-10-06 DIAGNOSIS — Z992 Dependence on renal dialysis: Secondary | ICD-10-CM | POA: Diagnosis not present

## 2020-10-06 DIAGNOSIS — N2581 Secondary hyperparathyroidism of renal origin: Secondary | ICD-10-CM | POA: Diagnosis not present

## 2020-10-06 DIAGNOSIS — R880 Cloudy (hemodialysis) (peritoneal) dialysis effluent: Secondary | ICD-10-CM | POA: Diagnosis not present

## 2020-10-07 DIAGNOSIS — N2581 Secondary hyperparathyroidism of renal origin: Secondary | ICD-10-CM | POA: Diagnosis not present

## 2020-10-07 DIAGNOSIS — K650 Generalized (acute) peritonitis: Secondary | ICD-10-CM | POA: Diagnosis not present

## 2020-10-07 DIAGNOSIS — R880 Cloudy (hemodialysis) (peritoneal) dialysis effluent: Secondary | ICD-10-CM | POA: Diagnosis not present

## 2020-10-07 DIAGNOSIS — Z992 Dependence on renal dialysis: Secondary | ICD-10-CM | POA: Diagnosis not present

## 2020-10-07 DIAGNOSIS — D631 Anemia in chronic kidney disease: Secondary | ICD-10-CM | POA: Diagnosis not present

## 2020-10-07 DIAGNOSIS — N186 End stage renal disease: Secondary | ICD-10-CM | POA: Diagnosis not present

## 2020-10-08 DIAGNOSIS — N186 End stage renal disease: Secondary | ICD-10-CM | POA: Diagnosis not present

## 2020-10-08 DIAGNOSIS — K650 Generalized (acute) peritonitis: Secondary | ICD-10-CM | POA: Diagnosis not present

## 2020-10-08 DIAGNOSIS — R880 Cloudy (hemodialysis) (peritoneal) dialysis effluent: Secondary | ICD-10-CM | POA: Diagnosis not present

## 2020-10-08 DIAGNOSIS — N2581 Secondary hyperparathyroidism of renal origin: Secondary | ICD-10-CM | POA: Diagnosis not present

## 2020-10-08 DIAGNOSIS — Z992 Dependence on renal dialysis: Secondary | ICD-10-CM | POA: Diagnosis not present

## 2020-10-08 DIAGNOSIS — D631 Anemia in chronic kidney disease: Secondary | ICD-10-CM | POA: Diagnosis not present

## 2020-10-09 DIAGNOSIS — R880 Cloudy (hemodialysis) (peritoneal) dialysis effluent: Secondary | ICD-10-CM | POA: Diagnosis not present

## 2020-10-09 DIAGNOSIS — N2581 Secondary hyperparathyroidism of renal origin: Secondary | ICD-10-CM | POA: Diagnosis not present

## 2020-10-09 DIAGNOSIS — Z992 Dependence on renal dialysis: Secondary | ICD-10-CM | POA: Diagnosis not present

## 2020-10-09 DIAGNOSIS — N186 End stage renal disease: Secondary | ICD-10-CM | POA: Diagnosis not present

## 2020-10-09 DIAGNOSIS — K650 Generalized (acute) peritonitis: Secondary | ICD-10-CM | POA: Diagnosis not present

## 2020-10-09 DIAGNOSIS — D631 Anemia in chronic kidney disease: Secondary | ICD-10-CM | POA: Diagnosis not present

## 2020-10-10 DIAGNOSIS — R880 Cloudy (hemodialysis) (peritoneal) dialysis effluent: Secondary | ICD-10-CM | POA: Diagnosis not present

## 2020-10-10 DIAGNOSIS — D631 Anemia in chronic kidney disease: Secondary | ICD-10-CM | POA: Diagnosis not present

## 2020-10-10 DIAGNOSIS — N186 End stage renal disease: Secondary | ICD-10-CM | POA: Diagnosis not present

## 2020-10-10 DIAGNOSIS — Z992 Dependence on renal dialysis: Secondary | ICD-10-CM | POA: Diagnosis not present

## 2020-10-10 DIAGNOSIS — N2581 Secondary hyperparathyroidism of renal origin: Secondary | ICD-10-CM | POA: Diagnosis not present

## 2020-10-10 DIAGNOSIS — K650 Generalized (acute) peritonitis: Secondary | ICD-10-CM | POA: Diagnosis not present

## 2020-10-11 DIAGNOSIS — K650 Generalized (acute) peritonitis: Secondary | ICD-10-CM | POA: Diagnosis not present

## 2020-10-11 DIAGNOSIS — R880 Cloudy (hemodialysis) (peritoneal) dialysis effluent: Secondary | ICD-10-CM | POA: Diagnosis not present

## 2020-10-11 DIAGNOSIS — D631 Anemia in chronic kidney disease: Secondary | ICD-10-CM | POA: Diagnosis not present

## 2020-10-11 DIAGNOSIS — N186 End stage renal disease: Secondary | ICD-10-CM | POA: Diagnosis not present

## 2020-10-11 DIAGNOSIS — Z992 Dependence on renal dialysis: Secondary | ICD-10-CM | POA: Diagnosis not present

## 2020-10-11 DIAGNOSIS — N2581 Secondary hyperparathyroidism of renal origin: Secondary | ICD-10-CM | POA: Diagnosis not present

## 2020-10-12 DIAGNOSIS — R880 Cloudy (hemodialysis) (peritoneal) dialysis effluent: Secondary | ICD-10-CM | POA: Diagnosis not present

## 2020-10-12 DIAGNOSIS — D631 Anemia in chronic kidney disease: Secondary | ICD-10-CM | POA: Diagnosis not present

## 2020-10-12 DIAGNOSIS — Z7682 Awaiting organ transplant status: Secondary | ICD-10-CM | POA: Diagnosis not present

## 2020-10-12 DIAGNOSIS — Z992 Dependence on renal dialysis: Secondary | ICD-10-CM | POA: Diagnosis not present

## 2020-10-12 DIAGNOSIS — N028 Recurrent and persistent hematuria with other morphologic changes: Secondary | ICD-10-CM | POA: Diagnosis not present

## 2020-10-12 DIAGNOSIS — N186 End stage renal disease: Secondary | ICD-10-CM | POA: Diagnosis not present

## 2020-10-12 DIAGNOSIS — K650 Generalized (acute) peritonitis: Secondary | ICD-10-CM | POA: Diagnosis not present

## 2020-10-12 DIAGNOSIS — N2581 Secondary hyperparathyroidism of renal origin: Secondary | ICD-10-CM | POA: Diagnosis not present

## 2020-10-13 DIAGNOSIS — Z992 Dependence on renal dialysis: Secondary | ICD-10-CM | POA: Diagnosis not present

## 2020-10-13 DIAGNOSIS — N2581 Secondary hyperparathyroidism of renal origin: Secondary | ICD-10-CM | POA: Diagnosis not present

## 2020-10-13 DIAGNOSIS — K650 Generalized (acute) peritonitis: Secondary | ICD-10-CM | POA: Diagnosis not present

## 2020-10-13 DIAGNOSIS — N186 End stage renal disease: Secondary | ICD-10-CM | POA: Diagnosis not present

## 2020-10-13 DIAGNOSIS — D631 Anemia in chronic kidney disease: Secondary | ICD-10-CM | POA: Diagnosis not present

## 2020-10-13 DIAGNOSIS — R880 Cloudy (hemodialysis) (peritoneal) dialysis effluent: Secondary | ICD-10-CM | POA: Diagnosis not present

## 2020-10-14 DIAGNOSIS — N186 End stage renal disease: Secondary | ICD-10-CM | POA: Diagnosis not present

## 2020-10-14 DIAGNOSIS — N2581 Secondary hyperparathyroidism of renal origin: Secondary | ICD-10-CM | POA: Diagnosis not present

## 2020-10-14 DIAGNOSIS — D631 Anemia in chronic kidney disease: Secondary | ICD-10-CM | POA: Diagnosis not present

## 2020-10-14 DIAGNOSIS — R880 Cloudy (hemodialysis) (peritoneal) dialysis effluent: Secondary | ICD-10-CM | POA: Diagnosis not present

## 2020-10-14 DIAGNOSIS — Z992 Dependence on renal dialysis: Secondary | ICD-10-CM | POA: Diagnosis not present

## 2020-10-14 DIAGNOSIS — K650 Generalized (acute) peritonitis: Secondary | ICD-10-CM | POA: Diagnosis not present

## 2020-10-15 DIAGNOSIS — N186 End stage renal disease: Secondary | ICD-10-CM | POA: Diagnosis not present

## 2020-10-15 DIAGNOSIS — Z992 Dependence on renal dialysis: Secondary | ICD-10-CM | POA: Diagnosis not present

## 2020-10-15 DIAGNOSIS — N2581 Secondary hyperparathyroidism of renal origin: Secondary | ICD-10-CM | POA: Diagnosis not present

## 2020-10-15 DIAGNOSIS — N028 Recurrent and persistent hematuria with other morphologic changes: Secondary | ICD-10-CM | POA: Diagnosis not present

## 2020-10-16 DIAGNOSIS — N186 End stage renal disease: Secondary | ICD-10-CM | POA: Diagnosis not present

## 2020-10-16 DIAGNOSIS — N2581 Secondary hyperparathyroidism of renal origin: Secondary | ICD-10-CM | POA: Diagnosis not present

## 2020-10-16 DIAGNOSIS — Z992 Dependence on renal dialysis: Secondary | ICD-10-CM | POA: Diagnosis not present

## 2020-10-17 DIAGNOSIS — Z79899 Other long term (current) drug therapy: Secondary | ICD-10-CM | POA: Diagnosis not present

## 2020-10-17 DIAGNOSIS — Z992 Dependence on renal dialysis: Secondary | ICD-10-CM | POA: Diagnosis not present

## 2020-10-17 DIAGNOSIS — N2581 Secondary hyperparathyroidism of renal origin: Secondary | ICD-10-CM | POA: Diagnosis not present

## 2020-10-17 DIAGNOSIS — E440 Moderate protein-calorie malnutrition: Secondary | ICD-10-CM | POA: Diagnosis not present

## 2020-10-17 DIAGNOSIS — N186 End stage renal disease: Secondary | ICD-10-CM | POA: Diagnosis not present

## 2020-10-17 DIAGNOSIS — I120 Hypertensive chronic kidney disease with stage 5 chronic kidney disease or end stage renal disease: Secondary | ICD-10-CM | POA: Insufficient documentation

## 2020-10-17 DIAGNOSIS — M1040 Other secondary gout, unspecified site: Secondary | ICD-10-CM | POA: Diagnosis not present

## 2020-10-17 DIAGNOSIS — D631 Anemia in chronic kidney disease: Secondary | ICD-10-CM | POA: Diagnosis not present

## 2020-10-17 DIAGNOSIS — D509 Iron deficiency anemia, unspecified: Secondary | ICD-10-CM | POA: Diagnosis not present

## 2020-10-18 DIAGNOSIS — Z992 Dependence on renal dialysis: Secondary | ICD-10-CM | POA: Diagnosis not present

## 2020-10-18 DIAGNOSIS — E440 Moderate protein-calorie malnutrition: Secondary | ICD-10-CM | POA: Diagnosis not present

## 2020-10-18 DIAGNOSIS — Z79899 Other long term (current) drug therapy: Secondary | ICD-10-CM | POA: Diagnosis not present

## 2020-10-18 DIAGNOSIS — N186 End stage renal disease: Secondary | ICD-10-CM | POA: Diagnosis not present

## 2020-10-18 DIAGNOSIS — N2581 Secondary hyperparathyroidism of renal origin: Secondary | ICD-10-CM | POA: Diagnosis not present

## 2020-10-19 DIAGNOSIS — Z01810 Encounter for preprocedural cardiovascular examination: Secondary | ICD-10-CM | POA: Diagnosis not present

## 2020-10-19 DIAGNOSIS — Z6838 Body mass index (BMI) 38.0-38.9, adult: Secondary | ICD-10-CM | POA: Diagnosis not present

## 2020-10-19 DIAGNOSIS — T8571XA Infection and inflammatory reaction due to peritoneal dialysis catheter, initial encounter: Secondary | ICD-10-CM | POA: Diagnosis not present

## 2020-10-19 DIAGNOSIS — N186 End stage renal disease: Secondary | ICD-10-CM | POA: Diagnosis not present

## 2020-10-19 DIAGNOSIS — Z992 Dependence on renal dialysis: Secondary | ICD-10-CM | POA: Diagnosis not present

## 2020-10-19 DIAGNOSIS — I120 Hypertensive chronic kidney disease with stage 5 chronic kidney disease or end stage renal disease: Secondary | ICD-10-CM | POA: Diagnosis not present

## 2020-10-20 DIAGNOSIS — E440 Moderate protein-calorie malnutrition: Secondary | ICD-10-CM | POA: Diagnosis not present

## 2020-10-20 DIAGNOSIS — Z79899 Other long term (current) drug therapy: Secondary | ICD-10-CM | POA: Diagnosis not present

## 2020-10-20 DIAGNOSIS — N2581 Secondary hyperparathyroidism of renal origin: Secondary | ICD-10-CM | POA: Diagnosis not present

## 2020-10-20 DIAGNOSIS — N186 End stage renal disease: Secondary | ICD-10-CM | POA: Diagnosis not present

## 2020-10-20 DIAGNOSIS — Z992 Dependence on renal dialysis: Secondary | ICD-10-CM | POA: Diagnosis not present

## 2020-10-21 DIAGNOSIS — Z992 Dependence on renal dialysis: Secondary | ICD-10-CM | POA: Diagnosis not present

## 2020-10-21 DIAGNOSIS — E440 Moderate protein-calorie malnutrition: Secondary | ICD-10-CM | POA: Diagnosis not present

## 2020-10-21 DIAGNOSIS — N2581 Secondary hyperparathyroidism of renal origin: Secondary | ICD-10-CM | POA: Diagnosis not present

## 2020-10-21 DIAGNOSIS — N186 End stage renal disease: Secondary | ICD-10-CM | POA: Diagnosis not present

## 2020-10-21 DIAGNOSIS — Z79899 Other long term (current) drug therapy: Secondary | ICD-10-CM | POA: Diagnosis not present

## 2020-10-24 DIAGNOSIS — Z79899 Other long term (current) drug therapy: Secondary | ICD-10-CM | POA: Diagnosis not present

## 2020-10-24 DIAGNOSIS — Z992 Dependence on renal dialysis: Secondary | ICD-10-CM | POA: Diagnosis not present

## 2020-10-24 DIAGNOSIS — E440 Moderate protein-calorie malnutrition: Secondary | ICD-10-CM | POA: Diagnosis not present

## 2020-10-24 DIAGNOSIS — N186 End stage renal disease: Secondary | ICD-10-CM | POA: Diagnosis not present

## 2020-10-24 DIAGNOSIS — N2581 Secondary hyperparathyroidism of renal origin: Secondary | ICD-10-CM | POA: Diagnosis not present

## 2020-10-25 DIAGNOSIS — E440 Moderate protein-calorie malnutrition: Secondary | ICD-10-CM | POA: Diagnosis not present

## 2020-10-25 DIAGNOSIS — Z992 Dependence on renal dialysis: Secondary | ICD-10-CM | POA: Diagnosis not present

## 2020-10-25 DIAGNOSIS — N186 End stage renal disease: Secondary | ICD-10-CM | POA: Diagnosis not present

## 2020-10-25 DIAGNOSIS — Z79899 Other long term (current) drug therapy: Secondary | ICD-10-CM | POA: Diagnosis not present

## 2020-10-25 DIAGNOSIS — N2581 Secondary hyperparathyroidism of renal origin: Secondary | ICD-10-CM | POA: Diagnosis not present

## 2020-10-26 DIAGNOSIS — L98492 Non-pressure chronic ulcer of skin of other sites with fat layer exposed: Secondary | ICD-10-CM | POA: Diagnosis not present

## 2020-10-26 DIAGNOSIS — N186 End stage renal disease: Secondary | ICD-10-CM | POA: Diagnosis not present

## 2020-10-27 DIAGNOSIS — E440 Moderate protein-calorie malnutrition: Secondary | ICD-10-CM | POA: Diagnosis not present

## 2020-10-27 DIAGNOSIS — N186 End stage renal disease: Secondary | ICD-10-CM | POA: Diagnosis not present

## 2020-10-27 DIAGNOSIS — Z992 Dependence on renal dialysis: Secondary | ICD-10-CM | POA: Diagnosis not present

## 2020-10-27 DIAGNOSIS — Z79899 Other long term (current) drug therapy: Secondary | ICD-10-CM | POA: Diagnosis not present

## 2020-10-27 DIAGNOSIS — N2581 Secondary hyperparathyroidism of renal origin: Secondary | ICD-10-CM | POA: Diagnosis not present

## 2020-10-28 DIAGNOSIS — Z79899 Other long term (current) drug therapy: Secondary | ICD-10-CM | POA: Diagnosis not present

## 2020-10-28 DIAGNOSIS — E440 Moderate protein-calorie malnutrition: Secondary | ICD-10-CM | POA: Diagnosis not present

## 2020-10-28 DIAGNOSIS — N186 End stage renal disease: Secondary | ICD-10-CM | POA: Diagnosis not present

## 2020-10-28 DIAGNOSIS — N2581 Secondary hyperparathyroidism of renal origin: Secondary | ICD-10-CM | POA: Diagnosis not present

## 2020-10-28 DIAGNOSIS — Z992 Dependence on renal dialysis: Secondary | ICD-10-CM | POA: Diagnosis not present

## 2020-10-31 DIAGNOSIS — N2581 Secondary hyperparathyroidism of renal origin: Secondary | ICD-10-CM | POA: Diagnosis not present

## 2020-10-31 DIAGNOSIS — Z992 Dependence on renal dialysis: Secondary | ICD-10-CM | POA: Diagnosis not present

## 2020-10-31 DIAGNOSIS — N186 End stage renal disease: Secondary | ICD-10-CM | POA: Diagnosis not present

## 2020-10-31 DIAGNOSIS — E440 Moderate protein-calorie malnutrition: Secondary | ICD-10-CM | POA: Diagnosis not present

## 2020-10-31 DIAGNOSIS — Z79899 Other long term (current) drug therapy: Secondary | ICD-10-CM | POA: Diagnosis not present

## 2020-11-01 DIAGNOSIS — Z79899 Other long term (current) drug therapy: Secondary | ICD-10-CM | POA: Diagnosis not present

## 2020-11-01 DIAGNOSIS — E440 Moderate protein-calorie malnutrition: Secondary | ICD-10-CM | POA: Diagnosis not present

## 2020-11-01 DIAGNOSIS — N186 End stage renal disease: Secondary | ICD-10-CM | POA: Diagnosis not present

## 2020-11-01 DIAGNOSIS — N2581 Secondary hyperparathyroidism of renal origin: Secondary | ICD-10-CM | POA: Diagnosis not present

## 2020-11-01 DIAGNOSIS — Z992 Dependence on renal dialysis: Secondary | ICD-10-CM | POA: Diagnosis not present

## 2020-11-03 DIAGNOSIS — Z992 Dependence on renal dialysis: Secondary | ICD-10-CM | POA: Diagnosis not present

## 2020-11-03 DIAGNOSIS — N2581 Secondary hyperparathyroidism of renal origin: Secondary | ICD-10-CM | POA: Diagnosis not present

## 2020-11-03 DIAGNOSIS — E440 Moderate protein-calorie malnutrition: Secondary | ICD-10-CM | POA: Diagnosis not present

## 2020-11-03 DIAGNOSIS — Z79899 Other long term (current) drug therapy: Secondary | ICD-10-CM | POA: Diagnosis not present

## 2020-11-03 DIAGNOSIS — N186 End stage renal disease: Secondary | ICD-10-CM | POA: Diagnosis not present

## 2020-11-04 DIAGNOSIS — Z79899 Other long term (current) drug therapy: Secondary | ICD-10-CM | POA: Diagnosis not present

## 2020-11-04 DIAGNOSIS — N186 End stage renal disease: Secondary | ICD-10-CM | POA: Diagnosis not present

## 2020-11-04 DIAGNOSIS — N2581 Secondary hyperparathyroidism of renal origin: Secondary | ICD-10-CM | POA: Diagnosis not present

## 2020-11-04 DIAGNOSIS — Z992 Dependence on renal dialysis: Secondary | ICD-10-CM | POA: Diagnosis not present

## 2020-11-04 DIAGNOSIS — E440 Moderate protein-calorie malnutrition: Secondary | ICD-10-CM | POA: Diagnosis not present

## 2020-11-07 DIAGNOSIS — L98492 Non-pressure chronic ulcer of skin of other sites with fat layer exposed: Secondary | ICD-10-CM | POA: Diagnosis not present

## 2020-11-07 DIAGNOSIS — N186 End stage renal disease: Secondary | ICD-10-CM | POA: Diagnosis not present

## 2020-11-07 DIAGNOSIS — N2581 Secondary hyperparathyroidism of renal origin: Secondary | ICD-10-CM | POA: Diagnosis not present

## 2020-11-07 DIAGNOSIS — Z992 Dependence on renal dialysis: Secondary | ICD-10-CM | POA: Diagnosis not present

## 2020-11-07 DIAGNOSIS — E440 Moderate protein-calorie malnutrition: Secondary | ICD-10-CM | POA: Diagnosis not present

## 2020-11-07 DIAGNOSIS — Z79899 Other long term (current) drug therapy: Secondary | ICD-10-CM | POA: Diagnosis not present

## 2020-11-08 DIAGNOSIS — E440 Moderate protein-calorie malnutrition: Secondary | ICD-10-CM | POA: Diagnosis not present

## 2020-11-08 DIAGNOSIS — N186 End stage renal disease: Secondary | ICD-10-CM | POA: Diagnosis not present

## 2020-11-08 DIAGNOSIS — N2581 Secondary hyperparathyroidism of renal origin: Secondary | ICD-10-CM | POA: Diagnosis not present

## 2020-11-08 DIAGNOSIS — Z79899 Other long term (current) drug therapy: Secondary | ICD-10-CM | POA: Diagnosis not present

## 2020-11-08 DIAGNOSIS — Z992 Dependence on renal dialysis: Secondary | ICD-10-CM | POA: Diagnosis not present

## 2020-11-10 DIAGNOSIS — N186 End stage renal disease: Secondary | ICD-10-CM | POA: Diagnosis not present

## 2020-11-10 DIAGNOSIS — N2581 Secondary hyperparathyroidism of renal origin: Secondary | ICD-10-CM | POA: Diagnosis not present

## 2020-11-10 DIAGNOSIS — Z79899 Other long term (current) drug therapy: Secondary | ICD-10-CM | POA: Diagnosis not present

## 2020-11-10 DIAGNOSIS — E440 Moderate protein-calorie malnutrition: Secondary | ICD-10-CM | POA: Diagnosis not present

## 2020-11-10 DIAGNOSIS — Z992 Dependence on renal dialysis: Secondary | ICD-10-CM | POA: Diagnosis not present

## 2020-11-11 DIAGNOSIS — E440 Moderate protein-calorie malnutrition: Secondary | ICD-10-CM | POA: Diagnosis not present

## 2020-11-11 DIAGNOSIS — Z79899 Other long term (current) drug therapy: Secondary | ICD-10-CM | POA: Diagnosis not present

## 2020-11-11 DIAGNOSIS — N186 End stage renal disease: Secondary | ICD-10-CM | POA: Diagnosis not present

## 2020-11-11 DIAGNOSIS — N2581 Secondary hyperparathyroidism of renal origin: Secondary | ICD-10-CM | POA: Diagnosis not present

## 2020-11-11 DIAGNOSIS — Z992 Dependence on renal dialysis: Secondary | ICD-10-CM | POA: Diagnosis not present

## 2020-11-14 DIAGNOSIS — Z79899 Other long term (current) drug therapy: Secondary | ICD-10-CM | POA: Diagnosis not present

## 2020-11-14 DIAGNOSIS — N186 End stage renal disease: Secondary | ICD-10-CM | POA: Diagnosis not present

## 2020-11-14 DIAGNOSIS — E440 Moderate protein-calorie malnutrition: Secondary | ICD-10-CM | POA: Diagnosis not present

## 2020-11-14 DIAGNOSIS — Z992 Dependence on renal dialysis: Secondary | ICD-10-CM | POA: Diagnosis not present

## 2020-11-14 DIAGNOSIS — N2581 Secondary hyperparathyroidism of renal origin: Secondary | ICD-10-CM | POA: Diagnosis not present

## 2020-11-15 DIAGNOSIS — N186 End stage renal disease: Secondary | ICD-10-CM | POA: Diagnosis not present

## 2020-11-15 DIAGNOSIS — Z79899 Other long term (current) drug therapy: Secondary | ICD-10-CM | POA: Diagnosis not present

## 2020-11-15 DIAGNOSIS — L98492 Non-pressure chronic ulcer of skin of other sites with fat layer exposed: Secondary | ICD-10-CM | POA: Diagnosis not present

## 2020-11-15 DIAGNOSIS — N2581 Secondary hyperparathyroidism of renal origin: Secondary | ICD-10-CM | POA: Diagnosis not present

## 2020-11-15 DIAGNOSIS — Z992 Dependence on renal dialysis: Secondary | ICD-10-CM | POA: Diagnosis not present

## 2020-11-15 DIAGNOSIS — N028 Recurrent and persistent hematuria with other morphologic changes: Secondary | ICD-10-CM | POA: Diagnosis not present

## 2020-11-15 DIAGNOSIS — D509 Iron deficiency anemia, unspecified: Secondary | ICD-10-CM | POA: Diagnosis not present

## 2020-11-15 DIAGNOSIS — E440 Moderate protein-calorie malnutrition: Secondary | ICD-10-CM | POA: Diagnosis not present

## 2020-11-17 DIAGNOSIS — E440 Moderate protein-calorie malnutrition: Secondary | ICD-10-CM | POA: Diagnosis not present

## 2020-11-17 DIAGNOSIS — N186 End stage renal disease: Secondary | ICD-10-CM | POA: Diagnosis not present

## 2020-11-17 DIAGNOSIS — M1040 Other secondary gout, unspecified site: Secondary | ICD-10-CM | POA: Diagnosis not present

## 2020-11-17 DIAGNOSIS — N2581 Secondary hyperparathyroidism of renal origin: Secondary | ICD-10-CM | POA: Diagnosis not present

## 2020-11-17 DIAGNOSIS — D509 Iron deficiency anemia, unspecified: Secondary | ICD-10-CM | POA: Diagnosis not present

## 2020-11-17 DIAGNOSIS — Z992 Dependence on renal dialysis: Secondary | ICD-10-CM | POA: Diagnosis not present

## 2020-11-18 DIAGNOSIS — N2581 Secondary hyperparathyroidism of renal origin: Secondary | ICD-10-CM | POA: Diagnosis not present

## 2020-11-18 DIAGNOSIS — Z992 Dependence on renal dialysis: Secondary | ICD-10-CM | POA: Diagnosis not present

## 2020-11-18 DIAGNOSIS — N186 End stage renal disease: Secondary | ICD-10-CM | POA: Diagnosis not present

## 2020-11-18 DIAGNOSIS — E440 Moderate protein-calorie malnutrition: Secondary | ICD-10-CM | POA: Diagnosis not present

## 2020-11-18 DIAGNOSIS — D509 Iron deficiency anemia, unspecified: Secondary | ICD-10-CM | POA: Diagnosis not present

## 2020-11-21 DIAGNOSIS — Z992 Dependence on renal dialysis: Secondary | ICD-10-CM | POA: Diagnosis not present

## 2020-11-21 DIAGNOSIS — N186 End stage renal disease: Secondary | ICD-10-CM | POA: Diagnosis not present

## 2020-11-21 DIAGNOSIS — N2581 Secondary hyperparathyroidism of renal origin: Secondary | ICD-10-CM | POA: Diagnosis not present

## 2020-11-21 DIAGNOSIS — I120 Hypertensive chronic kidney disease with stage 5 chronic kidney disease or end stage renal disease: Secondary | ICD-10-CM | POA: Diagnosis not present

## 2020-11-21 DIAGNOSIS — D631 Anemia in chronic kidney disease: Secondary | ICD-10-CM | POA: Diagnosis not present

## 2020-11-22 DIAGNOSIS — Z992 Dependence on renal dialysis: Secondary | ICD-10-CM | POA: Diagnosis not present

## 2020-11-22 DIAGNOSIS — I120 Hypertensive chronic kidney disease with stage 5 chronic kidney disease or end stage renal disease: Secondary | ICD-10-CM | POA: Diagnosis not present

## 2020-11-22 DIAGNOSIS — N186 End stage renal disease: Secondary | ICD-10-CM | POA: Diagnosis not present

## 2020-11-22 DIAGNOSIS — N2581 Secondary hyperparathyroidism of renal origin: Secondary | ICD-10-CM | POA: Diagnosis not present

## 2020-11-22 DIAGNOSIS — D631 Anemia in chronic kidney disease: Secondary | ICD-10-CM | POA: Diagnosis not present

## 2020-11-24 DIAGNOSIS — N2581 Secondary hyperparathyroidism of renal origin: Secondary | ICD-10-CM | POA: Diagnosis not present

## 2020-11-24 DIAGNOSIS — I120 Hypertensive chronic kidney disease with stage 5 chronic kidney disease or end stage renal disease: Secondary | ICD-10-CM | POA: Diagnosis not present

## 2020-11-24 DIAGNOSIS — D631 Anemia in chronic kidney disease: Secondary | ICD-10-CM | POA: Diagnosis not present

## 2020-11-24 DIAGNOSIS — N186 End stage renal disease: Secondary | ICD-10-CM | POA: Diagnosis not present

## 2020-11-24 DIAGNOSIS — Z992 Dependence on renal dialysis: Secondary | ICD-10-CM | POA: Diagnosis not present

## 2020-11-25 DIAGNOSIS — Z992 Dependence on renal dialysis: Secondary | ICD-10-CM | POA: Diagnosis not present

## 2020-11-25 DIAGNOSIS — N2581 Secondary hyperparathyroidism of renal origin: Secondary | ICD-10-CM | POA: Diagnosis not present

## 2020-11-25 DIAGNOSIS — N186 End stage renal disease: Secondary | ICD-10-CM | POA: Diagnosis not present

## 2020-11-25 DIAGNOSIS — D631 Anemia in chronic kidney disease: Secondary | ICD-10-CM | POA: Diagnosis not present

## 2020-11-25 DIAGNOSIS — I120 Hypertensive chronic kidney disease with stage 5 chronic kidney disease or end stage renal disease: Secondary | ICD-10-CM | POA: Diagnosis not present

## 2020-11-28 DIAGNOSIS — D631 Anemia in chronic kidney disease: Secondary | ICD-10-CM | POA: Diagnosis not present

## 2020-11-28 DIAGNOSIS — N186 End stage renal disease: Secondary | ICD-10-CM | POA: Diagnosis not present

## 2020-11-28 DIAGNOSIS — Z992 Dependence on renal dialysis: Secondary | ICD-10-CM | POA: Diagnosis not present

## 2020-11-28 DIAGNOSIS — I120 Hypertensive chronic kidney disease with stage 5 chronic kidney disease or end stage renal disease: Secondary | ICD-10-CM | POA: Diagnosis not present

## 2020-11-28 DIAGNOSIS — N2581 Secondary hyperparathyroidism of renal origin: Secondary | ICD-10-CM | POA: Diagnosis not present

## 2020-11-29 DIAGNOSIS — N186 End stage renal disease: Secondary | ICD-10-CM | POA: Diagnosis not present

## 2020-11-29 DIAGNOSIS — I120 Hypertensive chronic kidney disease with stage 5 chronic kidney disease or end stage renal disease: Secondary | ICD-10-CM | POA: Diagnosis not present

## 2020-11-29 DIAGNOSIS — N2581 Secondary hyperparathyroidism of renal origin: Secondary | ICD-10-CM | POA: Diagnosis not present

## 2020-11-29 DIAGNOSIS — D631 Anemia in chronic kidney disease: Secondary | ICD-10-CM | POA: Diagnosis not present

## 2020-11-29 DIAGNOSIS — Z992 Dependence on renal dialysis: Secondary | ICD-10-CM | POA: Diagnosis not present

## 2020-12-01 DIAGNOSIS — N2581 Secondary hyperparathyroidism of renal origin: Secondary | ICD-10-CM | POA: Diagnosis not present

## 2020-12-01 DIAGNOSIS — D631 Anemia in chronic kidney disease: Secondary | ICD-10-CM | POA: Diagnosis not present

## 2020-12-01 DIAGNOSIS — I120 Hypertensive chronic kidney disease with stage 5 chronic kidney disease or end stage renal disease: Secondary | ICD-10-CM | POA: Diagnosis not present

## 2020-12-01 DIAGNOSIS — N186 End stage renal disease: Secondary | ICD-10-CM | POA: Diagnosis not present

## 2020-12-01 DIAGNOSIS — Z992 Dependence on renal dialysis: Secondary | ICD-10-CM | POA: Diagnosis not present

## 2020-12-02 DIAGNOSIS — N186 End stage renal disease: Secondary | ICD-10-CM | POA: Diagnosis not present

## 2020-12-02 DIAGNOSIS — N2581 Secondary hyperparathyroidism of renal origin: Secondary | ICD-10-CM | POA: Diagnosis not present

## 2020-12-02 DIAGNOSIS — I120 Hypertensive chronic kidney disease with stage 5 chronic kidney disease or end stage renal disease: Secondary | ICD-10-CM | POA: Diagnosis not present

## 2020-12-02 DIAGNOSIS — Z992 Dependence on renal dialysis: Secondary | ICD-10-CM | POA: Diagnosis not present

## 2020-12-02 DIAGNOSIS — D631 Anemia in chronic kidney disease: Secondary | ICD-10-CM | POA: Diagnosis not present

## 2020-12-05 DIAGNOSIS — D631 Anemia in chronic kidney disease: Secondary | ICD-10-CM | POA: Diagnosis not present

## 2020-12-05 DIAGNOSIS — N2581 Secondary hyperparathyroidism of renal origin: Secondary | ICD-10-CM | POA: Diagnosis not present

## 2020-12-05 DIAGNOSIS — Z992 Dependence on renal dialysis: Secondary | ICD-10-CM | POA: Diagnosis not present

## 2020-12-05 DIAGNOSIS — N186 End stage renal disease: Secondary | ICD-10-CM | POA: Diagnosis not present

## 2020-12-05 DIAGNOSIS — I120 Hypertensive chronic kidney disease with stage 5 chronic kidney disease or end stage renal disease: Secondary | ICD-10-CM | POA: Diagnosis not present

## 2020-12-06 DIAGNOSIS — N186 End stage renal disease: Secondary | ICD-10-CM | POA: Diagnosis not present

## 2020-12-06 DIAGNOSIS — I120 Hypertensive chronic kidney disease with stage 5 chronic kidney disease or end stage renal disease: Secondary | ICD-10-CM | POA: Diagnosis not present

## 2020-12-06 DIAGNOSIS — D631 Anemia in chronic kidney disease: Secondary | ICD-10-CM | POA: Diagnosis not present

## 2020-12-06 DIAGNOSIS — Z992 Dependence on renal dialysis: Secondary | ICD-10-CM | POA: Diagnosis not present

## 2020-12-06 DIAGNOSIS — N2581 Secondary hyperparathyroidism of renal origin: Secondary | ICD-10-CM | POA: Diagnosis not present

## 2020-12-08 DIAGNOSIS — I120 Hypertensive chronic kidney disease with stage 5 chronic kidney disease or end stage renal disease: Secondary | ICD-10-CM | POA: Diagnosis not present

## 2020-12-08 DIAGNOSIS — N186 End stage renal disease: Secondary | ICD-10-CM | POA: Diagnosis not present

## 2020-12-08 DIAGNOSIS — Z992 Dependence on renal dialysis: Secondary | ICD-10-CM | POA: Diagnosis not present

## 2020-12-08 DIAGNOSIS — D631 Anemia in chronic kidney disease: Secondary | ICD-10-CM | POA: Diagnosis not present

## 2020-12-08 DIAGNOSIS — N2581 Secondary hyperparathyroidism of renal origin: Secondary | ICD-10-CM | POA: Diagnosis not present

## 2020-12-09 DIAGNOSIS — N2581 Secondary hyperparathyroidism of renal origin: Secondary | ICD-10-CM | POA: Diagnosis not present

## 2020-12-09 DIAGNOSIS — N186 End stage renal disease: Secondary | ICD-10-CM | POA: Diagnosis not present

## 2020-12-09 DIAGNOSIS — I120 Hypertensive chronic kidney disease with stage 5 chronic kidney disease or end stage renal disease: Secondary | ICD-10-CM | POA: Diagnosis not present

## 2020-12-09 DIAGNOSIS — Z992 Dependence on renal dialysis: Secondary | ICD-10-CM | POA: Diagnosis not present

## 2020-12-09 DIAGNOSIS — D631 Anemia in chronic kidney disease: Secondary | ICD-10-CM | POA: Diagnosis not present

## 2020-12-12 DIAGNOSIS — Z992 Dependence on renal dialysis: Secondary | ICD-10-CM | POA: Diagnosis not present

## 2020-12-12 DIAGNOSIS — D631 Anemia in chronic kidney disease: Secondary | ICD-10-CM | POA: Diagnosis not present

## 2020-12-12 DIAGNOSIS — N186 End stage renal disease: Secondary | ICD-10-CM | POA: Diagnosis not present

## 2020-12-12 DIAGNOSIS — N2581 Secondary hyperparathyroidism of renal origin: Secondary | ICD-10-CM | POA: Diagnosis not present

## 2020-12-12 DIAGNOSIS — I120 Hypertensive chronic kidney disease with stage 5 chronic kidney disease or end stage renal disease: Secondary | ICD-10-CM | POA: Diagnosis not present

## 2020-12-13 DIAGNOSIS — Z992 Dependence on renal dialysis: Secondary | ICD-10-CM | POA: Diagnosis not present

## 2020-12-13 DIAGNOSIS — D631 Anemia in chronic kidney disease: Secondary | ICD-10-CM | POA: Diagnosis not present

## 2020-12-13 DIAGNOSIS — D509 Iron deficiency anemia, unspecified: Secondary | ICD-10-CM | POA: Diagnosis not present

## 2020-12-13 DIAGNOSIS — N2589 Other disorders resulting from impaired renal tubular function: Secondary | ICD-10-CM | POA: Diagnosis not present

## 2020-12-13 DIAGNOSIS — N186 End stage renal disease: Secondary | ICD-10-CM | POA: Diagnosis not present

## 2020-12-13 DIAGNOSIS — N2581 Secondary hyperparathyroidism of renal origin: Secondary | ICD-10-CM | POA: Diagnosis not present

## 2020-12-13 DIAGNOSIS — I120 Hypertensive chronic kidney disease with stage 5 chronic kidney disease or end stage renal disease: Secondary | ICD-10-CM | POA: Diagnosis not present

## 2020-12-14 DIAGNOSIS — Z992 Dependence on renal dialysis: Secondary | ICD-10-CM | POA: Diagnosis not present

## 2020-12-14 DIAGNOSIS — N186 End stage renal disease: Secondary | ICD-10-CM | POA: Diagnosis not present

## 2020-12-14 DIAGNOSIS — D631 Anemia in chronic kidney disease: Secondary | ICD-10-CM | POA: Diagnosis not present

## 2020-12-14 DIAGNOSIS — N2589 Other disorders resulting from impaired renal tubular function: Secondary | ICD-10-CM | POA: Diagnosis not present

## 2020-12-14 DIAGNOSIS — M1040 Other secondary gout, unspecified site: Secondary | ICD-10-CM | POA: Diagnosis not present

## 2020-12-14 DIAGNOSIS — N2581 Secondary hyperparathyroidism of renal origin: Secondary | ICD-10-CM | POA: Diagnosis not present

## 2020-12-14 DIAGNOSIS — D509 Iron deficiency anemia, unspecified: Secondary | ICD-10-CM | POA: Diagnosis not present

## 2020-12-15 DIAGNOSIS — N2581 Secondary hyperparathyroidism of renal origin: Secondary | ICD-10-CM | POA: Diagnosis not present

## 2020-12-15 DIAGNOSIS — Z992 Dependence on renal dialysis: Secondary | ICD-10-CM | POA: Diagnosis not present

## 2020-12-15 DIAGNOSIS — D509 Iron deficiency anemia, unspecified: Secondary | ICD-10-CM | POA: Diagnosis not present

## 2020-12-15 DIAGNOSIS — D631 Anemia in chronic kidney disease: Secondary | ICD-10-CM | POA: Diagnosis not present

## 2020-12-15 DIAGNOSIS — N186 End stage renal disease: Secondary | ICD-10-CM | POA: Diagnosis not present

## 2020-12-15 DIAGNOSIS — N2589 Other disorders resulting from impaired renal tubular function: Secondary | ICD-10-CM | POA: Diagnosis not present

## 2020-12-16 DIAGNOSIS — N2581 Secondary hyperparathyroidism of renal origin: Secondary | ICD-10-CM | POA: Diagnosis not present

## 2020-12-16 DIAGNOSIS — N186 End stage renal disease: Secondary | ICD-10-CM | POA: Diagnosis not present

## 2020-12-16 DIAGNOSIS — N2589 Other disorders resulting from impaired renal tubular function: Secondary | ICD-10-CM | POA: Diagnosis not present

## 2020-12-16 DIAGNOSIS — D631 Anemia in chronic kidney disease: Secondary | ICD-10-CM | POA: Diagnosis not present

## 2020-12-16 DIAGNOSIS — Z992 Dependence on renal dialysis: Secondary | ICD-10-CM | POA: Diagnosis not present

## 2020-12-16 DIAGNOSIS — D509 Iron deficiency anemia, unspecified: Secondary | ICD-10-CM | POA: Diagnosis not present

## 2020-12-19 DIAGNOSIS — N2581 Secondary hyperparathyroidism of renal origin: Secondary | ICD-10-CM | POA: Diagnosis not present

## 2020-12-19 DIAGNOSIS — N186 End stage renal disease: Secondary | ICD-10-CM | POA: Diagnosis not present

## 2020-12-19 DIAGNOSIS — D631 Anemia in chronic kidney disease: Secondary | ICD-10-CM | POA: Diagnosis not present

## 2020-12-19 DIAGNOSIS — N2589 Other disorders resulting from impaired renal tubular function: Secondary | ICD-10-CM | POA: Diagnosis not present

## 2020-12-19 DIAGNOSIS — D509 Iron deficiency anemia, unspecified: Secondary | ICD-10-CM | POA: Diagnosis not present

## 2020-12-19 DIAGNOSIS — Z992 Dependence on renal dialysis: Secondary | ICD-10-CM | POA: Diagnosis not present

## 2020-12-20 DIAGNOSIS — N186 End stage renal disease: Secondary | ICD-10-CM | POA: Diagnosis not present

## 2020-12-20 DIAGNOSIS — D631 Anemia in chronic kidney disease: Secondary | ICD-10-CM | POA: Diagnosis not present

## 2020-12-20 DIAGNOSIS — N2589 Other disorders resulting from impaired renal tubular function: Secondary | ICD-10-CM | POA: Diagnosis not present

## 2020-12-20 DIAGNOSIS — D509 Iron deficiency anemia, unspecified: Secondary | ICD-10-CM | POA: Diagnosis not present

## 2020-12-20 DIAGNOSIS — N2581 Secondary hyperparathyroidism of renal origin: Secondary | ICD-10-CM | POA: Diagnosis not present

## 2020-12-20 DIAGNOSIS — Z992 Dependence on renal dialysis: Secondary | ICD-10-CM | POA: Diagnosis not present

## 2020-12-22 DIAGNOSIS — N2589 Other disorders resulting from impaired renal tubular function: Secondary | ICD-10-CM | POA: Diagnosis not present

## 2020-12-22 DIAGNOSIS — N2581 Secondary hyperparathyroidism of renal origin: Secondary | ICD-10-CM | POA: Diagnosis not present

## 2020-12-22 DIAGNOSIS — D509 Iron deficiency anemia, unspecified: Secondary | ICD-10-CM | POA: Diagnosis not present

## 2020-12-22 DIAGNOSIS — D631 Anemia in chronic kidney disease: Secondary | ICD-10-CM | POA: Diagnosis not present

## 2020-12-22 DIAGNOSIS — Z992 Dependence on renal dialysis: Secondary | ICD-10-CM | POA: Diagnosis not present

## 2020-12-22 DIAGNOSIS — N186 End stage renal disease: Secondary | ICD-10-CM | POA: Diagnosis not present

## 2020-12-23 DIAGNOSIS — D509 Iron deficiency anemia, unspecified: Secondary | ICD-10-CM | POA: Diagnosis not present

## 2020-12-23 DIAGNOSIS — N2589 Other disorders resulting from impaired renal tubular function: Secondary | ICD-10-CM | POA: Diagnosis not present

## 2020-12-23 DIAGNOSIS — D631 Anemia in chronic kidney disease: Secondary | ICD-10-CM | POA: Diagnosis not present

## 2020-12-23 DIAGNOSIS — Z992 Dependence on renal dialysis: Secondary | ICD-10-CM | POA: Diagnosis not present

## 2020-12-23 DIAGNOSIS — N186 End stage renal disease: Secondary | ICD-10-CM | POA: Diagnosis not present

## 2020-12-23 DIAGNOSIS — N2581 Secondary hyperparathyroidism of renal origin: Secondary | ICD-10-CM | POA: Diagnosis not present

## 2020-12-26 DIAGNOSIS — D509 Iron deficiency anemia, unspecified: Secondary | ICD-10-CM | POA: Diagnosis not present

## 2020-12-26 DIAGNOSIS — N186 End stage renal disease: Secondary | ICD-10-CM | POA: Diagnosis not present

## 2020-12-26 DIAGNOSIS — Z992 Dependence on renal dialysis: Secondary | ICD-10-CM | POA: Diagnosis not present

## 2020-12-26 DIAGNOSIS — D631 Anemia in chronic kidney disease: Secondary | ICD-10-CM | POA: Diagnosis not present

## 2020-12-26 DIAGNOSIS — N2589 Other disorders resulting from impaired renal tubular function: Secondary | ICD-10-CM | POA: Diagnosis not present

## 2020-12-26 DIAGNOSIS — N2581 Secondary hyperparathyroidism of renal origin: Secondary | ICD-10-CM | POA: Diagnosis not present

## 2020-12-27 DIAGNOSIS — N2589 Other disorders resulting from impaired renal tubular function: Secondary | ICD-10-CM | POA: Diagnosis not present

## 2020-12-27 DIAGNOSIS — Z992 Dependence on renal dialysis: Secondary | ICD-10-CM | POA: Diagnosis not present

## 2020-12-27 DIAGNOSIS — N2581 Secondary hyperparathyroidism of renal origin: Secondary | ICD-10-CM | POA: Diagnosis not present

## 2020-12-27 DIAGNOSIS — D509 Iron deficiency anemia, unspecified: Secondary | ICD-10-CM | POA: Diagnosis not present

## 2020-12-27 DIAGNOSIS — N186 End stage renal disease: Secondary | ICD-10-CM | POA: Diagnosis not present

## 2020-12-27 DIAGNOSIS — D631 Anemia in chronic kidney disease: Secondary | ICD-10-CM | POA: Diagnosis not present

## 2020-12-28 DIAGNOSIS — N186 End stage renal disease: Secondary | ICD-10-CM | POA: Diagnosis not present

## 2020-12-28 DIAGNOSIS — Z992 Dependence on renal dialysis: Secondary | ICD-10-CM | POA: Diagnosis not present

## 2020-12-28 DIAGNOSIS — N2589 Other disorders resulting from impaired renal tubular function: Secondary | ICD-10-CM | POA: Diagnosis not present

## 2020-12-28 DIAGNOSIS — N2581 Secondary hyperparathyroidism of renal origin: Secondary | ICD-10-CM | POA: Diagnosis not present

## 2020-12-28 DIAGNOSIS — D631 Anemia in chronic kidney disease: Secondary | ICD-10-CM | POA: Diagnosis not present

## 2020-12-28 DIAGNOSIS — D509 Iron deficiency anemia, unspecified: Secondary | ICD-10-CM | POA: Diagnosis not present

## 2020-12-29 DIAGNOSIS — N186 End stage renal disease: Secondary | ICD-10-CM | POA: Diagnosis not present

## 2020-12-29 DIAGNOSIS — N2581 Secondary hyperparathyroidism of renal origin: Secondary | ICD-10-CM | POA: Diagnosis not present

## 2020-12-29 DIAGNOSIS — Z992 Dependence on renal dialysis: Secondary | ICD-10-CM | POA: Diagnosis not present

## 2020-12-29 DIAGNOSIS — D631 Anemia in chronic kidney disease: Secondary | ICD-10-CM | POA: Diagnosis not present

## 2020-12-29 DIAGNOSIS — N2589 Other disorders resulting from impaired renal tubular function: Secondary | ICD-10-CM | POA: Diagnosis not present

## 2020-12-29 DIAGNOSIS — D509 Iron deficiency anemia, unspecified: Secondary | ICD-10-CM | POA: Diagnosis not present

## 2020-12-30 DIAGNOSIS — N186 End stage renal disease: Secondary | ICD-10-CM | POA: Diagnosis not present

## 2020-12-30 DIAGNOSIS — N2581 Secondary hyperparathyroidism of renal origin: Secondary | ICD-10-CM | POA: Diagnosis not present

## 2020-12-30 DIAGNOSIS — D509 Iron deficiency anemia, unspecified: Secondary | ICD-10-CM | POA: Diagnosis not present

## 2020-12-30 DIAGNOSIS — Z992 Dependence on renal dialysis: Secondary | ICD-10-CM | POA: Diagnosis not present

## 2020-12-30 DIAGNOSIS — N2589 Other disorders resulting from impaired renal tubular function: Secondary | ICD-10-CM | POA: Diagnosis not present

## 2020-12-30 DIAGNOSIS — D631 Anemia in chronic kidney disease: Secondary | ICD-10-CM | POA: Diagnosis not present

## 2021-01-02 DIAGNOSIS — D631 Anemia in chronic kidney disease: Secondary | ICD-10-CM | POA: Diagnosis not present

## 2021-01-02 DIAGNOSIS — Z992 Dependence on renal dialysis: Secondary | ICD-10-CM | POA: Diagnosis not present

## 2021-01-02 DIAGNOSIS — N186 End stage renal disease: Secondary | ICD-10-CM | POA: Diagnosis not present

## 2021-01-02 DIAGNOSIS — N2581 Secondary hyperparathyroidism of renal origin: Secondary | ICD-10-CM | POA: Diagnosis not present

## 2021-01-02 DIAGNOSIS — D509 Iron deficiency anemia, unspecified: Secondary | ICD-10-CM | POA: Diagnosis not present

## 2021-01-02 DIAGNOSIS — N2589 Other disorders resulting from impaired renal tubular function: Secondary | ICD-10-CM | POA: Diagnosis not present

## 2021-01-03 DIAGNOSIS — D631 Anemia in chronic kidney disease: Secondary | ICD-10-CM | POA: Diagnosis not present

## 2021-01-03 DIAGNOSIS — D509 Iron deficiency anemia, unspecified: Secondary | ICD-10-CM | POA: Diagnosis not present

## 2021-01-03 DIAGNOSIS — N2589 Other disorders resulting from impaired renal tubular function: Secondary | ICD-10-CM | POA: Diagnosis not present

## 2021-01-03 DIAGNOSIS — Z992 Dependence on renal dialysis: Secondary | ICD-10-CM | POA: Diagnosis not present

## 2021-01-03 DIAGNOSIS — N2581 Secondary hyperparathyroidism of renal origin: Secondary | ICD-10-CM | POA: Diagnosis not present

## 2021-01-03 DIAGNOSIS — N186 End stage renal disease: Secondary | ICD-10-CM | POA: Diagnosis not present

## 2021-01-05 DIAGNOSIS — N2589 Other disorders resulting from impaired renal tubular function: Secondary | ICD-10-CM | POA: Diagnosis not present

## 2021-01-05 DIAGNOSIS — N186 End stage renal disease: Secondary | ICD-10-CM | POA: Diagnosis not present

## 2021-01-05 DIAGNOSIS — D631 Anemia in chronic kidney disease: Secondary | ICD-10-CM | POA: Diagnosis not present

## 2021-01-05 DIAGNOSIS — Z992 Dependence on renal dialysis: Secondary | ICD-10-CM | POA: Diagnosis not present

## 2021-01-05 DIAGNOSIS — D509 Iron deficiency anemia, unspecified: Secondary | ICD-10-CM | POA: Diagnosis not present

## 2021-01-05 DIAGNOSIS — N2581 Secondary hyperparathyroidism of renal origin: Secondary | ICD-10-CM | POA: Diagnosis not present

## 2021-01-06 DIAGNOSIS — D631 Anemia in chronic kidney disease: Secondary | ICD-10-CM | POA: Diagnosis not present

## 2021-01-06 DIAGNOSIS — N186 End stage renal disease: Secondary | ICD-10-CM | POA: Diagnosis not present

## 2021-01-06 DIAGNOSIS — N2589 Other disorders resulting from impaired renal tubular function: Secondary | ICD-10-CM | POA: Diagnosis not present

## 2021-01-06 DIAGNOSIS — N2581 Secondary hyperparathyroidism of renal origin: Secondary | ICD-10-CM | POA: Diagnosis not present

## 2021-01-06 DIAGNOSIS — Z992 Dependence on renal dialysis: Secondary | ICD-10-CM | POA: Diagnosis not present

## 2021-01-06 DIAGNOSIS — D509 Iron deficiency anemia, unspecified: Secondary | ICD-10-CM | POA: Diagnosis not present

## 2021-01-09 DIAGNOSIS — D631 Anemia in chronic kidney disease: Secondary | ICD-10-CM | POA: Diagnosis not present

## 2021-01-09 DIAGNOSIS — N186 End stage renal disease: Secondary | ICD-10-CM | POA: Diagnosis not present

## 2021-01-09 DIAGNOSIS — D509 Iron deficiency anemia, unspecified: Secondary | ICD-10-CM | POA: Diagnosis not present

## 2021-01-09 DIAGNOSIS — Z992 Dependence on renal dialysis: Secondary | ICD-10-CM | POA: Diagnosis not present

## 2021-01-09 DIAGNOSIS — N2581 Secondary hyperparathyroidism of renal origin: Secondary | ICD-10-CM | POA: Diagnosis not present

## 2021-01-09 DIAGNOSIS — N2589 Other disorders resulting from impaired renal tubular function: Secondary | ICD-10-CM | POA: Diagnosis not present

## 2021-01-10 DIAGNOSIS — N186 End stage renal disease: Secondary | ICD-10-CM | POA: Diagnosis not present

## 2021-01-10 DIAGNOSIS — D631 Anemia in chronic kidney disease: Secondary | ICD-10-CM | POA: Diagnosis not present

## 2021-01-10 DIAGNOSIS — Z992 Dependence on renal dialysis: Secondary | ICD-10-CM | POA: Diagnosis not present

## 2021-01-10 DIAGNOSIS — N2589 Other disorders resulting from impaired renal tubular function: Secondary | ICD-10-CM | POA: Diagnosis not present

## 2021-01-10 DIAGNOSIS — N2581 Secondary hyperparathyroidism of renal origin: Secondary | ICD-10-CM | POA: Diagnosis not present

## 2021-01-10 DIAGNOSIS — D509 Iron deficiency anemia, unspecified: Secondary | ICD-10-CM | POA: Diagnosis not present

## 2021-01-12 DIAGNOSIS — N186 End stage renal disease: Secondary | ICD-10-CM | POA: Diagnosis not present

## 2021-01-12 DIAGNOSIS — N2581 Secondary hyperparathyroidism of renal origin: Secondary | ICD-10-CM | POA: Diagnosis not present

## 2021-01-12 DIAGNOSIS — N2589 Other disorders resulting from impaired renal tubular function: Secondary | ICD-10-CM | POA: Diagnosis not present

## 2021-01-12 DIAGNOSIS — Z992 Dependence on renal dialysis: Secondary | ICD-10-CM | POA: Diagnosis not present

## 2021-01-12 DIAGNOSIS — D509 Iron deficiency anemia, unspecified: Secondary | ICD-10-CM | POA: Diagnosis not present

## 2021-01-12 DIAGNOSIS — D631 Anemia in chronic kidney disease: Secondary | ICD-10-CM | POA: Diagnosis not present

## 2021-01-13 DIAGNOSIS — N186 End stage renal disease: Secondary | ICD-10-CM | POA: Diagnosis not present

## 2021-01-13 DIAGNOSIS — D509 Iron deficiency anemia, unspecified: Secondary | ICD-10-CM | POA: Diagnosis not present

## 2021-01-13 DIAGNOSIS — E440 Moderate protein-calorie malnutrition: Secondary | ICD-10-CM | POA: Diagnosis not present

## 2021-01-13 DIAGNOSIS — I120 Hypertensive chronic kidney disease with stage 5 chronic kidney disease or end stage renal disease: Secondary | ICD-10-CM | POA: Diagnosis not present

## 2021-01-13 DIAGNOSIS — N2581 Secondary hyperparathyroidism of renal origin: Secondary | ICD-10-CM | POA: Diagnosis not present

## 2021-01-13 DIAGNOSIS — D631 Anemia in chronic kidney disease: Secondary | ICD-10-CM | POA: Diagnosis not present

## 2021-01-13 DIAGNOSIS — Z992 Dependence on renal dialysis: Secondary | ICD-10-CM | POA: Diagnosis not present

## 2021-01-13 DIAGNOSIS — Z79899 Other long term (current) drug therapy: Secondary | ICD-10-CM | POA: Diagnosis not present

## 2021-01-13 DIAGNOSIS — N028 Recurrent and persistent hematuria with other morphologic changes: Secondary | ICD-10-CM | POA: Diagnosis not present

## 2021-01-16 DIAGNOSIS — D509 Iron deficiency anemia, unspecified: Secondary | ICD-10-CM | POA: Diagnosis not present

## 2021-01-16 DIAGNOSIS — N186 End stage renal disease: Secondary | ICD-10-CM | POA: Diagnosis not present

## 2021-01-16 DIAGNOSIS — Z992 Dependence on renal dialysis: Secondary | ICD-10-CM | POA: Diagnosis not present

## 2021-01-16 DIAGNOSIS — N2581 Secondary hyperparathyroidism of renal origin: Secondary | ICD-10-CM | POA: Diagnosis not present

## 2021-01-16 DIAGNOSIS — E440 Moderate protein-calorie malnutrition: Secondary | ICD-10-CM | POA: Diagnosis not present

## 2021-01-16 DIAGNOSIS — D631 Anemia in chronic kidney disease: Secondary | ICD-10-CM | POA: Diagnosis not present

## 2021-01-17 DIAGNOSIS — Z992 Dependence on renal dialysis: Secondary | ICD-10-CM | POA: Diagnosis not present

## 2021-01-17 DIAGNOSIS — D631 Anemia in chronic kidney disease: Secondary | ICD-10-CM | POA: Diagnosis not present

## 2021-01-17 DIAGNOSIS — E440 Moderate protein-calorie malnutrition: Secondary | ICD-10-CM | POA: Diagnosis not present

## 2021-01-17 DIAGNOSIS — N186 End stage renal disease: Secondary | ICD-10-CM | POA: Diagnosis not present

## 2021-01-17 DIAGNOSIS — D509 Iron deficiency anemia, unspecified: Secondary | ICD-10-CM | POA: Diagnosis not present

## 2021-01-17 DIAGNOSIS — N2581 Secondary hyperparathyroidism of renal origin: Secondary | ICD-10-CM | POA: Diagnosis not present

## 2021-01-18 DIAGNOSIS — E440 Moderate protein-calorie malnutrition: Secondary | ICD-10-CM | POA: Diagnosis not present

## 2021-01-18 DIAGNOSIS — N2581 Secondary hyperparathyroidism of renal origin: Secondary | ICD-10-CM | POA: Diagnosis not present

## 2021-01-18 DIAGNOSIS — Z992 Dependence on renal dialysis: Secondary | ICD-10-CM | POA: Diagnosis not present

## 2021-01-18 DIAGNOSIS — D631 Anemia in chronic kidney disease: Secondary | ICD-10-CM | POA: Diagnosis not present

## 2021-01-18 DIAGNOSIS — D509 Iron deficiency anemia, unspecified: Secondary | ICD-10-CM | POA: Diagnosis not present

## 2021-01-18 DIAGNOSIS — M1040 Other secondary gout, unspecified site: Secondary | ICD-10-CM | POA: Diagnosis not present

## 2021-01-18 DIAGNOSIS — N186 End stage renal disease: Secondary | ICD-10-CM | POA: Diagnosis not present

## 2021-01-19 DIAGNOSIS — N2581 Secondary hyperparathyroidism of renal origin: Secondary | ICD-10-CM | POA: Diagnosis not present

## 2021-01-19 DIAGNOSIS — E440 Moderate protein-calorie malnutrition: Secondary | ICD-10-CM | POA: Diagnosis not present

## 2021-01-19 DIAGNOSIS — N186 End stage renal disease: Secondary | ICD-10-CM | POA: Diagnosis not present

## 2021-01-19 DIAGNOSIS — Z992 Dependence on renal dialysis: Secondary | ICD-10-CM | POA: Diagnosis not present

## 2021-01-19 DIAGNOSIS — D509 Iron deficiency anemia, unspecified: Secondary | ICD-10-CM | POA: Diagnosis not present

## 2021-01-19 DIAGNOSIS — D631 Anemia in chronic kidney disease: Secondary | ICD-10-CM | POA: Diagnosis not present

## 2021-01-20 DIAGNOSIS — N2581 Secondary hyperparathyroidism of renal origin: Secondary | ICD-10-CM | POA: Diagnosis not present

## 2021-01-20 DIAGNOSIS — Z992 Dependence on renal dialysis: Secondary | ICD-10-CM | POA: Diagnosis not present

## 2021-01-20 DIAGNOSIS — D631 Anemia in chronic kidney disease: Secondary | ICD-10-CM | POA: Diagnosis not present

## 2021-01-20 DIAGNOSIS — D509 Iron deficiency anemia, unspecified: Secondary | ICD-10-CM | POA: Diagnosis not present

## 2021-01-20 DIAGNOSIS — N186 End stage renal disease: Secondary | ICD-10-CM | POA: Diagnosis not present

## 2021-01-20 DIAGNOSIS — E440 Moderate protein-calorie malnutrition: Secondary | ICD-10-CM | POA: Diagnosis not present

## 2021-01-23 DIAGNOSIS — D631 Anemia in chronic kidney disease: Secondary | ICD-10-CM | POA: Diagnosis not present

## 2021-01-23 DIAGNOSIS — D509 Iron deficiency anemia, unspecified: Secondary | ICD-10-CM | POA: Diagnosis not present

## 2021-01-23 DIAGNOSIS — N2581 Secondary hyperparathyroidism of renal origin: Secondary | ICD-10-CM | POA: Diagnosis not present

## 2021-01-23 DIAGNOSIS — E440 Moderate protein-calorie malnutrition: Secondary | ICD-10-CM | POA: Diagnosis not present

## 2021-01-23 DIAGNOSIS — Z992 Dependence on renal dialysis: Secondary | ICD-10-CM | POA: Diagnosis not present

## 2021-01-23 DIAGNOSIS — N186 End stage renal disease: Secondary | ICD-10-CM | POA: Diagnosis not present

## 2021-01-24 DIAGNOSIS — Z992 Dependence on renal dialysis: Secondary | ICD-10-CM | POA: Diagnosis not present

## 2021-01-24 DIAGNOSIS — E440 Moderate protein-calorie malnutrition: Secondary | ICD-10-CM | POA: Diagnosis not present

## 2021-01-24 DIAGNOSIS — N2581 Secondary hyperparathyroidism of renal origin: Secondary | ICD-10-CM | POA: Diagnosis not present

## 2021-01-24 DIAGNOSIS — N186 End stage renal disease: Secondary | ICD-10-CM | POA: Diagnosis not present

## 2021-01-24 DIAGNOSIS — D631 Anemia in chronic kidney disease: Secondary | ICD-10-CM | POA: Diagnosis not present

## 2021-01-24 DIAGNOSIS — D509 Iron deficiency anemia, unspecified: Secondary | ICD-10-CM | POA: Diagnosis not present

## 2021-01-25 DIAGNOSIS — E440 Moderate protein-calorie malnutrition: Secondary | ICD-10-CM | POA: Diagnosis not present

## 2021-01-25 DIAGNOSIS — D631 Anemia in chronic kidney disease: Secondary | ICD-10-CM | POA: Diagnosis not present

## 2021-01-25 DIAGNOSIS — N2581 Secondary hyperparathyroidism of renal origin: Secondary | ICD-10-CM | POA: Diagnosis not present

## 2021-01-25 DIAGNOSIS — Z992 Dependence on renal dialysis: Secondary | ICD-10-CM | POA: Diagnosis not present

## 2021-01-25 DIAGNOSIS — D509 Iron deficiency anemia, unspecified: Secondary | ICD-10-CM | POA: Diagnosis not present

## 2021-01-25 DIAGNOSIS — N186 End stage renal disease: Secondary | ICD-10-CM | POA: Diagnosis not present

## 2021-01-26 DIAGNOSIS — N186 End stage renal disease: Secondary | ICD-10-CM | POA: Diagnosis not present

## 2021-01-26 DIAGNOSIS — D631 Anemia in chronic kidney disease: Secondary | ICD-10-CM | POA: Diagnosis not present

## 2021-01-26 DIAGNOSIS — E440 Moderate protein-calorie malnutrition: Secondary | ICD-10-CM | POA: Diagnosis not present

## 2021-01-26 DIAGNOSIS — Z992 Dependence on renal dialysis: Secondary | ICD-10-CM | POA: Diagnosis not present

## 2021-01-26 DIAGNOSIS — D509 Iron deficiency anemia, unspecified: Secondary | ICD-10-CM | POA: Diagnosis not present

## 2021-01-26 DIAGNOSIS — N2581 Secondary hyperparathyroidism of renal origin: Secondary | ICD-10-CM | POA: Diagnosis not present

## 2021-01-27 DIAGNOSIS — D509 Iron deficiency anemia, unspecified: Secondary | ICD-10-CM | POA: Diagnosis not present

## 2021-01-27 DIAGNOSIS — N2581 Secondary hyperparathyroidism of renal origin: Secondary | ICD-10-CM | POA: Diagnosis not present

## 2021-01-27 DIAGNOSIS — Z992 Dependence on renal dialysis: Secondary | ICD-10-CM | POA: Diagnosis not present

## 2021-01-27 DIAGNOSIS — D631 Anemia in chronic kidney disease: Secondary | ICD-10-CM | POA: Diagnosis not present

## 2021-01-27 DIAGNOSIS — N186 End stage renal disease: Secondary | ICD-10-CM | POA: Diagnosis not present

## 2021-01-27 DIAGNOSIS — E440 Moderate protein-calorie malnutrition: Secondary | ICD-10-CM | POA: Diagnosis not present

## 2021-01-30 DIAGNOSIS — N2581 Secondary hyperparathyroidism of renal origin: Secondary | ICD-10-CM | POA: Diagnosis not present

## 2021-01-30 DIAGNOSIS — D509 Iron deficiency anemia, unspecified: Secondary | ICD-10-CM | POA: Diagnosis not present

## 2021-01-30 DIAGNOSIS — N5201 Erectile dysfunction due to arterial insufficiency: Secondary | ICD-10-CM | POA: Diagnosis not present

## 2021-01-30 DIAGNOSIS — Z992 Dependence on renal dialysis: Secondary | ICD-10-CM | POA: Diagnosis not present

## 2021-01-30 DIAGNOSIS — D631 Anemia in chronic kidney disease: Secondary | ICD-10-CM | POA: Diagnosis not present

## 2021-01-30 DIAGNOSIS — E291 Testicular hypofunction: Secondary | ICD-10-CM | POA: Diagnosis not present

## 2021-01-30 DIAGNOSIS — N186 End stage renal disease: Secondary | ICD-10-CM | POA: Diagnosis not present

## 2021-01-30 DIAGNOSIS — E440 Moderate protein-calorie malnutrition: Secondary | ICD-10-CM | POA: Diagnosis not present

## 2021-01-31 DIAGNOSIS — Z992 Dependence on renal dialysis: Secondary | ICD-10-CM | POA: Diagnosis not present

## 2021-01-31 DIAGNOSIS — N186 End stage renal disease: Secondary | ICD-10-CM | POA: Diagnosis not present

## 2021-01-31 DIAGNOSIS — E440 Moderate protein-calorie malnutrition: Secondary | ICD-10-CM | POA: Diagnosis not present

## 2021-01-31 DIAGNOSIS — D631 Anemia in chronic kidney disease: Secondary | ICD-10-CM | POA: Diagnosis not present

## 2021-01-31 DIAGNOSIS — N2581 Secondary hyperparathyroidism of renal origin: Secondary | ICD-10-CM | POA: Diagnosis not present

## 2021-01-31 DIAGNOSIS — D509 Iron deficiency anemia, unspecified: Secondary | ICD-10-CM | POA: Diagnosis not present

## 2021-02-02 DIAGNOSIS — N186 End stage renal disease: Secondary | ICD-10-CM | POA: Diagnosis not present

## 2021-02-02 DIAGNOSIS — D631 Anemia in chronic kidney disease: Secondary | ICD-10-CM | POA: Diagnosis not present

## 2021-02-02 DIAGNOSIS — D509 Iron deficiency anemia, unspecified: Secondary | ICD-10-CM | POA: Diagnosis not present

## 2021-02-02 DIAGNOSIS — Z992 Dependence on renal dialysis: Secondary | ICD-10-CM | POA: Diagnosis not present

## 2021-02-02 DIAGNOSIS — E440 Moderate protein-calorie malnutrition: Secondary | ICD-10-CM | POA: Diagnosis not present

## 2021-02-02 DIAGNOSIS — N2581 Secondary hyperparathyroidism of renal origin: Secondary | ICD-10-CM | POA: Diagnosis not present

## 2021-02-03 DIAGNOSIS — Z992 Dependence on renal dialysis: Secondary | ICD-10-CM | POA: Diagnosis not present

## 2021-02-03 DIAGNOSIS — D509 Iron deficiency anemia, unspecified: Secondary | ICD-10-CM | POA: Diagnosis not present

## 2021-02-03 DIAGNOSIS — N2581 Secondary hyperparathyroidism of renal origin: Secondary | ICD-10-CM | POA: Diagnosis not present

## 2021-02-03 DIAGNOSIS — D631 Anemia in chronic kidney disease: Secondary | ICD-10-CM | POA: Diagnosis not present

## 2021-02-03 DIAGNOSIS — N186 End stage renal disease: Secondary | ICD-10-CM | POA: Diagnosis not present

## 2021-02-03 DIAGNOSIS — E440 Moderate protein-calorie malnutrition: Secondary | ICD-10-CM | POA: Diagnosis not present

## 2021-02-06 DIAGNOSIS — N2581 Secondary hyperparathyroidism of renal origin: Secondary | ICD-10-CM | POA: Diagnosis not present

## 2021-02-06 DIAGNOSIS — Z992 Dependence on renal dialysis: Secondary | ICD-10-CM | POA: Diagnosis not present

## 2021-02-06 DIAGNOSIS — D509 Iron deficiency anemia, unspecified: Secondary | ICD-10-CM | POA: Diagnosis not present

## 2021-02-06 DIAGNOSIS — N186 End stage renal disease: Secondary | ICD-10-CM | POA: Diagnosis not present

## 2021-02-06 DIAGNOSIS — E440 Moderate protein-calorie malnutrition: Secondary | ICD-10-CM | POA: Diagnosis not present

## 2021-02-06 DIAGNOSIS — D631 Anemia in chronic kidney disease: Secondary | ICD-10-CM | POA: Diagnosis not present

## 2021-02-07 DIAGNOSIS — N186 End stage renal disease: Secondary | ICD-10-CM | POA: Diagnosis not present

## 2021-02-07 DIAGNOSIS — D631 Anemia in chronic kidney disease: Secondary | ICD-10-CM | POA: Diagnosis not present

## 2021-02-07 DIAGNOSIS — N2581 Secondary hyperparathyroidism of renal origin: Secondary | ICD-10-CM | POA: Diagnosis not present

## 2021-02-07 DIAGNOSIS — Z992 Dependence on renal dialysis: Secondary | ICD-10-CM | POA: Diagnosis not present

## 2021-02-07 DIAGNOSIS — E440 Moderate protein-calorie malnutrition: Secondary | ICD-10-CM | POA: Diagnosis not present

## 2021-02-07 DIAGNOSIS — D509 Iron deficiency anemia, unspecified: Secondary | ICD-10-CM | POA: Diagnosis not present

## 2021-02-09 DIAGNOSIS — D509 Iron deficiency anemia, unspecified: Secondary | ICD-10-CM | POA: Diagnosis not present

## 2021-02-09 DIAGNOSIS — E440 Moderate protein-calorie malnutrition: Secondary | ICD-10-CM | POA: Diagnosis not present

## 2021-02-09 DIAGNOSIS — N2581 Secondary hyperparathyroidism of renal origin: Secondary | ICD-10-CM | POA: Diagnosis not present

## 2021-02-09 DIAGNOSIS — N186 End stage renal disease: Secondary | ICD-10-CM | POA: Diagnosis not present

## 2021-02-09 DIAGNOSIS — Z992 Dependence on renal dialysis: Secondary | ICD-10-CM | POA: Diagnosis not present

## 2021-02-09 DIAGNOSIS — D631 Anemia in chronic kidney disease: Secondary | ICD-10-CM | POA: Diagnosis not present

## 2021-02-10 DIAGNOSIS — D631 Anemia in chronic kidney disease: Secondary | ICD-10-CM | POA: Diagnosis not present

## 2021-02-10 DIAGNOSIS — N2581 Secondary hyperparathyroidism of renal origin: Secondary | ICD-10-CM | POA: Diagnosis not present

## 2021-02-10 DIAGNOSIS — D509 Iron deficiency anemia, unspecified: Secondary | ICD-10-CM | POA: Diagnosis not present

## 2021-02-10 DIAGNOSIS — E440 Moderate protein-calorie malnutrition: Secondary | ICD-10-CM | POA: Diagnosis not present

## 2021-02-10 DIAGNOSIS — N186 End stage renal disease: Secondary | ICD-10-CM | POA: Diagnosis not present

## 2021-02-10 DIAGNOSIS — Z992 Dependence on renal dialysis: Secondary | ICD-10-CM | POA: Diagnosis not present

## 2021-02-12 DIAGNOSIS — Z992 Dependence on renal dialysis: Secondary | ICD-10-CM | POA: Diagnosis not present

## 2021-02-12 DIAGNOSIS — N028 Recurrent and persistent hematuria with other morphologic changes: Secondary | ICD-10-CM | POA: Diagnosis not present

## 2021-02-12 DIAGNOSIS — N186 End stage renal disease: Secondary | ICD-10-CM | POA: Diagnosis not present

## 2021-02-13 DIAGNOSIS — D509 Iron deficiency anemia, unspecified: Secondary | ICD-10-CM | POA: Diagnosis not present

## 2021-02-13 DIAGNOSIS — Z992 Dependence on renal dialysis: Secondary | ICD-10-CM | POA: Diagnosis not present

## 2021-02-13 DIAGNOSIS — D631 Anemia in chronic kidney disease: Secondary | ICD-10-CM | POA: Diagnosis not present

## 2021-02-13 DIAGNOSIS — I120 Hypertensive chronic kidney disease with stage 5 chronic kidney disease or end stage renal disease: Secondary | ICD-10-CM | POA: Diagnosis not present

## 2021-02-13 DIAGNOSIS — N186 End stage renal disease: Secondary | ICD-10-CM | POA: Diagnosis not present

## 2021-02-13 DIAGNOSIS — Z79899 Other long term (current) drug therapy: Secondary | ICD-10-CM | POA: Diagnosis not present

## 2021-02-13 DIAGNOSIS — N2581 Secondary hyperparathyroidism of renal origin: Secondary | ICD-10-CM | POA: Diagnosis not present

## 2021-02-13 DIAGNOSIS — E440 Moderate protein-calorie malnutrition: Secondary | ICD-10-CM | POA: Diagnosis not present

## 2021-02-14 DIAGNOSIS — D509 Iron deficiency anemia, unspecified: Secondary | ICD-10-CM | POA: Diagnosis not present

## 2021-02-14 DIAGNOSIS — N186 End stage renal disease: Secondary | ICD-10-CM | POA: Diagnosis not present

## 2021-02-14 DIAGNOSIS — E440 Moderate protein-calorie malnutrition: Secondary | ICD-10-CM | POA: Diagnosis not present

## 2021-02-14 DIAGNOSIS — D631 Anemia in chronic kidney disease: Secondary | ICD-10-CM | POA: Diagnosis not present

## 2021-02-14 DIAGNOSIS — Z992 Dependence on renal dialysis: Secondary | ICD-10-CM | POA: Diagnosis not present

## 2021-02-14 DIAGNOSIS — N2581 Secondary hyperparathyroidism of renal origin: Secondary | ICD-10-CM | POA: Diagnosis not present

## 2021-02-15 DIAGNOSIS — D631 Anemia in chronic kidney disease: Secondary | ICD-10-CM | POA: Diagnosis not present

## 2021-02-15 DIAGNOSIS — D509 Iron deficiency anemia, unspecified: Secondary | ICD-10-CM | POA: Diagnosis not present

## 2021-02-15 DIAGNOSIS — N2581 Secondary hyperparathyroidism of renal origin: Secondary | ICD-10-CM | POA: Diagnosis not present

## 2021-02-15 DIAGNOSIS — E440 Moderate protein-calorie malnutrition: Secondary | ICD-10-CM | POA: Diagnosis not present

## 2021-02-15 DIAGNOSIS — N186 End stage renal disease: Secondary | ICD-10-CM | POA: Diagnosis not present

## 2021-02-15 DIAGNOSIS — Z992 Dependence on renal dialysis: Secondary | ICD-10-CM | POA: Diagnosis not present

## 2021-02-15 DIAGNOSIS — M1040 Other secondary gout, unspecified site: Secondary | ICD-10-CM | POA: Diagnosis not present

## 2021-02-16 DIAGNOSIS — D509 Iron deficiency anemia, unspecified: Secondary | ICD-10-CM | POA: Diagnosis not present

## 2021-02-16 DIAGNOSIS — N2581 Secondary hyperparathyroidism of renal origin: Secondary | ICD-10-CM | POA: Diagnosis not present

## 2021-02-16 DIAGNOSIS — E440 Moderate protein-calorie malnutrition: Secondary | ICD-10-CM | POA: Diagnosis not present

## 2021-02-16 DIAGNOSIS — D631 Anemia in chronic kidney disease: Secondary | ICD-10-CM | POA: Diagnosis not present

## 2021-02-16 DIAGNOSIS — N186 End stage renal disease: Secondary | ICD-10-CM | POA: Diagnosis not present

## 2021-02-16 DIAGNOSIS — Z992 Dependence on renal dialysis: Secondary | ICD-10-CM | POA: Diagnosis not present

## 2021-02-17 DIAGNOSIS — D631 Anemia in chronic kidney disease: Secondary | ICD-10-CM | POA: Diagnosis not present

## 2021-02-17 DIAGNOSIS — N2581 Secondary hyperparathyroidism of renal origin: Secondary | ICD-10-CM | POA: Diagnosis not present

## 2021-02-17 DIAGNOSIS — E440 Moderate protein-calorie malnutrition: Secondary | ICD-10-CM | POA: Diagnosis not present

## 2021-02-17 DIAGNOSIS — N186 End stage renal disease: Secondary | ICD-10-CM | POA: Diagnosis not present

## 2021-02-17 DIAGNOSIS — Z992 Dependence on renal dialysis: Secondary | ICD-10-CM | POA: Diagnosis not present

## 2021-02-17 DIAGNOSIS — D509 Iron deficiency anemia, unspecified: Secondary | ICD-10-CM | POA: Diagnosis not present

## 2021-02-20 DIAGNOSIS — D631 Anemia in chronic kidney disease: Secondary | ICD-10-CM | POA: Diagnosis not present

## 2021-02-20 DIAGNOSIS — N2581 Secondary hyperparathyroidism of renal origin: Secondary | ICD-10-CM | POA: Diagnosis not present

## 2021-02-20 DIAGNOSIS — Z992 Dependence on renal dialysis: Secondary | ICD-10-CM | POA: Diagnosis not present

## 2021-02-20 DIAGNOSIS — E440 Moderate protein-calorie malnutrition: Secondary | ICD-10-CM | POA: Diagnosis not present

## 2021-02-20 DIAGNOSIS — D509 Iron deficiency anemia, unspecified: Secondary | ICD-10-CM | POA: Diagnosis not present

## 2021-02-20 DIAGNOSIS — N186 End stage renal disease: Secondary | ICD-10-CM | POA: Diagnosis not present

## 2021-02-21 DIAGNOSIS — D631 Anemia in chronic kidney disease: Secondary | ICD-10-CM | POA: Diagnosis not present

## 2021-02-21 DIAGNOSIS — Z992 Dependence on renal dialysis: Secondary | ICD-10-CM | POA: Diagnosis not present

## 2021-02-21 DIAGNOSIS — E440 Moderate protein-calorie malnutrition: Secondary | ICD-10-CM | POA: Diagnosis not present

## 2021-02-21 DIAGNOSIS — D509 Iron deficiency anemia, unspecified: Secondary | ICD-10-CM | POA: Diagnosis not present

## 2021-02-21 DIAGNOSIS — N186 End stage renal disease: Secondary | ICD-10-CM | POA: Diagnosis not present

## 2021-02-21 DIAGNOSIS — N2581 Secondary hyperparathyroidism of renal origin: Secondary | ICD-10-CM | POA: Diagnosis not present

## 2021-02-23 DIAGNOSIS — N2581 Secondary hyperparathyroidism of renal origin: Secondary | ICD-10-CM | POA: Diagnosis not present

## 2021-02-23 DIAGNOSIS — D509 Iron deficiency anemia, unspecified: Secondary | ICD-10-CM | POA: Diagnosis not present

## 2021-02-23 DIAGNOSIS — N186 End stage renal disease: Secondary | ICD-10-CM | POA: Diagnosis not present

## 2021-02-23 DIAGNOSIS — E440 Moderate protein-calorie malnutrition: Secondary | ICD-10-CM | POA: Diagnosis not present

## 2021-02-23 DIAGNOSIS — Z992 Dependence on renal dialysis: Secondary | ICD-10-CM | POA: Diagnosis not present

## 2021-02-23 DIAGNOSIS — D631 Anemia in chronic kidney disease: Secondary | ICD-10-CM | POA: Diagnosis not present

## 2021-02-24 DIAGNOSIS — Z992 Dependence on renal dialysis: Secondary | ICD-10-CM | POA: Diagnosis not present

## 2021-02-24 DIAGNOSIS — N2581 Secondary hyperparathyroidism of renal origin: Secondary | ICD-10-CM | POA: Diagnosis not present

## 2021-02-24 DIAGNOSIS — D631 Anemia in chronic kidney disease: Secondary | ICD-10-CM | POA: Diagnosis not present

## 2021-02-24 DIAGNOSIS — N186 End stage renal disease: Secondary | ICD-10-CM | POA: Diagnosis not present

## 2021-02-24 DIAGNOSIS — E440 Moderate protein-calorie malnutrition: Secondary | ICD-10-CM | POA: Diagnosis not present

## 2021-02-24 DIAGNOSIS — D509 Iron deficiency anemia, unspecified: Secondary | ICD-10-CM | POA: Diagnosis not present

## 2021-02-27 DIAGNOSIS — D509 Iron deficiency anemia, unspecified: Secondary | ICD-10-CM | POA: Diagnosis not present

## 2021-02-27 DIAGNOSIS — Z992 Dependence on renal dialysis: Secondary | ICD-10-CM | POA: Diagnosis not present

## 2021-02-27 DIAGNOSIS — D631 Anemia in chronic kidney disease: Secondary | ICD-10-CM | POA: Diagnosis not present

## 2021-02-27 DIAGNOSIS — E440 Moderate protein-calorie malnutrition: Secondary | ICD-10-CM | POA: Diagnosis not present

## 2021-02-27 DIAGNOSIS — N186 End stage renal disease: Secondary | ICD-10-CM | POA: Diagnosis not present

## 2021-02-27 DIAGNOSIS — N2581 Secondary hyperparathyroidism of renal origin: Secondary | ICD-10-CM | POA: Diagnosis not present

## 2021-02-28 DIAGNOSIS — E440 Moderate protein-calorie malnutrition: Secondary | ICD-10-CM | POA: Diagnosis not present

## 2021-02-28 DIAGNOSIS — N2581 Secondary hyperparathyroidism of renal origin: Secondary | ICD-10-CM | POA: Diagnosis not present

## 2021-02-28 DIAGNOSIS — N186 End stage renal disease: Secondary | ICD-10-CM | POA: Diagnosis not present

## 2021-02-28 DIAGNOSIS — D631 Anemia in chronic kidney disease: Secondary | ICD-10-CM | POA: Diagnosis not present

## 2021-02-28 DIAGNOSIS — D509 Iron deficiency anemia, unspecified: Secondary | ICD-10-CM | POA: Diagnosis not present

## 2021-02-28 DIAGNOSIS — Z992 Dependence on renal dialysis: Secondary | ICD-10-CM | POA: Diagnosis not present

## 2021-03-01 DIAGNOSIS — Z992 Dependence on renal dialysis: Secondary | ICD-10-CM | POA: Diagnosis not present

## 2021-03-01 DIAGNOSIS — D509 Iron deficiency anemia, unspecified: Secondary | ICD-10-CM | POA: Diagnosis not present

## 2021-03-01 DIAGNOSIS — N186 End stage renal disease: Secondary | ICD-10-CM | POA: Diagnosis not present

## 2021-03-01 DIAGNOSIS — N2581 Secondary hyperparathyroidism of renal origin: Secondary | ICD-10-CM | POA: Diagnosis not present

## 2021-03-01 DIAGNOSIS — D631 Anemia in chronic kidney disease: Secondary | ICD-10-CM | POA: Diagnosis not present

## 2021-03-01 DIAGNOSIS — E440 Moderate protein-calorie malnutrition: Secondary | ICD-10-CM | POA: Diagnosis not present

## 2021-03-02 DIAGNOSIS — D509 Iron deficiency anemia, unspecified: Secondary | ICD-10-CM | POA: Diagnosis not present

## 2021-03-02 DIAGNOSIS — Z992 Dependence on renal dialysis: Secondary | ICD-10-CM | POA: Diagnosis not present

## 2021-03-02 DIAGNOSIS — E440 Moderate protein-calorie malnutrition: Secondary | ICD-10-CM | POA: Diagnosis not present

## 2021-03-02 DIAGNOSIS — N2581 Secondary hyperparathyroidism of renal origin: Secondary | ICD-10-CM | POA: Diagnosis not present

## 2021-03-02 DIAGNOSIS — D631 Anemia in chronic kidney disease: Secondary | ICD-10-CM | POA: Diagnosis not present

## 2021-03-02 DIAGNOSIS — N186 End stage renal disease: Secondary | ICD-10-CM | POA: Diagnosis not present

## 2021-03-03 DIAGNOSIS — N2581 Secondary hyperparathyroidism of renal origin: Secondary | ICD-10-CM | POA: Diagnosis not present

## 2021-03-03 DIAGNOSIS — D631 Anemia in chronic kidney disease: Secondary | ICD-10-CM | POA: Diagnosis not present

## 2021-03-03 DIAGNOSIS — D509 Iron deficiency anemia, unspecified: Secondary | ICD-10-CM | POA: Diagnosis not present

## 2021-03-03 DIAGNOSIS — Z992 Dependence on renal dialysis: Secondary | ICD-10-CM | POA: Diagnosis not present

## 2021-03-03 DIAGNOSIS — E440 Moderate protein-calorie malnutrition: Secondary | ICD-10-CM | POA: Diagnosis not present

## 2021-03-03 DIAGNOSIS — N186 End stage renal disease: Secondary | ICD-10-CM | POA: Diagnosis not present

## 2021-03-06 DIAGNOSIS — D631 Anemia in chronic kidney disease: Secondary | ICD-10-CM | POA: Diagnosis not present

## 2021-03-06 DIAGNOSIS — N2581 Secondary hyperparathyroidism of renal origin: Secondary | ICD-10-CM | POA: Diagnosis not present

## 2021-03-06 DIAGNOSIS — Z992 Dependence on renal dialysis: Secondary | ICD-10-CM | POA: Diagnosis not present

## 2021-03-06 DIAGNOSIS — D509 Iron deficiency anemia, unspecified: Secondary | ICD-10-CM | POA: Diagnosis not present

## 2021-03-06 DIAGNOSIS — E440 Moderate protein-calorie malnutrition: Secondary | ICD-10-CM | POA: Diagnosis not present

## 2021-03-06 DIAGNOSIS — N186 End stage renal disease: Secondary | ICD-10-CM | POA: Diagnosis not present

## 2021-03-07 DIAGNOSIS — E440 Moderate protein-calorie malnutrition: Secondary | ICD-10-CM | POA: Diagnosis not present

## 2021-03-07 DIAGNOSIS — Z992 Dependence on renal dialysis: Secondary | ICD-10-CM | POA: Diagnosis not present

## 2021-03-07 DIAGNOSIS — N2581 Secondary hyperparathyroidism of renal origin: Secondary | ICD-10-CM | POA: Diagnosis not present

## 2021-03-07 DIAGNOSIS — D631 Anemia in chronic kidney disease: Secondary | ICD-10-CM | POA: Diagnosis not present

## 2021-03-07 DIAGNOSIS — D509 Iron deficiency anemia, unspecified: Secondary | ICD-10-CM | POA: Diagnosis not present

## 2021-03-07 DIAGNOSIS — N186 End stage renal disease: Secondary | ICD-10-CM | POA: Diagnosis not present

## 2021-03-08 DIAGNOSIS — Z992 Dependence on renal dialysis: Secondary | ICD-10-CM | POA: Diagnosis not present

## 2021-03-08 DIAGNOSIS — H18462 Peripheral corneal degeneration, left eye: Secondary | ICD-10-CM | POA: Diagnosis not present

## 2021-03-08 DIAGNOSIS — N2581 Secondary hyperparathyroidism of renal origin: Secondary | ICD-10-CM | POA: Diagnosis not present

## 2021-03-08 DIAGNOSIS — H0288B Meibomian gland dysfunction left eye, upper and lower eyelids: Secondary | ICD-10-CM | POA: Diagnosis not present

## 2021-03-08 DIAGNOSIS — H52223 Regular astigmatism, bilateral: Secondary | ICD-10-CM | POA: Diagnosis not present

## 2021-03-08 DIAGNOSIS — H52212 Irregular astigmatism, left eye: Secondary | ICD-10-CM | POA: Diagnosis not present

## 2021-03-08 DIAGNOSIS — D509 Iron deficiency anemia, unspecified: Secondary | ICD-10-CM | POA: Diagnosis not present

## 2021-03-08 DIAGNOSIS — N186 End stage renal disease: Secondary | ICD-10-CM | POA: Diagnosis not present

## 2021-03-08 DIAGNOSIS — D631 Anemia in chronic kidney disease: Secondary | ICD-10-CM | POA: Diagnosis not present

## 2021-03-08 DIAGNOSIS — H0288A Meibomian gland dysfunction right eye, upper and lower eyelids: Secondary | ICD-10-CM | POA: Diagnosis not present

## 2021-03-08 DIAGNOSIS — E440 Moderate protein-calorie malnutrition: Secondary | ICD-10-CM | POA: Diagnosis not present

## 2021-03-09 DIAGNOSIS — D631 Anemia in chronic kidney disease: Secondary | ICD-10-CM | POA: Diagnosis not present

## 2021-03-09 DIAGNOSIS — Z992 Dependence on renal dialysis: Secondary | ICD-10-CM | POA: Diagnosis not present

## 2021-03-09 DIAGNOSIS — D509 Iron deficiency anemia, unspecified: Secondary | ICD-10-CM | POA: Diagnosis not present

## 2021-03-09 DIAGNOSIS — N186 End stage renal disease: Secondary | ICD-10-CM | POA: Diagnosis not present

## 2021-03-09 DIAGNOSIS — N2581 Secondary hyperparathyroidism of renal origin: Secondary | ICD-10-CM | POA: Diagnosis not present

## 2021-03-09 DIAGNOSIS — E440 Moderate protein-calorie malnutrition: Secondary | ICD-10-CM | POA: Diagnosis not present

## 2021-03-10 DIAGNOSIS — E440 Moderate protein-calorie malnutrition: Secondary | ICD-10-CM | POA: Diagnosis not present

## 2021-03-10 DIAGNOSIS — D509 Iron deficiency anemia, unspecified: Secondary | ICD-10-CM | POA: Diagnosis not present

## 2021-03-10 DIAGNOSIS — Z992 Dependence on renal dialysis: Secondary | ICD-10-CM | POA: Diagnosis not present

## 2021-03-10 DIAGNOSIS — N186 End stage renal disease: Secondary | ICD-10-CM | POA: Diagnosis not present

## 2021-03-10 DIAGNOSIS — D631 Anemia in chronic kidney disease: Secondary | ICD-10-CM | POA: Diagnosis not present

## 2021-03-10 DIAGNOSIS — N2581 Secondary hyperparathyroidism of renal origin: Secondary | ICD-10-CM | POA: Diagnosis not present

## 2021-03-13 DIAGNOSIS — D631 Anemia in chronic kidney disease: Secondary | ICD-10-CM | POA: Diagnosis not present

## 2021-03-13 DIAGNOSIS — N2581 Secondary hyperparathyroidism of renal origin: Secondary | ICD-10-CM | POA: Diagnosis not present

## 2021-03-13 DIAGNOSIS — D509 Iron deficiency anemia, unspecified: Secondary | ICD-10-CM | POA: Diagnosis not present

## 2021-03-13 DIAGNOSIS — E440 Moderate protein-calorie malnutrition: Secondary | ICD-10-CM | POA: Diagnosis not present

## 2021-03-13 DIAGNOSIS — N186 End stage renal disease: Secondary | ICD-10-CM | POA: Diagnosis not present

## 2021-03-13 DIAGNOSIS — Z992 Dependence on renal dialysis: Secondary | ICD-10-CM | POA: Diagnosis not present

## 2021-03-14 DIAGNOSIS — Z992 Dependence on renal dialysis: Secondary | ICD-10-CM | POA: Diagnosis not present

## 2021-03-14 DIAGNOSIS — D509 Iron deficiency anemia, unspecified: Secondary | ICD-10-CM | POA: Diagnosis not present

## 2021-03-14 DIAGNOSIS — D631 Anemia in chronic kidney disease: Secondary | ICD-10-CM | POA: Diagnosis not present

## 2021-03-14 DIAGNOSIS — E440 Moderate protein-calorie malnutrition: Secondary | ICD-10-CM | POA: Diagnosis not present

## 2021-03-14 DIAGNOSIS — N186 End stage renal disease: Secondary | ICD-10-CM | POA: Diagnosis not present

## 2021-03-14 DIAGNOSIS — N2581 Secondary hyperparathyroidism of renal origin: Secondary | ICD-10-CM | POA: Diagnosis not present

## 2021-03-15 DIAGNOSIS — Z992 Dependence on renal dialysis: Secondary | ICD-10-CM | POA: Diagnosis not present

## 2021-03-15 DIAGNOSIS — N028 Recurrent and persistent hematuria with other morphologic changes: Secondary | ICD-10-CM | POA: Diagnosis not present

## 2021-03-15 DIAGNOSIS — N186 End stage renal disease: Secondary | ICD-10-CM | POA: Diagnosis not present

## 2021-03-16 DIAGNOSIS — D631 Anemia in chronic kidney disease: Secondary | ICD-10-CM | POA: Diagnosis not present

## 2021-03-16 DIAGNOSIS — I120 Hypertensive chronic kidney disease with stage 5 chronic kidney disease or end stage renal disease: Secondary | ICD-10-CM | POA: Diagnosis not present

## 2021-03-16 DIAGNOSIS — N2589 Other disorders resulting from impaired renal tubular function: Secondary | ICD-10-CM | POA: Diagnosis not present

## 2021-03-16 DIAGNOSIS — D509 Iron deficiency anemia, unspecified: Secondary | ICD-10-CM | POA: Diagnosis not present

## 2021-03-16 DIAGNOSIS — K769 Liver disease, unspecified: Secondary | ICD-10-CM | POA: Diagnosis not present

## 2021-03-16 DIAGNOSIS — N2581 Secondary hyperparathyroidism of renal origin: Secondary | ICD-10-CM | POA: Diagnosis not present

## 2021-03-16 DIAGNOSIS — Z992 Dependence on renal dialysis: Secondary | ICD-10-CM | POA: Diagnosis not present

## 2021-03-16 DIAGNOSIS — N186 End stage renal disease: Secondary | ICD-10-CM | POA: Diagnosis not present

## 2021-03-17 DIAGNOSIS — K769 Liver disease, unspecified: Secondary | ICD-10-CM | POA: Diagnosis not present

## 2021-03-17 DIAGNOSIS — N2581 Secondary hyperparathyroidism of renal origin: Secondary | ICD-10-CM | POA: Diagnosis not present

## 2021-03-17 DIAGNOSIS — Z992 Dependence on renal dialysis: Secondary | ICD-10-CM | POA: Diagnosis not present

## 2021-03-17 DIAGNOSIS — N186 End stage renal disease: Secondary | ICD-10-CM | POA: Diagnosis not present

## 2021-03-17 DIAGNOSIS — N2589 Other disorders resulting from impaired renal tubular function: Secondary | ICD-10-CM | POA: Diagnosis not present

## 2021-03-17 DIAGNOSIS — D509 Iron deficiency anemia, unspecified: Secondary | ICD-10-CM | POA: Diagnosis not present

## 2021-03-20 DIAGNOSIS — D509 Iron deficiency anemia, unspecified: Secondary | ICD-10-CM | POA: Diagnosis not present

## 2021-03-20 DIAGNOSIS — N2589 Other disorders resulting from impaired renal tubular function: Secondary | ICD-10-CM | POA: Diagnosis not present

## 2021-03-20 DIAGNOSIS — N186 End stage renal disease: Secondary | ICD-10-CM | POA: Diagnosis not present

## 2021-03-20 DIAGNOSIS — Z992 Dependence on renal dialysis: Secondary | ICD-10-CM | POA: Diagnosis not present

## 2021-03-20 DIAGNOSIS — K769 Liver disease, unspecified: Secondary | ICD-10-CM | POA: Diagnosis not present

## 2021-03-20 DIAGNOSIS — N2581 Secondary hyperparathyroidism of renal origin: Secondary | ICD-10-CM | POA: Diagnosis not present

## 2021-03-21 ENCOUNTER — Encounter: Payer: Self-pay | Admitting: Family Medicine

## 2021-03-21 ENCOUNTER — Ambulatory Visit (INDEPENDENT_AMBULATORY_CARE_PROVIDER_SITE_OTHER): Payer: Medicare Other | Admitting: Family Medicine

## 2021-03-21 ENCOUNTER — Other Ambulatory Visit: Payer: Self-pay

## 2021-03-21 VITALS — BP 142/90 | HR 70 | Temp 98.3°F | Ht 77.0 in | Wt 323.8 lb

## 2021-03-21 DIAGNOSIS — N2589 Other disorders resulting from impaired renal tubular function: Secondary | ICD-10-CM | POA: Diagnosis not present

## 2021-03-21 DIAGNOSIS — N186 End stage renal disease: Secondary | ICD-10-CM | POA: Diagnosis not present

## 2021-03-21 DIAGNOSIS — R42 Dizziness and giddiness: Secondary | ICD-10-CM

## 2021-03-21 DIAGNOSIS — N2581 Secondary hyperparathyroidism of renal origin: Secondary | ICD-10-CM | POA: Diagnosis not present

## 2021-03-21 DIAGNOSIS — D509 Iron deficiency anemia, unspecified: Secondary | ICD-10-CM | POA: Diagnosis not present

## 2021-03-21 DIAGNOSIS — K769 Liver disease, unspecified: Secondary | ICD-10-CM | POA: Diagnosis not present

## 2021-03-21 DIAGNOSIS — Z992 Dependence on renal dialysis: Secondary | ICD-10-CM | POA: Diagnosis not present

## 2021-03-21 NOTE — Patient Instructions (Addendum)
Start flonase 2 spray per nostril daily  X 1-2 weeks.  Start densenstization exercise.  Have RN send labs attention Dr. Diona Browner... fax [phone]  Call if not improving in next 2 week.  Can use meclizine or Antivert for nausea if recurs.

## 2021-03-21 NOTE — Progress Notes (Signed)
Patient ID: Eric Douglas, male    DOB: Nov 02, 1970, 50 y.o.   MRN: [id number]  This visit was conducted in person.  BP (!) 142/90   Pulse 70   Temp 98.3 F (36.8 C) (Temporal)   Ht 6\' 5"  (1.956 m)   Wt (!) 323 lb 12 oz (146.9 kg)   SpO2 97%   BMI 38.39 kg/m    CC:  Chief Complaint  Patient presents with   Dizziness   Ear Fullness    Right    Subjective:   HPI: Eric Douglas is a 50 y.o. male presenting on 03/21/2021 for Dizziness and Ear Fullness (Right)  He reports  2 month ear fullness.  felt swimmy headed when working... intermittently ... associated with nausea. Muffled in left ear.  Described as room spinning.  No HA, no confusion, no weakness.  He is on dialysis of IgA nephropathy, ESRD... on home dialysis BP Readings from Last 3 Encounters:  03/21/21 (!) 142/90  06/30/20 (!) 147/96  06/10/20 (!) 150/80   Had an episode of presyncope and emesis following dialysis on 6/5.  Violent throwing up.  When opened eyes.. saw room spinning.  No diarrhea, no abdominal pain. No CP, no SOB, no bleeding    no new med changes.  Relevant past medical, surgical, family and social history reviewed and updated as indicated. Interim medical history since our last visit reviewed. Allergies and medications reviewed and updated. Outpatient Medications Prior to Visit  Medication Sig Dispense Refill   allopurinol (ZYLOPRIM) 100 MG tablet Take 100 mg by mouth daily as needed (gout).   2   amLODipine (NORVASC) 10 MG tablet Take 10 mg by mouth daily.      BYSTOLIC 10 MG tablet Take 10 mg by mouth daily.      calcium acetate (PHOSLO) 667 MG tablet Take 1 tablet by mouth 3 (three) times daily with meals.     furosemide (LASIX) 40 MG tablet Take 40 mg by mouth every other day.      gabapentin (NEURONTIN) 100 MG capsule Take 100 mg by mouth 2 (two) times daily.     tamsulosin (FLOMAX) 0.4 MG CAPS capsule TAKE 1 CAPSULE BY MOUTH EVERY DAY 30 capsule 0   testosterone  cypionate (DEPOTESTOSTERONE CYPIONATE) 200 MG/ML injection INJECT 1 ML (200 MG TOTAL) INTO THE MUSCLE EVERY 14 (FOURTEEN) DAYS. MUST SCHEDULE ANNUAL EXAM 2 mL 0   No facility-administered medications prior to visit.     Per HPI unless specifically indicated in ROS section below Review of Systems  Constitutional:  Negative for fatigue and fever.  HENT:  Negative for ear pain.   Eyes:  Negative for pain.  Respiratory:  Negative for cough and shortness of breath.   Cardiovascular:  Negative for chest pain, palpitations and leg swelling.  Gastrointestinal:  Negative for abdominal pain.  Genitourinary:  Negative for dysuria.  Musculoskeletal:  Negative for arthralgias.  Neurological:  Negative for syncope, light-headedness and headaches.  Psychiatric/Behavioral:  Negative for dysphoric mood.   Objective:  BP (!) 142/90   Pulse 70   Temp 98.3 F (36.8 C) (Temporal)   Ht 6\' 5"  (1.956 m)   Wt (!) 323 lb 12 oz (146.9 kg)   SpO2 97%   BMI 38.39 kg/m   Wt Readings from Last 3 Encounters:  03/21/21 (!) 323 lb 12 oz (146.9 kg)  06/30/20 (!) 319 lb (144.7 kg)  06/10/20 (!) 319 lb (144.7 kg)      Physical  Exam Constitutional:      Appearance: He is well-developed.  HENT:     Head: Normocephalic.     Right Ear: Hearing normal. No middle ear effusion.     Left Ear: Hearing normal. A middle ear effusion is present.     Nose: Nose normal.  Neck:     Thyroid: No thyroid mass or thyromegaly.     Vascular: No carotid bruit.     Trachea: Trachea normal.  Cardiovascular:     Rate and Rhythm: Normal rate and regular rhythm.     Pulses: Normal pulses.     Heart sounds: Heart sounds not distant. No murmur heard.   No friction rub. No gallop.     Comments: No peripheral edema Pulmonary:     Effort: Pulmonary effort is normal. No respiratory distress.     Breath sounds: Normal breath sounds.  Skin:    General: Skin is warm and dry.     Findings: No rash.  Neurological:     General: No  focal deficit present.     Mental Status: He is alert and oriented to person, place, and time.     Cranial Nerves: Cranial nerves are intact.     Sensory: Sensation is intact.     Motor: Motor function is intact.     Coordination: Coordination is intact.  Psychiatric:        Speech: Speech normal.        Behavior: Behavior normal.        Thought Content: Thought content normal.      Results for orders placed or performed during the hospital encounter of 10/27/19  SARS CORONAVIRUS 2 (TAT 6-24 HRS) Nasopharyngeal Nasopharyngeal Swab   Specimen: Nasopharyngeal Swab  Result Value Ref Range   SARS Coronavirus 2 NEGATIVE NEGATIVE  Comprehensive metabolic panel  Result Value Ref Range   Sodium 131 (L) 135 - 145 mmol/L   Potassium 5.0 3.5 - 5.1 mmol/L   Chloride 94 (L) 98 - 111 mmol/L   CO2 21 (L) 22 - 32 mmol/L   Glucose, Bld 89 70 - 99 mg/dL   BUN 83 (H) 6 - 20 mg/dL   Creatinine, Ser 19.31 (H) 0.61 - 1.24 mg/dL   Calcium 8.8 (L) 8.9 - 10.3 mg/dL   Total Protein 7.0 6.5 - 8.1 g/dL   Albumin 3.7 3.5 - 5.0 g/dL   AST 10 (L) 15 - 41 U/L   ALT 16 0 - 44 U/L   Alkaline Phosphatase 45 38 - 126 U/L   Total Bilirubin 0.6 0.3 - 1.2 mg/dL   GFR calc non Af Amer 2 (L) >60 mL/min   GFR calc Af Amer 3 (L) >60 mL/min   Anion gap 16 (H) 5 - 15  Lipase, blood  Result Value Ref Range   Lipase 31 11 - 51 U/L  CBC with Differential  Result Value Ref Range   WBC 11.1 (H) 4.0 - 10.5 K/uL   RBC 3.13 (L) 4.22 - 5.81 MIL/uL   Hemoglobin 10.5 (L) 13.0 - 17.0 g/dL   HCT 31.7 (L) 39.0 - 52.0 %   MCV 101.3 (H) 80.0 - 100.0 fL   MCH 33.5 26.0 - 34.0 pg   MCHC 33.1 30.0 - 36.0 g/dL   RDW 13.4 11.5 - 15.5 %   Platelets 136 (L) 150 - 400 K/uL   nRBC 0.0 0.0 - 0.2 %   Neutrophils Relative % 77 %   Neutro Abs 8.6 (H) 1.7 - 7.7 K/uL  Lymphocytes Relative 12 %   Lymphs Abs 1.3 0.7 - 4.0 K/uL   Monocytes Relative 9 %   Monocytes Absolute 1.0 0.1 - 1.0 K/uL   Eosinophils Relative 1 %   Eosinophils  Absolute 0.1 0.0 - 0.5 K/uL   Basophils Relative 1 %   Basophils Absolute 0.1 0.0 - 0.1 K/uL   Immature Granulocytes 0 %   Abs Immature Granulocytes 0.04 0.00 - 0.07 K/uL  D-dimer, quantitative  Result Value Ref Range   D-Dimer, Quant 0.40 0.00 - 0.50 ug/mL-FEU  Troponin I (High Sensitivity)  Result Value Ref Range   Troponin I (High Sensitivity) 12 <18 ng/L  Troponin I (High Sensitivity)  Result Value Ref Range   Troponin I (High Sensitivity) 11 <18 ng/L    This visit occurred during the SARS-CoV-2 public health emergency.  Safety protocols were in place, including screening questions prior to the visit, additional usage of staff PPE, and extensive cleaning of exam room while observing appropriate contact time as indicated for disinfecting solutions.   COVID 19 screen:  No recent travel or known exposure to COVID19 The patient denies respiratory symptoms of COVID 19 at this time. The importance of social distancing was discussed today.   Assessment and Plan Problem List Items Addressed This Visit     Vertigo - Primary    Possibly due to fluid behind TM vs BPPV.   Treat with flonase and desensitization exercise.  Normal neuro exam, no red flags.  if not improving consider further eval.            Eliezer Lofts, MD

## 2021-03-22 DIAGNOSIS — N2581 Secondary hyperparathyroidism of renal origin: Secondary | ICD-10-CM | POA: Diagnosis not present

## 2021-03-22 DIAGNOSIS — D509 Iron deficiency anemia, unspecified: Secondary | ICD-10-CM | POA: Diagnosis not present

## 2021-03-22 DIAGNOSIS — M1040 Other secondary gout, unspecified site: Secondary | ICD-10-CM | POA: Diagnosis not present

## 2021-03-22 DIAGNOSIS — N186 End stage renal disease: Secondary | ICD-10-CM | POA: Diagnosis not present

## 2021-03-22 DIAGNOSIS — N2589 Other disorders resulting from impaired renal tubular function: Secondary | ICD-10-CM | POA: Diagnosis not present

## 2021-03-22 DIAGNOSIS — K769 Liver disease, unspecified: Secondary | ICD-10-CM | POA: Diagnosis not present

## 2021-03-22 DIAGNOSIS — Z992 Dependence on renal dialysis: Secondary | ICD-10-CM | POA: Diagnosis not present

## 2021-03-23 DIAGNOSIS — N2581 Secondary hyperparathyroidism of renal origin: Secondary | ICD-10-CM | POA: Diagnosis not present

## 2021-03-23 DIAGNOSIS — N2589 Other disorders resulting from impaired renal tubular function: Secondary | ICD-10-CM | POA: Diagnosis not present

## 2021-03-23 DIAGNOSIS — Z992 Dependence on renal dialysis: Secondary | ICD-10-CM | POA: Diagnosis not present

## 2021-03-23 DIAGNOSIS — K769 Liver disease, unspecified: Secondary | ICD-10-CM | POA: Diagnosis not present

## 2021-03-23 DIAGNOSIS — N186 End stage renal disease: Secondary | ICD-10-CM | POA: Diagnosis not present

## 2021-03-23 DIAGNOSIS — D509 Iron deficiency anemia, unspecified: Secondary | ICD-10-CM | POA: Diagnosis not present

## 2021-03-24 DIAGNOSIS — Z992 Dependence on renal dialysis: Secondary | ICD-10-CM | POA: Diagnosis not present

## 2021-03-24 DIAGNOSIS — N186 End stage renal disease: Secondary | ICD-10-CM | POA: Diagnosis not present

## 2021-03-24 DIAGNOSIS — N2581 Secondary hyperparathyroidism of renal origin: Secondary | ICD-10-CM | POA: Diagnosis not present

## 2021-03-24 DIAGNOSIS — N2589 Other disorders resulting from impaired renal tubular function: Secondary | ICD-10-CM | POA: Diagnosis not present

## 2021-03-24 DIAGNOSIS — D509 Iron deficiency anemia, unspecified: Secondary | ICD-10-CM | POA: Diagnosis not present

## 2021-03-24 DIAGNOSIS — K769 Liver disease, unspecified: Secondary | ICD-10-CM | POA: Diagnosis not present

## 2021-03-27 DIAGNOSIS — D509 Iron deficiency anemia, unspecified: Secondary | ICD-10-CM | POA: Diagnosis not present

## 2021-03-27 DIAGNOSIS — H9121 Sudden idiopathic hearing loss, right ear: Secondary | ICD-10-CM | POA: Insufficient documentation

## 2021-03-27 DIAGNOSIS — N186 End stage renal disease: Secondary | ICD-10-CM | POA: Diagnosis not present

## 2021-03-27 DIAGNOSIS — R42 Dizziness and giddiness: Secondary | ICD-10-CM | POA: Diagnosis not present

## 2021-03-27 DIAGNOSIS — N2581 Secondary hyperparathyroidism of renal origin: Secondary | ICD-10-CM | POA: Diagnosis not present

## 2021-03-27 DIAGNOSIS — H9120 Sudden idiopathic hearing loss, unspecified ear: Secondary | ICD-10-CM | POA: Diagnosis not present

## 2021-03-27 DIAGNOSIS — H903 Sensorineural hearing loss, bilateral: Secondary | ICD-10-CM | POA: Diagnosis not present

## 2021-03-27 DIAGNOSIS — Z992 Dependence on renal dialysis: Secondary | ICD-10-CM | POA: Diagnosis not present

## 2021-03-27 DIAGNOSIS — K769 Liver disease, unspecified: Secondary | ICD-10-CM | POA: Diagnosis not present

## 2021-03-27 DIAGNOSIS — N2589 Other disorders resulting from impaired renal tubular function: Secondary | ICD-10-CM | POA: Diagnosis not present

## 2021-03-28 DIAGNOSIS — Z992 Dependence on renal dialysis: Secondary | ICD-10-CM | POA: Diagnosis not present

## 2021-03-28 DIAGNOSIS — N2581 Secondary hyperparathyroidism of renal origin: Secondary | ICD-10-CM | POA: Diagnosis not present

## 2021-03-28 DIAGNOSIS — N2589 Other disorders resulting from impaired renal tubular function: Secondary | ICD-10-CM | POA: Diagnosis not present

## 2021-03-28 DIAGNOSIS — K769 Liver disease, unspecified: Secondary | ICD-10-CM | POA: Diagnosis not present

## 2021-03-28 DIAGNOSIS — N186 End stage renal disease: Secondary | ICD-10-CM | POA: Diagnosis not present

## 2021-03-28 DIAGNOSIS — D509 Iron deficiency anemia, unspecified: Secondary | ICD-10-CM | POA: Diagnosis not present

## 2021-03-29 ENCOUNTER — Other Ambulatory Visit: Payer: Self-pay | Admitting: Physician Assistant

## 2021-03-29 DIAGNOSIS — H9120 Sudden idiopathic hearing loss, unspecified ear: Secondary | ICD-10-CM

## 2021-03-30 DIAGNOSIS — N186 End stage renal disease: Secondary | ICD-10-CM | POA: Diagnosis not present

## 2021-03-30 DIAGNOSIS — N2581 Secondary hyperparathyroidism of renal origin: Secondary | ICD-10-CM | POA: Diagnosis not present

## 2021-03-30 DIAGNOSIS — D509 Iron deficiency anemia, unspecified: Secondary | ICD-10-CM | POA: Diagnosis not present

## 2021-03-30 DIAGNOSIS — N2589 Other disorders resulting from impaired renal tubular function: Secondary | ICD-10-CM | POA: Diagnosis not present

## 2021-03-30 DIAGNOSIS — Z992 Dependence on renal dialysis: Secondary | ICD-10-CM | POA: Diagnosis not present

## 2021-03-30 DIAGNOSIS — K769 Liver disease, unspecified: Secondary | ICD-10-CM | POA: Diagnosis not present

## 2021-03-31 DIAGNOSIS — K769 Liver disease, unspecified: Secondary | ICD-10-CM | POA: Diagnosis not present

## 2021-03-31 DIAGNOSIS — N2581 Secondary hyperparathyroidism of renal origin: Secondary | ICD-10-CM | POA: Diagnosis not present

## 2021-03-31 DIAGNOSIS — N186 End stage renal disease: Secondary | ICD-10-CM | POA: Diagnosis not present

## 2021-03-31 DIAGNOSIS — Z992 Dependence on renal dialysis: Secondary | ICD-10-CM | POA: Diagnosis not present

## 2021-03-31 DIAGNOSIS — N2589 Other disorders resulting from impaired renal tubular function: Secondary | ICD-10-CM | POA: Diagnosis not present

## 2021-03-31 DIAGNOSIS — D509 Iron deficiency anemia, unspecified: Secondary | ICD-10-CM | POA: Diagnosis not present

## 2021-04-03 DIAGNOSIS — Z992 Dependence on renal dialysis: Secondary | ICD-10-CM | POA: Diagnosis not present

## 2021-04-03 DIAGNOSIS — D509 Iron deficiency anemia, unspecified: Secondary | ICD-10-CM | POA: Diagnosis not present

## 2021-04-03 DIAGNOSIS — N2581 Secondary hyperparathyroidism of renal origin: Secondary | ICD-10-CM | POA: Diagnosis not present

## 2021-04-03 DIAGNOSIS — N2589 Other disorders resulting from impaired renal tubular function: Secondary | ICD-10-CM | POA: Diagnosis not present

## 2021-04-03 DIAGNOSIS — K769 Liver disease, unspecified: Secondary | ICD-10-CM | POA: Diagnosis not present

## 2021-04-03 DIAGNOSIS — N186 End stage renal disease: Secondary | ICD-10-CM | POA: Diagnosis not present

## 2021-04-04 DIAGNOSIS — K769 Liver disease, unspecified: Secondary | ICD-10-CM | POA: Diagnosis not present

## 2021-04-04 DIAGNOSIS — D509 Iron deficiency anemia, unspecified: Secondary | ICD-10-CM | POA: Diagnosis not present

## 2021-04-04 DIAGNOSIS — Z992 Dependence on renal dialysis: Secondary | ICD-10-CM | POA: Diagnosis not present

## 2021-04-04 DIAGNOSIS — N186 End stage renal disease: Secondary | ICD-10-CM | POA: Diagnosis not present

## 2021-04-04 DIAGNOSIS — N2581 Secondary hyperparathyroidism of renal origin: Secondary | ICD-10-CM | POA: Diagnosis not present

## 2021-04-04 DIAGNOSIS — N2589 Other disorders resulting from impaired renal tubular function: Secondary | ICD-10-CM | POA: Diagnosis not present

## 2021-04-05 DIAGNOSIS — D509 Iron deficiency anemia, unspecified: Secondary | ICD-10-CM | POA: Diagnosis not present

## 2021-04-05 DIAGNOSIS — K769 Liver disease, unspecified: Secondary | ICD-10-CM | POA: Diagnosis not present

## 2021-04-05 DIAGNOSIS — N186 End stage renal disease: Secondary | ICD-10-CM | POA: Diagnosis not present

## 2021-04-05 DIAGNOSIS — N2581 Secondary hyperparathyroidism of renal origin: Secondary | ICD-10-CM | POA: Diagnosis not present

## 2021-04-05 DIAGNOSIS — N2589 Other disorders resulting from impaired renal tubular function: Secondary | ICD-10-CM | POA: Diagnosis not present

## 2021-04-05 DIAGNOSIS — Z992 Dependence on renal dialysis: Secondary | ICD-10-CM | POA: Diagnosis not present

## 2021-04-06 DIAGNOSIS — D2261 Melanocytic nevi of right upper limb, including shoulder: Secondary | ICD-10-CM | POA: Diagnosis not present

## 2021-04-06 DIAGNOSIS — D2271 Melanocytic nevi of right lower limb, including hip: Secondary | ICD-10-CM | POA: Diagnosis not present

## 2021-04-06 DIAGNOSIS — L814 Other melanin hyperpigmentation: Secondary | ICD-10-CM | POA: Diagnosis not present

## 2021-04-06 DIAGNOSIS — L298 Other pruritus: Secondary | ICD-10-CM | POA: Diagnosis not present

## 2021-04-06 DIAGNOSIS — D2361 Other benign neoplasm of skin of right upper limb, including shoulder: Secondary | ICD-10-CM | POA: Diagnosis not present

## 2021-04-06 DIAGNOSIS — D2272 Melanocytic nevi of left lower limb, including hip: Secondary | ICD-10-CM | POA: Diagnosis not present

## 2021-04-06 DIAGNOSIS — N186 End stage renal disease: Secondary | ICD-10-CM | POA: Diagnosis not present

## 2021-04-06 DIAGNOSIS — Z992 Dependence on renal dialysis: Secondary | ICD-10-CM | POA: Diagnosis not present

## 2021-04-06 DIAGNOSIS — N2581 Secondary hyperparathyroidism of renal origin: Secondary | ICD-10-CM | POA: Diagnosis not present

## 2021-04-06 DIAGNOSIS — D2262 Melanocytic nevi of left upper limb, including shoulder: Secondary | ICD-10-CM | POA: Diagnosis not present

## 2021-04-06 DIAGNOSIS — D225 Melanocytic nevi of trunk: Secondary | ICD-10-CM | POA: Diagnosis not present

## 2021-04-06 DIAGNOSIS — N2589 Other disorders resulting from impaired renal tubular function: Secondary | ICD-10-CM | POA: Diagnosis not present

## 2021-04-06 DIAGNOSIS — D509 Iron deficiency anemia, unspecified: Secondary | ICD-10-CM | POA: Diagnosis not present

## 2021-04-06 DIAGNOSIS — D171 Benign lipomatous neoplasm of skin and subcutaneous tissue of trunk: Secondary | ICD-10-CM | POA: Diagnosis not present

## 2021-04-06 DIAGNOSIS — D1801 Hemangioma of skin and subcutaneous tissue: Secondary | ICD-10-CM | POA: Diagnosis not present

## 2021-04-06 DIAGNOSIS — D485 Neoplasm of uncertain behavior of skin: Secondary | ICD-10-CM | POA: Diagnosis not present

## 2021-04-06 DIAGNOSIS — K769 Liver disease, unspecified: Secondary | ICD-10-CM | POA: Diagnosis not present

## 2021-04-07 DIAGNOSIS — D509 Iron deficiency anemia, unspecified: Secondary | ICD-10-CM | POA: Diagnosis not present

## 2021-04-07 DIAGNOSIS — N2581 Secondary hyperparathyroidism of renal origin: Secondary | ICD-10-CM | POA: Diagnosis not present

## 2021-04-07 DIAGNOSIS — N186 End stage renal disease: Secondary | ICD-10-CM | POA: Diagnosis not present

## 2021-04-07 DIAGNOSIS — N2589 Other disorders resulting from impaired renal tubular function: Secondary | ICD-10-CM | POA: Diagnosis not present

## 2021-04-07 DIAGNOSIS — Z992 Dependence on renal dialysis: Secondary | ICD-10-CM | POA: Diagnosis not present

## 2021-04-07 DIAGNOSIS — K769 Liver disease, unspecified: Secondary | ICD-10-CM | POA: Diagnosis not present

## 2021-04-10 DIAGNOSIS — Z992 Dependence on renal dialysis: Secondary | ICD-10-CM | POA: Diagnosis not present

## 2021-04-10 DIAGNOSIS — D509 Iron deficiency anemia, unspecified: Secondary | ICD-10-CM | POA: Diagnosis not present

## 2021-04-10 DIAGNOSIS — K769 Liver disease, unspecified: Secondary | ICD-10-CM | POA: Diagnosis not present

## 2021-04-10 DIAGNOSIS — N186 End stage renal disease: Secondary | ICD-10-CM | POA: Diagnosis not present

## 2021-04-10 DIAGNOSIS — N2589 Other disorders resulting from impaired renal tubular function: Secondary | ICD-10-CM | POA: Diagnosis not present

## 2021-04-10 DIAGNOSIS — N2581 Secondary hyperparathyroidism of renal origin: Secondary | ICD-10-CM | POA: Diagnosis not present

## 2021-04-11 DIAGNOSIS — N186 End stage renal disease: Secondary | ICD-10-CM | POA: Diagnosis not present

## 2021-04-11 DIAGNOSIS — K769 Liver disease, unspecified: Secondary | ICD-10-CM | POA: Diagnosis not present

## 2021-04-11 DIAGNOSIS — D509 Iron deficiency anemia, unspecified: Secondary | ICD-10-CM | POA: Diagnosis not present

## 2021-04-11 DIAGNOSIS — Z992 Dependence on renal dialysis: Secondary | ICD-10-CM | POA: Diagnosis not present

## 2021-04-11 DIAGNOSIS — N2581 Secondary hyperparathyroidism of renal origin: Secondary | ICD-10-CM | POA: Diagnosis not present

## 2021-04-11 DIAGNOSIS — N2589 Other disorders resulting from impaired renal tubular function: Secondary | ICD-10-CM | POA: Diagnosis not present

## 2021-04-13 DIAGNOSIS — K769 Liver disease, unspecified: Secondary | ICD-10-CM | POA: Diagnosis not present

## 2021-04-13 DIAGNOSIS — D509 Iron deficiency anemia, unspecified: Secondary | ICD-10-CM | POA: Diagnosis not present

## 2021-04-13 DIAGNOSIS — N2581 Secondary hyperparathyroidism of renal origin: Secondary | ICD-10-CM | POA: Diagnosis not present

## 2021-04-13 DIAGNOSIS — N2589 Other disorders resulting from impaired renal tubular function: Secondary | ICD-10-CM | POA: Diagnosis not present

## 2021-04-13 DIAGNOSIS — Z992 Dependence on renal dialysis: Secondary | ICD-10-CM | POA: Diagnosis not present

## 2021-04-13 DIAGNOSIS — N186 End stage renal disease: Secondary | ICD-10-CM | POA: Diagnosis not present

## 2021-04-14 DIAGNOSIS — D631 Anemia in chronic kidney disease: Secondary | ICD-10-CM | POA: Diagnosis not present

## 2021-04-14 DIAGNOSIS — N028 Recurrent and persistent hematuria with other morphologic changes: Secondary | ICD-10-CM | POA: Diagnosis not present

## 2021-04-14 DIAGNOSIS — Z992 Dependence on renal dialysis: Secondary | ICD-10-CM | POA: Diagnosis not present

## 2021-04-14 DIAGNOSIS — D509 Iron deficiency anemia, unspecified: Secondary | ICD-10-CM | POA: Diagnosis not present

## 2021-04-14 DIAGNOSIS — E440 Moderate protein-calorie malnutrition: Secondary | ICD-10-CM | POA: Diagnosis not present

## 2021-04-14 DIAGNOSIS — Z79899 Other long term (current) drug therapy: Secondary | ICD-10-CM | POA: Diagnosis not present

## 2021-04-14 DIAGNOSIS — N2581 Secondary hyperparathyroidism of renal origin: Secondary | ICD-10-CM | POA: Diagnosis not present

## 2021-04-14 DIAGNOSIS — I120 Hypertensive chronic kidney disease with stage 5 chronic kidney disease or end stage renal disease: Secondary | ICD-10-CM | POA: Diagnosis not present

## 2021-04-14 DIAGNOSIS — N186 End stage renal disease: Secondary | ICD-10-CM | POA: Diagnosis not present

## 2021-04-17 DIAGNOSIS — N2581 Secondary hyperparathyroidism of renal origin: Secondary | ICD-10-CM | POA: Diagnosis not present

## 2021-04-17 DIAGNOSIS — D509 Iron deficiency anemia, unspecified: Secondary | ICD-10-CM | POA: Diagnosis not present

## 2021-04-17 DIAGNOSIS — Z79899 Other long term (current) drug therapy: Secondary | ICD-10-CM | POA: Diagnosis not present

## 2021-04-17 DIAGNOSIS — D631 Anemia in chronic kidney disease: Secondary | ICD-10-CM | POA: Diagnosis not present

## 2021-04-17 DIAGNOSIS — N186 End stage renal disease: Secondary | ICD-10-CM | POA: Diagnosis not present

## 2021-04-17 DIAGNOSIS — Z992 Dependence on renal dialysis: Secondary | ICD-10-CM | POA: Diagnosis not present

## 2021-04-18 DIAGNOSIS — N2581 Secondary hyperparathyroidism of renal origin: Secondary | ICD-10-CM | POA: Diagnosis not present

## 2021-04-18 DIAGNOSIS — N186 End stage renal disease: Secondary | ICD-10-CM | POA: Diagnosis not present

## 2021-04-18 DIAGNOSIS — D631 Anemia in chronic kidney disease: Secondary | ICD-10-CM | POA: Diagnosis not present

## 2021-04-18 DIAGNOSIS — Z992 Dependence on renal dialysis: Secondary | ICD-10-CM | POA: Diagnosis not present

## 2021-04-18 DIAGNOSIS — Z79899 Other long term (current) drug therapy: Secondary | ICD-10-CM | POA: Diagnosis not present

## 2021-04-18 DIAGNOSIS — D509 Iron deficiency anemia, unspecified: Secondary | ICD-10-CM | POA: Diagnosis not present

## 2021-04-20 ENCOUNTER — Ambulatory Visit
Admission: RE | Admit: 2021-04-20 | Discharge: 2021-04-20 | Disposition: A | Discharge status: Home / Self Care | Payer: Medicare Other | Source: Ambulatory Visit | Attending: Physician Assistant | Admitting: Physician Assistant

## 2021-04-20 ENCOUNTER — Other Ambulatory Visit: Payer: Self-pay

## 2021-04-20 ENCOUNTER — Other Ambulatory Visit: Payer: Self-pay | Admitting: Physician Assistant

## 2021-04-20 DIAGNOSIS — D509 Iron deficiency anemia, unspecified: Secondary | ICD-10-CM | POA: Diagnosis not present

## 2021-04-20 DIAGNOSIS — Z79899 Other long term (current) drug therapy: Secondary | ICD-10-CM | POA: Diagnosis not present

## 2021-04-20 DIAGNOSIS — N186 End stage renal disease: Secondary | ICD-10-CM | POA: Diagnosis not present

## 2021-04-20 DIAGNOSIS — Z992 Dependence on renal dialysis: Secondary | ICD-10-CM | POA: Diagnosis not present

## 2021-04-20 DIAGNOSIS — H9120 Sudden idiopathic hearing loss, unspecified ear: Secondary | ICD-10-CM

## 2021-04-20 DIAGNOSIS — D631 Anemia in chronic kidney disease: Secondary | ICD-10-CM | POA: Diagnosis not present

## 2021-04-20 DIAGNOSIS — N2581 Secondary hyperparathyroidism of renal origin: Secondary | ICD-10-CM | POA: Diagnosis not present

## 2021-04-20 DIAGNOSIS — H9041 Sensorineural hearing loss, unilateral, right ear, with unrestricted hearing on the contralateral side: Secondary | ICD-10-CM | POA: Diagnosis not present

## 2021-04-20 DIAGNOSIS — J3489 Other specified disorders of nose and nasal sinuses: Secondary | ICD-10-CM | POA: Diagnosis not present

## 2021-04-21 DIAGNOSIS — Z992 Dependence on renal dialysis: Secondary | ICD-10-CM | POA: Diagnosis not present

## 2021-04-21 DIAGNOSIS — Z79899 Other long term (current) drug therapy: Secondary | ICD-10-CM | POA: Diagnosis not present

## 2021-04-21 DIAGNOSIS — N186 End stage renal disease: Secondary | ICD-10-CM | POA: Diagnosis not present

## 2021-04-21 DIAGNOSIS — D631 Anemia in chronic kidney disease: Secondary | ICD-10-CM | POA: Diagnosis not present

## 2021-04-21 DIAGNOSIS — N2581 Secondary hyperparathyroidism of renal origin: Secondary | ICD-10-CM | POA: Diagnosis not present

## 2021-04-21 DIAGNOSIS — D509 Iron deficiency anemia, unspecified: Secondary | ICD-10-CM | POA: Diagnosis not present

## 2021-04-24 DIAGNOSIS — Z79899 Other long term (current) drug therapy: Secondary | ICD-10-CM | POA: Diagnosis not present

## 2021-04-24 DIAGNOSIS — N2581 Secondary hyperparathyroidism of renal origin: Secondary | ICD-10-CM | POA: Diagnosis not present

## 2021-04-24 DIAGNOSIS — D631 Anemia in chronic kidney disease: Secondary | ICD-10-CM | POA: Diagnosis not present

## 2021-04-24 DIAGNOSIS — Z992 Dependence on renal dialysis: Secondary | ICD-10-CM | POA: Diagnosis not present

## 2021-04-24 DIAGNOSIS — N186 End stage renal disease: Secondary | ICD-10-CM | POA: Diagnosis not present

## 2021-04-24 DIAGNOSIS — D509 Iron deficiency anemia, unspecified: Secondary | ICD-10-CM | POA: Diagnosis not present

## 2021-04-24 DIAGNOSIS — H903 Sensorineural hearing loss, bilateral: Secondary | ICD-10-CM | POA: Diagnosis not present

## 2021-04-24 DIAGNOSIS — H9120 Sudden idiopathic hearing loss, unspecified ear: Secondary | ICD-10-CM | POA: Diagnosis not present

## 2021-04-25 DIAGNOSIS — N186 End stage renal disease: Secondary | ICD-10-CM | POA: Diagnosis not present

## 2021-04-25 DIAGNOSIS — Z79899 Other long term (current) drug therapy: Secondary | ICD-10-CM | POA: Diagnosis not present

## 2021-04-25 DIAGNOSIS — N2581 Secondary hyperparathyroidism of renal origin: Secondary | ICD-10-CM | POA: Diagnosis not present

## 2021-04-25 DIAGNOSIS — Z992 Dependence on renal dialysis: Secondary | ICD-10-CM | POA: Diagnosis not present

## 2021-04-25 DIAGNOSIS — D631 Anemia in chronic kidney disease: Secondary | ICD-10-CM | POA: Diagnosis not present

## 2021-04-25 DIAGNOSIS — D509 Iron deficiency anemia, unspecified: Secondary | ICD-10-CM | POA: Diagnosis not present

## 2021-04-26 DIAGNOSIS — D509 Iron deficiency anemia, unspecified: Secondary | ICD-10-CM | POA: Diagnosis not present

## 2021-04-26 DIAGNOSIS — M1040 Other secondary gout, unspecified site: Secondary | ICD-10-CM | POA: Diagnosis not present

## 2021-04-26 DIAGNOSIS — N2581 Secondary hyperparathyroidism of renal origin: Secondary | ICD-10-CM | POA: Diagnosis not present

## 2021-04-26 DIAGNOSIS — D631 Anemia in chronic kidney disease: Secondary | ICD-10-CM | POA: Diagnosis not present

## 2021-04-26 DIAGNOSIS — Z79899 Other long term (current) drug therapy: Secondary | ICD-10-CM | POA: Diagnosis not present

## 2021-04-26 DIAGNOSIS — N186 End stage renal disease: Secondary | ICD-10-CM | POA: Diagnosis not present

## 2021-04-26 DIAGNOSIS — Z992 Dependence on renal dialysis: Secondary | ICD-10-CM | POA: Diagnosis not present

## 2021-04-27 DIAGNOSIS — Z992 Dependence on renal dialysis: Secondary | ICD-10-CM | POA: Diagnosis not present

## 2021-04-27 DIAGNOSIS — N186 End stage renal disease: Secondary | ICD-10-CM | POA: Diagnosis not present

## 2021-04-27 DIAGNOSIS — D631 Anemia in chronic kidney disease: Secondary | ICD-10-CM | POA: Diagnosis not present

## 2021-04-27 DIAGNOSIS — N2581 Secondary hyperparathyroidism of renal origin: Secondary | ICD-10-CM | POA: Diagnosis not present

## 2021-04-27 DIAGNOSIS — Z79899 Other long term (current) drug therapy: Secondary | ICD-10-CM | POA: Diagnosis not present

## 2021-04-27 DIAGNOSIS — D509 Iron deficiency anemia, unspecified: Secondary | ICD-10-CM | POA: Diagnosis not present

## 2021-04-28 DIAGNOSIS — Z79899 Other long term (current) drug therapy: Secondary | ICD-10-CM | POA: Diagnosis not present

## 2021-04-28 DIAGNOSIS — N186 End stage renal disease: Secondary | ICD-10-CM | POA: Diagnosis not present

## 2021-04-28 DIAGNOSIS — Z992 Dependence on renal dialysis: Secondary | ICD-10-CM | POA: Diagnosis not present

## 2021-04-28 DIAGNOSIS — N2581 Secondary hyperparathyroidism of renal origin: Secondary | ICD-10-CM | POA: Diagnosis not present

## 2021-04-28 DIAGNOSIS — D509 Iron deficiency anemia, unspecified: Secondary | ICD-10-CM | POA: Diagnosis not present

## 2021-04-28 DIAGNOSIS — D631 Anemia in chronic kidney disease: Secondary | ICD-10-CM | POA: Diagnosis not present

## 2021-05-01 DIAGNOSIS — Z992 Dependence on renal dialysis: Secondary | ICD-10-CM | POA: Diagnosis not present

## 2021-05-01 DIAGNOSIS — N186 End stage renal disease: Secondary | ICD-10-CM | POA: Diagnosis not present

## 2021-05-01 DIAGNOSIS — Z79899 Other long term (current) drug therapy: Secondary | ICD-10-CM | POA: Diagnosis not present

## 2021-05-01 DIAGNOSIS — D631 Anemia in chronic kidney disease: Secondary | ICD-10-CM | POA: Diagnosis not present

## 2021-05-01 DIAGNOSIS — D509 Iron deficiency anemia, unspecified: Secondary | ICD-10-CM | POA: Diagnosis not present

## 2021-05-01 DIAGNOSIS — N2581 Secondary hyperparathyroidism of renal origin: Secondary | ICD-10-CM | POA: Diagnosis not present

## 2021-05-02 DIAGNOSIS — Z79899 Other long term (current) drug therapy: Secondary | ICD-10-CM | POA: Diagnosis not present

## 2021-05-02 DIAGNOSIS — D631 Anemia in chronic kidney disease: Secondary | ICD-10-CM | POA: Diagnosis not present

## 2021-05-02 DIAGNOSIS — Z992 Dependence on renal dialysis: Secondary | ICD-10-CM | POA: Diagnosis not present

## 2021-05-02 DIAGNOSIS — N2581 Secondary hyperparathyroidism of renal origin: Secondary | ICD-10-CM | POA: Diagnosis not present

## 2021-05-02 DIAGNOSIS — D509 Iron deficiency anemia, unspecified: Secondary | ICD-10-CM | POA: Diagnosis not present

## 2021-05-02 DIAGNOSIS — N186 End stage renal disease: Secondary | ICD-10-CM | POA: Diagnosis not present

## 2021-05-04 DIAGNOSIS — N186 End stage renal disease: Secondary | ICD-10-CM | POA: Diagnosis not present

## 2021-05-04 DIAGNOSIS — Z79899 Other long term (current) drug therapy: Secondary | ICD-10-CM | POA: Diagnosis not present

## 2021-05-04 DIAGNOSIS — Z992 Dependence on renal dialysis: Secondary | ICD-10-CM | POA: Diagnosis not present

## 2021-05-04 DIAGNOSIS — D631 Anemia in chronic kidney disease: Secondary | ICD-10-CM | POA: Diagnosis not present

## 2021-05-04 DIAGNOSIS — D509 Iron deficiency anemia, unspecified: Secondary | ICD-10-CM | POA: Diagnosis not present

## 2021-05-04 DIAGNOSIS — N2581 Secondary hyperparathyroidism of renal origin: Secondary | ICD-10-CM | POA: Diagnosis not present

## 2021-05-05 DIAGNOSIS — Z79899 Other long term (current) drug therapy: Secondary | ICD-10-CM | POA: Diagnosis not present

## 2021-05-05 DIAGNOSIS — Z992 Dependence on renal dialysis: Secondary | ICD-10-CM | POA: Diagnosis not present

## 2021-05-05 DIAGNOSIS — D631 Anemia in chronic kidney disease: Secondary | ICD-10-CM | POA: Diagnosis not present

## 2021-05-05 DIAGNOSIS — D509 Iron deficiency anemia, unspecified: Secondary | ICD-10-CM | POA: Diagnosis not present

## 2021-05-05 DIAGNOSIS — N186 End stage renal disease: Secondary | ICD-10-CM | POA: Diagnosis not present

## 2021-05-05 DIAGNOSIS — N2581 Secondary hyperparathyroidism of renal origin: Secondary | ICD-10-CM | POA: Diagnosis not present

## 2021-05-08 DIAGNOSIS — D509 Iron deficiency anemia, unspecified: Secondary | ICD-10-CM | POA: Diagnosis not present

## 2021-05-08 DIAGNOSIS — D631 Anemia in chronic kidney disease: Secondary | ICD-10-CM | POA: Diagnosis not present

## 2021-05-08 DIAGNOSIS — Z992 Dependence on renal dialysis: Secondary | ICD-10-CM | POA: Diagnosis not present

## 2021-05-08 DIAGNOSIS — N2581 Secondary hyperparathyroidism of renal origin: Secondary | ICD-10-CM | POA: Diagnosis not present

## 2021-05-08 DIAGNOSIS — Z79899 Other long term (current) drug therapy: Secondary | ICD-10-CM | POA: Diagnosis not present

## 2021-05-08 DIAGNOSIS — H903 Sensorineural hearing loss, bilateral: Secondary | ICD-10-CM | POA: Insufficient documentation

## 2021-05-08 DIAGNOSIS — N186 End stage renal disease: Secondary | ICD-10-CM | POA: Diagnosis not present

## 2021-05-09 DIAGNOSIS — Z992 Dependence on renal dialysis: Secondary | ICD-10-CM | POA: Diagnosis not present

## 2021-05-09 DIAGNOSIS — N2581 Secondary hyperparathyroidism of renal origin: Secondary | ICD-10-CM | POA: Diagnosis not present

## 2021-05-09 DIAGNOSIS — N186 End stage renal disease: Secondary | ICD-10-CM | POA: Diagnosis not present

## 2021-05-09 DIAGNOSIS — D509 Iron deficiency anemia, unspecified: Secondary | ICD-10-CM | POA: Diagnosis not present

## 2021-05-09 DIAGNOSIS — D631 Anemia in chronic kidney disease: Secondary | ICD-10-CM | POA: Diagnosis not present

## 2021-05-09 DIAGNOSIS — Z79899 Other long term (current) drug therapy: Secondary | ICD-10-CM | POA: Diagnosis not present

## 2021-05-11 DIAGNOSIS — D509 Iron deficiency anemia, unspecified: Secondary | ICD-10-CM | POA: Diagnosis not present

## 2021-05-11 DIAGNOSIS — N186 End stage renal disease: Secondary | ICD-10-CM | POA: Diagnosis not present

## 2021-05-11 DIAGNOSIS — D631 Anemia in chronic kidney disease: Secondary | ICD-10-CM | POA: Diagnosis not present

## 2021-05-11 DIAGNOSIS — N2581 Secondary hyperparathyroidism of renal origin: Secondary | ICD-10-CM | POA: Diagnosis not present

## 2021-05-11 DIAGNOSIS — Z992 Dependence on renal dialysis: Secondary | ICD-10-CM | POA: Diagnosis not present

## 2021-05-11 DIAGNOSIS — Z79899 Other long term (current) drug therapy: Secondary | ICD-10-CM | POA: Diagnosis not present

## 2021-05-12 DIAGNOSIS — Z79899 Other long term (current) drug therapy: Secondary | ICD-10-CM | POA: Diagnosis not present

## 2021-05-12 DIAGNOSIS — N2581 Secondary hyperparathyroidism of renal origin: Secondary | ICD-10-CM | POA: Diagnosis not present

## 2021-05-12 DIAGNOSIS — Z992 Dependence on renal dialysis: Secondary | ICD-10-CM | POA: Diagnosis not present

## 2021-05-12 DIAGNOSIS — N186 End stage renal disease: Secondary | ICD-10-CM | POA: Diagnosis not present

## 2021-05-12 DIAGNOSIS — D631 Anemia in chronic kidney disease: Secondary | ICD-10-CM | POA: Diagnosis not present

## 2021-05-12 DIAGNOSIS — D509 Iron deficiency anemia, unspecified: Secondary | ICD-10-CM | POA: Diagnosis not present

## 2021-05-15 DIAGNOSIS — Z01818 Encounter for other preprocedural examination: Secondary | ICD-10-CM | POA: Diagnosis not present

## 2021-05-15 DIAGNOSIS — R93429 Abnormal radiologic findings on diagnostic imaging of unspecified kidney: Secondary | ICD-10-CM | POA: Diagnosis not present

## 2021-05-15 DIAGNOSIS — N028 Recurrent and persistent hematuria with other morphologic changes: Secondary | ICD-10-CM | POA: Diagnosis not present

## 2021-05-15 DIAGNOSIS — I120 Hypertensive chronic kidney disease with stage 5 chronic kidney disease or end stage renal disease: Secondary | ICD-10-CM | POA: Diagnosis not present

## 2021-05-15 DIAGNOSIS — N2889 Other specified disorders of kidney and ureter: Secondary | ICD-10-CM | POA: Diagnosis not present

## 2021-05-15 DIAGNOSIS — Z992 Dependence on renal dialysis: Secondary | ICD-10-CM | POA: Diagnosis not present

## 2021-05-15 DIAGNOSIS — D509 Iron deficiency anemia, unspecified: Secondary | ICD-10-CM | POA: Diagnosis not present

## 2021-05-15 DIAGNOSIS — D631 Anemia in chronic kidney disease: Secondary | ICD-10-CM | POA: Diagnosis not present

## 2021-05-15 DIAGNOSIS — Z79899 Other long term (current) drug therapy: Secondary | ICD-10-CM | POA: Diagnosis not present

## 2021-05-15 DIAGNOSIS — N186 End stage renal disease: Secondary | ICD-10-CM | POA: Diagnosis not present

## 2021-05-15 DIAGNOSIS — I151 Hypertension secondary to other renal disorders: Secondary | ICD-10-CM | POA: Diagnosis not present

## 2021-05-15 DIAGNOSIS — N2581 Secondary hyperparathyroidism of renal origin: Secondary | ICD-10-CM | POA: Diagnosis not present

## 2021-05-15 DIAGNOSIS — E440 Moderate protein-calorie malnutrition: Secondary | ICD-10-CM | POA: Diagnosis not present

## 2021-05-15 DIAGNOSIS — N189 Chronic kidney disease, unspecified: Secondary | ICD-10-CM | POA: Diagnosis not present

## 2021-05-16 DIAGNOSIS — N189 Chronic kidney disease, unspecified: Secondary | ICD-10-CM | POA: Diagnosis not present

## 2021-05-16 DIAGNOSIS — E440 Moderate protein-calorie malnutrition: Secondary | ICD-10-CM | POA: Diagnosis not present

## 2021-05-16 DIAGNOSIS — I151 Hypertension secondary to other renal disorders: Secondary | ICD-10-CM | POA: Diagnosis not present

## 2021-05-16 DIAGNOSIS — D631 Anemia in chronic kidney disease: Secondary | ICD-10-CM | POA: Diagnosis not present

## 2021-05-16 DIAGNOSIS — Z01818 Encounter for other preprocedural examination: Secondary | ICD-10-CM | POA: Diagnosis not present

## 2021-05-16 DIAGNOSIS — N186 End stage renal disease: Secondary | ICD-10-CM | POA: Diagnosis not present

## 2021-05-16 DIAGNOSIS — D509 Iron deficiency anemia, unspecified: Secondary | ICD-10-CM | POA: Diagnosis not present

## 2021-05-16 DIAGNOSIS — N2889 Other specified disorders of kidney and ureter: Secondary | ICD-10-CM | POA: Diagnosis not present

## 2021-05-16 DIAGNOSIS — Z992 Dependence on renal dialysis: Secondary | ICD-10-CM | POA: Diagnosis not present

## 2021-05-16 DIAGNOSIS — N2581 Secondary hyperparathyroidism of renal origin: Secondary | ICD-10-CM | POA: Diagnosis not present

## 2021-05-18 DIAGNOSIS — Z992 Dependence on renal dialysis: Secondary | ICD-10-CM | POA: Diagnosis not present

## 2021-05-18 DIAGNOSIS — D631 Anemia in chronic kidney disease: Secondary | ICD-10-CM | POA: Diagnosis not present

## 2021-05-18 DIAGNOSIS — D509 Iron deficiency anemia, unspecified: Secondary | ICD-10-CM | POA: Diagnosis not present

## 2021-05-18 DIAGNOSIS — E440 Moderate protein-calorie malnutrition: Secondary | ICD-10-CM | POA: Diagnosis not present

## 2021-05-18 DIAGNOSIS — N186 End stage renal disease: Secondary | ICD-10-CM | POA: Diagnosis not present

## 2021-05-18 DIAGNOSIS — N2581 Secondary hyperparathyroidism of renal origin: Secondary | ICD-10-CM | POA: Diagnosis not present

## 2021-05-19 DIAGNOSIS — D631 Anemia in chronic kidney disease: Secondary | ICD-10-CM | POA: Diagnosis not present

## 2021-05-19 DIAGNOSIS — N186 End stage renal disease: Secondary | ICD-10-CM | POA: Diagnosis not present

## 2021-05-19 DIAGNOSIS — D509 Iron deficiency anemia, unspecified: Secondary | ICD-10-CM | POA: Diagnosis not present

## 2021-05-19 DIAGNOSIS — E440 Moderate protein-calorie malnutrition: Secondary | ICD-10-CM | POA: Diagnosis not present

## 2021-05-19 DIAGNOSIS — N2581 Secondary hyperparathyroidism of renal origin: Secondary | ICD-10-CM | POA: Diagnosis not present

## 2021-05-19 DIAGNOSIS — Z992 Dependence on renal dialysis: Secondary | ICD-10-CM | POA: Diagnosis not present

## 2021-05-22 DIAGNOSIS — E440 Moderate protein-calorie malnutrition: Secondary | ICD-10-CM | POA: Diagnosis not present

## 2021-05-22 DIAGNOSIS — Z992 Dependence on renal dialysis: Secondary | ICD-10-CM | POA: Diagnosis not present

## 2021-05-22 DIAGNOSIS — D631 Anemia in chronic kidney disease: Secondary | ICD-10-CM | POA: Diagnosis not present

## 2021-05-22 DIAGNOSIS — D509 Iron deficiency anemia, unspecified: Secondary | ICD-10-CM | POA: Diagnosis not present

## 2021-05-22 DIAGNOSIS — N186 End stage renal disease: Secondary | ICD-10-CM | POA: Diagnosis not present

## 2021-05-22 DIAGNOSIS — N2581 Secondary hyperparathyroidism of renal origin: Secondary | ICD-10-CM | POA: Diagnosis not present

## 2021-05-23 DIAGNOSIS — N2581 Secondary hyperparathyroidism of renal origin: Secondary | ICD-10-CM | POA: Diagnosis not present

## 2021-05-23 DIAGNOSIS — E440 Moderate protein-calorie malnutrition: Secondary | ICD-10-CM | POA: Diagnosis not present

## 2021-05-23 DIAGNOSIS — N186 End stage renal disease: Secondary | ICD-10-CM | POA: Diagnosis not present

## 2021-05-23 DIAGNOSIS — D631 Anemia in chronic kidney disease: Secondary | ICD-10-CM | POA: Diagnosis not present

## 2021-05-23 DIAGNOSIS — D509 Iron deficiency anemia, unspecified: Secondary | ICD-10-CM | POA: Diagnosis not present

## 2021-05-23 DIAGNOSIS — Z992 Dependence on renal dialysis: Secondary | ICD-10-CM | POA: Diagnosis not present

## 2021-05-24 DIAGNOSIS — N186 End stage renal disease: Secondary | ICD-10-CM | POA: Diagnosis not present

## 2021-05-24 DIAGNOSIS — D509 Iron deficiency anemia, unspecified: Secondary | ICD-10-CM | POA: Diagnosis not present

## 2021-05-24 DIAGNOSIS — M1040 Other secondary gout, unspecified site: Secondary | ICD-10-CM | POA: Diagnosis not present

## 2021-05-24 DIAGNOSIS — D631 Anemia in chronic kidney disease: Secondary | ICD-10-CM | POA: Diagnosis not present

## 2021-05-24 DIAGNOSIS — E440 Moderate protein-calorie malnutrition: Secondary | ICD-10-CM | POA: Diagnosis not present

## 2021-05-24 DIAGNOSIS — N2581 Secondary hyperparathyroidism of renal origin: Secondary | ICD-10-CM | POA: Diagnosis not present

## 2021-05-24 DIAGNOSIS — Z992 Dependence on renal dialysis: Secondary | ICD-10-CM | POA: Diagnosis not present

## 2021-05-25 DIAGNOSIS — D631 Anemia in chronic kidney disease: Secondary | ICD-10-CM | POA: Diagnosis not present

## 2021-05-25 DIAGNOSIS — N2581 Secondary hyperparathyroidism of renal origin: Secondary | ICD-10-CM | POA: Diagnosis not present

## 2021-05-25 DIAGNOSIS — Z992 Dependence on renal dialysis: Secondary | ICD-10-CM | POA: Diagnosis not present

## 2021-05-25 DIAGNOSIS — D509 Iron deficiency anemia, unspecified: Secondary | ICD-10-CM | POA: Diagnosis not present

## 2021-05-25 DIAGNOSIS — N186 End stage renal disease: Secondary | ICD-10-CM | POA: Diagnosis not present

## 2021-05-25 DIAGNOSIS — E440 Moderate protein-calorie malnutrition: Secondary | ICD-10-CM | POA: Diagnosis not present

## 2021-05-26 DIAGNOSIS — D631 Anemia in chronic kidney disease: Secondary | ICD-10-CM | POA: Diagnosis not present

## 2021-05-26 DIAGNOSIS — E440 Moderate protein-calorie malnutrition: Secondary | ICD-10-CM | POA: Diagnosis not present

## 2021-05-26 DIAGNOSIS — N2581 Secondary hyperparathyroidism of renal origin: Secondary | ICD-10-CM | POA: Diagnosis not present

## 2021-05-26 DIAGNOSIS — Z992 Dependence on renal dialysis: Secondary | ICD-10-CM | POA: Diagnosis not present

## 2021-05-26 DIAGNOSIS — N186 End stage renal disease: Secondary | ICD-10-CM | POA: Diagnosis not present

## 2021-05-26 DIAGNOSIS — D509 Iron deficiency anemia, unspecified: Secondary | ICD-10-CM | POA: Diagnosis not present

## 2021-05-29 DIAGNOSIS — N186 End stage renal disease: Secondary | ICD-10-CM | POA: Diagnosis not present

## 2021-05-29 DIAGNOSIS — E440 Moderate protein-calorie malnutrition: Secondary | ICD-10-CM | POA: Diagnosis not present

## 2021-05-29 DIAGNOSIS — D631 Anemia in chronic kidney disease: Secondary | ICD-10-CM | POA: Diagnosis not present

## 2021-05-29 DIAGNOSIS — N2581 Secondary hyperparathyroidism of renal origin: Secondary | ICD-10-CM | POA: Diagnosis not present

## 2021-05-29 DIAGNOSIS — Z992 Dependence on renal dialysis: Secondary | ICD-10-CM | POA: Diagnosis not present

## 2021-05-29 DIAGNOSIS — D509 Iron deficiency anemia, unspecified: Secondary | ICD-10-CM | POA: Diagnosis not present

## 2021-05-30 DIAGNOSIS — N186 End stage renal disease: Secondary | ICD-10-CM | POA: Diagnosis not present

## 2021-05-30 DIAGNOSIS — E440 Moderate protein-calorie malnutrition: Secondary | ICD-10-CM | POA: Diagnosis not present

## 2021-05-30 DIAGNOSIS — D631 Anemia in chronic kidney disease: Secondary | ICD-10-CM | POA: Diagnosis not present

## 2021-05-30 DIAGNOSIS — D509 Iron deficiency anemia, unspecified: Secondary | ICD-10-CM | POA: Diagnosis not present

## 2021-05-30 DIAGNOSIS — Z992 Dependence on renal dialysis: Secondary | ICD-10-CM | POA: Diagnosis not present

## 2021-05-30 DIAGNOSIS — N2581 Secondary hyperparathyroidism of renal origin: Secondary | ICD-10-CM | POA: Diagnosis not present

## 2021-05-31 ENCOUNTER — Encounter: Payer: Medicare Other | Admitting: Nurse Practitioner

## 2021-05-31 NOTE — Assessment & Plan Note (Signed)
Possibly due to fluid behind TM vs BPPV.   Treat with flonase and desensitization exercise.  Normal neuro exam, no red flags.  if not improving consider further eval.

## 2021-06-01 DIAGNOSIS — D509 Iron deficiency anemia, unspecified: Secondary | ICD-10-CM | POA: Diagnosis not present

## 2021-06-01 DIAGNOSIS — N186 End stage renal disease: Secondary | ICD-10-CM | POA: Diagnosis not present

## 2021-06-01 DIAGNOSIS — N2581 Secondary hyperparathyroidism of renal origin: Secondary | ICD-10-CM | POA: Diagnosis not present

## 2021-06-01 DIAGNOSIS — Z992 Dependence on renal dialysis: Secondary | ICD-10-CM | POA: Diagnosis not present

## 2021-06-01 DIAGNOSIS — D631 Anemia in chronic kidney disease: Secondary | ICD-10-CM | POA: Diagnosis not present

## 2021-06-01 DIAGNOSIS — E440 Moderate protein-calorie malnutrition: Secondary | ICD-10-CM | POA: Diagnosis not present

## 2021-06-02 DIAGNOSIS — D509 Iron deficiency anemia, unspecified: Secondary | ICD-10-CM | POA: Diagnosis not present

## 2021-06-02 DIAGNOSIS — N2581 Secondary hyperparathyroidism of renal origin: Secondary | ICD-10-CM | POA: Diagnosis not present

## 2021-06-02 DIAGNOSIS — N186 End stage renal disease: Secondary | ICD-10-CM | POA: Diagnosis not present

## 2021-06-02 DIAGNOSIS — Z992 Dependence on renal dialysis: Secondary | ICD-10-CM | POA: Diagnosis not present

## 2021-06-02 DIAGNOSIS — D631 Anemia in chronic kidney disease: Secondary | ICD-10-CM | POA: Diagnosis not present

## 2021-06-02 DIAGNOSIS — E440 Moderate protein-calorie malnutrition: Secondary | ICD-10-CM | POA: Diagnosis not present

## 2021-06-05 DIAGNOSIS — Z992 Dependence on renal dialysis: Secondary | ICD-10-CM | POA: Diagnosis not present

## 2021-06-05 DIAGNOSIS — E440 Moderate protein-calorie malnutrition: Secondary | ICD-10-CM | POA: Diagnosis not present

## 2021-06-05 DIAGNOSIS — D509 Iron deficiency anemia, unspecified: Secondary | ICD-10-CM | POA: Diagnosis not present

## 2021-06-05 DIAGNOSIS — N2581 Secondary hyperparathyroidism of renal origin: Secondary | ICD-10-CM | POA: Diagnosis not present

## 2021-06-05 DIAGNOSIS — D631 Anemia in chronic kidney disease: Secondary | ICD-10-CM | POA: Diagnosis not present

## 2021-06-05 DIAGNOSIS — N186 End stage renal disease: Secondary | ICD-10-CM | POA: Diagnosis not present

## 2021-06-06 DIAGNOSIS — D509 Iron deficiency anemia, unspecified: Secondary | ICD-10-CM | POA: Diagnosis not present

## 2021-06-06 DIAGNOSIS — D631 Anemia in chronic kidney disease: Secondary | ICD-10-CM | POA: Diagnosis not present

## 2021-06-06 DIAGNOSIS — Z992 Dependence on renal dialysis: Secondary | ICD-10-CM | POA: Diagnosis not present

## 2021-06-06 DIAGNOSIS — E440 Moderate protein-calorie malnutrition: Secondary | ICD-10-CM | POA: Diagnosis not present

## 2021-06-06 DIAGNOSIS — N186 End stage renal disease: Secondary | ICD-10-CM | POA: Diagnosis not present

## 2021-06-06 DIAGNOSIS — N2581 Secondary hyperparathyroidism of renal origin: Secondary | ICD-10-CM | POA: Diagnosis not present

## 2021-06-08 ENCOUNTER — Encounter: Payer: Self-pay | Admitting: Nurse Practitioner

## 2021-06-08 ENCOUNTER — Other Ambulatory Visit: Payer: Self-pay

## 2021-06-08 ENCOUNTER — Ambulatory Visit (INDEPENDENT_AMBULATORY_CARE_PROVIDER_SITE_OTHER): Payer: Medicare Other | Admitting: Nurse Practitioner

## 2021-06-08 DIAGNOSIS — N028 Recurrent and persistent hematuria with other morphologic changes: Secondary | ICD-10-CM

## 2021-06-08 DIAGNOSIS — M25562 Pain in left knee: Secondary | ICD-10-CM | POA: Diagnosis not present

## 2021-06-08 DIAGNOSIS — N186 End stage renal disease: Secondary | ICD-10-CM | POA: Diagnosis not present

## 2021-06-08 DIAGNOSIS — M1A30X0 Chronic gout due to renal impairment, unspecified site, without tophus (tophi): Secondary | ICD-10-CM | POA: Diagnosis not present

## 2021-06-08 DIAGNOSIS — N02B9 Other recurrent and persistent immunoglobulin A nephropathy: Secondary | ICD-10-CM

## 2021-06-08 DIAGNOSIS — N2581 Secondary hyperparathyroidism of renal origin: Secondary | ICD-10-CM | POA: Diagnosis not present

## 2021-06-08 DIAGNOSIS — D631 Anemia in chronic kidney disease: Secondary | ICD-10-CM | POA: Diagnosis not present

## 2021-06-08 DIAGNOSIS — D509 Iron deficiency anemia, unspecified: Secondary | ICD-10-CM | POA: Diagnosis not present

## 2021-06-08 DIAGNOSIS — E440 Moderate protein-calorie malnutrition: Secondary | ICD-10-CM | POA: Diagnosis not present

## 2021-06-08 DIAGNOSIS — I151 Hypertension secondary to other renal disorders: Secondary | ICD-10-CM | POA: Diagnosis not present

## 2021-06-08 DIAGNOSIS — Z992 Dependence on renal dialysis: Secondary | ICD-10-CM | POA: Diagnosis not present

## 2021-06-08 DIAGNOSIS — M1712 Unilateral primary osteoarthritis, left knee: Secondary | ICD-10-CM | POA: Diagnosis not present

## 2021-06-08 NOTE — Assessment & Plan Note (Signed)
On allopurinol for gout.  Continue same.  Patient does not need refill.  We will check uric acid with annual exam in approximately 6 weeks.

## 2021-06-08 NOTE — Assessment & Plan Note (Addendum)
Patient followed by Dr. Vela Prose kidney.  He does hemodialysis at home 4 times nightly.  States he does Sunday, Tuesday, Wednesday, Thursday.  States in touch with nephrologist monthly and has labs drawn by nurse monthly.  He also sees [employer] once a year for updated labs as he is on the kidney transplant list.  Patient does state he weighs himself daily.  Continue weighing self daily.  Continue at home hemodialysis schedule.

## 2021-06-08 NOTE — Patient Instructions (Signed)
Nice to see you today. Will have you come back in about 6 weeks for a physical and full lab work up. See me sooner if you need it.

## 2021-06-08 NOTE — Assessment & Plan Note (Signed)
Was reasoning for renal failure.  Patient is on kidney transplant list at [facility].

## 2021-06-08 NOTE — Assessment & Plan Note (Signed)
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  States this is managed by his nephrologist.  He has to enter his blood pressure while he does hemodialysis.  States that she is watching it currently and does not want to make any medication changes.  Patient is to continue checking blood pressure as prescribed by nephrology.  Do not change medications at current time.  Patient is on amlodipine, Bystolic, furosemide.  Continue those as prescribed by nephrology

## 2021-06-08 NOTE — Progress Notes (Signed)
Established Patient Office Visit  Subjective:  Patient ID: Eric Douglas, male    DOB: July 11, 1971  Age: 50 y.o. MRN: [id number]  CC:  Chief Complaint  Patient presents with   Transitions Of Care    HPI Eric Douglas presents for Transfer of care Hemo at home: Sunday, Tuesday, Wednesday, Thursday. Dr. Clover Mealy, nephrology. IGA nephropathy  Dr [facility] every 6 months  ENT high dose steroids finished hearing back to normal  HTN: elevated in office stated that Dr. Clover Mealy wants to watch and wait.  [facility] derma: yearly exam required from [facility] for the transplant list.  Sees [facility] on the list for a potassium level  Past Medical History:  Diagnosis Date   Fibula fracture    ORIF 1990   Gout    non crystal proven    History of chickenpox    History of kidney disease    HTN (hypertension)    Nephropathy    Ig A; dx 2002. micropscopic hematuria and proteinuria   Tibia fracture    ORIF 1990   Varicocele    L testes   Venous incompetence    R leg    Past Surgical History:  Procedure Laterality Date   APPENDECTOMY  1982   AV FISTULA PLACEMENT Left 06/27/2017   Procedure: LEFT ARM ARTERIOVENOUS (AV) FISTULA CREATION;  Surgeon: Waynetta Sandy, MD;  Location: [place];  Service: Vascular;  Laterality: Left;   stab phlebectomy Left 12/17/2016   stab phlebectomy >20 incisions (left leg) by Tinnie Gens MD     Family History  Problem Relation Age of Onset   Hypertension Other        GP   Stroke Other    Diabetes Other        DM - GP    Cancer Neg Hx    Heart disease Neg Hx     Social History   Socioeconomic History   Marital status: Divorced    Spouse name: Not on file   Number of children: 2   Years of education: Not on file   Highest education level: Not on file  Occupational History   Occupation: Service Tech    Comment: Charity fundraiser  Tobacco Use   Smoking status: Never   Smokeless tobacco: Never  Vaping Use    Vaping Use: Never used  Substance and Sexual Activity   Alcohol use: No    Alcohol/week: 0.0 standard drinks   Drug use: No   Sexual activity: Yes  Other Topics Concern   Not on file  Social History Narrative   Divorced, primary custody of son; Charity fundraiser; gets regular exercise.       Pt signed designated party release and gives no access to medical records. Can leave msg on cell [phone].    Social Determinants of Health   Financial Resource Strain: Not on file  Food Insecurity: Not on file  Transportation Needs: Not on file  Physical Activity: Not on file  Stress: Not on file  Social Connections: Not on file  Intimate Partner Violence: Not on file    Outpatient Medications Prior to Visit  Medication Sig Dispense Refill   allopurinol (ZYLOPRIM) 100 MG tablet Take 100 mg by mouth daily as needed (gout).   2   amLODipine (NORVASC) 10 MG tablet Take 10 mg by mouth daily.      BYSTOLIC 10 MG tablet Take 10 mg by mouth daily.      calcium acetate (  PHOSLO) 667 MG tablet Take 1 tablet by mouth 3 (three) times daily with meals.     furosemide (LASIX) 40 MG tablet Take 40 mg by mouth every other day.      gabapentin (NEURONTIN) 100 MG capsule Take 100 mg by mouth 2 (two) times daily.     tamsulosin (FLOMAX) 0.4 MG CAPS capsule TAKE 1 CAPSULE BY MOUTH EVERY DAY 30 capsule 0   testosterone cypionate (DEPOTESTOSTERONE CYPIONATE) 200 MG/ML injection INJECT 1 ML (200 MG TOTAL) INTO THE MUSCLE EVERY 14 (FOURTEEN) DAYS. MUST SCHEDULE ANNUAL EXAM 2 mL 0   No facility-administered medications prior to visit.    No Known Allergies  ROS Review of Systems  Constitutional:  Negative for chills and fever.  Respiratory:  Positive for cough and shortness of breath (on DOE).   Cardiovascular:  Positive for leg swelling. Negative for chest pain.  Gastrointestinal:  Negative for abdominal pain, nausea and vomiting.  Skin:  Negative for color change and pallor.  Neurological:   Negative for weakness.     Objective:    Physical Exam Vitals and nursing note reviewed.  Constitutional:      Appearance: Normal appearance.  HENT:     Right Ear: Tympanic membrane, ear canal and external ear normal. There is no impacted cerumen.     Left Ear: Tympanic membrane, ear canal and external ear normal. There is no impacted cerumen.  Cardiovascular:     Rate and Rhythm: Normal rate and regular rhythm.     Heart sounds: Normal heart sounds.  Pulmonary:     Effort: Pulmonary effort is normal.     Breath sounds: Normal breath sounds.  Abdominal:     General: Bowel sounds are normal.  Musculoskeletal:     Right lower leg: Pitting Edema present.     Left lower leg: Pitting Edema present.     Comments: 1+ pitting edema bilaterally  Skin:    General: Skin is warm.  Neurological:     Mental Status: He is alert.  Psychiatric:        Mood and Affect: Mood normal.        Behavior: Behavior normal.        Thought Content: Thought content normal.        Judgment: Judgment normal.    BP (!) 154/98   Pulse 75   Temp 97.7 F (36.5 C) (Temporal)   Ht 6\' 6"  (1.981 m)   Wt (!) 324 lb (147 kg)   SpO2 97%   BMI 37.44 kg/m  Wt Readings from Last 3 Encounters:  06/08/21 (!) 324 lb (147 kg)  03/21/21 (!) 323 lb 12 oz (146.9 kg)  06/30/20 (!) 319 lb (144.7 kg)     Health Maintenance Due  Topic Date Due   URINE MICROALBUMIN  Never done   Hepatitis C Screening  Never done   Zoster Vaccines- Shingrix (1 of 2) Never done   Pneumococcal Vaccine 52-58 Years old (2 - PPSV23 or PCV20) 12/20/2018   COVID-19 Vaccine (3 - Pfizer risk series) 02/16/2020   INFLUENZA VACCINE  05/15/2021    There are no preventive care reminders to display for this patient.  Lab Results  Component Value Date   TSH 0.98 05/03/2016   Lab Results  Component Value Date   WBC 11.1 (H) 10/27/2019   HGB 10.5 (L) 10/27/2019   HCT 31.7 (L) 10/27/2019   MCV 101.3 (H) 10/27/2019   PLT 136 (L)  10/27/2019   Lab Results  Component Value Date   NA 131 (L) 10/27/2019   K 5.0 10/27/2019   CO2 21 (L) 10/27/2019   GLUCOSE 89 10/27/2019   BUN 83 (H) 10/27/2019   CREATININE 19.31 (H) 10/27/2019   BILITOT 0.6 10/27/2019   ALKPHOS 45 10/27/2019   AST 10 (L) 10/27/2019   ALT 16 10/27/2019   PROT 7.0 10/27/2019   ALBUMIN 3.7 10/27/2019   CALCIUM 8.8 (L) 10/27/2019   ANIONGAP 16 (H) 10/27/2019   GFR 10.37 (LL) 06/07/2017   Lab Results  Component Value Date   CHOL 114 06/07/2017   Lab Results  Component Value Date   HDL 29.90 (L) 06/07/2017   Lab Results  Component Value Date   LDLCALC 65 06/07/2017   Lab Results  Component Value Date   TRIG 99.0 06/07/2017   Lab Results  Component Value Date   CHOLHDL 4 06/07/2017   Lab Results  Component Value Date   HGBA1C 5.1 06/07/2017      Assessment & Plan:   Problem List Items Addressed This Visit       Cardiovascular and Mediastinum   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  States this is managed by his nephrologist.  He has to enter his blood pressure while he does hemodialysis.  States that she is watching it currently and does not want to make any medication changes.  Patient is to continue checking blood pressure as prescribed by nephrology.  Do not change medications at current time.  Patient is on amlodipine, Bystolic, furosemide.  Continue those as prescribed by nephrology        Genitourinary   IgA nephropathy    Was reasoning for renal failure.  Patient is on kidney transplant list at [facility].      ESRD (end stage renal disease) [facility])    Patient followed by Dr. Vela Prose kidney.  He does hemodialysis at home 4 times nightly.  States he does Sunday, Tuesday, Wednesday, Thursday.  States in touch with nephrologist monthly and has labs drawn by nurse monthly.  He also sees [employer] once a year for updated labs as he is on the kidney  transplant list.  Patient does state he weighs himself daily.  Continue weighing self daily.  Continue at home hemodialysis schedule.        Other   Gout    On allopurinol for gout.  Continue same.  Patient does not need refill.  We will check uric acid with annual exam in approximately 6 weeks.       No orders of the defined types were placed in this encounter.   Follow-up: Return in about 6 weeks (around 07/20/2021) for CPE and Labs.   This visit occurred during the SARS-CoV-2 public health emergency.  Safety protocols were in place, including screening questions prior to the visit, additional usage of staff PPE, and extensive cleaning of exam room while observing appropriate contact time as indicated for disinfecting solutions.   Romilda Garret, NP

## 2021-06-09 DIAGNOSIS — N2581 Secondary hyperparathyroidism of renal origin: Secondary | ICD-10-CM | POA: Diagnosis not present

## 2021-06-09 DIAGNOSIS — D631 Anemia in chronic kidney disease: Secondary | ICD-10-CM | POA: Diagnosis not present

## 2021-06-09 DIAGNOSIS — D509 Iron deficiency anemia, unspecified: Secondary | ICD-10-CM | POA: Diagnosis not present

## 2021-06-09 DIAGNOSIS — Z992 Dependence on renal dialysis: Secondary | ICD-10-CM | POA: Diagnosis not present

## 2021-06-09 DIAGNOSIS — E440 Moderate protein-calorie malnutrition: Secondary | ICD-10-CM | POA: Diagnosis not present

## 2021-06-09 DIAGNOSIS — N186 End stage renal disease: Secondary | ICD-10-CM | POA: Diagnosis not present

## 2021-06-12 DIAGNOSIS — E440 Moderate protein-calorie malnutrition: Secondary | ICD-10-CM | POA: Diagnosis not present

## 2021-06-12 DIAGNOSIS — Z992 Dependence on renal dialysis: Secondary | ICD-10-CM | POA: Diagnosis not present

## 2021-06-12 DIAGNOSIS — D509 Iron deficiency anemia, unspecified: Secondary | ICD-10-CM | POA: Diagnosis not present

## 2021-06-12 DIAGNOSIS — N2581 Secondary hyperparathyroidism of renal origin: Secondary | ICD-10-CM | POA: Diagnosis not present

## 2021-06-12 DIAGNOSIS — D631 Anemia in chronic kidney disease: Secondary | ICD-10-CM | POA: Diagnosis not present

## 2021-06-12 DIAGNOSIS — N186 End stage renal disease: Secondary | ICD-10-CM | POA: Diagnosis not present

## 2021-06-13 DIAGNOSIS — E440 Moderate protein-calorie malnutrition: Secondary | ICD-10-CM | POA: Diagnosis not present

## 2021-06-13 DIAGNOSIS — D509 Iron deficiency anemia, unspecified: Secondary | ICD-10-CM | POA: Diagnosis not present

## 2021-06-13 DIAGNOSIS — N186 End stage renal disease: Secondary | ICD-10-CM | POA: Diagnosis not present

## 2021-06-13 DIAGNOSIS — J3489 Other specified disorders of nose and nasal sinuses: Secondary | ICD-10-CM | POA: Diagnosis not present

## 2021-06-13 DIAGNOSIS — N2581 Secondary hyperparathyroidism of renal origin: Secondary | ICD-10-CM | POA: Diagnosis not present

## 2021-06-13 DIAGNOSIS — Z992 Dependence on renal dialysis: Secondary | ICD-10-CM | POA: Diagnosis not present

## 2021-06-13 DIAGNOSIS — H903 Sensorineural hearing loss, bilateral: Secondary | ICD-10-CM | POA: Diagnosis not present

## 2021-06-13 DIAGNOSIS — D631 Anemia in chronic kidney disease: Secondary | ICD-10-CM | POA: Diagnosis not present

## 2021-06-15 DIAGNOSIS — Z992 Dependence on renal dialysis: Secondary | ICD-10-CM | POA: Diagnosis not present

## 2021-06-15 DIAGNOSIS — N2581 Secondary hyperparathyroidism of renal origin: Secondary | ICD-10-CM | POA: Diagnosis not present

## 2021-06-15 DIAGNOSIS — D631 Anemia in chronic kidney disease: Secondary | ICD-10-CM | POA: Diagnosis not present

## 2021-06-15 DIAGNOSIS — I120 Hypertensive chronic kidney disease with stage 5 chronic kidney disease or end stage renal disease: Secondary | ICD-10-CM | POA: Diagnosis not present

## 2021-06-15 DIAGNOSIS — D509 Iron deficiency anemia, unspecified: Secondary | ICD-10-CM | POA: Diagnosis not present

## 2021-06-15 DIAGNOSIS — N2589 Other disorders resulting from impaired renal tubular function: Secondary | ICD-10-CM | POA: Diagnosis not present

## 2021-06-15 DIAGNOSIS — N186 End stage renal disease: Secondary | ICD-10-CM | POA: Diagnosis not present

## 2021-06-16 DIAGNOSIS — D631 Anemia in chronic kidney disease: Secondary | ICD-10-CM | POA: Diagnosis not present

## 2021-06-16 DIAGNOSIS — N186 End stage renal disease: Secondary | ICD-10-CM | POA: Diagnosis not present

## 2021-06-16 DIAGNOSIS — N2589 Other disorders resulting from impaired renal tubular function: Secondary | ICD-10-CM | POA: Diagnosis not present

## 2021-06-16 DIAGNOSIS — D509 Iron deficiency anemia, unspecified: Secondary | ICD-10-CM | POA: Diagnosis not present

## 2021-06-16 DIAGNOSIS — Z992 Dependence on renal dialysis: Secondary | ICD-10-CM | POA: Diagnosis not present

## 2021-06-16 DIAGNOSIS — N2581 Secondary hyperparathyroidism of renal origin: Secondary | ICD-10-CM | POA: Diagnosis not present

## 2021-06-19 DIAGNOSIS — N186 End stage renal disease: Secondary | ICD-10-CM | POA: Diagnosis not present

## 2021-06-19 DIAGNOSIS — D509 Iron deficiency anemia, unspecified: Secondary | ICD-10-CM | POA: Diagnosis not present

## 2021-06-19 DIAGNOSIS — N2589 Other disorders resulting from impaired renal tubular function: Secondary | ICD-10-CM | POA: Diagnosis not present

## 2021-06-19 DIAGNOSIS — D631 Anemia in chronic kidney disease: Secondary | ICD-10-CM | POA: Diagnosis not present

## 2021-06-19 DIAGNOSIS — N2581 Secondary hyperparathyroidism of renal origin: Secondary | ICD-10-CM | POA: Diagnosis not present

## 2021-06-19 DIAGNOSIS — Z992 Dependence on renal dialysis: Secondary | ICD-10-CM | POA: Diagnosis not present

## 2021-06-20 DIAGNOSIS — N186 End stage renal disease: Secondary | ICD-10-CM | POA: Diagnosis not present

## 2021-06-20 DIAGNOSIS — N2589 Other disorders resulting from impaired renal tubular function: Secondary | ICD-10-CM | POA: Diagnosis not present

## 2021-06-20 DIAGNOSIS — D631 Anemia in chronic kidney disease: Secondary | ICD-10-CM | POA: Diagnosis not present

## 2021-06-20 DIAGNOSIS — D509 Iron deficiency anemia, unspecified: Secondary | ICD-10-CM | POA: Diagnosis not present

## 2021-06-20 DIAGNOSIS — N2581 Secondary hyperparathyroidism of renal origin: Secondary | ICD-10-CM | POA: Diagnosis not present

## 2021-06-20 DIAGNOSIS — Z992 Dependence on renal dialysis: Secondary | ICD-10-CM | POA: Diagnosis not present

## 2021-06-22 DIAGNOSIS — Z992 Dependence on renal dialysis: Secondary | ICD-10-CM | POA: Diagnosis not present

## 2021-06-22 DIAGNOSIS — N2589 Other disorders resulting from impaired renal tubular function: Secondary | ICD-10-CM | POA: Diagnosis not present

## 2021-06-22 DIAGNOSIS — D509 Iron deficiency anemia, unspecified: Secondary | ICD-10-CM | POA: Diagnosis not present

## 2021-06-22 DIAGNOSIS — D631 Anemia in chronic kidney disease: Secondary | ICD-10-CM | POA: Diagnosis not present

## 2021-06-22 DIAGNOSIS — N2581 Secondary hyperparathyroidism of renal origin: Secondary | ICD-10-CM | POA: Diagnosis not present

## 2021-06-22 DIAGNOSIS — N186 End stage renal disease: Secondary | ICD-10-CM | POA: Diagnosis not present

## 2021-06-23 DIAGNOSIS — D509 Iron deficiency anemia, unspecified: Secondary | ICD-10-CM | POA: Diagnosis not present

## 2021-06-23 DIAGNOSIS — N186 End stage renal disease: Secondary | ICD-10-CM | POA: Diagnosis not present

## 2021-06-23 DIAGNOSIS — N2581 Secondary hyperparathyroidism of renal origin: Secondary | ICD-10-CM | POA: Diagnosis not present

## 2021-06-23 DIAGNOSIS — D631 Anemia in chronic kidney disease: Secondary | ICD-10-CM | POA: Diagnosis not present

## 2021-06-23 DIAGNOSIS — N2589 Other disorders resulting from impaired renal tubular function: Secondary | ICD-10-CM | POA: Diagnosis not present

## 2021-06-23 DIAGNOSIS — Z992 Dependence on renal dialysis: Secondary | ICD-10-CM | POA: Diagnosis not present

## 2021-06-26 DIAGNOSIS — N2589 Other disorders resulting from impaired renal tubular function: Secondary | ICD-10-CM | POA: Diagnosis not present

## 2021-06-26 DIAGNOSIS — N2581 Secondary hyperparathyroidism of renal origin: Secondary | ICD-10-CM | POA: Diagnosis not present

## 2021-06-26 DIAGNOSIS — D631 Anemia in chronic kidney disease: Secondary | ICD-10-CM | POA: Diagnosis not present

## 2021-06-26 DIAGNOSIS — N186 End stage renal disease: Secondary | ICD-10-CM | POA: Diagnosis not present

## 2021-06-26 DIAGNOSIS — Z992 Dependence on renal dialysis: Secondary | ICD-10-CM | POA: Diagnosis not present

## 2021-06-26 DIAGNOSIS — D509 Iron deficiency anemia, unspecified: Secondary | ICD-10-CM | POA: Diagnosis not present

## 2021-06-27 DIAGNOSIS — D509 Iron deficiency anemia, unspecified: Secondary | ICD-10-CM | POA: Diagnosis not present

## 2021-06-27 DIAGNOSIS — M1040 Other secondary gout, unspecified site: Secondary | ICD-10-CM | POA: Diagnosis not present

## 2021-06-27 DIAGNOSIS — D631 Anemia in chronic kidney disease: Secondary | ICD-10-CM | POA: Diagnosis not present

## 2021-06-27 DIAGNOSIS — N2589 Other disorders resulting from impaired renal tubular function: Secondary | ICD-10-CM | POA: Diagnosis not present

## 2021-06-27 DIAGNOSIS — N186 End stage renal disease: Secondary | ICD-10-CM | POA: Diagnosis not present

## 2021-06-27 DIAGNOSIS — N2581 Secondary hyperparathyroidism of renal origin: Secondary | ICD-10-CM | POA: Diagnosis not present

## 2021-06-27 DIAGNOSIS — Z992 Dependence on renal dialysis: Secondary | ICD-10-CM | POA: Diagnosis not present

## 2021-06-29 DIAGNOSIS — N2589 Other disorders resulting from impaired renal tubular function: Secondary | ICD-10-CM | POA: Diagnosis not present

## 2021-06-29 DIAGNOSIS — Z992 Dependence on renal dialysis: Secondary | ICD-10-CM | POA: Diagnosis not present

## 2021-06-29 DIAGNOSIS — N186 End stage renal disease: Secondary | ICD-10-CM | POA: Diagnosis not present

## 2021-06-29 DIAGNOSIS — D509 Iron deficiency anemia, unspecified: Secondary | ICD-10-CM | POA: Diagnosis not present

## 2021-06-29 DIAGNOSIS — N2581 Secondary hyperparathyroidism of renal origin: Secondary | ICD-10-CM | POA: Diagnosis not present

## 2021-06-29 DIAGNOSIS — D631 Anemia in chronic kidney disease: Secondary | ICD-10-CM | POA: Diagnosis not present

## 2021-06-30 DIAGNOSIS — N186 End stage renal disease: Secondary | ICD-10-CM | POA: Diagnosis not present

## 2021-06-30 DIAGNOSIS — N2589 Other disorders resulting from impaired renal tubular function: Secondary | ICD-10-CM | POA: Diagnosis not present

## 2021-06-30 DIAGNOSIS — D631 Anemia in chronic kidney disease: Secondary | ICD-10-CM | POA: Diagnosis not present

## 2021-06-30 DIAGNOSIS — Z992 Dependence on renal dialysis: Secondary | ICD-10-CM | POA: Diagnosis not present

## 2021-06-30 DIAGNOSIS — D509 Iron deficiency anemia, unspecified: Secondary | ICD-10-CM | POA: Diagnosis not present

## 2021-06-30 DIAGNOSIS — N2581 Secondary hyperparathyroidism of renal origin: Secondary | ICD-10-CM | POA: Diagnosis not present

## 2021-07-03 DIAGNOSIS — D631 Anemia in chronic kidney disease: Secondary | ICD-10-CM | POA: Diagnosis not present

## 2021-07-03 DIAGNOSIS — N2589 Other disorders resulting from impaired renal tubular function: Secondary | ICD-10-CM | POA: Diagnosis not present

## 2021-07-03 DIAGNOSIS — Z992 Dependence on renal dialysis: Secondary | ICD-10-CM | POA: Diagnosis not present

## 2021-07-03 DIAGNOSIS — N186 End stage renal disease: Secondary | ICD-10-CM | POA: Diagnosis not present

## 2021-07-03 DIAGNOSIS — N2581 Secondary hyperparathyroidism of renal origin: Secondary | ICD-10-CM | POA: Diagnosis not present

## 2021-07-03 DIAGNOSIS — D509 Iron deficiency anemia, unspecified: Secondary | ICD-10-CM | POA: Diagnosis not present

## 2021-07-04 DIAGNOSIS — D509 Iron deficiency anemia, unspecified: Secondary | ICD-10-CM | POA: Diagnosis not present

## 2021-07-04 DIAGNOSIS — N2581 Secondary hyperparathyroidism of renal origin: Secondary | ICD-10-CM | POA: Diagnosis not present

## 2021-07-04 DIAGNOSIS — Z992 Dependence on renal dialysis: Secondary | ICD-10-CM | POA: Diagnosis not present

## 2021-07-04 DIAGNOSIS — N2589 Other disorders resulting from impaired renal tubular function: Secondary | ICD-10-CM | POA: Diagnosis not present

## 2021-07-04 DIAGNOSIS — D631 Anemia in chronic kidney disease: Secondary | ICD-10-CM | POA: Diagnosis not present

## 2021-07-04 DIAGNOSIS — N186 End stage renal disease: Secondary | ICD-10-CM | POA: Diagnosis not present

## 2021-07-06 DIAGNOSIS — D631 Anemia in chronic kidney disease: Secondary | ICD-10-CM | POA: Diagnosis not present

## 2021-07-06 DIAGNOSIS — D509 Iron deficiency anemia, unspecified: Secondary | ICD-10-CM | POA: Diagnosis not present

## 2021-07-06 DIAGNOSIS — N186 End stage renal disease: Secondary | ICD-10-CM | POA: Diagnosis not present

## 2021-07-06 DIAGNOSIS — N2589 Other disorders resulting from impaired renal tubular function: Secondary | ICD-10-CM | POA: Diagnosis not present

## 2021-07-06 DIAGNOSIS — N2581 Secondary hyperparathyroidism of renal origin: Secondary | ICD-10-CM | POA: Diagnosis not present

## 2021-07-06 DIAGNOSIS — Z992 Dependence on renal dialysis: Secondary | ICD-10-CM | POA: Diagnosis not present

## 2021-07-07 DIAGNOSIS — Z992 Dependence on renal dialysis: Secondary | ICD-10-CM | POA: Diagnosis not present

## 2021-07-07 DIAGNOSIS — D509 Iron deficiency anemia, unspecified: Secondary | ICD-10-CM | POA: Diagnosis not present

## 2021-07-07 DIAGNOSIS — D631 Anemia in chronic kidney disease: Secondary | ICD-10-CM | POA: Diagnosis not present

## 2021-07-07 DIAGNOSIS — N2589 Other disorders resulting from impaired renal tubular function: Secondary | ICD-10-CM | POA: Diagnosis not present

## 2021-07-07 DIAGNOSIS — N2581 Secondary hyperparathyroidism of renal origin: Secondary | ICD-10-CM | POA: Diagnosis not present

## 2021-07-07 DIAGNOSIS — N186 End stage renal disease: Secondary | ICD-10-CM | POA: Diagnosis not present

## 2021-07-10 DIAGNOSIS — D631 Anemia in chronic kidney disease: Secondary | ICD-10-CM | POA: Diagnosis not present

## 2021-07-10 DIAGNOSIS — D509 Iron deficiency anemia, unspecified: Secondary | ICD-10-CM | POA: Diagnosis not present

## 2021-07-10 DIAGNOSIS — Z992 Dependence on renal dialysis: Secondary | ICD-10-CM | POA: Diagnosis not present

## 2021-07-10 DIAGNOSIS — N186 End stage renal disease: Secondary | ICD-10-CM | POA: Diagnosis not present

## 2021-07-10 DIAGNOSIS — N2589 Other disorders resulting from impaired renal tubular function: Secondary | ICD-10-CM | POA: Diagnosis not present

## 2021-07-10 DIAGNOSIS — N2581 Secondary hyperparathyroidism of renal origin: Secondary | ICD-10-CM | POA: Diagnosis not present

## 2021-07-11 DIAGNOSIS — D509 Iron deficiency anemia, unspecified: Secondary | ICD-10-CM | POA: Diagnosis not present

## 2021-07-11 DIAGNOSIS — N2581 Secondary hyperparathyroidism of renal origin: Secondary | ICD-10-CM | POA: Diagnosis not present

## 2021-07-11 DIAGNOSIS — N2589 Other disorders resulting from impaired renal tubular function: Secondary | ICD-10-CM | POA: Diagnosis not present

## 2021-07-11 DIAGNOSIS — D631 Anemia in chronic kidney disease: Secondary | ICD-10-CM | POA: Diagnosis not present

## 2021-07-11 DIAGNOSIS — Z992 Dependence on renal dialysis: Secondary | ICD-10-CM | POA: Diagnosis not present

## 2021-07-11 DIAGNOSIS — N186 End stage renal disease: Secondary | ICD-10-CM | POA: Diagnosis not present

## 2021-07-12 DIAGNOSIS — N2589 Other disorders resulting from impaired renal tubular function: Secondary | ICD-10-CM | POA: Diagnosis not present

## 2021-07-12 DIAGNOSIS — Z992 Dependence on renal dialysis: Secondary | ICD-10-CM | POA: Diagnosis not present

## 2021-07-12 DIAGNOSIS — N186 End stage renal disease: Secondary | ICD-10-CM | POA: Diagnosis not present

## 2021-07-12 DIAGNOSIS — D631 Anemia in chronic kidney disease: Secondary | ICD-10-CM | POA: Diagnosis not present

## 2021-07-12 DIAGNOSIS — N2581 Secondary hyperparathyroidism of renal origin: Secondary | ICD-10-CM | POA: Diagnosis not present

## 2021-07-12 DIAGNOSIS — D509 Iron deficiency anemia, unspecified: Secondary | ICD-10-CM | POA: Diagnosis not present

## 2021-07-13 DIAGNOSIS — D631 Anemia in chronic kidney disease: Secondary | ICD-10-CM | POA: Diagnosis not present

## 2021-07-13 DIAGNOSIS — Z992 Dependence on renal dialysis: Secondary | ICD-10-CM | POA: Diagnosis not present

## 2021-07-13 DIAGNOSIS — N2589 Other disorders resulting from impaired renal tubular function: Secondary | ICD-10-CM | POA: Diagnosis not present

## 2021-07-13 DIAGNOSIS — D509 Iron deficiency anemia, unspecified: Secondary | ICD-10-CM | POA: Diagnosis not present

## 2021-07-13 DIAGNOSIS — N186 End stage renal disease: Secondary | ICD-10-CM | POA: Diagnosis not present

## 2021-07-13 DIAGNOSIS — N2581 Secondary hyperparathyroidism of renal origin: Secondary | ICD-10-CM | POA: Diagnosis not present

## 2021-07-14 DIAGNOSIS — D509 Iron deficiency anemia, unspecified: Secondary | ICD-10-CM | POA: Diagnosis not present

## 2021-07-14 DIAGNOSIS — D631 Anemia in chronic kidney disease: Secondary | ICD-10-CM | POA: Diagnosis not present

## 2021-07-14 DIAGNOSIS — N2581 Secondary hyperparathyroidism of renal origin: Secondary | ICD-10-CM | POA: Diagnosis not present

## 2021-07-14 DIAGNOSIS — Z992 Dependence on renal dialysis: Secondary | ICD-10-CM | POA: Diagnosis not present

## 2021-07-14 DIAGNOSIS — N2589 Other disorders resulting from impaired renal tubular function: Secondary | ICD-10-CM | POA: Diagnosis not present

## 2021-07-14 DIAGNOSIS — N186 End stage renal disease: Secondary | ICD-10-CM | POA: Diagnosis not present

## 2021-07-15 DIAGNOSIS — N028 Recurrent and persistent hematuria with other morphologic changes: Secondary | ICD-10-CM | POA: Diagnosis not present

## 2021-07-15 DIAGNOSIS — Z992 Dependence on renal dialysis: Secondary | ICD-10-CM | POA: Diagnosis not present

## 2021-07-15 DIAGNOSIS — N186 End stage renal disease: Secondary | ICD-10-CM | POA: Diagnosis not present

## 2021-07-17 DIAGNOSIS — Z79899 Other long term (current) drug therapy: Secondary | ICD-10-CM | POA: Diagnosis not present

## 2021-07-17 DIAGNOSIS — Z992 Dependence on renal dialysis: Secondary | ICD-10-CM | POA: Diagnosis not present

## 2021-07-17 DIAGNOSIS — D631 Anemia in chronic kidney disease: Secondary | ICD-10-CM | POA: Diagnosis not present

## 2021-07-17 DIAGNOSIS — N186 End stage renal disease: Secondary | ICD-10-CM | POA: Diagnosis not present

## 2021-07-17 DIAGNOSIS — D509 Iron deficiency anemia, unspecified: Secondary | ICD-10-CM | POA: Diagnosis not present

## 2021-07-17 DIAGNOSIS — I120 Hypertensive chronic kidney disease with stage 5 chronic kidney disease or end stage renal disease: Secondary | ICD-10-CM | POA: Diagnosis not present

## 2021-07-17 DIAGNOSIS — N2581 Secondary hyperparathyroidism of renal origin: Secondary | ICD-10-CM | POA: Diagnosis not present

## 2021-07-17 DIAGNOSIS — E440 Moderate protein-calorie malnutrition: Secondary | ICD-10-CM | POA: Diagnosis not present

## 2021-07-18 DIAGNOSIS — N186 End stage renal disease: Secondary | ICD-10-CM | POA: Diagnosis not present

## 2021-07-18 DIAGNOSIS — Z79899 Other long term (current) drug therapy: Secondary | ICD-10-CM | POA: Diagnosis not present

## 2021-07-18 DIAGNOSIS — Z992 Dependence on renal dialysis: Secondary | ICD-10-CM | POA: Diagnosis not present

## 2021-07-18 DIAGNOSIS — E440 Moderate protein-calorie malnutrition: Secondary | ICD-10-CM | POA: Diagnosis not present

## 2021-07-18 DIAGNOSIS — N2581 Secondary hyperparathyroidism of renal origin: Secondary | ICD-10-CM | POA: Diagnosis not present

## 2021-07-18 DIAGNOSIS — D631 Anemia in chronic kidney disease: Secondary | ICD-10-CM | POA: Diagnosis not present

## 2021-07-20 ENCOUNTER — Encounter: Payer: Self-pay | Admitting: Nurse Practitioner

## 2021-07-20 ENCOUNTER — Ambulatory Visit (INDEPENDENT_AMBULATORY_CARE_PROVIDER_SITE_OTHER): Payer: Medicare Other

## 2021-07-20 ENCOUNTER — Ambulatory Visit (INDEPENDENT_AMBULATORY_CARE_PROVIDER_SITE_OTHER): Payer: Medicare Other | Admitting: Nurse Practitioner

## 2021-07-20 ENCOUNTER — Telehealth: Payer: Self-pay | Admitting: *Deleted

## 2021-07-20 ENCOUNTER — Other Ambulatory Visit: Payer: Self-pay

## 2021-07-20 VITALS — BP 162/104 | HR 70 | Temp 98.2°F | Resp 14 | Ht 78.0 in | Wt 326.2 lb

## 2021-07-20 DIAGNOSIS — E66812 Obesity, class 2: Secondary | ICD-10-CM | POA: Insufficient documentation

## 2021-07-20 DIAGNOSIS — Z6837 Body mass index (BMI) 37.0-37.9, adult: Secondary | ICD-10-CM

## 2021-07-20 DIAGNOSIS — E440 Moderate protein-calorie malnutrition: Secondary | ICD-10-CM | POA: Diagnosis not present

## 2021-07-20 DIAGNOSIS — R0600 Dyspnea, unspecified: Secondary | ICD-10-CM | POA: Diagnosis not present

## 2021-07-20 DIAGNOSIS — N2581 Secondary hyperparathyroidism of renal origin: Secondary | ICD-10-CM | POA: Diagnosis not present

## 2021-07-20 DIAGNOSIS — I151 Hypertension secondary to other renal disorders: Secondary | ICD-10-CM

## 2021-07-20 DIAGNOSIS — D631 Anemia in chronic kidney disease: Secondary | ICD-10-CM | POA: Diagnosis not present

## 2021-07-20 DIAGNOSIS — R0609 Other forms of dyspnea: Secondary | ICD-10-CM | POA: Diagnosis not present

## 2021-07-20 DIAGNOSIS — Z79899 Other long term (current) drug therapy: Secondary | ICD-10-CM | POA: Diagnosis not present

## 2021-07-20 DIAGNOSIS — Z992 Dependence on renal dialysis: Secondary | ICD-10-CM | POA: Diagnosis not present

## 2021-07-20 DIAGNOSIS — Z0000 Encounter for general adult medical examination without abnormal findings: Secondary | ICD-10-CM | POA: Diagnosis not present

## 2021-07-20 DIAGNOSIS — N186 End stage renal disease: Secondary | ICD-10-CM

## 2021-07-20 DIAGNOSIS — Z23 Encounter for immunization: Secondary | ICD-10-CM | POA: Diagnosis not present

## 2021-07-20 DIAGNOSIS — R5383 Other fatigue: Secondary | ICD-10-CM | POA: Diagnosis not present

## 2021-07-20 LAB — TSH: TSH: 2.28 u[IU]/mL (ref 0.35–5.50)

## 2021-07-20 LAB — COMPREHENSIVE METABOLIC PANEL
ALT: 17 U/L (ref 0–53)
AST: 12 U/L (ref 0–37)
Albumin: 4.3 g/dL (ref 3.5–5.2)
Alkaline Phosphatase: 54 U/L (ref 39–117)
BUN: 41 mg/dL — ABNORMAL HIGH (ref 6–23)
CO2: 30 mEq/L (ref 19–32)
Calcium: 9.4 mg/dL (ref 8.4–10.5)
Chloride: 99 mEq/L (ref 96–112)
Creatinine, Ser: 11.2 mg/dL (ref 0.40–1.50)
GFR: 4.81 mL/min — CL (ref >60.00)
Glucose, Bld: 81 mg/dL (ref 70–99)
Potassium: 4.8 mEq/L (ref 3.5–5.1)
Sodium: 138 mEq/L (ref 135–145)
Total Bilirubin: 0.5 mg/dL (ref 0.2–1.2)
Total Protein: 6.3 g/dL (ref 6.0–8.3)

## 2021-07-20 LAB — LIPID PANEL
Cholesterol: 116 mg/dL (ref 0–200)
HDL: 40.3 mg/dL (ref >39.00)
LDL Cholesterol: 64 mg/dL (ref 0–99)
NonHDL: 75.54
Total CHOL/HDL Ratio: 3
Triglycerides: 56 mg/dL (ref 0.0–149.0)
VLDL: 11.2 mg/dL (ref 0.0–40.0)

## 2021-07-20 LAB — CBC
HCT: 25.9 % — ABNORMAL LOW (ref 39.0–52.0)
Hemoglobin: 8.9 g/dL — ABNORMAL LOW (ref 13.0–17.0)
MCHC: 34.2 g/dL (ref 30.0–36.0)
MCV: 93.8 fl (ref 78.0–100.0)
Platelets: 131 10*3/uL — ABNORMAL LOW (ref 150.0–400.0)
RBC: 2.76 Mil/uL — ABNORMAL LOW (ref 4.22–5.81)
RDW: 14.5 % (ref 11.5–15.5)
WBC: 5.2 10*3/uL (ref 4.0–10.5)

## 2021-07-20 LAB — HEMOGLOBIN A1C: Hgb A1c MFr Bld: 5.3 % (ref 4.6–6.5)

## 2021-07-20 NOTE — Assessment & Plan Note (Addendum)
Intermittent since his second bout with COVID.  States some days it is hard for him to walk across his yard to get to work.  Other days he does not feel quite so much is dialysis patient.  Does have 1+ pitting edema.  Close to dry weight.  Lungs clear to auscultation on exam will obtain a chest x-ray.  States that sometimes he has difficulty lying flat and sleeping also do think this is fluid related

## 2021-07-20 NOTE — Progress Notes (Signed)
Established Patient Office Visit  Subjective:  Patient ID: Eric Douglas, male    DOB: [birth year]  Age: 50 y.o. MRN: [id number]  CC:  Chief Complaint  Patient presents with   Annual Exam   HPI Eric Douglas presents for Annual wellness visit  Dialysis: still passes some urine. 4-5 oz daily. Does at home Hemodialysis 4-5 times a week. Checks vitals as he does it. Sees nephrology monthly. Also sees urology twice a year.  DOE. Does weigh at home. 140kg or 308lb dry weight.  Was 315 on at home scale. Did dialyze last night. States he is a little up from his ideal dry weight. He admits that is goes in cycles that the further into the week and dialysis treatments he gets he gets closer to his dry weight. He does weigh daily at home.  HTN: 150s/90s when he checks his BP at home. States towards the end of his treatments he is lower 299B systolic.   Reviewed paperwork with patient and sent it to be scanned  Past Medical History:  Diagnosis Date   Fibula fracture    ORIF 1990   Gout    non crystal proven    History of chickenpox    History of kidney disease    HTN (hypertension)    Nephropathy    Ig A; dx 2002. micropscopic hematuria and proteinuria   Tibia fracture    ORIF 1990   Varicocele    L testes   Venous incompetence    R leg    Past Surgical History:  Procedure Laterality Date   APPENDECTOMY  1982   AV FISTULA PLACEMENT Left 06/27/2017   Procedure: LEFT ARM ARTERIOVENOUS (AV) FISTULA CREATION;  Surgeon: Waynetta Sandy, MD;  Location: [place];  Service: Vascular;  Laterality: Left;   stab phlebectomy Left 12/17/2016   stab phlebectomy >20 incisions (left leg) by Tinnie Gens MD     Family History  Problem Relation Age of Onset   Hypertension Other        GP   Stroke Other    Diabetes Other        DM - GP    Cancer Neg Hx    Heart disease Neg Hx     Social History   Socioeconomic History   Marital status: Divorced    Spouse name:  Not on file   Number of children: 2   Years of education: Not on file   Highest education level: Not on file  Occupational History   Occupation: Service Tech    Comment: Charity fundraiser  Tobacco Use   Smoking status: Never   Smokeless tobacco: Never  Vaping Use   Vaping Use: Never used  Substance and Sexual Activity   Alcohol use: No    Alcohol/week: 0.0 standard drinks   Drug use: No   Sexual activity: Yes  Other Topics Concern   Not on file  Social History Narrative   Divorced, primary custody of son; Charity fundraiser; gets regular exercise.       Pt signed designated party release and gives no access to medical records. Can leave msg on cell [phone].    Social Determinants of Health   Financial Resource Strain: Not on file  Food Insecurity: Not on file  Transportation Needs: Not on file  Physical Activity: Not on file  Stress: Not on file  Social Connections: Not on file  Intimate Partner Violence: Not on file    Outpatient Medications  Prior to Visit  Medication Sig Dispense Refill   allopurinol (ZYLOPRIM) 100 MG tablet Take 100 mg by mouth daily as needed (gout).   2   amLODipine (NORVASC) 10 MG tablet Take 10 mg by mouth daily.      BYSTOLIC 10 MG tablet Take 10 mg by mouth daily.      calcium acetate (PHOSLO) 667 MG tablet Take 1 tablet by mouth 3 (three) times daily with meals.     furosemide (LASIX) 40 MG tablet Take 40 mg by mouth every other day.      gabapentin (NEURONTIN) 100 MG capsule Take 100 mg by mouth 2 (two) times daily.     tamsulosin (FLOMAX) 0.4 MG CAPS capsule TAKE 1 CAPSULE BY MOUTH EVERY DAY 30 capsule 0   testosterone cypionate (DEPOTESTOSTERONE CYPIONATE) 200 MG/ML injection INJECT 1 ML (200 MG TOTAL) INTO THE MUSCLE EVERY 14 (FOURTEEN) DAYS. MUST SCHEDULE ANNUAL EXAM 2 mL 0   No facility-administered medications prior to visit.    No Known Allergies  ROS Review of Systems  Constitutional:  Positive for fatigue. Negative for  chills and fever.  Respiratory:  Positive for shortness of breath (DOE). Negative for cough.   Cardiovascular:  Positive for leg swelling. Negative for chest pain.  Gastrointestinal:  Negative for abdominal pain, blood in stool, constipation, diarrhea, nausea and vomiting.  Genitourinary:  Negative for penile pain and penile swelling.  Neurological:  Negative for dizziness, light-headedness and headaches.  Psychiatric/Behavioral:  Negative for hallucinations and suicidal ideas.      Objective:    Physical Exam Vitals and nursing note reviewed.  Constitutional:      Appearance: He is obese.  HENT:     Right Ear: Tympanic membrane, ear canal and external ear normal. There is no impacted cerumen.     Left Ear: Tympanic membrane, ear canal and external ear normal. There is no impacted cerumen.     Mouth/Throat:     Mouth: Mucous membranes are moist.     Pharynx: Oropharynx is clear.  Eyes:     Extraocular Movements: Extraocular movements intact.     Pupils: Pupils are equal, round, and reactive to light.  Neck:     Thyroid: No thyroid mass, thyromegaly or thyroid tenderness.  Cardiovascular:     Rate and Rhythm: Normal rate and regular rhythm.  Pulmonary:     Effort: Pulmonary effort is normal.     Breath sounds: Normal breath sounds.  Abdominal:     General: Bowel sounds are normal.  Musculoskeletal:     Right lower leg: 1+ Pitting Edema present.     Left lower leg: 1+ Pitting Edema present.  Lymphadenopathy:     Cervical: No cervical adenopathy.  Neurological:     General: No focal deficit present.     Mental Status: He is alert.     Motor: No weakness.     Coordination: Coordination normal.     Gait: Gait normal.     Deep Tendon Reflexes: Reflexes normal.  Psychiatric:        Mood and Affect: Mood normal.        Behavior: Behavior normal.        Thought Content: Thought content normal.        Judgment: Judgment normal.    BP (!) 162/104   Pulse 70   Temp 98.2 F  (36.8 C)   Resp 14   Ht 6\' 6"  (1.981 m)   Wt (!) 326 lb 4 oz (148  kg)   SpO2 98%   BMI 37.70 kg/m  Wt Readings from Last 3 Encounters:  07/20/21 (!) 326 lb 4 oz (148 kg)  06/08/21 (!) 324 lb (147 kg)  03/21/21 (!) 323 lb 12 oz (146.9 kg)   Hearing Screening   500Hz  1000Hz  2000Hz  4000Hz   Right ear 25 25 25  0  Left ear 25 25 25  0  Vision Screening - Comments:: Had eye exam with Dr Bing Plume in spring 2022   [place] 06/08/2021 07/24/2019 06/05/2017  PHQ-9 Total Score 0 0 0      Health Maintenance Due  Topic Date Due   URINE MICROALBUMIN  Never done   Hepatitis C Screening  Never done   Zoster Vaccines- Shingrix (1 of 2) Never done   COVID-19 Vaccine (3 - Pfizer risk series) 02/16/2020   INFLUENZA VACCINE  05/15/2021    There are no preventive care reminders to display for this patient.  Lab Results  Component Value Date   TSH 0.98 05/03/2016   Lab Results  Component Value Date   WBC 11.1 (H) 10/27/2019   HGB 10.5 (L) 10/27/2019   HCT 31.7 (L) 10/27/2019   MCV 101.3 (H) 10/27/2019   PLT 136 (L) 10/27/2019   Lab Results  Component Value Date   NA 131 (L) 10/27/2019   K 5.0 10/27/2019   CO2 21 (L) 10/27/2019   GLUCOSE 89 10/27/2019   BUN 83 (H) 10/27/2019   CREATININE 19.31 (H) 10/27/2019   BILITOT 0.6 10/27/2019   ALKPHOS 45 10/27/2019   AST 10 (L) 10/27/2019   ALT 16 10/27/2019   PROT 7.0 10/27/2019   ALBUMIN 3.7 10/27/2019   CALCIUM 8.8 (L) 10/27/2019   ANIONGAP 16 (H) 10/27/2019   GFR 10.37 (LL) 06/07/2017   Lab Results  Component Value Date   CHOL 114 06/07/2017   Lab Results  Component Value Date   HDL 29.90 (L) 06/07/2017   Lab Results  Component Value Date   LDLCALC 65 06/07/2017   Lab Results  Component Value Date   TRIG 99.0 06/07/2017   Lab Results  Component Value Date   CHOLHDL 4 06/07/2017   Lab Results  Component Value Date   HGBA1C 5.1 06/07/2017      Assessment & Plan:   Problem List Items Addressed This Visit        Cardiovascular and Mediastinum   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  He is a hemodialysis patient.  Does hemodialysis at home 4-5 times at night.        Genitourinary   ESRD (end stage renal disease) ([place])   Relevant Orders   CBC   Comprehensive metabolic panel     Other   Class 2 severe obesity due to excess calories with serious comorbidity and body mass index (BMI) of 37.0 to 37.9 in adult [facility])    Encourage lifestyle modifications.      Relevant Orders   Hemoglobin A1c   Lipid panel   DOE (dyspnea on exertion)    Intermittent since his second bout with COVID.  States some days it is hard for him to walk across his yard to get to work.  Other days he does not feel quite so much is dialysis patient.  Does have 1+ pitting edema.  Close to dry weight.  Lungs clear to auscultation on exam will obtain a chest x-ray.  States that sometimes he has difficulty lying flat and sleeping also do  think this is fluid related      Relevant Orders   DG Chest 2 View   Medicare annual wellness visit, subsequent - Primary    Patient in room.  Did discuss vaccines and is up-to-date status.  We will administer flu vaccine today.  Did speak about Shingrix but unsure of co-pays never claims he can check with his pharmacy about administration of that vaccine.  Patient does not have an advanced directive we did discuss in detail the different advanced directives and where to go from there.  Patient knowledge understood we also discussed once he has an advanced directive to let the office know and bring [place] a copy so we can have him follow on [employer] that happens that he is unable to speak or produce paperwork.      Other Visit Diagnoses     Other fatigue       Relevant Orders   TSH   Need for influenza vaccination       Relevant Orders   Flu Vaccine QUAD 6+ mos PF IM (Fluarix Quad PF) (Completed)       No orders of the defined types were placed in  this encounter.   Follow-up: Return in about 6 months (around 01/18/2022) for Recheck.   This visit occurred during the SARS-CoV-2 public health emergency.  Safety protocols were in place, including screening questions prior to the visit, additional usage of staff PPE, and extensive cleaning of exam room while observing appropriate contact time as indicated for disinfecting solutions.   Romilda Garret, NP

## 2021-07-20 NOTE — Patient Instructions (Signed)
Nice to see you today Will see you in 6 months, sooner if needed

## 2021-07-20 NOTE — Assessment & Plan Note (Signed)
Encourage lifestyle modifications.

## 2021-07-20 NOTE — Assessment & Plan Note (Signed)
Patient managed by nephrology.  He is a hemodialysis patient.  Does hemodialysis at home 4-5 times at night.

## 2021-07-20 NOTE — Telephone Encounter (Signed)
Hope at the lab called with Critical lab pt's Cr. Is 11.20 and GFR 4.81 results given to PCP

## 2021-07-20 NOTE — Assessment & Plan Note (Signed)
Patient in room.  Did discuss vaccines and is up-to-date status.  We will administer flu vaccine today.  Did speak about Shingrix but unsure of co-pays never claims he can check with his pharmacy about administration of that vaccine.  Patient does not have an advanced directive we did discuss in detail the different advanced directives and where to go from there.  Patient knowledge understood we also discussed once he has an advanced directive to let the office know and bring [place] a copy so we can have him follow on [employer] that happens that he is unable to speak or produce paperwork.

## 2021-07-21 DIAGNOSIS — N2581 Secondary hyperparathyroidism of renal origin: Secondary | ICD-10-CM | POA: Diagnosis not present

## 2021-07-21 DIAGNOSIS — N186 End stage renal disease: Secondary | ICD-10-CM | POA: Diagnosis not present

## 2021-07-21 DIAGNOSIS — Z79899 Other long term (current) drug therapy: Secondary | ICD-10-CM | POA: Diagnosis not present

## 2021-07-21 DIAGNOSIS — D631 Anemia in chronic kidney disease: Secondary | ICD-10-CM | POA: Diagnosis not present

## 2021-07-21 DIAGNOSIS — Z992 Dependence on renal dialysis: Secondary | ICD-10-CM | POA: Diagnosis not present

## 2021-07-21 DIAGNOSIS — E440 Moderate protein-calorie malnutrition: Secondary | ICD-10-CM | POA: Diagnosis not present

## 2021-07-24 DIAGNOSIS — E440 Moderate protein-calorie malnutrition: Secondary | ICD-10-CM | POA: Diagnosis not present

## 2021-07-24 DIAGNOSIS — N186 End stage renal disease: Secondary | ICD-10-CM | POA: Diagnosis not present

## 2021-07-24 DIAGNOSIS — Z992 Dependence on renal dialysis: Secondary | ICD-10-CM | POA: Diagnosis not present

## 2021-07-24 DIAGNOSIS — Z79899 Other long term (current) drug therapy: Secondary | ICD-10-CM | POA: Diagnosis not present

## 2021-07-24 DIAGNOSIS — D631 Anemia in chronic kidney disease: Secondary | ICD-10-CM | POA: Diagnosis not present

## 2021-07-24 DIAGNOSIS — N2581 Secondary hyperparathyroidism of renal origin: Secondary | ICD-10-CM | POA: Diagnosis not present

## 2021-07-25 DIAGNOSIS — E440 Moderate protein-calorie malnutrition: Secondary | ICD-10-CM | POA: Diagnosis not present

## 2021-07-25 DIAGNOSIS — N186 End stage renal disease: Secondary | ICD-10-CM | POA: Diagnosis not present

## 2021-07-25 DIAGNOSIS — Z79899 Other long term (current) drug therapy: Secondary | ICD-10-CM | POA: Diagnosis not present

## 2021-07-25 DIAGNOSIS — D631 Anemia in chronic kidney disease: Secondary | ICD-10-CM | POA: Diagnosis not present

## 2021-07-25 DIAGNOSIS — N2581 Secondary hyperparathyroidism of renal origin: Secondary | ICD-10-CM | POA: Diagnosis not present

## 2021-07-25 DIAGNOSIS — Z992 Dependence on renal dialysis: Secondary | ICD-10-CM | POA: Diagnosis not present

## 2021-07-26 DIAGNOSIS — N189 Chronic kidney disease, unspecified: Secondary | ICD-10-CM | POA: Diagnosis not present

## 2021-07-26 DIAGNOSIS — Z01818 Encounter for other preprocedural examination: Secondary | ICD-10-CM | POA: Diagnosis not present

## 2021-07-26 DIAGNOSIS — I151 Hypertension secondary to other renal disorders: Secondary | ICD-10-CM | POA: Diagnosis not present

## 2021-07-26 DIAGNOSIS — N2889 Other specified disorders of kidney and ureter: Secondary | ICD-10-CM | POA: Diagnosis not present

## 2021-07-27 DIAGNOSIS — N186 End stage renal disease: Secondary | ICD-10-CM | POA: Diagnosis not present

## 2021-07-27 DIAGNOSIS — Z992 Dependence on renal dialysis: Secondary | ICD-10-CM | POA: Diagnosis not present

## 2021-07-27 DIAGNOSIS — E440 Moderate protein-calorie malnutrition: Secondary | ICD-10-CM | POA: Diagnosis not present

## 2021-07-27 DIAGNOSIS — N2581 Secondary hyperparathyroidism of renal origin: Secondary | ICD-10-CM | POA: Diagnosis not present

## 2021-07-27 DIAGNOSIS — D631 Anemia in chronic kidney disease: Secondary | ICD-10-CM | POA: Diagnosis not present

## 2021-07-27 DIAGNOSIS — Z79899 Other long term (current) drug therapy: Secondary | ICD-10-CM | POA: Diagnosis not present

## 2021-07-28 DIAGNOSIS — D631 Anemia in chronic kidney disease: Secondary | ICD-10-CM | POA: Diagnosis not present

## 2021-07-28 DIAGNOSIS — Z992 Dependence on renal dialysis: Secondary | ICD-10-CM | POA: Diagnosis not present

## 2021-07-28 DIAGNOSIS — N186 End stage renal disease: Secondary | ICD-10-CM | POA: Diagnosis not present

## 2021-07-28 DIAGNOSIS — N2581 Secondary hyperparathyroidism of renal origin: Secondary | ICD-10-CM | POA: Diagnosis not present

## 2021-07-28 DIAGNOSIS — E440 Moderate protein-calorie malnutrition: Secondary | ICD-10-CM | POA: Diagnosis not present

## 2021-07-28 DIAGNOSIS — Z79899 Other long term (current) drug therapy: Secondary | ICD-10-CM | POA: Diagnosis not present

## 2021-07-31 DIAGNOSIS — N2581 Secondary hyperparathyroidism of renal origin: Secondary | ICD-10-CM | POA: Diagnosis not present

## 2021-07-31 DIAGNOSIS — Z992 Dependence on renal dialysis: Secondary | ICD-10-CM | POA: Diagnosis not present

## 2021-07-31 DIAGNOSIS — E291 Testicular hypofunction: Secondary | ICD-10-CM | POA: Diagnosis not present

## 2021-07-31 DIAGNOSIS — Z79899 Other long term (current) drug therapy: Secondary | ICD-10-CM | POA: Diagnosis not present

## 2021-07-31 DIAGNOSIS — N186 End stage renal disease: Secondary | ICD-10-CM | POA: Diagnosis not present

## 2021-07-31 DIAGNOSIS — D631 Anemia in chronic kidney disease: Secondary | ICD-10-CM | POA: Diagnosis not present

## 2021-07-31 DIAGNOSIS — E440 Moderate protein-calorie malnutrition: Secondary | ICD-10-CM | POA: Diagnosis not present

## 2021-08-01 DIAGNOSIS — Z992 Dependence on renal dialysis: Secondary | ICD-10-CM | POA: Diagnosis not present

## 2021-08-01 DIAGNOSIS — M1040 Other secondary gout, unspecified site: Secondary | ICD-10-CM | POA: Diagnosis not present

## 2021-08-01 DIAGNOSIS — E440 Moderate protein-calorie malnutrition: Secondary | ICD-10-CM | POA: Diagnosis not present

## 2021-08-01 DIAGNOSIS — N2581 Secondary hyperparathyroidism of renal origin: Secondary | ICD-10-CM | POA: Diagnosis not present

## 2021-08-01 DIAGNOSIS — Z79899 Other long term (current) drug therapy: Secondary | ICD-10-CM | POA: Diagnosis not present

## 2021-08-01 DIAGNOSIS — D631 Anemia in chronic kidney disease: Secondary | ICD-10-CM | POA: Diagnosis not present

## 2021-08-01 DIAGNOSIS — N186 End stage renal disease: Secondary | ICD-10-CM | POA: Diagnosis not present

## 2021-08-03 DIAGNOSIS — N2581 Secondary hyperparathyroidism of renal origin: Secondary | ICD-10-CM | POA: Diagnosis not present

## 2021-08-03 DIAGNOSIS — N186 End stage renal disease: Secondary | ICD-10-CM | POA: Diagnosis not present

## 2021-08-03 DIAGNOSIS — Z79899 Other long term (current) drug therapy: Secondary | ICD-10-CM | POA: Diagnosis not present

## 2021-08-03 DIAGNOSIS — Z992 Dependence on renal dialysis: Secondary | ICD-10-CM | POA: Diagnosis not present

## 2021-08-03 DIAGNOSIS — D631 Anemia in chronic kidney disease: Secondary | ICD-10-CM | POA: Diagnosis not present

## 2021-08-03 DIAGNOSIS — E440 Moderate protein-calorie malnutrition: Secondary | ICD-10-CM | POA: Diagnosis not present

## 2021-08-04 DIAGNOSIS — E440 Moderate protein-calorie malnutrition: Secondary | ICD-10-CM | POA: Diagnosis not present

## 2021-08-04 DIAGNOSIS — N186 End stage renal disease: Secondary | ICD-10-CM | POA: Diagnosis not present

## 2021-08-04 DIAGNOSIS — Z992 Dependence on renal dialysis: Secondary | ICD-10-CM | POA: Diagnosis not present

## 2021-08-04 DIAGNOSIS — I10 Essential (primary) hypertension: Secondary | ICD-10-CM | POA: Diagnosis not present

## 2021-08-04 DIAGNOSIS — Z79899 Other long term (current) drug therapy: Secondary | ICD-10-CM | POA: Diagnosis not present

## 2021-08-04 DIAGNOSIS — N2581 Secondary hyperparathyroidism of renal origin: Secondary | ICD-10-CM | POA: Diagnosis not present

## 2021-08-04 DIAGNOSIS — D631 Anemia in chronic kidney disease: Secondary | ICD-10-CM | POA: Diagnosis not present

## 2021-08-07 DIAGNOSIS — E291 Testicular hypofunction: Secondary | ICD-10-CM | POA: Diagnosis not present

## 2021-08-07 DIAGNOSIS — Z79899 Other long term (current) drug therapy: Secondary | ICD-10-CM | POA: Diagnosis not present

## 2021-08-07 DIAGNOSIS — Z992 Dependence on renal dialysis: Secondary | ICD-10-CM | POA: Diagnosis not present

## 2021-08-07 DIAGNOSIS — D631 Anemia in chronic kidney disease: Secondary | ICD-10-CM | POA: Diagnosis not present

## 2021-08-07 DIAGNOSIS — E440 Moderate protein-calorie malnutrition: Secondary | ICD-10-CM | POA: Diagnosis not present

## 2021-08-07 DIAGNOSIS — N186 End stage renal disease: Secondary | ICD-10-CM | POA: Diagnosis not present

## 2021-08-07 DIAGNOSIS — N2581 Secondary hyperparathyroidism of renal origin: Secondary | ICD-10-CM | POA: Diagnosis not present

## 2021-08-08 DIAGNOSIS — D631 Anemia in chronic kidney disease: Secondary | ICD-10-CM | POA: Diagnosis not present

## 2021-08-08 DIAGNOSIS — Z992 Dependence on renal dialysis: Secondary | ICD-10-CM | POA: Diagnosis not present

## 2021-08-08 DIAGNOSIS — Z79899 Other long term (current) drug therapy: Secondary | ICD-10-CM | POA: Diagnosis not present

## 2021-08-08 DIAGNOSIS — E440 Moderate protein-calorie malnutrition: Secondary | ICD-10-CM | POA: Diagnosis not present

## 2021-08-08 DIAGNOSIS — N186 End stage renal disease: Secondary | ICD-10-CM | POA: Diagnosis not present

## 2021-08-08 DIAGNOSIS — N2581 Secondary hyperparathyroidism of renal origin: Secondary | ICD-10-CM | POA: Diagnosis not present

## 2021-08-10 ENCOUNTER — Encounter: Payer: Self-pay | Admitting: Pulmonary Disease

## 2021-08-10 ENCOUNTER — Ambulatory Visit (INDEPENDENT_AMBULATORY_CARE_PROVIDER_SITE_OTHER): Payer: Medicare Other | Admitting: Pulmonary Disease

## 2021-08-10 ENCOUNTER — Other Ambulatory Visit: Payer: Self-pay

## 2021-08-10 VITALS — BP 158/84 | HR 66 | Temp 98.0°F | Ht 77.0 in | Wt 327.0 lb

## 2021-08-10 DIAGNOSIS — G4733 Obstructive sleep apnea (adult) (pediatric): Secondary | ICD-10-CM

## 2021-08-10 DIAGNOSIS — E440 Moderate protein-calorie malnutrition: Secondary | ICD-10-CM | POA: Diagnosis not present

## 2021-08-10 DIAGNOSIS — N2581 Secondary hyperparathyroidism of renal origin: Secondary | ICD-10-CM | POA: Diagnosis not present

## 2021-08-10 DIAGNOSIS — N186 End stage renal disease: Secondary | ICD-10-CM | POA: Diagnosis not present

## 2021-08-10 DIAGNOSIS — Z79899 Other long term (current) drug therapy: Secondary | ICD-10-CM | POA: Diagnosis not present

## 2021-08-10 DIAGNOSIS — Z992 Dependence on renal dialysis: Secondary | ICD-10-CM | POA: Diagnosis not present

## 2021-08-10 DIAGNOSIS — D631 Anemia in chronic kidney disease: Secondary | ICD-10-CM | POA: Diagnosis not present

## 2021-08-10 NOTE — Progress Notes (Signed)
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    amLODipine (NORVASC) 10 MG tablet Take 10 mg by mouth daily.    BYSTOLIC 10 MG tablet Take 10 mg by mouth daily.    calcium acetate (PHOSLO) 667 MG tablet Take 1 tablet by mouth 3 (three) times daily with meals.   furosemide (LASIX) 40 MG tablet Take 40 mg by mouth every other day.    gabapentin (NEURONTIN) 100 MG capsule Take 300 mg by mouth at bedtime.   tamsulosin (FLOMAX) 0.4 MG CAPS capsule TAKE 1 CAPSULE BY MOUTH EVERY DAY   testosterone cypionate (DEPOTESTOSTERONE CYPIONATE) 200 MG/ML injection INJECT 1 ML (200 MG TOTAL) INTO THE MUSCLE EVERY 14 (FOURTEEN) DAYS. MUST SCHEDULE ANNUAL EXAM   [DISCONTINUED] cinacalcet (SENSIPAR) 60 MG tablet Take 60 mg by mouth daily. (Patient not taking: Reported on 08/10/2021)   [DISCONTINUED] VELPHORO 500 MG chewable tablet Chew 1,000 mg by mouth 3 (three) times daily.   No facility-administered encounter medications on file as of 08/10/2021.    Allergies as of 08/10/2021   (No Known Allergies)    Past Medical History:   Diagnosis Date   Fibula fracture    ORIF 1990   Gout    non crystal proven    History of chickenpox    History of kidney disease    HTN (hypertension)    Nephropathy    Ig A; dx 2002. micropscopic hematuria and proteinuria   Tibia fracture    ORIF 1990   Varicocele    L testes   Venous incompetence    R leg    Past Surgical History:  Procedure Laterality Date   APPENDECTOMY  1982   AV FISTULA PLACEMENT Left 06/27/2017   Procedure: LEFT ARM ARTERIOVENOUS (AV) FISTULA CREATION;  Surgeon: Waynetta Sandy, MD;  Location: [facility];  Service: Vascular;  Laterality: Left;   stab phlebectomy Left 12/17/2016   stab phlebectomy >20 incisions (left leg) by Tinnie Gens MD     Family History  Problem Relation Age of Onset   Cancer Paternal Grandfather 41       brain cancer   Hypertension Other        GP   Stroke Other    Diabetes Other        DM - GP    Heart disease Neg Hx     Social History   Socioeconomic History   Marital status: Divorced    Spouse name: Not on file   Number of children: 2   Years of education: Not on file   Highest education  level: Not on file  Occupational History   Occupation: Hydrologist    Comment: Charity fundraiser  Tobacco Use   Smoking status: Never   Smokeless tobacco: Never  Vaping Use   Vaping Use: Never used  Substance and Sexual Activity   Alcohol use: No    Alcohol/week: 0.0 standard drinks   Drug use: No   Sexual activity: Yes  Other Topics Concern   Not on file  Social History Narrative   Divorced, primary custody of son; Charity fundraiser; gets regular exercise.       Pt signed designated party release and gives no access to medical records. Can leave msg on cell [phone].    Social Determinants of Health   Financial Resource Strain: Not on file  Food Insecurity: Not on file  Transportation Needs: Not on file  Physical Activity: Not on file  Stress: Not on file  Social Connections: Not on file   Intimate Partner Violence: Not on file    Review of Systems  Psychiatric/Behavioral:  Positive for sleep disturbance.    Vitals:   08/10/21 1527  BP: (!) 158/84  Pulse: 66  Temp: 98 F (36.7 C)  SpO2: 96%     Physical Exam Constitutional:      Appearance: He is obese.  HENT:     Head: Normocephalic.     Nose: Nose normal.     Mouth/Throat:     Mouth: Mucous membranes are moist.     Comments: Mallampati 4, crowded oropharynx, macroglossia Eyes:     Pupils: Pupils are equal, round, and reactive to light.  Cardiovascular:     Rate and Rhythm: Normal rate and regular rhythm.     Heart sounds: No murmur heard.   No friction rub.  Pulmonary:     Effort: No respiratory distress.     Breath sounds: No stridor. No wheezing or rhonchi.  Musculoskeletal:     Cervical back: No rigidity or tenderness.  Neurological:     Mental Status: He is alert.  Psychiatric:        Mood and Affect: Mood normal.   Results of the Epworth flowsheet 08/10/2021  Sitting and reading 3  Watching TV 2  Sitting, inactive in a public place (e.g. a theatre or a meeting) 3  As a passenger in a car for an hour without a break 2  Lying down to rest in the afternoon when circumstances permit 3  Sitting and talking to someone 1  Sitting quietly after a lunch without alcohol 3  In a car, while stopped for a few minutes in traffic 2  Total score 19    Data Reviewed: No previous sleep study Echocardiogram 03/05/2017 shows pulmonary pressures of 34, normal EF  Assessment:  Moderate probability of significant obstructive sleep apnea  Obesity  Excessive daytime sleepiness  End-stage renal disease on hemodialysis  Pathophysiology of sleep disordered breathing discussed with the patient Treatment options discussed with patient  Plan/Recommendations: Schedule patient for home sleep study  Regular exercise encouraged for weight loss  Options of treatment have been reviewed  Tentative  follow-up in 3 to 4 months  Encouraged to call with any significant concerns   Sherrilyn Rist MD [facility] 08/10/2021, 3:51 PM  CC: Michela Pitcher, NP

## 2021-08-10 NOTE — Patient Instructions (Signed)
Moderate probability of significant obstructive sleep apnea  We will schedule you for home sleep study Treatment options as we discussed  Tentative follow-up in 3 to 4 months  Weight loss efforts as tolerated  Call with significant concerns  Sleep Apnea Sleep apnea is a condition in which breathing pauses or becomes shallow during sleep. People with sleep apnea usually snore loudly. They may have times when they gasp and stop breathing for 10 seconds or more during sleep. This may happen many times during the night. Sleep apnea disrupts your sleep and keeps your body from getting the rest that it needs. This condition can increase your risk of certain health problems, including: Heart attack. Stroke. Obesity. Type 2 diabetes. Heart failure. Irregular heartbeat. High blood pressure. The goal of treatment is to help you breathe normally again. What are the causes? The most common cause of sleep apnea is a collapsed or blocked airway. There are three kinds of sleep apnea: Obstructive sleep apnea. This kind is caused by a blocked or collapsed airway. Central sleep apnea. This kind happens when the part of the brain that controls breathing does not send the correct signals to the muscles that control breathing. Mixed sleep apnea. This is a combination of obstructive and central sleep apnea. What increases the risk? You are more likely to develop this condition if you: Are overweight. Smoke. Have a smaller than normal airway. Are older. Are male. Drink alcohol. Take sedatives or tranquilizers. Have a family history of sleep apnea. Have a tongue or tonsils that are larger than normal. What are the signs or symptoms? Symptoms of this condition include: Trouble staying asleep. Loud snoring. Morning headaches. Waking up gasping. Dry mouth or sore throat in the morning. Daytime sleepiness and tiredness. If you have daytime fatigue because of sleep apnea, you may be more likely to  have: Trouble concentrating. Forgetfulness. Irritability or mood swings. Personality changes. Feelings of depression. Sexual dysfunction. This may include loss of interest if you are male, or erectile dysfunction if you are male. How is this diagnosed? This condition may be diagnosed with: A medical history. A physical exam. A series of tests that are done while you are sleeping (sleep study). These tests are usually done in a sleep lab, but they may also be done at home. How is this treated? Treatment for this condition aims to restore normal breathing and to ease symptoms during sleep. It may involve managing health issues that can affect breathing, such as high blood pressure or obesity. Treatment may include: Sleeping on your side. Using a decongestant if you have nasal congestion. Avoiding the use of depressants, including alcohol, sedatives, and narcotics. Losing weight if you are overweight. Making changes to your diet. Quitting smoking. Using a device to open your airway while you sleep, such as: An oral appliance. This is a custom-made mouthpiece that shifts your lower jaw forward. A continuous positive airway pressure (CPAP) device. This device blows air through a mask when you breathe out (exhale). A nasal expiratory positive airway pressure (EPAP) device. This device has valves that you put into each nostril. A bi-level positive airway pressure (BPAP) device. This device blows air through a mask when you breathe in (inhale) and breathe out (exhale). Having surgery if other treatments do not work. During surgery, excess tissue is removed to create a wider airway. Follow these instructions at home: Lifestyle Make any lifestyle changes that your health care provider recommends. Eat a healthy, well-balanced diet. Take steps to lose weight  if you are overweight. Avoid using depressants, including alcohol, sedatives, and narcotics. Do not use any products that contain nicotine  or tobacco. These products include cigarettes, chewing tobacco, and vaping devices, such as e-cigarettes. If you need help quitting, ask your health care provider. General instructions Take over-the-counter and prescription medicines only as told by your health care provider. If you were given a device to open your airway while you sleep, use it only as told by your health care provider. If you are having surgery, make sure to tell your health care provider you have sleep apnea. You may need to bring your device with you. Keep all follow-up visits. This is important. Contact a health care provider if: The device that you received to open your airway during sleep is uncomfortable or does not seem to be working. Your symptoms do not improve. Your symptoms get worse. Get help right away if: You develop: Chest pain. Shortness of breath. Discomfort in your back, arms, or stomach. You have: Trouble speaking. Weakness on one side of your body. Drooping in your face. These symptoms may represent a serious problem that is an emergency. Do not wait to see if the symptoms will go away. Get medical help right away. Call your local emergency services (911 in the [place] Do not drive yourself to the hospital. Summary Sleep apnea is a condition in which breathing pauses or becomes shallow during sleep. The most common cause is a collapsed or blocked airway. The goal of treatment is to restore normal breathing and to ease symptoms during sleep. This information is not intended to replace advice given to you by your health care provider. Make sure you discuss any questions you have with your health care provider. Document Revised: 09/09/2020 Document Reviewed: 09/09/2020 Elsevier Patient Education  2022 [employer].

## 2021-08-11 DIAGNOSIS — Z79899 Other long term (current) drug therapy: Secondary | ICD-10-CM | POA: Diagnosis not present

## 2021-08-11 DIAGNOSIS — Z992 Dependence on renal dialysis: Secondary | ICD-10-CM | POA: Diagnosis not present

## 2021-08-11 DIAGNOSIS — N186 End stage renal disease: Secondary | ICD-10-CM | POA: Diagnosis not present

## 2021-08-11 DIAGNOSIS — N2581 Secondary hyperparathyroidism of renal origin: Secondary | ICD-10-CM | POA: Diagnosis not present

## 2021-08-11 DIAGNOSIS — D631 Anemia in chronic kidney disease: Secondary | ICD-10-CM | POA: Diagnosis not present

## 2021-08-11 DIAGNOSIS — E440 Moderate protein-calorie malnutrition: Secondary | ICD-10-CM | POA: Diagnosis not present

## 2021-08-14 DIAGNOSIS — Z79899 Other long term (current) drug therapy: Secondary | ICD-10-CM | POA: Diagnosis not present

## 2021-08-14 DIAGNOSIS — D631 Anemia in chronic kidney disease: Secondary | ICD-10-CM | POA: Diagnosis not present

## 2021-08-14 DIAGNOSIS — E440 Moderate protein-calorie malnutrition: Secondary | ICD-10-CM | POA: Diagnosis not present

## 2021-08-14 DIAGNOSIS — N2581 Secondary hyperparathyroidism of renal origin: Secondary | ICD-10-CM | POA: Diagnosis not present

## 2021-08-14 DIAGNOSIS — N186 End stage renal disease: Secondary | ICD-10-CM | POA: Diagnosis not present

## 2021-08-14 DIAGNOSIS — Z992 Dependence on renal dialysis: Secondary | ICD-10-CM | POA: Diagnosis not present

## 2021-08-15 DIAGNOSIS — N028 Recurrent and persistent hematuria with other morphologic changes: Secondary | ICD-10-CM | POA: Diagnosis not present

## 2021-08-15 DIAGNOSIS — I120 Hypertensive chronic kidney disease with stage 5 chronic kidney disease or end stage renal disease: Secondary | ICD-10-CM | POA: Diagnosis not present

## 2021-08-15 DIAGNOSIS — E440 Moderate protein-calorie malnutrition: Secondary | ICD-10-CM | POA: Diagnosis not present

## 2021-08-15 DIAGNOSIS — D509 Iron deficiency anemia, unspecified: Secondary | ICD-10-CM | POA: Diagnosis not present

## 2021-08-15 DIAGNOSIS — N186 End stage renal disease: Secondary | ICD-10-CM | POA: Diagnosis not present

## 2021-08-15 DIAGNOSIS — D631 Anemia in chronic kidney disease: Secondary | ICD-10-CM | POA: Diagnosis not present

## 2021-08-15 DIAGNOSIS — Z79899 Other long term (current) drug therapy: Secondary | ICD-10-CM | POA: Diagnosis not present

## 2021-08-15 DIAGNOSIS — Z992 Dependence on renal dialysis: Secondary | ICD-10-CM | POA: Diagnosis not present

## 2021-08-15 DIAGNOSIS — N2581 Secondary hyperparathyroidism of renal origin: Secondary | ICD-10-CM | POA: Diagnosis not present

## 2021-08-17 DIAGNOSIS — Z79899 Other long term (current) drug therapy: Secondary | ICD-10-CM | POA: Diagnosis not present

## 2021-08-17 DIAGNOSIS — N186 End stage renal disease: Secondary | ICD-10-CM | POA: Diagnosis not present

## 2021-08-17 DIAGNOSIS — D631 Anemia in chronic kidney disease: Secondary | ICD-10-CM | POA: Diagnosis not present

## 2021-08-17 DIAGNOSIS — Z992 Dependence on renal dialysis: Secondary | ICD-10-CM | POA: Diagnosis not present

## 2021-08-17 DIAGNOSIS — N2581 Secondary hyperparathyroidism of renal origin: Secondary | ICD-10-CM | POA: Diagnosis not present

## 2021-08-18 DIAGNOSIS — N2581 Secondary hyperparathyroidism of renal origin: Secondary | ICD-10-CM | POA: Diagnosis not present

## 2021-08-18 DIAGNOSIS — N186 End stage renal disease: Secondary | ICD-10-CM | POA: Diagnosis not present

## 2021-08-18 DIAGNOSIS — D631 Anemia in chronic kidney disease: Secondary | ICD-10-CM | POA: Diagnosis not present

## 2021-08-18 DIAGNOSIS — Z79899 Other long term (current) drug therapy: Secondary | ICD-10-CM | POA: Diagnosis not present

## 2021-08-18 DIAGNOSIS — Z992 Dependence on renal dialysis: Secondary | ICD-10-CM | POA: Diagnosis not present

## 2021-08-21 DIAGNOSIS — N186 End stage renal disease: Secondary | ICD-10-CM | POA: Diagnosis not present

## 2021-08-21 DIAGNOSIS — Z992 Dependence on renal dialysis: Secondary | ICD-10-CM | POA: Diagnosis not present

## 2021-08-21 DIAGNOSIS — Z79899 Other long term (current) drug therapy: Secondary | ICD-10-CM | POA: Diagnosis not present

## 2021-08-21 DIAGNOSIS — D631 Anemia in chronic kidney disease: Secondary | ICD-10-CM | POA: Diagnosis not present

## 2021-08-21 DIAGNOSIS — N2581 Secondary hyperparathyroidism of renal origin: Secondary | ICD-10-CM | POA: Diagnosis not present

## 2021-08-22 DIAGNOSIS — D631 Anemia in chronic kidney disease: Secondary | ICD-10-CM | POA: Diagnosis not present

## 2021-08-22 DIAGNOSIS — I517 Cardiomegaly: Secondary | ICD-10-CM | POA: Diagnosis not present

## 2021-08-22 DIAGNOSIS — Z992 Dependence on renal dialysis: Secondary | ICD-10-CM | POA: Diagnosis not present

## 2021-08-22 DIAGNOSIS — I081 Rheumatic disorders of both mitral and tricuspid valves: Secondary | ICD-10-CM | POA: Diagnosis not present

## 2021-08-22 DIAGNOSIS — N2581 Secondary hyperparathyroidism of renal origin: Secondary | ICD-10-CM | POA: Diagnosis not present

## 2021-08-22 DIAGNOSIS — I340 Nonrheumatic mitral (valve) insufficiency: Secondary | ICD-10-CM | POA: Insufficient documentation

## 2021-08-22 DIAGNOSIS — N186 End stage renal disease: Secondary | ICD-10-CM | POA: Diagnosis not present

## 2021-08-22 DIAGNOSIS — Z79899 Other long term (current) drug therapy: Secondary | ICD-10-CM | POA: Diagnosis not present

## 2021-08-23 DIAGNOSIS — N2581 Secondary hyperparathyroidism of renal origin: Secondary | ICD-10-CM | POA: Diagnosis not present

## 2021-08-23 DIAGNOSIS — D631 Anemia in chronic kidney disease: Secondary | ICD-10-CM | POA: Diagnosis not present

## 2021-08-23 DIAGNOSIS — Z79899 Other long term (current) drug therapy: Secondary | ICD-10-CM | POA: Diagnosis not present

## 2021-08-23 DIAGNOSIS — M1040 Other secondary gout, unspecified site: Secondary | ICD-10-CM | POA: Diagnosis not present

## 2021-08-23 DIAGNOSIS — N186 End stage renal disease: Secondary | ICD-10-CM | POA: Diagnosis not present

## 2021-08-23 DIAGNOSIS — Z992 Dependence on renal dialysis: Secondary | ICD-10-CM | POA: Diagnosis not present

## 2021-08-24 DIAGNOSIS — N2581 Secondary hyperparathyroidism of renal origin: Secondary | ICD-10-CM | POA: Diagnosis not present

## 2021-08-24 DIAGNOSIS — D631 Anemia in chronic kidney disease: Secondary | ICD-10-CM | POA: Diagnosis not present

## 2021-08-24 DIAGNOSIS — Z79899 Other long term (current) drug therapy: Secondary | ICD-10-CM | POA: Diagnosis not present

## 2021-08-24 DIAGNOSIS — Z992 Dependence on renal dialysis: Secondary | ICD-10-CM | POA: Diagnosis not present

## 2021-08-24 DIAGNOSIS — N186 End stage renal disease: Secondary | ICD-10-CM | POA: Diagnosis not present

## 2021-08-25 DIAGNOSIS — Z79899 Other long term (current) drug therapy: Secondary | ICD-10-CM | POA: Diagnosis not present

## 2021-08-25 DIAGNOSIS — N2581 Secondary hyperparathyroidism of renal origin: Secondary | ICD-10-CM | POA: Diagnosis not present

## 2021-08-25 DIAGNOSIS — Z992 Dependence on renal dialysis: Secondary | ICD-10-CM | POA: Diagnosis not present

## 2021-08-25 DIAGNOSIS — D631 Anemia in chronic kidney disease: Secondary | ICD-10-CM | POA: Diagnosis not present

## 2021-08-25 DIAGNOSIS — N186 End stage renal disease: Secondary | ICD-10-CM | POA: Diagnosis not present

## 2021-08-28 DIAGNOSIS — Z992 Dependence on renal dialysis: Secondary | ICD-10-CM | POA: Diagnosis not present

## 2021-08-28 DIAGNOSIS — N2581 Secondary hyperparathyroidism of renal origin: Secondary | ICD-10-CM | POA: Diagnosis not present

## 2021-08-28 DIAGNOSIS — D631 Anemia in chronic kidney disease: Secondary | ICD-10-CM | POA: Diagnosis not present

## 2021-08-28 DIAGNOSIS — N186 End stage renal disease: Secondary | ICD-10-CM | POA: Diagnosis not present

## 2021-08-28 DIAGNOSIS — Z79899 Other long term (current) drug therapy: Secondary | ICD-10-CM | POA: Diagnosis not present

## 2021-08-29 DIAGNOSIS — D631 Anemia in chronic kidney disease: Secondary | ICD-10-CM | POA: Diagnosis not present

## 2021-08-29 DIAGNOSIS — N186 End stage renal disease: Secondary | ICD-10-CM | POA: Diagnosis not present

## 2021-08-29 DIAGNOSIS — Z79899 Other long term (current) drug therapy: Secondary | ICD-10-CM | POA: Diagnosis not present

## 2021-08-29 DIAGNOSIS — N2581 Secondary hyperparathyroidism of renal origin: Secondary | ICD-10-CM | POA: Diagnosis not present

## 2021-08-29 DIAGNOSIS — Z992 Dependence on renal dialysis: Secondary | ICD-10-CM | POA: Diagnosis not present

## 2021-08-31 DIAGNOSIS — N186 End stage renal disease: Secondary | ICD-10-CM | POA: Diagnosis not present

## 2021-08-31 DIAGNOSIS — Z992 Dependence on renal dialysis: Secondary | ICD-10-CM | POA: Diagnosis not present

## 2021-08-31 DIAGNOSIS — Z79899 Other long term (current) drug therapy: Secondary | ICD-10-CM | POA: Diagnosis not present

## 2021-08-31 DIAGNOSIS — N2581 Secondary hyperparathyroidism of renal origin: Secondary | ICD-10-CM | POA: Diagnosis not present

## 2021-08-31 DIAGNOSIS — D631 Anemia in chronic kidney disease: Secondary | ICD-10-CM | POA: Diagnosis not present

## 2021-09-01 DIAGNOSIS — Z992 Dependence on renal dialysis: Secondary | ICD-10-CM | POA: Diagnosis not present

## 2021-09-01 DIAGNOSIS — N186 End stage renal disease: Secondary | ICD-10-CM | POA: Diagnosis not present

## 2021-09-01 DIAGNOSIS — N2581 Secondary hyperparathyroidism of renal origin: Secondary | ICD-10-CM | POA: Diagnosis not present

## 2021-09-01 DIAGNOSIS — D631 Anemia in chronic kidney disease: Secondary | ICD-10-CM | POA: Diagnosis not present

## 2021-09-01 DIAGNOSIS — Z79899 Other long term (current) drug therapy: Secondary | ICD-10-CM | POA: Diagnosis not present

## 2021-09-04 DIAGNOSIS — Z79899 Other long term (current) drug therapy: Secondary | ICD-10-CM | POA: Diagnosis not present

## 2021-09-04 DIAGNOSIS — N186 End stage renal disease: Secondary | ICD-10-CM | POA: Diagnosis not present

## 2021-09-04 DIAGNOSIS — Z992 Dependence on renal dialysis: Secondary | ICD-10-CM | POA: Diagnosis not present

## 2021-09-04 DIAGNOSIS — N2581 Secondary hyperparathyroidism of renal origin: Secondary | ICD-10-CM | POA: Diagnosis not present

## 2021-09-04 DIAGNOSIS — D631 Anemia in chronic kidney disease: Secondary | ICD-10-CM | POA: Diagnosis not present

## 2021-09-05 DIAGNOSIS — Z79899 Other long term (current) drug therapy: Secondary | ICD-10-CM | POA: Diagnosis not present

## 2021-09-05 DIAGNOSIS — N2581 Secondary hyperparathyroidism of renal origin: Secondary | ICD-10-CM | POA: Diagnosis not present

## 2021-09-05 DIAGNOSIS — N186 End stage renal disease: Secondary | ICD-10-CM | POA: Diagnosis not present

## 2021-09-05 DIAGNOSIS — D631 Anemia in chronic kidney disease: Secondary | ICD-10-CM | POA: Diagnosis not present

## 2021-09-05 DIAGNOSIS — Z992 Dependence on renal dialysis: Secondary | ICD-10-CM | POA: Diagnosis not present

## 2021-09-07 DIAGNOSIS — N2581 Secondary hyperparathyroidism of renal origin: Secondary | ICD-10-CM | POA: Diagnosis not present

## 2021-09-07 DIAGNOSIS — N186 End stage renal disease: Secondary | ICD-10-CM | POA: Diagnosis not present

## 2021-09-07 DIAGNOSIS — D631 Anemia in chronic kidney disease: Secondary | ICD-10-CM | POA: Diagnosis not present

## 2021-09-07 DIAGNOSIS — Z79899 Other long term (current) drug therapy: Secondary | ICD-10-CM | POA: Diagnosis not present

## 2021-09-07 DIAGNOSIS — Z992 Dependence on renal dialysis: Secondary | ICD-10-CM | POA: Diagnosis not present

## 2021-09-08 DIAGNOSIS — Z79899 Other long term (current) drug therapy: Secondary | ICD-10-CM | POA: Diagnosis not present

## 2021-09-08 DIAGNOSIS — D631 Anemia in chronic kidney disease: Secondary | ICD-10-CM | POA: Diagnosis not present

## 2021-09-08 DIAGNOSIS — N186 End stage renal disease: Secondary | ICD-10-CM | POA: Diagnosis not present

## 2021-09-08 DIAGNOSIS — Z992 Dependence on renal dialysis: Secondary | ICD-10-CM | POA: Diagnosis not present

## 2021-09-08 DIAGNOSIS — N2581 Secondary hyperparathyroidism of renal origin: Secondary | ICD-10-CM | POA: Diagnosis not present

## 2021-09-11 DIAGNOSIS — D631 Anemia in chronic kidney disease: Secondary | ICD-10-CM | POA: Diagnosis not present

## 2021-09-11 DIAGNOSIS — Z79899 Other long term (current) drug therapy: Secondary | ICD-10-CM | POA: Diagnosis not present

## 2021-09-11 DIAGNOSIS — Z992 Dependence on renal dialysis: Secondary | ICD-10-CM | POA: Diagnosis not present

## 2021-09-11 DIAGNOSIS — N186 End stage renal disease: Secondary | ICD-10-CM | POA: Diagnosis not present

## 2021-09-11 DIAGNOSIS — N2581 Secondary hyperparathyroidism of renal origin: Secondary | ICD-10-CM | POA: Diagnosis not present

## 2021-09-12 DIAGNOSIS — Z79899 Other long term (current) drug therapy: Secondary | ICD-10-CM | POA: Diagnosis not present

## 2021-09-12 DIAGNOSIS — Z992 Dependence on renal dialysis: Secondary | ICD-10-CM | POA: Diagnosis not present

## 2021-09-12 DIAGNOSIS — N186 End stage renal disease: Secondary | ICD-10-CM | POA: Diagnosis not present

## 2021-09-12 DIAGNOSIS — N2581 Secondary hyperparathyroidism of renal origin: Secondary | ICD-10-CM | POA: Diagnosis not present

## 2021-09-12 DIAGNOSIS — D631 Anemia in chronic kidney disease: Secondary | ICD-10-CM | POA: Diagnosis not present

## 2021-09-14 DIAGNOSIS — N2581 Secondary hyperparathyroidism of renal origin: Secondary | ICD-10-CM | POA: Diagnosis not present

## 2021-09-14 DIAGNOSIS — N028 Recurrent and persistent hematuria with other morphologic changes: Secondary | ICD-10-CM | POA: Diagnosis not present

## 2021-09-14 DIAGNOSIS — N2589 Other disorders resulting from impaired renal tubular function: Secondary | ICD-10-CM | POA: Diagnosis not present

## 2021-09-14 DIAGNOSIS — N186 End stage renal disease: Secondary | ICD-10-CM | POA: Diagnosis not present

## 2021-09-14 DIAGNOSIS — D631 Anemia in chronic kidney disease: Secondary | ICD-10-CM | POA: Diagnosis not present

## 2021-09-14 DIAGNOSIS — Z992 Dependence on renal dialysis: Secondary | ICD-10-CM | POA: Diagnosis not present

## 2021-09-14 DIAGNOSIS — D509 Iron deficiency anemia, unspecified: Secondary | ICD-10-CM | POA: Diagnosis not present

## 2021-09-14 DIAGNOSIS — I120 Hypertensive chronic kidney disease with stage 5 chronic kidney disease or end stage renal disease: Secondary | ICD-10-CM | POA: Diagnosis not present

## 2021-09-15 DIAGNOSIS — D631 Anemia in chronic kidney disease: Secondary | ICD-10-CM | POA: Diagnosis not present

## 2021-09-15 DIAGNOSIS — N186 End stage renal disease: Secondary | ICD-10-CM | POA: Diagnosis not present

## 2021-09-15 DIAGNOSIS — N2581 Secondary hyperparathyroidism of renal origin: Secondary | ICD-10-CM | POA: Diagnosis not present

## 2021-09-15 DIAGNOSIS — D509 Iron deficiency anemia, unspecified: Secondary | ICD-10-CM | POA: Diagnosis not present

## 2021-09-15 DIAGNOSIS — N2589 Other disorders resulting from impaired renal tubular function: Secondary | ICD-10-CM | POA: Diagnosis not present

## 2021-09-15 DIAGNOSIS — Z992 Dependence on renal dialysis: Secondary | ICD-10-CM | POA: Diagnosis not present

## 2021-09-18 DIAGNOSIS — N2589 Other disorders resulting from impaired renal tubular function: Secondary | ICD-10-CM | POA: Diagnosis not present

## 2021-09-18 DIAGNOSIS — N186 End stage renal disease: Secondary | ICD-10-CM | POA: Diagnosis not present

## 2021-09-18 DIAGNOSIS — Z992 Dependence on renal dialysis: Secondary | ICD-10-CM | POA: Diagnosis not present

## 2021-09-18 DIAGNOSIS — D631 Anemia in chronic kidney disease: Secondary | ICD-10-CM | POA: Diagnosis not present

## 2021-09-18 DIAGNOSIS — D509 Iron deficiency anemia, unspecified: Secondary | ICD-10-CM | POA: Diagnosis not present

## 2021-09-18 DIAGNOSIS — N2581 Secondary hyperparathyroidism of renal origin: Secondary | ICD-10-CM | POA: Diagnosis not present

## 2021-09-19 DIAGNOSIS — D509 Iron deficiency anemia, unspecified: Secondary | ICD-10-CM | POA: Diagnosis not present

## 2021-09-19 DIAGNOSIS — N2589 Other disorders resulting from impaired renal tubular function: Secondary | ICD-10-CM | POA: Diagnosis not present

## 2021-09-19 DIAGNOSIS — Z136 Encounter for screening for cardiovascular disorders: Secondary | ICD-10-CM | POA: Diagnosis not present

## 2021-09-19 DIAGNOSIS — N189 Chronic kidney disease, unspecified: Secondary | ICD-10-CM | POA: Diagnosis not present

## 2021-09-19 DIAGNOSIS — N2889 Other specified disorders of kidney and ureter: Secondary | ICD-10-CM | POA: Diagnosis not present

## 2021-09-19 DIAGNOSIS — D631 Anemia in chronic kidney disease: Secondary | ICD-10-CM | POA: Diagnosis not present

## 2021-09-19 DIAGNOSIS — Z01818 Encounter for other preprocedural examination: Secondary | ICD-10-CM | POA: Diagnosis not present

## 2021-09-19 DIAGNOSIS — Z992 Dependence on renal dialysis: Secondary | ICD-10-CM | POA: Diagnosis not present

## 2021-09-19 DIAGNOSIS — N2581 Secondary hyperparathyroidism of renal origin: Secondary | ICD-10-CM | POA: Diagnosis not present

## 2021-09-19 DIAGNOSIS — I151 Hypertension secondary to other renal disorders: Secondary | ICD-10-CM | POA: Diagnosis not present

## 2021-09-19 DIAGNOSIS — R000 Tachycardia, unspecified: Secondary | ICD-10-CM | POA: Diagnosis not present

## 2021-09-19 DIAGNOSIS — N186 End stage renal disease: Secondary | ICD-10-CM | POA: Diagnosis not present

## 2021-09-19 DIAGNOSIS — R9439 Abnormal result of other cardiovascular function study: Secondary | ICD-10-CM | POA: Diagnosis not present

## 2021-09-21 DIAGNOSIS — N2581 Secondary hyperparathyroidism of renal origin: Secondary | ICD-10-CM | POA: Diagnosis not present

## 2021-09-21 DIAGNOSIS — N2589 Other disorders resulting from impaired renal tubular function: Secondary | ICD-10-CM | POA: Diagnosis not present

## 2021-09-21 DIAGNOSIS — N186 End stage renal disease: Secondary | ICD-10-CM | POA: Diagnosis not present

## 2021-09-21 DIAGNOSIS — D509 Iron deficiency anemia, unspecified: Secondary | ICD-10-CM | POA: Diagnosis not present

## 2021-09-21 DIAGNOSIS — D631 Anemia in chronic kidney disease: Secondary | ICD-10-CM | POA: Diagnosis not present

## 2021-09-21 DIAGNOSIS — M1040 Other secondary gout, unspecified site: Secondary | ICD-10-CM | POA: Diagnosis not present

## 2021-09-21 DIAGNOSIS — Z992 Dependence on renal dialysis: Secondary | ICD-10-CM | POA: Diagnosis not present

## 2021-09-22 DIAGNOSIS — N2589 Other disorders resulting from impaired renal tubular function: Secondary | ICD-10-CM | POA: Diagnosis not present

## 2021-09-22 DIAGNOSIS — D631 Anemia in chronic kidney disease: Secondary | ICD-10-CM | POA: Diagnosis not present

## 2021-09-22 DIAGNOSIS — Z992 Dependence on renal dialysis: Secondary | ICD-10-CM | POA: Diagnosis not present

## 2021-09-22 DIAGNOSIS — N186 End stage renal disease: Secondary | ICD-10-CM | POA: Diagnosis not present

## 2021-09-22 DIAGNOSIS — N2581 Secondary hyperparathyroidism of renal origin: Secondary | ICD-10-CM | POA: Diagnosis not present

## 2021-09-22 DIAGNOSIS — D509 Iron deficiency anemia, unspecified: Secondary | ICD-10-CM | POA: Diagnosis not present

## 2021-09-25 DIAGNOSIS — N2581 Secondary hyperparathyroidism of renal origin: Secondary | ICD-10-CM | POA: Diagnosis not present

## 2021-09-25 DIAGNOSIS — D509 Iron deficiency anemia, unspecified: Secondary | ICD-10-CM | POA: Diagnosis not present

## 2021-09-25 DIAGNOSIS — N2589 Other disorders resulting from impaired renal tubular function: Secondary | ICD-10-CM | POA: Diagnosis not present

## 2021-09-25 DIAGNOSIS — N186 End stage renal disease: Secondary | ICD-10-CM | POA: Diagnosis not present

## 2021-09-25 DIAGNOSIS — Z992 Dependence on renal dialysis: Secondary | ICD-10-CM | POA: Diagnosis not present

## 2021-09-25 DIAGNOSIS — D631 Anemia in chronic kidney disease: Secondary | ICD-10-CM | POA: Diagnosis not present

## 2021-09-26 DIAGNOSIS — N2589 Other disorders resulting from impaired renal tubular function: Secondary | ICD-10-CM | POA: Diagnosis not present

## 2021-09-26 DIAGNOSIS — D631 Anemia in chronic kidney disease: Secondary | ICD-10-CM | POA: Diagnosis not present

## 2021-09-26 DIAGNOSIS — N2581 Secondary hyperparathyroidism of renal origin: Secondary | ICD-10-CM | POA: Diagnosis not present

## 2021-09-26 DIAGNOSIS — D509 Iron deficiency anemia, unspecified: Secondary | ICD-10-CM | POA: Diagnosis not present

## 2021-09-26 DIAGNOSIS — N186 End stage renal disease: Secondary | ICD-10-CM | POA: Diagnosis not present

## 2021-09-26 DIAGNOSIS — Z992 Dependence on renal dialysis: Secondary | ICD-10-CM | POA: Diagnosis not present

## 2021-09-28 DIAGNOSIS — N2581 Secondary hyperparathyroidism of renal origin: Secondary | ICD-10-CM | POA: Diagnosis not present

## 2021-09-28 DIAGNOSIS — D509 Iron deficiency anemia, unspecified: Secondary | ICD-10-CM | POA: Diagnosis not present

## 2021-09-28 DIAGNOSIS — N2589 Other disorders resulting from impaired renal tubular function: Secondary | ICD-10-CM | POA: Diagnosis not present

## 2021-09-28 DIAGNOSIS — N186 End stage renal disease: Secondary | ICD-10-CM | POA: Diagnosis not present

## 2021-09-28 DIAGNOSIS — Z992 Dependence on renal dialysis: Secondary | ICD-10-CM | POA: Diagnosis not present

## 2021-09-28 DIAGNOSIS — D631 Anemia in chronic kidney disease: Secondary | ICD-10-CM | POA: Diagnosis not present

## 2021-09-29 DIAGNOSIS — D509 Iron deficiency anemia, unspecified: Secondary | ICD-10-CM | POA: Diagnosis not present

## 2021-09-29 DIAGNOSIS — N2589 Other disorders resulting from impaired renal tubular function: Secondary | ICD-10-CM | POA: Diagnosis not present

## 2021-09-29 DIAGNOSIS — Z992 Dependence on renal dialysis: Secondary | ICD-10-CM | POA: Diagnosis not present

## 2021-09-29 DIAGNOSIS — N2581 Secondary hyperparathyroidism of renal origin: Secondary | ICD-10-CM | POA: Diagnosis not present

## 2021-09-29 DIAGNOSIS — D631 Anemia in chronic kidney disease: Secondary | ICD-10-CM | POA: Diagnosis not present

## 2021-09-29 DIAGNOSIS — N186 End stage renal disease: Secondary | ICD-10-CM | POA: Diagnosis not present

## 2021-10-02 DIAGNOSIS — N2581 Secondary hyperparathyroidism of renal origin: Secondary | ICD-10-CM | POA: Diagnosis not present

## 2021-10-02 DIAGNOSIS — D509 Iron deficiency anemia, unspecified: Secondary | ICD-10-CM | POA: Diagnosis not present

## 2021-10-02 DIAGNOSIS — N186 End stage renal disease: Secondary | ICD-10-CM | POA: Diagnosis not present

## 2021-10-02 DIAGNOSIS — D631 Anemia in chronic kidney disease: Secondary | ICD-10-CM | POA: Diagnosis not present

## 2021-10-02 DIAGNOSIS — N2589 Other disorders resulting from impaired renal tubular function: Secondary | ICD-10-CM | POA: Diagnosis not present

## 2021-10-02 DIAGNOSIS — Z992 Dependence on renal dialysis: Secondary | ICD-10-CM | POA: Diagnosis not present

## 2021-10-03 DIAGNOSIS — N186 End stage renal disease: Secondary | ICD-10-CM | POA: Diagnosis not present

## 2021-10-03 DIAGNOSIS — N2589 Other disorders resulting from impaired renal tubular function: Secondary | ICD-10-CM | POA: Diagnosis not present

## 2021-10-03 DIAGNOSIS — Z992 Dependence on renal dialysis: Secondary | ICD-10-CM | POA: Diagnosis not present

## 2021-10-03 DIAGNOSIS — D631 Anemia in chronic kidney disease: Secondary | ICD-10-CM | POA: Diagnosis not present

## 2021-10-03 DIAGNOSIS — N2581 Secondary hyperparathyroidism of renal origin: Secondary | ICD-10-CM | POA: Diagnosis not present

## 2021-10-03 DIAGNOSIS — D509 Iron deficiency anemia, unspecified: Secondary | ICD-10-CM | POA: Diagnosis not present

## 2021-10-05 DIAGNOSIS — N2581 Secondary hyperparathyroidism of renal origin: Secondary | ICD-10-CM | POA: Diagnosis not present

## 2021-10-05 DIAGNOSIS — D631 Anemia in chronic kidney disease: Secondary | ICD-10-CM | POA: Diagnosis not present

## 2021-10-05 DIAGNOSIS — N2589 Other disorders resulting from impaired renal tubular function: Secondary | ICD-10-CM | POA: Diagnosis not present

## 2021-10-05 DIAGNOSIS — Z992 Dependence on renal dialysis: Secondary | ICD-10-CM | POA: Diagnosis not present

## 2021-10-05 DIAGNOSIS — N186 End stage renal disease: Secondary | ICD-10-CM | POA: Diagnosis not present

## 2021-10-05 DIAGNOSIS — D509 Iron deficiency anemia, unspecified: Secondary | ICD-10-CM | POA: Diagnosis not present

## 2021-10-06 DIAGNOSIS — Z992 Dependence on renal dialysis: Secondary | ICD-10-CM | POA: Diagnosis not present

## 2021-10-06 DIAGNOSIS — D509 Iron deficiency anemia, unspecified: Secondary | ICD-10-CM | POA: Diagnosis not present

## 2021-10-06 DIAGNOSIS — D631 Anemia in chronic kidney disease: Secondary | ICD-10-CM | POA: Diagnosis not present

## 2021-10-06 DIAGNOSIS — N2581 Secondary hyperparathyroidism of renal origin: Secondary | ICD-10-CM | POA: Diagnosis not present

## 2021-10-06 DIAGNOSIS — N186 End stage renal disease: Secondary | ICD-10-CM | POA: Diagnosis not present

## 2021-10-06 DIAGNOSIS — N2589 Other disorders resulting from impaired renal tubular function: Secondary | ICD-10-CM | POA: Diagnosis not present

## 2021-10-09 DIAGNOSIS — N186 End stage renal disease: Secondary | ICD-10-CM | POA: Diagnosis not present

## 2021-10-09 DIAGNOSIS — N2581 Secondary hyperparathyroidism of renal origin: Secondary | ICD-10-CM | POA: Diagnosis not present

## 2021-10-09 DIAGNOSIS — N2589 Other disorders resulting from impaired renal tubular function: Secondary | ICD-10-CM | POA: Diagnosis not present

## 2021-10-09 DIAGNOSIS — D509 Iron deficiency anemia, unspecified: Secondary | ICD-10-CM | POA: Diagnosis not present

## 2021-10-09 DIAGNOSIS — Z992 Dependence on renal dialysis: Secondary | ICD-10-CM | POA: Diagnosis not present

## 2021-10-09 DIAGNOSIS — D631 Anemia in chronic kidney disease: Secondary | ICD-10-CM | POA: Diagnosis not present

## 2021-10-10 ENCOUNTER — Telehealth: Payer: Self-pay | Admitting: Nurse Practitioner

## 2021-10-10 NOTE — Chronic Care Management (AMB) (Signed)
°  Chronic Care Management   Note  10/10/2021 Name: Eric Douglas MRN: [id number] DOB: 14-Aug-1971  Eric Douglas is a 50 y.o. year old male who is a primary care patient of Michela Pitcher, NP. I reached out to [name] by phone today in response to a referral sent by Eric Douglas's PCP, Michela Pitcher, NP.   Eric Douglas was given information about Chronic Care Management services today including:  CCM service includes personalized support from designated clinical staff supervised by his physician, including individualized plan of care and coordination with other care providers 24/7 contact phone numbers for assistance for urgent and routine care needs. Service will only be billed when office clinical staff spend 20 minutes or more in a month to coordinate care. Only one practitioner may furnish and bill the service in a calendar month. The patient may stop CCM services at any time (effective at the end of the month) by phone call to the office staff.   Patient agreed to services and verbal consent obtained.   Follow up plan: PT AWARE DEDUCTIBLE   [place]

## 2021-10-11 DIAGNOSIS — Z992 Dependence on renal dialysis: Secondary | ICD-10-CM | POA: Diagnosis not present

## 2021-10-11 DIAGNOSIS — D509 Iron deficiency anemia, unspecified: Secondary | ICD-10-CM | POA: Diagnosis not present

## 2021-10-11 DIAGNOSIS — N2581 Secondary hyperparathyroidism of renal origin: Secondary | ICD-10-CM | POA: Diagnosis not present

## 2021-10-11 DIAGNOSIS — D631 Anemia in chronic kidney disease: Secondary | ICD-10-CM | POA: Diagnosis not present

## 2021-10-11 DIAGNOSIS — N186 End stage renal disease: Secondary | ICD-10-CM | POA: Diagnosis not present

## 2021-10-11 DIAGNOSIS — N2589 Other disorders resulting from impaired renal tubular function: Secondary | ICD-10-CM | POA: Diagnosis not present

## 2021-10-12 DIAGNOSIS — N2589 Other disorders resulting from impaired renal tubular function: Secondary | ICD-10-CM | POA: Diagnosis not present

## 2021-10-12 DIAGNOSIS — D631 Anemia in chronic kidney disease: Secondary | ICD-10-CM | POA: Diagnosis not present

## 2021-10-12 DIAGNOSIS — D509 Iron deficiency anemia, unspecified: Secondary | ICD-10-CM | POA: Diagnosis not present

## 2021-10-12 DIAGNOSIS — N186 End stage renal disease: Secondary | ICD-10-CM | POA: Diagnosis not present

## 2021-10-12 DIAGNOSIS — N2581 Secondary hyperparathyroidism of renal origin: Secondary | ICD-10-CM | POA: Diagnosis not present

## 2021-10-12 DIAGNOSIS — Z992 Dependence on renal dialysis: Secondary | ICD-10-CM | POA: Diagnosis not present

## 2021-10-13 DIAGNOSIS — N2581 Secondary hyperparathyroidism of renal origin: Secondary | ICD-10-CM | POA: Diagnosis not present

## 2021-10-13 DIAGNOSIS — D631 Anemia in chronic kidney disease: Secondary | ICD-10-CM | POA: Diagnosis not present

## 2021-10-13 DIAGNOSIS — N2589 Other disorders resulting from impaired renal tubular function: Secondary | ICD-10-CM | POA: Diagnosis not present

## 2021-10-13 DIAGNOSIS — D509 Iron deficiency anemia, unspecified: Secondary | ICD-10-CM | POA: Diagnosis not present

## 2021-10-13 DIAGNOSIS — Z992 Dependence on renal dialysis: Secondary | ICD-10-CM | POA: Diagnosis not present

## 2021-10-13 DIAGNOSIS — N186 End stage renal disease: Secondary | ICD-10-CM | POA: Diagnosis not present

## 2021-10-15 DIAGNOSIS — N186 End stage renal disease: Secondary | ICD-10-CM | POA: Diagnosis not present

## 2021-10-15 DIAGNOSIS — D631 Anemia in chronic kidney disease: Secondary | ICD-10-CM | POA: Diagnosis not present

## 2021-10-15 DIAGNOSIS — Z992 Dependence on renal dialysis: Secondary | ICD-10-CM | POA: Diagnosis not present

## 2021-10-15 DIAGNOSIS — Z79899 Other long term (current) drug therapy: Secondary | ICD-10-CM | POA: Diagnosis not present

## 2021-10-15 DIAGNOSIS — N028 Recurrent and persistent hematuria with other morphologic changes: Secondary | ICD-10-CM | POA: Diagnosis not present

## 2021-10-15 DIAGNOSIS — E440 Moderate protein-calorie malnutrition: Secondary | ICD-10-CM | POA: Diagnosis not present

## 2021-10-15 DIAGNOSIS — D509 Iron deficiency anemia, unspecified: Secondary | ICD-10-CM | POA: Diagnosis not present

## 2021-10-15 DIAGNOSIS — I120 Hypertensive chronic kidney disease with stage 5 chronic kidney disease or end stage renal disease: Secondary | ICD-10-CM | POA: Diagnosis not present

## 2021-10-15 DIAGNOSIS — N2581 Secondary hyperparathyroidism of renal origin: Secondary | ICD-10-CM | POA: Diagnosis not present

## 2021-10-17 DIAGNOSIS — D509 Iron deficiency anemia, unspecified: Secondary | ICD-10-CM | POA: Diagnosis not present

## 2021-10-17 DIAGNOSIS — D631 Anemia in chronic kidney disease: Secondary | ICD-10-CM | POA: Diagnosis not present

## 2021-10-17 DIAGNOSIS — Z79899 Other long term (current) drug therapy: Secondary | ICD-10-CM | POA: Diagnosis not present

## 2021-10-17 DIAGNOSIS — N186 End stage renal disease: Secondary | ICD-10-CM | POA: Diagnosis not present

## 2021-10-17 DIAGNOSIS — N2581 Secondary hyperparathyroidism of renal origin: Secondary | ICD-10-CM | POA: Diagnosis not present

## 2021-10-17 DIAGNOSIS — Z992 Dependence on renal dialysis: Secondary | ICD-10-CM | POA: Diagnosis not present

## 2021-10-19 DIAGNOSIS — Z79899 Other long term (current) drug therapy: Secondary | ICD-10-CM | POA: Diagnosis not present

## 2021-10-19 DIAGNOSIS — N186 End stage renal disease: Secondary | ICD-10-CM | POA: Diagnosis not present

## 2021-10-19 DIAGNOSIS — Z992 Dependence on renal dialysis: Secondary | ICD-10-CM | POA: Diagnosis not present

## 2021-10-19 DIAGNOSIS — N2581 Secondary hyperparathyroidism of renal origin: Secondary | ICD-10-CM | POA: Diagnosis not present

## 2021-10-19 DIAGNOSIS — D509 Iron deficiency anemia, unspecified: Secondary | ICD-10-CM | POA: Diagnosis not present

## 2021-10-19 DIAGNOSIS — D631 Anemia in chronic kidney disease: Secondary | ICD-10-CM | POA: Diagnosis not present

## 2021-10-20 DIAGNOSIS — Z79899 Other long term (current) drug therapy: Secondary | ICD-10-CM | POA: Diagnosis not present

## 2021-10-20 DIAGNOSIS — D631 Anemia in chronic kidney disease: Secondary | ICD-10-CM | POA: Diagnosis not present

## 2021-10-20 DIAGNOSIS — D509 Iron deficiency anemia, unspecified: Secondary | ICD-10-CM | POA: Diagnosis not present

## 2021-10-20 DIAGNOSIS — N186 End stage renal disease: Secondary | ICD-10-CM | POA: Diagnosis not present

## 2021-10-20 DIAGNOSIS — Z992 Dependence on renal dialysis: Secondary | ICD-10-CM | POA: Diagnosis not present

## 2021-10-20 DIAGNOSIS — M1040 Other secondary gout, unspecified site: Secondary | ICD-10-CM | POA: Diagnosis not present

## 2021-10-20 DIAGNOSIS — N2581 Secondary hyperparathyroidism of renal origin: Secondary | ICD-10-CM | POA: Diagnosis not present

## 2021-10-22 ENCOUNTER — Other Ambulatory Visit: Payer: Self-pay

## 2021-10-22 ENCOUNTER — Emergency Department (HOSPITAL_BASED_OUTPATIENT_CLINIC_OR_DEPARTMENT_OTHER): Payer: Medicare Other

## 2021-10-22 ENCOUNTER — Observation Stay (HOSPITAL_BASED_OUTPATIENT_CLINIC_OR_DEPARTMENT_OTHER)
Admission: EM | Admit: 2021-10-22 | Discharge: 2021-10-23 | Disposition: A | Discharge status: Home / Self Care | Payer: Medicare Other | Attending: Emergency Medicine | Admitting: Emergency Medicine

## 2021-10-22 ENCOUNTER — Encounter (HOSPITAL_BASED_OUTPATIENT_CLINIC_OR_DEPARTMENT_OTHER): Payer: Self-pay | Admitting: Emergency Medicine

## 2021-10-22 DIAGNOSIS — Z992 Dependence on renal dialysis: Secondary | ICD-10-CM | POA: Diagnosis not present

## 2021-10-22 DIAGNOSIS — I120 Hypertensive chronic kidney disease with stage 5 chronic kidney disease or end stage renal disease: Secondary | ICD-10-CM | POA: Insufficient documentation

## 2021-10-22 DIAGNOSIS — Z20822 Contact with and (suspected) exposure to covid-19: Secondary | ICD-10-CM | POA: Insufficient documentation

## 2021-10-22 DIAGNOSIS — N2581 Secondary hyperparathyroidism of renal origin: Secondary | ICD-10-CM | POA: Diagnosis not present

## 2021-10-22 DIAGNOSIS — Z79899 Other long term (current) drug therapy: Secondary | ICD-10-CM | POA: Insufficient documentation

## 2021-10-22 DIAGNOSIS — N186 End stage renal disease: Secondary | ICD-10-CM | POA: Diagnosis not present

## 2021-10-22 DIAGNOSIS — R079 Chest pain, unspecified: Principal | ICD-10-CM | POA: Diagnosis present

## 2021-10-22 DIAGNOSIS — R7989 Other specified abnormal findings of blood chemistry: Secondary | ICD-10-CM | POA: Diagnosis not present

## 2021-10-22 DIAGNOSIS — I4891 Unspecified atrial fibrillation: Secondary | ICD-10-CM | POA: Diagnosis present

## 2021-10-22 DIAGNOSIS — I340 Nonrheumatic mitral (valve) insufficiency: Secondary | ICD-10-CM | POA: Diagnosis not present

## 2021-10-22 DIAGNOSIS — I10 Essential (primary) hypertension: Secondary | ICD-10-CM | POA: Diagnosis not present

## 2021-10-22 DIAGNOSIS — D631 Anemia in chronic kidney disease: Secondary | ICD-10-CM | POA: Diagnosis not present

## 2021-10-22 DIAGNOSIS — I480 Paroxysmal atrial fibrillation: Secondary | ICD-10-CM | POA: Diagnosis present

## 2021-10-22 DIAGNOSIS — D509 Iron deficiency anemia, unspecified: Secondary | ICD-10-CM | POA: Diagnosis not present

## 2021-10-22 LAB — CBC
HCT: 27.4 % — ABNORMAL LOW (ref 39.0–52.0)
Hemoglobin: 9.3 g/dL — ABNORMAL LOW (ref 13.0–17.0)
MCH: 32.9 pg (ref 26.0–34.0)
MCHC: 33.9 g/dL (ref 30.0–36.0)
MCV: 96.8 fL (ref 80.0–100.0)
Platelets: 131 10*3/uL — ABNORMAL LOW (ref 150–400)
RBC: 2.83 MIL/uL — ABNORMAL LOW (ref 4.22–5.81)
RDW: 14.6 % (ref 11.5–15.5)
WBC: 5.4 10*3/uL (ref 4.0–10.5)
nRBC: 0 % (ref 0.0–0.2)

## 2021-10-22 LAB — TSH: TSH: 2.628 u[IU]/mL (ref 0.350–4.500)

## 2021-10-22 LAB — BASIC METABOLIC PANEL
Anion gap: 12 (ref 5–15)
BUN: 37 mg/dL — ABNORMAL HIGH (ref 6–20)
CO2: 25 mmol/L (ref 22–32)
Calcium: 9.8 mg/dL (ref 8.9–10.3)
Chloride: 98 mmol/L (ref 98–111)
Creatinine, Ser: 9.54 mg/dL — ABNORMAL HIGH (ref 0.61–1.24)
GFR, Estimated: 6 mL/min — ABNORMAL LOW (ref >60)
Glucose, Bld: 77 mg/dL (ref 70–99)
Potassium: 3.9 mmol/L (ref 3.5–5.1)
Sodium: 135 mmol/L (ref 135–145)

## 2021-10-22 LAB — D-DIMER, QUANTITATIVE: D-Dimer, Quant: 0.84 ug/mL-FEU — ABNORMAL HIGH (ref 0.00–0.50)

## 2021-10-22 LAB — RESP PANEL BY RT-PCR (FLU A&B, COVID) ARPGX2
Influenza A by PCR: NEGATIVE
Influenza B by PCR: NEGATIVE
SARS Coronavirus 2 by RT PCR: NEGATIVE

## 2021-10-22 LAB — TROPONIN I (HIGH SENSITIVITY)
Troponin I (High Sensitivity): 30 ng/L — ABNORMAL HIGH (ref <18)
Troponin I (High Sensitivity): 29 ng/L — ABNORMAL HIGH (ref <18)

## 2021-10-22 MED ORDER — CALCIUM POLYCARBOPHIL 625 MG PO TABS
1250.0000 mg | ORAL_TABLET | Freq: Every morning | ORAL | Status: DC
  Administered 2021-10-23: 1250 mg via ORAL
  Filled 2021-10-22: qty 2

## 2021-10-22 MED ORDER — NITROGLYCERIN 0.4 MG SL SUBL
0.4000 mg | SUBLINGUAL_TABLET | SUBLINGUAL | Status: DC | PRN
  Administered 2021-10-22: 0.4 mg via SUBLINGUAL
  Filled 2021-10-22: qty 1

## 2021-10-22 MED ORDER — HEPARIN (PORCINE) 25000 UT/250ML-% IV SOLN
2600.0000 [IU]/h | INTRAVENOUS | Status: DC
  Administered 2021-10-22: 17:00:00 1700 [IU]/h via INTRAVENOUS
  Administered 2021-10-23: 2200 [IU]/h via INTRAVENOUS
  Filled 2021-10-22 (×2): qty 250

## 2021-10-22 MED ORDER — ACETAMINOPHEN 325 MG PO TABS: 650.0000 mg | ORAL_TABLET | ORAL | Status: DC | PRN

## 2021-10-22 MED ORDER — FUROSEMIDE 40 MG PO TABS
40.0000 mg | ORAL_TABLET | Freq: Every morning | ORAL | Status: DC
  Administered 2021-10-23: 40 mg via ORAL
  Filled 2021-10-22: qty 1

## 2021-10-22 MED ORDER — DILTIAZEM HCL-DEXTROSE 125-5 MG/125ML-% IV SOLN (PREMIX)
5.0000 mg/h | INTRAVENOUS | Status: DC
  Administered 2021-10-22 – 2021-10-23 (×2): 5 mg/h via INTRAVENOUS
  Filled 2021-10-22 (×2): qty 125

## 2021-10-22 MED ORDER — DILTIAZEM LOAD VIA INFUSION
10.0000 mg | Freq: Once | INTRAVENOUS | Status: AC
  Administered 2021-10-22: 10 mg via INTRAVENOUS
  Filled 2021-10-22: qty 10

## 2021-10-22 MED ORDER — DILTIAZEM LOAD VIA INFUSION
10.0000 mg | Freq: Once | INTRAVENOUS | Status: DC
  Filled 2021-10-22: qty 10

## 2021-10-22 MED ORDER — HEPARIN BOLUS VIA INFUSION
6000.0000 [IU] | Freq: Once | INTRAVENOUS | Status: AC
  Administered 2021-10-22: 6000 [IU] via INTRAVENOUS

## 2021-10-22 MED ORDER — ALLOPURINOL 100 MG PO TABS
100.0000 mg | ORAL_TABLET | Freq: Every morning | ORAL | Status: DC
  Administered 2021-10-23: 100 mg via ORAL
  Filled 2021-10-22: qty 1

## 2021-10-22 MED ORDER — TAMSULOSIN HCL 0.4 MG PO CAPS
0.4000 mg | ORAL_CAPSULE | Freq: Every morning | ORAL | Status: DC
  Administered 2021-10-23: 0.4 mg via ORAL
  Filled 2021-10-22: qty 1

## 2021-10-22 MED ORDER — SUCROFERRIC OXYHYDROXIDE 500 MG PO CHEW
1000.0000 mg | CHEWABLE_TABLET | Freq: Two times a day (BID) | ORAL | Status: DC
  Administered 2021-10-23: 1000 mg via ORAL
  Filled 2021-10-22 (×3): qty 2

## 2021-10-22 MED ORDER — GABAPENTIN 300 MG PO CAPS
300.0000 mg | ORAL_CAPSULE | Freq: Every day | ORAL | Status: DC
  Administered 2021-10-22: 300 mg via ORAL
  Filled 2021-10-22: qty 1

## 2021-10-22 MED ORDER — DILTIAZEM HCL-DEXTROSE 125-5 MG/125ML-% IV SOLN (PREMIX): 5.0000 mg/h | INTRAVENOUS | Status: DC

## 2021-10-22 MED ORDER — NITROGLYCERIN 0.4 MG/SPRAY TL SOLN: 1.0000 | Status: DC | PRN

## 2021-10-22 MED ORDER — CALCITRIOL 0.5 MCG PO CAPS
0.5000 ug | ORAL_CAPSULE | Freq: Every morning | ORAL | Status: DC
  Administered 2021-10-23: 0.5 ug via ORAL
  Filled 2021-10-22: qty 1

## 2021-10-22 MED ORDER — NEBIVOLOL HCL 10 MG PO TABS
10.0000 mg | ORAL_TABLET | Freq: Every morning | ORAL | Status: DC
  Administered 2021-10-23: 10 mg via ORAL
  Filled 2021-10-22: qty 1

## 2021-10-22 MED ORDER — ONDANSETRON HCL 4 MG/2ML IJ SOLN: 4.0000 mg | Freq: Four times a day (QID) | INTRAMUSCULAR | Status: DC | PRN

## 2021-10-22 NOTE — Plan of Care (Signed)
°  Problem: Cardiac: Goal: Ability to achieve and maintain adequate cardiopulmonary perfusion will improve Outcome: Progressing   Problem: Education: Goal: Knowledge of General Education information will improve Description: Including pain rating scale, medication(s)/side effects and non-pharmacologic comfort measures Outcome: Progressing   Problem: Clinical Measurements: Goal: Ability to maintain clinical measurements within normal limits will improve Outcome: Progressing   Problem: Pain Managment: Goal: General experience of comfort will improve Outcome: Progressing   Problem: Safety: Goal: Ability to remain free from injury will improve Outcome: Progressing

## 2021-10-22 NOTE — ED Notes (Signed)
Report given to admission RN.  All questions answered.

## 2021-10-22 NOTE — H&P (Addendum)
History and Physical    [name] JEH:[id number] DOB: 09/14/1971 DOA: 10/22/2021  PCP: Michela Pitcher, NP  Patient coming from: Home  I have personally briefly reviewed patient's old medical records in [facility]  Chief Complaint: CP  HPI: Eric Douglas is a 51 y.o. 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  Pt presents to ED with c/o L sided CP onset yesterday.  CP constant, pressure like sensation.  To left neck and L arm intermittently.  Lying on left side makes pain worse.  No h/o CAD nor PE.  Recently got annual cardiac studies at end of 2022 as he is on transplant list waiting for kidney at [facility].  Most recent cardiac studies: Low risk stress test in 20-Oct-2023 - EF 46% with mild global hypokinesis. Echo in Nov = Low normal EF 50-55%, mod-severe MVR, mod TR  On ROS: pt been having intermittent palpitations ever since he had COVID late summer of 2022.   ED Course: Given NTG with relief of CP.  Has new onset A.Fib RVR with rates in the 120s-130  Started on Cardizem gtt.  Trops 30 and 29.   Review of Systems: As per HPI, otherwise all review of systems negative.  Past Medical History:  Diagnosis Date   Fibula fracture    ORIF 1990   Gout    non crystal proven    History of chickenpox    History of kidney disease    HTN (hypertension)    Nephropathy    Ig A; dx 2002. micropscopic hematuria and proteinuria   Tibia fracture    ORIF 1990   Varicocele    L testes   Venous incompetence    R leg    Past Surgical History:  Procedure Laterality Date   APPENDECTOMY  1982   AV FISTULA PLACEMENT Left 06/27/2017   Procedure: LEFT ARM ARTERIOVENOUS (AV) FISTULA CREATION;  Surgeon: Waynetta Sandy, MD;  Location: [facility];  Service: Vascular;  Laterality: Left;   stab phlebectomy Left 12/17/2016   stab phlebectomy >20 incisions (left leg) by Tinnie Gens MD      reports that he has  never smoked. He has never used smokeless tobacco. He reports that he does not drink alcohol and does not use drugs.  No Known Allergies  Family History  Problem Relation Age of Onset   Cancer Paternal Grandfather 17       brain cancer   Hypertension Other        GP   Stroke Other    Diabetes Other        DM - GP    Heart disease Neg Hx      Prior to Admission medications   Medication Sig Start Date End Date Taking? Authorizing Provider  allopurinol (ZYLOPRIM) 100 MG tablet Take 100 mg by mouth daily as needed (gout).  11/16/15   [provider]  amLODipine (NORVASC) 10 MG tablet Take 10 mg by mouth daily.  03/13/17   [provider]  BYSTOLIC 10 MG tablet Take 10 mg by mouth daily.  05/22/17   [provider]  calcium acetate (PHOSLO) 667 MG tablet Take 1 tablet by mouth 3 (three) times daily with meals. 12/24/17   [provider]  furosemide (LASIX) 40 MG tablet Take 40 mg by mouth every other day.  03/13/17   [provider]  gabapentin (NEURONTIN) 100 MG capsule Take 300 mg by  mouth at bedtime.    [provider]  tamsulosin (FLOMAX) 0.4 MG CAPS capsule TAKE 1 CAPSULE BY MOUTH EVERY DAY 02/10/18   Jearld Fenton, NP  testosterone cypionate (DEPOTESTOSTERONE CYPIONATE) 200 MG/ML injection INJECT 1 ML (200 MG TOTAL) INTO THE MUSCLE EVERY 14 (FOURTEEN) DAYS. MUST SCHEDULE ANNUAL EXAM 06/24/19   Jearld Fenton, NP    Physical Exam: Vitals:   10/22/21 1815 10/22/21 1830 10/22/21 1900 10/22/21 1935  BP: (!) 155/94 (!) 165/138 (!) 154/87 (!) 147/76  Pulse: 69 67 67 66  Resp: 13 19 20 17   Temp:      TempSrc:      SpO2: 97% 97% 97% 99%  Weight:      Height:        Constitutional: NAD, calm, comfortable Eyes: PERRL, lids and conjunctivae normal ENMT: Mucous membranes are moist. Posterior pharynx clear of any exudate or lesions.Normal dentition.  Neck: normal, supple, no masses, no thyromegaly Respiratory: clear to auscultation  bilaterally, no wheezing, no crackles. Normal respiratory effort. No accessory muscle use.  Cardiovascular: RRR Abdomen: no tenderness, no masses palpated. No hepatosplenomegaly. Bowel sounds positive.  Musculoskeletal: no clubbing / cyanosis. No joint deformity upper and lower extremities. Good ROM, no contractures. Normal muscle tone.  Skin: no rashes, lesions, ulcers. No induration Neurologic: CN 2-12 grossly intact. Sensation intact, DTR normal. Strength 5/5 in all 4.  Psychiatric: Normal judgment and insight. Alert and oriented x 3. Normal mood.    Labs on Admission: I have personally reviewed following labs and imaging studies  CBC: Recent Labs  Lab 10/22/21 1430  WBC 5.4  HGB 9.3*  HCT 27.4*  MCV 96.8  PLT 546*   Basic Metabolic Panel: Recent Labs  Lab 10/22/21 1430  NA 135  K 3.9  CL 98  CO2 25  GLUCOSE 77  BUN 37*  CREATININE 9.54*  CALCIUM 9.8   GFR: Estimated Creatinine Clearance: 14.2 mL/min (A) (by C-G formula based on SCr of 9.54 mg/dL (H)). Liver Function Tests: No results for input(s): AST, ALT, ALKPHOS, BILITOT, PROT, ALBUMIN in the last 168 hours. No results for input(s): LIPASE, AMYLASE in the last 168 hours. No results for input(s): AMMONIA in the last 168 hours. Coagulation Profile: No results for input(s): INR, PROTIME in the last 168 hours. Cardiac Enzymes: No results for input(s): CKTOTAL, CKMB, CKMBINDEX, TROPONINI in the last 168 hours. BNP (last 3 results) No results for input(s): PROBNP in the last 8760 hours. HbA1C: No results for input(s): HGBA1C in the last 72 hours. CBG: No results for input(s): GLUCAP in the last 168 hours. Lipid Profile: No results for input(s): CHOL, HDL, LDLCALC, TRIG, CHOLHDL, LDLDIRECT in the last 72 hours. Thyroid Function Tests: No results for input(s): TSH, T4TOTAL, FREET4, T3FREE, THYROIDAB in the last 72 hours. Anemia Panel: No results for input(s): VITAMINB12, FOLATE, FERRITIN, TIBC, IRON, RETICCTPCT  in the last 72 hours. Urine analysis:    Component Value Date/Time   COLORURINE YELLOW 06/14/2019 [place] 06/14/2019 0951   LABSPEC 1.025 06/14/2019 0951   PHURINE 5.5 06/14/2019 0951   GLUCOSEU NEGATIVE 06/14/2019 0951   HGBUR MODERATE (A) 06/14/2019 0951   BILIRUBINUR NEGATIVE 06/14/2019 0951   BILIRUBINUR neg 01/13/2018 1108   [place] 06/14/2019 0951   PROTEINUR 100 (A) 06/14/2019 0951   UROBILINOGEN 0.2 01/13/2018 1108   NITRITE NEGATIVE 06/14/2019 0951   LEUKOCYTESUR NEGATIVE 06/14/2019 0951    Radiological Exams on Admission: DG Chest 2 View  Result Date:  10/22/2021 CLINICAL DATA:  Chest pain EXAM: CHEST - 2 VIEW COMPARISON:  None. FINDINGS: The heart size and mediastinal contours are within normal limits. Both lungs are clear. The visualized skeletal structures are unremarkable. IMPRESSION: No active cardiopulmonary disease. Electronically Signed   By: Keane Police D.O.   On: 10/22/2021 13:47   US Venous Img Lower Bilateral  Result Date: 10/22/2021 CLINICAL DATA:  Positive D-dimer. EXAM: BILATERAL LOWER EXTREMITY VENOUS DOPPLER ULTRASOUND TECHNIQUE: Gray-scale sonography with compression, as well as color and duplex ultrasound, were performed to evaluate the deep venous system(s) from the level of the common femoral vein through the popliteal and proximal calf veins. COMPARISON:  None. FINDINGS: VENOUS Normal compressibility of the common femoral, superficial femoral, and popliteal veins, as well as the visualized calf veins. Visualized portions of profunda femoral vein and great saphenous vein unremarkable. No filling defects to suggest DVT on grayscale or color Doppler imaging. Doppler waveforms show normal direction of venous flow, normal respiratory plasticity and response to augmentation. Limited views of the contralateral common femoral vein are unremarkable. OTHER None. Limitations: none IMPRESSION: Negative. Electronically Signed   By: Dorise Bullion III M.D.   On: 10/22/2021 17:44    EKG: Independently reviewed.  Assessment/Plan Principal Problem:   New onset a-fib ([place]) Active Problems:   ESRD (end stage renal disease) (HCC)   Chest pain   Atrial fibrillation with RVR (HCC)   HTN (hypertension)    New onset A.Fib RVR - A.fib pathway Rate control with cardizem gtt Currently NSR Heparin gtt 2d echo MVR - (also mod TVR) Mod-severe on echo in Nov 2022 Seems to be new from what I can tell. Getting repeat Echo ? If the MVR is the cause of his CP, especially given improvement in symptoms with NTG and low risk stress test just last month. ? If the MVR, TVR contributing to atrial dysfunction and his new onset A.fib. MVR is known to present as PAF Will put in for cards consult in AM. ? If this patient needs valve surgery? Has cards follow up with his cardiologist (not [facility] related) scheduled for the 23rd. HTN - Home med rec pending Cardizem gtt for #1  ESRD - Call nephro if not discharged tomorrow for IP dialysis on TTS  DVT prophylaxis: heparin gtt Code Status: Full Family Communication: No family in room Disposition Plan: Home after cardiac work up [employer] called: None Admission status: Place in obs  Severity of Illness: The appropriate patient status for this patient is OBSERVATION. Observation status is judged to be reasonable and necessary in order to provide the required intensity of service to ensure the patient's safety. The patient's presenting symptoms, physical exam findings, and initial radiographic and laboratory data in the context of their medical condition is felt to place them at decreased risk for further clinical deterioration. Furthermore, it is anticipated that the patient will be medically stable for discharge from the hospital within 2 midnights of admission.    Prudencio Velazco Jerilynn Mages DO [facility]  How to contact the [facility] Attending or Consulting provider [place] or covering provider  during after hours [place], for this patient?  Check the care team in [facility] and look for a) attending/consulting [facility] provider listed and b) the [facility] team listed Log into [email or web address]  Amion Physician Scheduling and messaging for groups and whole hospitals  On call and physician scheduling software for group practices, residents, hospitalists and other medical providers for call, clinic, rotation and shift schedules. OnCall  [employer] is a hospital-wide system for scheduling doctors and paging doctors on call. EasyPlot is for scientific plotting and data analysis.  [email or web address]  and use [place]'s universal password to access. If you do not have the password, please contact the hospital operator.  Locate the [facility] provider you are looking for under [facility] and page to a number that you can be directly reached. If you still have difficulty reaching the provider, please page the [facility] (Director on Call) for the Hospitalists listed on amion for assistance.  10/22/2021, 8:46 PM

## 2021-10-22 NOTE — ED Provider Notes (Signed)
I personally evaluated the patient during the encounter and completed a history, physical, procedures, medical decision making to contribute to the overall care of the patient and decision making for the patient briefly, the patient is a 51 y.o. male here with chest pain.  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  Still does make some urine.  Chest pain for the last day.  Got better with nitroglycerin here.  EKG originally showed sinus rhythm but he has gone into atrial fibrillation with RVR which appears to be new.  He states that he does have palpitations frequently but has never been diagnosed with atrial fibrillation.  Upon chart review he does appear to have recent stress test/Myoview back in December which was nonischemic.  However he does have some mitral valve disease.  No history of heart failure.  Does not appear to be volume overloaded on exam.  Chest x-ray without pneumonia, this was interpreted by myself.  Lab work was ordered his creatinine appears to be around baseline.  His D-dimer is elevated but will get DVT studies to further evaluate for blood clot.  May need a VQ scan.  However given his new atrial fibrillation with RVR I talked on the phone with Dr. Harrington Challenger of cardiology.  She is okay with starting him on IV diltiazem bolus and infusion.  We will start him on IV heparin as he will need to be anticoagulated given the atrial fibrillation anyways.  Overall have a lower suspicion for ACS given his recent negative stress test.  Suspect may be demand from paroxysmal atrial fibrillation.  COVID test was negative.  Have a lower suspicion for blood clot.  Patient to be admitted to medicine service for further care.  This chart was dictated using voice recognition software.  Despite best efforts to proofread,  errors can occur which can change the documentation meaning.  .Critical Care Performed by: Lennice Sites, DO Authorized by: Lennice Sites, DO   Critical care provider  statement:    Critical care time (minutes):  45   Critical care was necessary to treat or prevent imminent or life-threatening deterioration of the following conditions: atrial fibrillation with rvr.   Critical care was time spent personally by me on the following activities:  Blood draw for specimens, development of treatment plan with patient or surrogate, discussions with consultants, discussions with primary provider, evaluation of patient's response to treatment, examination of patient, obtaining history from patient or surrogate, ordering and review of laboratory studies, ordering and performing treatments and interventions, ordering and review of radiographic studies, pulse oximetry, re-evaluation of patient's condition and review of old charts   Care discussed with: admitting provider     EKG Interpretation  Date/Time:  Sunday October 22 2021 14:10:31 EST Ventricular Rate:  73 PR Interval:  150 QRS Duration: 100 QT Interval:  397 QTC Calculation: 438 R Axis:   68 Text Interpretation: Sinus rhythm Atrial premature complex Confirmed by Lennice Sites ([id number]) on 10/22/2021 3:40:28 PM            Lennice Sites, DO 10/22/21 1649

## 2021-10-22 NOTE — Progress Notes (Signed)
ANTICOAGULATION CONSULT NOTE - Initial Consult  Pharmacy Consult for heparin Indication: afib, VTE rule out  No Known Allergies  Patient Measurements: Height: 6\' 5"  (195.6 cm) Weight: (!) 140.6 kg (310 lb) IBW/kg (Calculated) : 89.1 Heparin Dosing Weight: 120kg  Vital Signs: Temp: 98 F (36.7 C) (01/08 1303) Temp Source: Oral (01/08 1303) BP: 130/87 (01/08 1615) Pulse Rate: 129 (01/08 1615)  Labs: Recent Labs    10/22/21 1430  HGB 9.3*  HCT 27.4*  PLT 131*  CREATININE 9.54*  TROPONINIHS 30*    Estimated Creatinine Clearance: 14.2 mL/min (A) (by C-G formula based on SCr of 9.54 mg/dL (H)).   Medical History: Past Medical History:  Diagnosis Date   Fibula fracture    ORIF 1990   Gout    non crystal proven    History of chickenpox    History of kidney disease    HTN (hypertension)    Nephropathy    Ig A; dx 2002. micropscopic hematuria and proteinuria   Tibia fracture    ORIF 1990   Varicocele    L testes   Venous incompetence    R leg   Assessment: 30 YOM presenting with CP, in afib, also elevated d-dimer with dopplers pending, chronic anemia stable.  He is not on anticoagulation PTA  Goal of Therapy:  Heparin level 0.3-0.7 units/ml Monitor platelets by anticoagulation protocol: Yes   Plan:  Heparin 6000 units IV x 1, and gtt at 1700 units/hr F/u 8 hour heparin level F/u VTE workup and long term [facility] plan for afib  Bertis Ruddy, PharmD Clinical Pharmacist ED Pharmacist Phone # [phone] 10/22/2021 4:32 PM

## 2021-10-22 NOTE — ED Notes (Signed)
Patient transported to [facility] via [facility] at this time.  All personal belongings transported with patient.  ?

## 2021-10-22 NOTE — ED Notes (Signed)
Patient reports no current c/o.  Resting quietly.  Updated on plan of care

## 2021-10-22 NOTE — ED Notes (Signed)
Heart rate noted to be elevated 120's. Notified EDP at 1540

## 2021-10-22 NOTE — ED Notes (Signed)
ED Provider at bedside. 

## 2021-10-22 NOTE — ED Triage Notes (Signed)
LT CP since yesterday, describes as pressure; no relief with indigestion meds; denies other sxs

## 2021-10-22 NOTE — ED Notes (Signed)
Pt

## 2021-10-22 NOTE — ED Notes (Signed)
Pt converted to NSR with rate 75

## 2021-10-22 NOTE — ED Provider Notes (Signed)
[facility] EMERGENCY DEPARTMENT Provider Note   CSN: [id number] Arrival date & time: 10/22/21  1257     History  Chief Complaint  Patient presents with   Chest Pain    Eric Douglas is a 51 y.o. male.   Chest Pain Associated symptoms: nausea and shortness of breath   Associated symptoms: no abdominal pain, no back pain, no cough, no fever, no palpitations and no vomiting    This is a 51 year old male with history of end-stage renal disease, home dialysis T, TH, Sat/Sun (depends, he does it at home), AV fistula left arm, hypertension presenting with chest pain.  It is left-sided chest pain that started acutely yesterday, the pain is been constant.  It feels like pressure, it radiates up his neck into his left shoulder intermittently.  He did try taking Tums for last night, this did not improve the pain.  Lying on his left side makes the pain worse, lying on his stomach seems to improve the pain.  Denies any other provoking or alleviating factors.  No history of MI/blood clots.  No history of cigarette use.  PMH: ESRD on dialysis, IgA nephropathy   Past Medical History:  Diagnosis Date   Fibula fracture    ORIF 1990   Gout    non crystal proven    History of chickenpox    History of kidney disease    HTN (hypertension)    Nephropathy    Ig A; dx 2002. micropscopic hematuria and proteinuria   Tibia fracture    ORIF 1990   Varicocele    L testes   Venous incompetence    R leg     Home Medications Prior to Admission medications   Medication Sig Start Date End Date Taking? Authorizing Provider  allopurinol (ZYLOPRIM) 100 MG tablet Take 100 mg by mouth daily as needed (gout).  11/16/15   [provider]  amLODipine (NORVASC) 10 MG tablet Take 10 mg by mouth daily.  03/13/17   [provider]  BYSTOLIC 10 MG tablet Take 10 mg by mouth daily.  05/22/17   [provider]  calcium acetate (PHOSLO) 667 MG tablet Take 1 tablet by mouth 3  (three) times daily with meals. 12/24/17   [provider]  furosemide (LASIX) 40 MG tablet Take 40 mg by mouth every other day.  03/13/17   [provider]  gabapentin (NEURONTIN) 100 MG capsule Take 300 mg by mouth at bedtime.    [provider]  tamsulosin (FLOMAX) 0.4 MG CAPS capsule TAKE 1 CAPSULE BY MOUTH EVERY DAY 02/10/18   Jearld Fenton, NP  testosterone cypionate (DEPOTESTOSTERONE CYPIONATE) 200 MG/ML injection INJECT 1 ML (200 MG TOTAL) INTO THE MUSCLE EVERY 14 (FOURTEEN) DAYS. MUST SCHEDULE ANNUAL EXAM 06/24/19   Jearld Fenton, NP      Allergies    Patient has no known allergies.    Review of Systems   Review of Systems  Constitutional:  Negative for chills and fever.  HENT:  Negative for ear pain and sore throat.   Eyes:  Negative for pain and visual disturbance.  Respiratory:  Positive for shortness of breath. Negative for cough.   Cardiovascular:  Positive for chest pain. Negative for palpitations.  Gastrointestinal:  Positive for nausea. Negative for abdominal pain and vomiting.  Genitourinary:  Negative for dysuria and hematuria.  Musculoskeletal:  Negative for arthralgias and back pain.  Skin:  Negative for color change and rash.  Neurological:  Negative  for seizures and syncope.  All other systems reviewed and are negative.  Physical Exam Updated Vital Signs BP 130/87    Pulse (!) 129    Temp 98 F (36.7 C) (Oral)    Resp 19    Ht 6\' 5"  (1.956 m)    Wt (!) 140.6 kg    SpO2 97%    BMI 36.76 kg/m  Physical Exam Vitals and nursing note reviewed. Exam conducted with a chaperone present.  Constitutional:      Appearance: Normal appearance. He is obese.  HENT:     Head: Normocephalic and atraumatic.  Eyes:     General: No scleral icterus.       Right eye: No discharge.        Left eye: No discharge.     Extraocular Movements: Extraocular movements intact.     Pupils: Pupils are equal, round, and reactive to light.  Cardiovascular:      Rate and Rhythm: Tachycardia present. Rhythm irregular.     Pulses: Normal pulses.     Heart sounds: Normal heart sounds. No murmur heard.   No friction rub. No gallop.     Comments: Tachycardic with regular rhythm. Pulmonary:     Effort: Pulmonary effort is normal. No respiratory distress.     Breath sounds: Normal breath sounds.     Comments: Lungs are clear to auscultation bilaterally Abdominal:     General: Abdomen is flat. Bowel sounds are normal. There is no distension.     Palpations: Abdomen is soft.     Tenderness: There is no abdominal tenderness.  Musculoskeletal:     Right lower leg: No edema.     Left lower leg: No edema.     Comments: Lower extremities roughly symmetric without any pitting edema  Skin:    General: Skin is warm and dry.     Coloration: Skin is not jaundiced.     Comments: AV fistula to left arm with palpable thrill  Neurological:     Mental Status: He is alert. Mental status is at baseline.     Coordination: Coordination normal.   ED Results / Procedures / Treatments   Labs (all labs ordered are listed, but only abnormal results are displayed) Labs Reviewed  BASIC METABOLIC PANEL - Abnormal; Notable for the following components:      Result Value   BUN 37 (*)    Creatinine, Ser 9.54 (*)    GFR, Estimated 6 (*)    All other components within normal limits  CBC - Abnormal; Notable for the following components:   RBC 2.83 (*)    Hemoglobin 9.3 (*)    HCT 27.4 (*)    Platelets 131 (*)    All other components within normal limits  D-DIMER, QUANTITATIVE - Abnormal; Notable for the following components:   D-Dimer, Quant 0.84 (*)    All other components within normal limits  TROPONIN I (HIGH SENSITIVITY) - Abnormal; Notable for the following components:   Troponin I (High Sensitivity) 30 (*)    All other components within normal limits  RESP PANEL BY RT-PCR (FLU A&B, COVID) ARPGX2  TSH  HEPARIN LEVEL (UNFRACTIONATED)  TROPONIN I (HIGH  SENSITIVITY)    EKG EKG Interpretation  Date/Time:  Sunday October 22 2021 14:10:31 EST Ventricular Rate:  73 PR Interval:  150 QRS Duration: 100 QT Interval:  397 QTC Calculation: 438 R Axis:   68 Text Interpretation: Sinus rhythm Atrial premature complex Confirmed by Ronnald Nian, Adam ([id number]) on 10/22/2021  3:40:28 PM  Radiology DG Chest 2 View  Result Date: 10/22/2021 CLINICAL DATA:  Chest pain EXAM: CHEST - 2 VIEW COMPARISON:  None. FINDINGS: The heart size and mediastinal contours are within normal limits. Both lungs are clear. The visualized skeletal structures are unremarkable. IMPRESSION: No active cardiopulmonary disease. Electronically Signed   By: Keane Police D.O.   On: 10/22/2021 13:47    Procedures .Critical Care Performed by: Sherrill Raring, PA-C Authorized by: Sherrill Raring, PA-C   Critical care provider statement:    Critical care time (minutes):  30   Critical care start time:  10/22/2021 3:30 PM   Critical care end time:  10/22/2021 4:00 PM   Critical care time was exclusive of:  Separately billable procedures and treating other patients   Critical care was necessary to treat or prevent imminent or life-threatening deterioration of the following conditions:  Cardiac failure and renal failure   Critical care was time spent personally by me on the following activities:  Development of treatment plan with patient or surrogate, discussions with consultants, evaluation of patient's response to treatment, examination of patient, ordering and review of laboratory studies, ordering and review of radiographic studies, ordering and performing treatments and interventions, pulse oximetry, re-evaluation of patient's condition and review of old charts   I assumed direction of critical care for this patient from another provider in my specialty: no      Medications Ordered in ED Medications  diltiazem (CARDIZEM) 1 mg/mL load via infusion 10 mg (10 mg Intravenous Bolus from Bag 10/22/21 1632)     And  diltiazem (CARDIZEM) 125 mg in dextrose 5% 125 mL (1 mg/mL) infusion (10 mg/hr Intravenous Rate/Dose Change 10/22/21 1651)  heparin ADULT infusion 100 units/mL (25000 units/213mL) (1,700 Units/hr Intravenous New Bag/Given 10/22/21 1702)  heparin bolus via infusion 6,000 Units (6,000 Units Intravenous Bolus from Bag 10/22/21 1704)    ED Course/ Medical Decision Making/ A&P                           Medical Decision Making Amount and/or Complexity of Data Reviewed External Data Reviewed: labs, radiology, ECG and notes.    Details: Patient had echocardiogram performed 08/22/2021 as part of cardiac pre-op clearnance (kidney transplant candidate) Left Ventricle: Systolic function is low normal. EF: 50-55%.   Left Atrium: Left atrium is mildly dilated.   Mitral Valve: There is moderate to severe regurgitation.   Tricuspid Valve: There is moderate regurgitation. The tricuspid valve  regurgitation jet is directed toward septum.   No prior for comparison.   This is a 51 year old male with history as detailed above presenting due to chest pain x1 day.  Chest pain improved with nitro, initial EKG did not show any signs of ischemia and was normal sinus rhythm.  Subsequent EKG showed A. fib with RVR.  Chest x-ray without any signs pneumonia or cardiomegaly concerning for new onset CHF, labs and imaging were independently interpreted by myself and I agree with radiologist impression.  Labs do not show any significant leukocytosis, no gross electrolyte derangement and creatinine is roughly at baseline compared to previous.  Dimer slightly elevated, suspect secondary to demand ischemia.  Patient will need admission, started on diltiazem and heparin.          Final Clinical Impression(s) / ED Diagnoses Final diagnoses:  Atrial fibrillation, unspecified type ([place])  Atrial fibrillation with RVR ([place])    Rx / DC Orders ED Discharge Orders  None         Sherrill Raring, PA-C 10/22/21 [place], [place], DO 10/22/21 1836

## 2021-10-22 NOTE — ED Notes (Signed)
Attempted to give report to 6 E RN.  Requested to call back in 15 minutes

## 2021-10-22 NOTE — ED Notes (Signed)
US at bedside

## 2021-10-23 ENCOUNTER — Other Ambulatory Visit (HOSPITAL_COMMUNITY): Payer: Self-pay

## 2021-10-23 ENCOUNTER — Observation Stay (HOSPITAL_BASED_OUTPATIENT_CLINIC_OR_DEPARTMENT_OTHER): Payer: Medicare Other

## 2021-10-23 ENCOUNTER — Other Ambulatory Visit: Payer: Self-pay | Admitting: Physician Assistant

## 2021-10-23 DIAGNOSIS — I4891 Unspecified atrial fibrillation: Secondary | ICD-10-CM

## 2021-10-23 DIAGNOSIS — N186 End stage renal disease: Secondary | ICD-10-CM | POA: Diagnosis not present

## 2021-10-23 DIAGNOSIS — I480 Paroxysmal atrial fibrillation: Secondary | ICD-10-CM

## 2021-10-23 DIAGNOSIS — R079 Chest pain, unspecified: Secondary | ICD-10-CM

## 2021-10-23 DIAGNOSIS — I340 Nonrheumatic mitral (valve) insufficiency: Secondary | ICD-10-CM | POA: Diagnosis not present

## 2021-10-23 DIAGNOSIS — I10 Essential (primary) hypertension: Secondary | ICD-10-CM | POA: Diagnosis not present

## 2021-10-23 LAB — ECHOCARDIOGRAM COMPLETE
AR max vel: 2.29 cm2
AV Area VTI: 2.37 cm2
AV Area mean vel: 2.06 cm2
AV Mean grad: 8 mmHg
AV Peak grad: 12.4 mmHg
Ao pk vel: 1.76 m/s
Area-P 1/2: 4.12 cm2
Calc EF: 62.2 %
Height: 77 in
S' Lateral: 4.2 cm
Single Plane A2C EF: 68.4 %
Single Plane A4C EF: 58.6 %
Weight: 5254.4 oz

## 2021-10-23 LAB — CBC
HCT: 25.2 % — ABNORMAL LOW (ref 39.0–52.0)
Hemoglobin: 8.4 g/dL — ABNORMAL LOW (ref 13.0–17.0)
MCH: 32.1 pg (ref 26.0–34.0)
MCHC: 33.3 g/dL (ref 30.0–36.0)
MCV: 96.2 fL (ref 80.0–100.0)
Platelets: 115 10*3/uL — ABNORMAL LOW (ref 150–400)
RBC: 2.62 MIL/uL — ABNORMAL LOW (ref 4.22–5.81)
RDW: 14.3 % (ref 11.5–15.5)
WBC: 5.4 10*3/uL (ref 4.0–10.5)
nRBC: 0 % (ref 0.0–0.2)

## 2021-10-23 LAB — BASIC METABOLIC PANEL
Anion gap: 9 (ref 5–15)
BUN: 39 mg/dL — ABNORMAL HIGH (ref 6–20)
CO2: 25 mmol/L (ref 22–32)
Calcium: 8.8 mg/dL — ABNORMAL LOW (ref 8.9–10.3)
Chloride: 102 mmol/L (ref 98–111)
Creatinine, Ser: 10.89 mg/dL — ABNORMAL HIGH (ref 0.61–1.24)
GFR, Estimated: 5 mL/min — ABNORMAL LOW (ref >60)
Glucose, Bld: 86 mg/dL (ref 70–99)
Potassium: 4 mmol/L (ref 3.5–5.1)
Sodium: 136 mmol/L (ref 135–145)

## 2021-10-23 LAB — HEPARIN LEVEL (UNFRACTIONATED)
Heparin Unfractionated: <0.1 IU/mL — ABNORMAL LOW (ref 0.30–0.70)
Heparin Unfractionated: 0.14 IU/mL — ABNORMAL LOW (ref 0.30–0.70)

## 2021-10-23 LAB — HIV ANTIBODY (ROUTINE TESTING W REFLEX): HIV Screen 4th Generation wRfx: NONREACTIVE

## 2021-10-23 MED ORDER — HEPARIN BOLUS VIA INFUSION
3000.0000 [IU] | Freq: Once | INTRAVENOUS | Status: DC
  Filled 2021-10-23: qty 3000

## 2021-10-23 MED ORDER — APIXABAN 5 MG PO TABS: 5.0000 mg | ORAL_TABLET | Freq: Two times a day (BID) | ORAL | Status: DC

## 2021-10-23 MED ORDER — APIXABAN 2.5 MG PO TABS
2.5000 mg | ORAL_TABLET | Freq: Two times a day (BID) | ORAL | 0 refills | Status: DC
  Filled 2021-10-23: qty 60, 30d supply, fill #0

## 2021-10-23 MED ORDER — METOPROLOL TARTRATE 25 MG PO TABS
25.0000 mg | ORAL_TABLET | Freq: Two times a day (BID) | ORAL | 0 refills | Status: DC
  Filled 2021-10-23: qty 60, 30d supply, fill #0

## 2021-10-23 MED ORDER — METOPROLOL TARTRATE 25 MG PO TABS
25.0000 mg | ORAL_TABLET | Freq: Two times a day (BID) | ORAL | Status: DC
  Administered 2021-10-23: 25 mg via ORAL
  Filled 2021-10-23: qty 1

## 2021-10-23 MED ORDER — APIXABAN 2.5 MG PO TABS
2.5000 mg | ORAL_TABLET | Freq: Two times a day (BID) | ORAL | Status: DC
  Administered 2021-10-23: 2.5 mg via ORAL
  Filled 2021-10-23: qty 1

## 2021-10-23 NOTE — Discharge Summary (Signed)
Eric Douglas VEH:[id number] DOB: 1970/11/19 DOA: 10/22/2021  PCP: Michela Pitcher, NP  Admit date: 10/22/2021 Discharge date: 10/23/2021  Admitted From: Home Disposition: Home  Recommendations for Outpatient Follow-up:  Follow up with PCP in 1 week Please obtain BMP/CBC in one week Please follow up with cardiology as scheduled 30-day monitor will be sent to the     Discharge Condition:Stable CODE STATUS: Full Diet recommendation: Renal diet Brief/Interim Summary: Per EZM:OQHUTML Eric Douglas is a 51 y.o. 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  Pt presents to ED with c/o L sided CP onset yesterday.  CP constant, pressure like sensation.  To left neck and L arm intermittently.  Lying on left side makes pain worse.  No h/o CAD nor PE.  Recently got annual cardiac studies at end of 2022 as he is on transplant list waiting for kidney at [facility].   Most recent cardiac studies: Low risk stress test in 10-10-2023 - EF 46% with mild global hypokinesis. Echo in Nov = Low normal EF 50-55%, mod-severe MVR, mod TR  In the ED patient was found with new onset atrial fibrillation with rapid ventricular response with rates in the 120s and 130s.  He was started on Cardizem drip.  Troponin was mildly elevated.  Cardiology and nephrology were consulted   1.  New onset A. fib RVR Started on Cardizem drip. -NSR now CHADVASC 1 Patient reported palpitations last few month, cardiology arranged 30 day outpatient monitor and f/u with me/afib clinic.  Spoke to Dr. Moshe Cipro , [facility] for patient to start a/c with Eliquis 2.5mg  po bid His bisoprolol was switched to metoprolol 25mg  bid    2.  MR Moderate on todays echo Follow up with cardiology for further management as outpatient    3.  Essential hypertension stable.   4.  End-stage renal disease Nephrology consulted-He is a home HD patient, does HD 4 times per week, usually  Sun-Tues-wed-Thursday. His usual UF is 3- 4 L.  No recent HD or access issues.  Nephrology okay patient for discharge home and to continue his regimen of home HD at home.  5.chest pain Per Cardiology -recent and multiple negative stress test Atypical pain negative troponin no acute ECG changes and TTE with no RWMA. No further inpatient ischemic testing planned. Pleuritic pain with CXR NAD and LE duplex no DVT.     6.  Elevated troponin Mildly elevated Likely due to demand ischemia from A. fib RVR Echo with normal EF and no regional wall motion abnormality  Discharge Diagnoses:  Principal Problem:   Chest pain Active Problems:   ESRD (end stage renal disease) ([place])   New onset a-fib (HCC)   Atrial fibrillation with RVR (HCC)   HTN (hypertension)   Mitral valve regurgitation    Discharge Instructions  Discharge Instructions     Call MD for:  difficulty breathing, headache or visual disturbances   Complete by: As directed    Diet - low sodium heart healthy   Complete by: As directed    Discharge instructions   Complete by: As directed    Resume your home dialysis schedule as previously done   Increase activity slowly   Complete by: As directed       Allergies as of 10/23/2021   No Known Allergies      Medication List     STOP taking these medications    nebivolol 10 MG tablet Commonly known as: BYSTOLIC  TAKE these medications    allopurinol 100 MG tablet Commonly known as: ZYLOPRIM Take 100 mg by mouth every morning.   apixaban 2.5 MG Tabs tablet Commonly known as: ELIQUIS Take 1 tablet (2.5 mg total) by mouth 2 (two) times daily.   calcitRIOL 0.25 MCG capsule Commonly known as: ROCALTROL Take 0.5 mcg by mouth every morning.   FIBERCON PO Take 2 tablets by mouth every morning.   furosemide 40 MG tablet Commonly known as: LASIX Take 40 mg by mouth every morning.   gabapentin 300 MG capsule Commonly known as: NEURONTIN Take 300 mg by mouth  at bedtime.   metoprolol tartrate 25 MG tablet Commonly known as: LOPRESSOR Take 1 tablet (25 mg total) by mouth 2 (two) times daily.   tamsulosin 0.4 MG Caps capsule Commonly known as: FLOMAX TAKE 1 CAPSULE BY MOUTH EVERY DAY What changed:  how to take this when to take this   testosterone cypionate 200 MG/ML injection Commonly known as: DEPOTESTOSTERONE CYPIONATE INJECT 1 ML (200 MG TOTAL) INTO THE MUSCLE EVERY 14 (FOURTEEN) DAYS. MUST SCHEDULE ANNUAL EXAM What changed: when to take this   Velphoro 500 MG chewable tablet Generic drug: sucroferric oxyhydroxide Chew 1,000 mg by mouth See admin instructions. Chew 2 tablets (1000 mg) by mouth once or twice daily with meals        Follow-up Information     [facility] Follow up.   Specialty: Cardiology Why: [place] office will mail you a 30 day monitor to wear after discharge. It will come with instructions for how to apply but please call the monitoring company or this phone number if you have any further questions. Contact information: [address], [address] [place] [facility] up.   Specialty: Cardiology Why: We have arranged follow-up in the [facility] on Thursday Dec 14, 2021 at 10:00 AM. Please arrive 10-15 minutes early to check in. Instructions for how to get there, including parking garage code, are included further down in discharge instructions section. Contact information: [address] [id number] [place] [phone]               No Known Allergies  Consultations: Cardiology, nephrology   Procedures/Studies: DG Chest 2 View  Result Date: 10/22/2021 CLINICAL DATA:  Chest pain EXAM: CHEST - 2 VIEW COMPARISON:  None. FINDINGS: The heart size and mediastinal contours are within normal limits. Both lungs are clear. The  visualized skeletal structures are unremarkable. IMPRESSION: No active cardiopulmonary disease. Electronically Signed   By: Keane Police D.O.   On: 10/22/2021 13:47   US Venous Img Lower Bilateral  Result Date: 10/22/2021 CLINICAL DATA:  Positive D-dimer. EXAM: BILATERAL LOWER EXTREMITY VENOUS DOPPLER ULTRASOUND TECHNIQUE: Gray-scale sonography with compression, as well as color and duplex ultrasound, were performed to evaluate the deep venous system(s) from the level of the common femoral vein through the popliteal and proximal calf veins. COMPARISON:  None. FINDINGS: VENOUS Normal compressibility of the common femoral, superficial femoral, and popliteal veins, as well as the visualized calf veins. Visualized portions of profunda femoral vein and great saphenous vein unremarkable. No filling defects to suggest DVT on grayscale or color Doppler imaging. Doppler waveforms show normal direction of venous flow, normal respiratory plasticity and response to augmentation. Limited views of the contralateral common femoral vein are unremarkable. OTHER None. Limitations: none IMPRESSION: Negative.  Electronically Signed   By: Dorise Bullion III M.D.   On: 10/22/2021 17:44   ECHOCARDIOGRAM COMPLETE  Result Date: 10/23/2021    ECHOCARDIOGRAM REPORT   Patient Name:   Eric Douglas Date of Exam: 10/23/2021 Medical Rec #:  [id number]            Height:       77.0 in Accession #:    [id number]           Weight:       328.4 lb Date of Birth:  Mar 18, 1971            BSA:          2.762 m Patient Age:    51 years             BP:           153/95 mmHg Patient Gender: M                    HR:           73 bpm. Exam Location:  Inpatient Procedure: 2D Echo, Cardiac Doppler and Color Doppler Indications:    afib/mv disorder  History:        Patient has no prior history of Echocardiogram examinations.                 Risk Factors:Hypertension.  Sonographer:    Beryle Beams Referring Phys: Harold  1.  Left ventricular ejection fraction, by estimation, is 55 to 60%. The left ventricle has normal function. The left ventricle has no regional wall motion abnormalities. The left ventricular internal cavity size was mildly dilated. There is mild left ventricular hypertrophy. Left ventricular diastolic parameters were normal.  2. Right ventricular systolic function is normal. The right ventricular size is mildly enlarged. There is moderately elevated pulmonary artery systolic pressure. The estimated right ventricular systolic pressure is 96.7 mmHg.  3. Left atrial size was mildly dilated.  4. The aortic valve is tricuspid. Aortic valve regurgitation is not visualized. Aortic valve sclerosis/calcification is present, without any evidence of aortic stenosis.  5. The inferior vena cava is dilated in size with <50% respiratory variability, suggesting right atrial pressure of 15 mmHg.  6. The mitral valve is abnormal. Moderate leaflet thickening. Moderate mitral valve regurgitation. MR jet is not well visualized on long axis images, but appears more significant on MV short axis image (image 17). Does have LA/LV dilatation. Consider TEE vs cardiac MRI for further evaluation FINDINGS  Left Ventricle: Left ventricular ejection fraction, by estimation, is 55 to 60%. The left ventricle has normal function. The left ventricle has no regional wall motion abnormalities. The left ventricular internal cavity size was mildly dilated. There is  mild left ventricular hypertrophy. Left ventricular diastolic parameters were normal. Right Ventricle: The right ventricular size is mildly enlarged. No increase in right ventricular wall thickness. Right ventricular systolic function is normal. There is moderately elevated pulmonary artery systolic pressure. The tricuspid regurgitant velocity is 2.77 m/s, and with an assumed right atrial pressure of 15 mmHg, the estimated right ventricular systolic pressure is 89.3 mmHg. Left Atrium: Left atrial  size was mildly dilated. Right Atrium: Right atrial size was normal in size. Pericardium: There is no evidence of pericardial effusion. Mitral Valve: The mitral valve is abnormal. There is moderate thickening of the mitral valve leaflet(s). Moderate mitral valve regurgitation. Tricuspid Valve: The tricuspid valve is normal in structure. Tricuspid valve regurgitation is  mild. Aortic Valve: The aortic valve is tricuspid. Aortic valve regurgitation is not visualized. Aortic valve sclerosis/calcification is present, without any evidence of aortic stenosis. Aortic valve mean gradient measures 8.0 mmHg. Aortic valve peak gradient measures 12.4 mmHg. Aortic valve area, by VTI measures 2.37 cm. Pulmonic Valve: The pulmonic valve was not well visualized. Pulmonic valve regurgitation is not visualized. Aorta: The aortic root and ascending aorta are structurally normal, with no evidence of dilitation. Venous: The inferior vena cava is dilated in size with less than 50% respiratory variability, suggesting right atrial pressure of 15 mmHg. IAS/Shunts: The interatrial septum was not well visualized.  LEFT VENTRICLE PLAX 2D LVIDd:         5.90 cm      Diastology LVIDs:         4.20 cm      LV e' medial:    8.27 cm/s LV PW:         1.40 cm      LV E/e' medial:  13.3 LV IVS:        1.10 cm      LV e' lateral:   11.90 cm/s LVOT diam:     2.20 cm      LV E/e' lateral: 9.2 LV SV:         97 LV SV Index:   35 LVOT Area:     3.80 cm  LV Volumes (MOD) LV vol d, MOD A2C: 268.0 ml LV vol d, MOD A4C: 304.0 ml LV vol s, MOD A2C: 84.7 ml LV vol s, MOD A4C: 126.0 ml LV SV MOD A2C:     183.3 ml LV SV MOD A4C:     304.0 ml LV SV MOD BP:      181.4 ml RIGHT VENTRICLE             IVC RV S prime:     15.70 cm/s  IVC diam: 3.00 cm TAPSE (M-mode): 3.2 cm LEFT ATRIUM              Index        RIGHT ATRIUM           Index LA diam:        5.40 cm  1.96 cm/m   RA Area:     22.80 cm LA Vol (A2C):   80.4 ml  29.11 ml/m  RA Volume:   77.00 ml  27.88  ml/m LA Vol (A4C):   102.0 ml 36.93 ml/m LA Biplane Vol: 95.5 ml  34.58 ml/m  AORTIC VALVE                     PULMONIC VALVE AV Area (Vmax):    2.29 cm      PV Vmax:       0.66 m/s AV Area (Vmean):   2.06 cm      PV Peak grad:  1.7 mmHg AV Area (VTI):     2.37 cm AV Vmax:           176.00 cm/s AV Vmean:          131.000 cm/s AV VTI:            0.407 m AV Peak Grad:      12.4 mmHg AV Mean Grad:      8.0 mmHg LVOT Vmax:         106.00 cm/s LVOT Vmean:        70.900 cm/s LVOT VTI:  0.254 m LVOT/AV VTI ratio: 0.62  AORTA Ao Root diam: 3.30 cm Ao Asc diam:  3.10 cm MITRAL VALVE                TRICUSPID VALVE MV Area (PHT): 4.12 cm     TR Peak grad:   30.7 mmHg MV Decel Time: 184 msec     TR Mean grad:   23.0 mmHg MV E velocity: 110.00 cm/s  TR Vmax:        277.00 cm/s MV A velocity: 74.60 cm/s   TR Vmean:       231.0 cm/s MV E/A ratio:  1.47                             SHUNTS                             Systemic VTI:  0.25 m                             Systemic Diam: 2.20 cm Oswaldo Milian MD Electronically signed by Oswaldo Milian MD Signature Date/Time: 10/23/2021/1:52:41 PM    Final       Subjective: Feels better.  No chest pain or shortness of breath  Discharge Exam: Vitals:   10/23/21 1231 10/23/21 1240  BP: (!) 171/97   Pulse: 74 75  Resp:    Temp:  97.8 F (36.6 C)  SpO2:  95%   Vitals:   10/23/21 0524 10/23/21 0808 10/23/21 1231 10/23/21 1240  BP: (!) 153/95 (!) 166/98 (!) 171/97   Pulse: 68 73 74 75  Resp:  18    Temp:  98 F (36.7 C)  97.8 F (36.6 C)  TempSrc:  Oral  Oral  SpO2:  95%  95%  Weight:      Height:        General: Pt is alert, awake, not in acute distress Cardiovascular: RRR, S1/S2 +, no rubs, no gallops Respiratory: CTA bilaterally, no wheezing, no rhonchi Abdominal: Soft, NT, ND, bowel sounds + Extremities: no edema, no cyanosis    The results of significant diagnostics from this hospitalization (including imaging, microbiology,  ancillary and laboratory) are listed below for reference.     Microbiology: Recent Results (from the past 240 hour(s))  Resp Panel by RT-PCR (Flu A&B, Covid) Nasopharyngeal Swab     Status: None   Collection Time: 10/22/21  3:55 PM   Specimen: Nasopharyngeal Swab; Nasopharyngeal(NP) swabs in vial transport medium  Result Value Ref Range Status   SARS Coronavirus 2 by RT PCR NEGATIVE NEGATIVE Final    Comment: (NOTE) SARS-CoV-2 target nucleic acids are NOT DETECTED.  The SARS-CoV-2 RNA is generally detectable in upper respiratory specimens during the acute phase of infection. The lowest concentration of SARS-CoV-2 viral copies this assay can detect is 138 copies/mL. A negative result does not preclude SARS-Cov-2 infection and should not be used as the sole basis for treatment or other patient management decisions. A negative result may occur with  improper specimen collection/handling, submission of specimen other than nasopharyngeal swab, presence of viral mutation(s) within the areas targeted by this assay, and inadequate number of viral copies(<138 copies/mL). A negative result must be combined with clinical observations, patient history, and epidemiological information. The expected result is Negative.  Fact Sheet for Patients:  EntrepreneurPulse.com.au  Fact Sheet for Healthcare  Providers:  IncredibleEmployment.be  This test is no t yet approved or cleared by the [place] and  has been authorized for detection and/or diagnosis of SARS-CoV-2 by FDA under an Emergency Use Authorization (EUA). This EUA will remain  in effect (meaning this test can be used) for the duration of the COVID-19 declaration under Section 564(b)(1) of the Act, 21 U.S.C.section 360bbb-3(b)(1), unless the authorization is terminated  or revoked sooner.       Influenza A by PCR NEGATIVE NEGATIVE Final   Influenza B by PCR NEGATIVE NEGATIVE Final     Comment: (NOTE) The Xpert Xpress SARS-CoV-2/FLU/RSV plus assay is intended as an aid in the diagnosis of influenza from Nasopharyngeal swab specimens and should not be used as a sole basis for treatment. Nasal washings and aspirates are unacceptable for Xpert Xpress SARS-CoV-2/FLU/RSV testing.  Fact Sheet for Patients: EntrepreneurPulse.com.au  Fact Sheet for Healthcare Providers: IncredibleEmployment.be  This test is not yet approved or cleared by the [place] FDA and has been authorized for detection and/or diagnosis of SARS-CoV-2 by FDA under an Emergency Use Authorization (EUA). This EUA will remain in effect (meaning this test can be used) for the duration of the COVID-19 declaration under Section 564(b)(1) of the Act, 21 U.S.C. section 360bbb-3(b)(1), unless the authorization is terminated or revoked.  Performed at [facility], [address] [place], [place] [address]      Labs: BNP (last 3 results) No results for input(s): BNP in the last 8760 hours. Basic Metabolic Panel: Recent Labs  Lab 10/22/21 1430 10/23/21 0129  NA 135 136  K 3.9 4.0  CL 98 102  CO2 25 25  GLUCOSE 77 86  BUN 37* 39*  CREATININE 9.54* 10.89*  CALCIUM 9.8 8.8*   Liver Function Tests: No results for input(s): AST, ALT, ALKPHOS, BILITOT, PROT, ALBUMIN in the last 168 hours. No results for input(s): LIPASE, AMYLASE in the last 168 hours. No results for input(s): AMMONIA in the last 168 hours. CBC: Recent Labs  Lab 10/22/21 1430 10/23/21 0129  WBC 5.4 5.4  HGB 9.3* 8.4*  HCT 27.4* 25.2*  MCV 96.8 96.2  PLT 131* 115*   Cardiac Enzymes: No results for input(s): CKTOTAL, CKMB, CKMBINDEX, TROPONINI in the last 168 hours. BNP: Invalid input(s): POCBNP CBG: No results for input(s): GLUCAP in the last 168 hours. D-Dimer Recent Labs    10/22/21 1537  DDIMER 0.84*   Hgb A1c No results for input(s): HGBA1C in the last 72  hours. Lipid Profile No results for input(s): CHOL, HDL, LDLCALC, TRIG, CHOLHDL, LDLDIRECT in the last 72 hours. Thyroid function studies Recent Labs    10/22/21 1633  TSH 2.628   Anemia work up No results for input(s): VITAMINB12, FOLATE, FERRITIN, TIBC, IRON, RETICCTPCT in the last 72 hours. Urinalysis    Component Value Date/Time   COLORURINE YELLOW 06/14/2019 [place] 06/14/2019 0951   LABSPEC 1.025 06/14/2019 0951   PHURINE 5.5 06/14/2019 0951   GLUCOSEU NEGATIVE 06/14/2019 0951   HGBUR MODERATE (A) 06/14/2019 0951   BILIRUBINUR NEGATIVE 06/14/2019 0951   BILIRUBINUR neg 01/13/2018 1108   [place] 06/14/2019 0951   PROTEINUR 100 (A) 06/14/2019 0951   UROBILINOGEN 0.2 01/13/2018 1108   NITRITE NEGATIVE 06/14/2019 0951   LEUKOCYTESUR NEGATIVE 06/14/2019 0951   Sepsis Labs Invalid input(s): PROCALCITONIN,  WBC,  LACTICIDVEN Microbiology Recent Results (from the past 240 hour(s))  Resp Panel by RT-PCR (Flu A&B, Covid) Nasopharyngeal Swab  Status: None   Collection Time: 10/22/21  3:55 PM   Specimen: Nasopharyngeal Swab; Nasopharyngeal(NP) swabs in vial transport medium  Result Value Ref Range Status   SARS Coronavirus 2 by RT PCR NEGATIVE NEGATIVE Final    Comment: (NOTE) SARS-CoV-2 target nucleic acids are NOT DETECTED.  The SARS-CoV-2 RNA is generally detectable in upper respiratory specimens during the acute phase of infection. The lowest concentration of SARS-CoV-2 viral copies this assay can detect is 138 copies/mL. A negative result does not preclude SARS-Cov-2 infection and should not be used as the sole basis for treatment or other patient management decisions. A negative result may occur with  improper specimen collection/handling, submission of specimen other than nasopharyngeal swab, presence of viral mutation(s) within the areas targeted by this assay, and inadequate number of viral copies(<138 copies/mL). A negative result  must be combined with clinical observations, patient history, and epidemiological information. The expected result is Negative.  Fact Sheet for Patients:  EntrepreneurPulse.com.au  Fact Sheet for Healthcare Providers:  IncredibleEmployment.be  This test is no t yet approved or cleared by the [place] FDA and  has been authorized for detection and/or diagnosis of SARS-CoV-2 by FDA under an Emergency Use Authorization (EUA). This EUA will remain  in effect (meaning this test can be used) for the duration of the COVID-19 declaration under Section 564(b)(1) of the Act, 21 U.S.C.section 360bbb-3(b)(1), unless the authorization is terminated  or revoked sooner.       Influenza A by PCR NEGATIVE NEGATIVE Final   Influenza B by PCR NEGATIVE NEGATIVE Final    Comment: (NOTE) The Xpert Xpress SARS-CoV-2/FLU/RSV plus assay is intended as an aid in the diagnosis of influenza from Nasopharyngeal swab specimens and should not be used as a sole basis for treatment. Nasal washings and aspirates are unacceptable for Xpert Xpress SARS-CoV-2/FLU/RSV testing.  Fact Sheet for Patients: EntrepreneurPulse.com.au  Fact Sheet for Healthcare Providers: IncredibleEmployment.be  This test is not yet approved or cleared by the [place] FDA and has been authorized for detection and/or diagnosis of SARS-CoV-2 by FDA under an Emergency Use Authorization (EUA). This EUA will remain in effect (meaning this test can be used) for the duration of the COVID-19 declaration under Section 564(b)(1) of the Act, 21 U.S.C. section 360bbb-3(b)(1), unless the authorization is terminated or revoked.  Performed at [facility], [address] [place], [place] [address]      Time coordinating discharge: Over 30 minutes  SIGNED:   Nolberto Hanlon, MD  [facility] 10/23/2021, 4:21 PM Pager   If 7PM-7AM, please  contact night-coverage [email or web address] Password [email or web address]

## 2021-10-23 NOTE — Progress Notes (Signed)
Asked to see pt for ESRD.  He is here for chest pain and was found to have atrial fibrillation.  He is a home HD patient, does HD 4 times per week, usually Sun-Tues-wed-Thursday. His usual UF is 3- 4 L.  No recent HD or access issues.  On exam no LE edema, clear lungs, no ^wob.  Pt okay for dc home, he will continue his regimen of home HD at home.  Regarding choice of anticoagulant, would recommend DOAC/ eliquis over coumadin as it simplifies significantly the anticoagulation process w/o excessive side effects. Have d/w pmd.   Kelly Splinter, MD 10/23/2021, 3:46 PM

## 2021-10-23 NOTE — Progress Notes (Signed)
ANTICOAGULATION CONSULT NOTE  Pharmacy Consult for heparin> apixaban Indication: afib  No Known Allergies  Patient Measurements: Height: 6\' 5"  (195.6 cm) Weight: (!) 149 kg (328 lb 6.4 oz) IBW/kg (Calculated) : 89.1 Heparin Dosing Weight: 120kg  Vital Signs: Temp: 97.8 F (36.6 C) (01/09 1240) Temp Source: Oral (01/09 1240) BP: 171/97 (01/09 1231) Pulse Rate: 75 (01/09 1240)  Labs: Recent Labs    10/22/21 1430 10/22/21 1633 10/23/21 0129 10/23/21 1054  HGB 9.3*  --  8.4*  --   HCT 27.4*  --  25.2*  --   PLT 131*  --  115*  --   HEPARINUNFRC  --   --  <0.10* 0.14*  CREATININE 9.54*  --  10.89*  --   TROPONINIHS 30* 29*  --   --      Estimated Creatinine Clearance: 12.8 mL/min (A) (by C-G formula based on SCr of 10.89 mg/dL (H)).   Medical History: Past Medical History:  Diagnosis Date   Fibula fracture    ORIF 1990   Gout    non crystal proven    History of chickenpox    History of kidney disease    HTN (hypertension)    Nephropathy    Ig A; dx 2002. micropscopic hematuria and proteinuria   Tibia fracture    ORIF 1990   Varicocele    L testes   Venous incompetence    R leg   Assessment: 55 YOM presenting with CP, in afib, also elevated D dimer and chronic anemia stable.  He is not on anticoagulation PTA - venous doppler negative   Plans to change to apixaban  Goal of Therapy:  Heparin level 0.3-0.7 units/ml Monitor platelets by anticoagulation protocol: Yes   Plan:  -apixaban 5mg  po bid -discontinue heparin   Hildred Laser, PharmD Clinical Pharmacist **Pharmacist phone directory can now be found on [email or web address] (PW [email or web address]).  Listed under [place].
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
## 2021-10-23 NOTE — TOC Benefit Eligibility Note (Signed)
Patient Teacher, English as a foreign language completed.    The patient is currently admitted and upon discharge could be taking Eliquis 5 mg.  The current 30 day co-pay is, $0.00.   The patient is insured through [place], [place] Patient Advocate Specialist [place] Patient Advocate Team Direct Number: [phone]  Fax: [phone]

## 2021-10-23 NOTE — Discharge Instructions (Addendum)
Atrial Fibrillation Clinic Follow-Up  You have an appointment set up with the [facility].  Multiple studies have shown that being followed by a dedicated atrial fibrillation clinic in addition to the standard care you receive from your other physicians improves health. We believe that enrollment in the atrial fibrillation clinic will allow [place] to better care for you.   The phone number to the [facility] is [phone]. The clinic is staffed Monday through Friday from 8:30am to 5pm.  Parking Directions: The clinic is located in the Heart and Vascular Building connected to [facility] 1)From [address] turn on to [address] and go to the 3rd entrance  (Heart and Vascular entrance) on the right. 2)Look to the right for Heart &Vascular Parking Garage. 3)A parking garage code for the entrance is required: 1102 4)Take the elevators to the 1st floor. Registration is in the room with the glass walls at the end of the hallway.  If you have any trouble parking or locating the clinic, please dont hesitate to call [phone].

## 2021-10-23 NOTE — Progress Notes (Signed)
ANTICOAGULATION CONSULT NOTE - Follow Up Consult  Pharmacy Consult for heparin Indication: atrial fibrillation  Labs: Recent Labs    10/22/21 1430 10/22/21 1633 10/23/21 0129  HGB 9.3*  --  8.4*  HCT 27.4*  --  25.2*  PLT 131*  --  115*  HEPARINUNFRC  --   --  <0.10*  CREATININE 9.54*  --  10.89*  TROPONINIHS 30* 29*  --     Assessment: 50yo male subtherapeutic on heparin with initial dosing for new Afib; no infusion issues or signs of bleeding per RN though Hgb has dropped a point, now at 8.4.  Goal of Therapy:  Heparin level 0.3-0.7 units/ml   Plan:  Will defer bolus and increase heparin infusion by 4 units/kgABW/hr to 2200 units/hr and check level in 8 hours.    Wynona Neat, PharmD, BCPS  10/23/2021,3:09 AM

## 2021-10-23 NOTE — Plan of Care (Signed)
°  Problem: Cardiac: Goal: Ability to achieve and maintain adequate cardiopulmonary perfusion will improve Outcome: Adequate for Discharge   Problem: Education: Goal: Knowledge of General Education information will improve Description: Including pain rating scale, medication(s)/side effects and non-pharmacologic comfort measures Outcome: Adequate for Discharge   Problem: Health Behavior/Discharge Planning: Goal: Ability to manage health-related needs will improve Outcome: Adequate for Discharge   Problem: Clinical Measurements: Goal: Ability to maintain clinical measurements within normal limits will improve Outcome: Adequate for Discharge Goal: Will remain free from infection Outcome: Adequate for Discharge Goal: Diagnostic test results will improve Outcome: Adequate for Discharge Goal: Respiratory complications will improve Outcome: Adequate for Discharge Goal: Cardiovascular complication will be avoided Outcome: Adequate for Discharge   Problem: Activity: Goal: Risk for activity intolerance will decrease Outcome: Adequate for Discharge   Problem: Nutrition: Goal: Adequate nutrition will be maintained Outcome: Adequate for Discharge   Problem: Coping: Goal: Level of anxiety will decrease Outcome: Adequate for Discharge   Problem: Elimination: Goal: Will not experience complications related to bowel motility Outcome: Adequate for Discharge Goal: Will not experience complications related to urinary retention Outcome: Adequate for Discharge   Problem: Pain Managment: Goal: General experience of comfort will improve Outcome: Adequate for Discharge   Problem: Safety: Goal: Ability to remain free from injury will improve Outcome: Adequate for Discharge   Problem: Skin Integrity: Goal: Risk for impaired skin integrity will decrease Outcome: Adequate for Discharge

## 2021-10-23 NOTE — Progress Notes (Signed)
° °  Echocardiogram 2D Echocardiogram has been performed.  Eric Douglas 10/23/2021, 10:07 AM

## 2021-10-23 NOTE — Progress Notes (Signed)
Patient given discharge instructions and stated understanding. 

## 2021-10-23 NOTE — Progress Notes (Signed)
30 day cardiac event monitor per Dr. Johnsie Cancel

## 2021-10-23 NOTE — Progress Notes (Signed)
[facility] for heparin Indication: afib, VTE rule out  No Known Allergies  Patient Measurements: Height: 6\' 5"  (195.6 cm) Weight: (!) 149 kg (328 lb 6.4 oz) IBW/kg (Calculated) : 89.1 Heparin Dosing Weight: 120kg  Vital Signs: Temp: 97.8 F (36.6 C) (01/09 1240) Temp Source: Oral (01/09 1240) BP: 171/97 (01/09 1231) Pulse Rate: 75 (01/09 1240)  Labs: Recent Labs    10/22/21 1430 10/22/21 1633 10/23/21 0129 10/23/21 1054  HGB 9.3*  --  8.4*  --   HCT 27.4*  --  25.2*  --   PLT 131*  --  115*  --   HEPARINUNFRC  --   --  <0.10* 0.14*  CREATININE 9.54*  --  10.89*  --   TROPONINIHS 30* 29*  --   --      Estimated Creatinine Clearance: 12.8 mL/min (A) (by C-G formula based on SCr of 10.89 mg/dL (H)).   Medical History: Past Medical History:  Diagnosis Date   Fibula fracture    ORIF 1990   Gout    non crystal proven    History of chickenpox    History of kidney disease    HTN (hypertension)    Nephropathy    Ig A; dx 2002. micropscopic hematuria and proteinuria   Tibia fracture    ORIF 1990   Varicocele    L testes   Venous incompetence    R leg   Assessment: 37 YOM presenting with CP, in afib, also elevated D dimer and chronic anemia stable.  He is not on anticoagulation PTA - heparin level = 0.14 - HG = 8.4 and platelet =115 - venous doppler negative    Goal of Therapy:  Heparin level 0.3-0.7 units/ml Monitor platelets by anticoagulation protocol: Yes   Plan:  Heparin 3000 units IV x 1 and increase infusion to 2600 units/hr - heparin level checked in 8 hours  [place] PharmD Candidate 2025

## 2021-10-23 NOTE — Progress Notes (Signed)
Sent request to monitor team to arrange 30 day monitor (this will be mailed to pt's house). Will also plan to place f/u appt on AVS when obtained.

## 2021-10-24 DIAGNOSIS — Z992 Dependence on renal dialysis: Secondary | ICD-10-CM | POA: Diagnosis not present

## 2021-10-24 DIAGNOSIS — D631 Anemia in chronic kidney disease: Secondary | ICD-10-CM | POA: Diagnosis not present

## 2021-10-24 DIAGNOSIS — D509 Iron deficiency anemia, unspecified: Secondary | ICD-10-CM | POA: Diagnosis not present

## 2021-10-24 DIAGNOSIS — Z79899 Other long term (current) drug therapy: Secondary | ICD-10-CM | POA: Diagnosis not present

## 2021-10-24 DIAGNOSIS — N2581 Secondary hyperparathyroidism of renal origin: Secondary | ICD-10-CM | POA: Diagnosis not present

## 2021-10-24 DIAGNOSIS — N186 End stage renal disease: Secondary | ICD-10-CM | POA: Diagnosis not present

## 2021-10-25 DIAGNOSIS — N186 End stage renal disease: Secondary | ICD-10-CM | POA: Diagnosis not present

## 2021-10-25 DIAGNOSIS — Z79899 Other long term (current) drug therapy: Secondary | ICD-10-CM | POA: Diagnosis not present

## 2021-10-25 DIAGNOSIS — D509 Iron deficiency anemia, unspecified: Secondary | ICD-10-CM | POA: Diagnosis not present

## 2021-10-25 DIAGNOSIS — N2581 Secondary hyperparathyroidism of renal origin: Secondary | ICD-10-CM | POA: Diagnosis not present

## 2021-10-25 DIAGNOSIS — D631 Anemia in chronic kidney disease: Secondary | ICD-10-CM | POA: Diagnosis not present

## 2021-10-25 DIAGNOSIS — Z992 Dependence on renal dialysis: Secondary | ICD-10-CM | POA: Diagnosis not present

## 2021-10-26 ENCOUNTER — Ambulatory Visit (INDEPENDENT_AMBULATORY_CARE_PROVIDER_SITE_OTHER): Payer: Medicare Other

## 2021-10-26 ENCOUNTER — Encounter: Payer: Self-pay | Admitting: Cardiovascular Disease

## 2021-10-26 DIAGNOSIS — R002 Palpitations: Secondary | ICD-10-CM | POA: Diagnosis not present

## 2021-10-26 DIAGNOSIS — I480 Paroxysmal atrial fibrillation: Secondary | ICD-10-CM

## 2021-10-26 DIAGNOSIS — D509 Iron deficiency anemia, unspecified: Secondary | ICD-10-CM | POA: Diagnosis not present

## 2021-10-26 DIAGNOSIS — N186 End stage renal disease: Secondary | ICD-10-CM | POA: Diagnosis not present

## 2021-10-26 DIAGNOSIS — Z992 Dependence on renal dialysis: Secondary | ICD-10-CM | POA: Diagnosis not present

## 2021-10-26 DIAGNOSIS — D631 Anemia in chronic kidney disease: Secondary | ICD-10-CM | POA: Diagnosis not present

## 2021-10-26 DIAGNOSIS — N2581 Secondary hyperparathyroidism of renal origin: Secondary | ICD-10-CM | POA: Diagnosis not present

## 2021-10-26 DIAGNOSIS — Z79899 Other long term (current) drug therapy: Secondary | ICD-10-CM | POA: Diagnosis not present

## 2021-10-27 DIAGNOSIS — I480 Paroxysmal atrial fibrillation: Secondary | ICD-10-CM | POA: Diagnosis not present

## 2021-10-27 DIAGNOSIS — R002 Palpitations: Secondary | ICD-10-CM | POA: Diagnosis not present

## 2021-10-29 DIAGNOSIS — D631 Anemia in chronic kidney disease: Secondary | ICD-10-CM | POA: Diagnosis not present

## 2021-10-29 DIAGNOSIS — Z992 Dependence on renal dialysis: Secondary | ICD-10-CM | POA: Diagnosis not present

## 2021-10-29 DIAGNOSIS — Z79899 Other long term (current) drug therapy: Secondary | ICD-10-CM | POA: Diagnosis not present

## 2021-10-29 DIAGNOSIS — N2581 Secondary hyperparathyroidism of renal origin: Secondary | ICD-10-CM | POA: Diagnosis not present

## 2021-10-29 DIAGNOSIS — N186 End stage renal disease: Secondary | ICD-10-CM | POA: Diagnosis not present

## 2021-10-29 DIAGNOSIS — D509 Iron deficiency anemia, unspecified: Secondary | ICD-10-CM | POA: Diagnosis not present

## 2021-10-30 ENCOUNTER — Telehealth: Payer: Self-pay

## 2021-10-30 DIAGNOSIS — Z992 Dependence on renal dialysis: Secondary | ICD-10-CM | POA: Diagnosis not present

## 2021-10-30 DIAGNOSIS — Z79899 Other long term (current) drug therapy: Secondary | ICD-10-CM | POA: Diagnosis not present

## 2021-10-30 DIAGNOSIS — D631 Anemia in chronic kidney disease: Secondary | ICD-10-CM | POA: Diagnosis not present

## 2021-10-30 DIAGNOSIS — N2581 Secondary hyperparathyroidism of renal origin: Secondary | ICD-10-CM | POA: Diagnosis not present

## 2021-10-30 DIAGNOSIS — D509 Iron deficiency anemia, unspecified: Secondary | ICD-10-CM | POA: Diagnosis not present

## 2021-10-30 DIAGNOSIS — N186 End stage renal disease: Secondary | ICD-10-CM | POA: Diagnosis not present

## 2021-10-30 NOTE — Telephone Encounter (Signed)
° °  Cardiac Monitor Alert  Date of alert:  10/30/2021   Patient Name: Eric Douglas  DOB: [birth year]  MRN: [id number]   [facility] Cardiologist: None  [facility] EP:  None    Monitor Information: Cardiac Event Monitor [[employer]]  Reason:  new diagnosis PAF Ordering provider:  [name], PA per Dr. Johnsie Cancel   Alert Atrial Fibrillation RVR HR 160 bpm This is the 1st alert for this rhythm.  The patient has a hx of Atrial Fibrillation/Flutter.    Anticoagulation medication as of 10/30/2021           apixaban (ELIQUIS) 2.5 MG TABS tablet Take 1 tablet (2.5 mg total) by mouth 2 (two) times daily.      Pt has not missed any doses of metoprolol 25 mg PO BID and eliquis 2.5 mg PO BID.   Next Cardiology Appointment   Date:  12/14/2021 Provider: Adline Peals  The patient was contacted today.  He is asymptomatic.  Pt reports he felt heart speed up while watching tv.  He reports feeling this for about a month and was no different than previous feelings. The patient was referred to the Atrial Fibrillation Clinic.  Appt date/time:  12/14/2021 (7-8 week f/u per Dr. Johnsie Cancel)     Other  Precious Gilding, RN  10/30/2021 11:04 AM

## 2021-10-31 ENCOUNTER — Other Ambulatory Visit: Payer: Self-pay

## 2021-10-31 ENCOUNTER — Ambulatory Visit (INDEPENDENT_AMBULATORY_CARE_PROVIDER_SITE_OTHER): Payer: Medicare Other

## 2021-10-31 DIAGNOSIS — G4733 Obstructive sleep apnea (adult) (pediatric): Secondary | ICD-10-CM | POA: Diagnosis not present

## 2021-11-02 DIAGNOSIS — Z79899 Other long term (current) drug therapy: Secondary | ICD-10-CM | POA: Diagnosis not present

## 2021-11-02 DIAGNOSIS — N2581 Secondary hyperparathyroidism of renal origin: Secondary | ICD-10-CM | POA: Diagnosis not present

## 2021-11-02 DIAGNOSIS — D509 Iron deficiency anemia, unspecified: Secondary | ICD-10-CM | POA: Diagnosis not present

## 2021-11-02 DIAGNOSIS — D631 Anemia in chronic kidney disease: Secondary | ICD-10-CM | POA: Diagnosis not present

## 2021-11-02 DIAGNOSIS — N186 End stage renal disease: Secondary | ICD-10-CM | POA: Diagnosis not present

## 2021-11-02 DIAGNOSIS — Z992 Dependence on renal dialysis: Secondary | ICD-10-CM | POA: Diagnosis not present

## 2021-11-03 ENCOUNTER — Telehealth: Payer: Self-pay

## 2021-11-03 DIAGNOSIS — Z79899 Other long term (current) drug therapy: Secondary | ICD-10-CM | POA: Diagnosis not present

## 2021-11-03 DIAGNOSIS — N186 End stage renal disease: Secondary | ICD-10-CM | POA: Diagnosis not present

## 2021-11-03 DIAGNOSIS — Z992 Dependence on renal dialysis: Secondary | ICD-10-CM | POA: Diagnosis not present

## 2021-11-03 DIAGNOSIS — D631 Anemia in chronic kidney disease: Secondary | ICD-10-CM | POA: Diagnosis not present

## 2021-11-03 DIAGNOSIS — N2581 Secondary hyperparathyroidism of renal origin: Secondary | ICD-10-CM | POA: Diagnosis not present

## 2021-11-03 DIAGNOSIS — D509 Iron deficiency anemia, unspecified: Secondary | ICD-10-CM | POA: Diagnosis not present

## 2021-11-03 MED ORDER — METOPROLOL TARTRATE 50 MG PO TABS: 50.0000 mg | ORAL_TABLET | Freq: Two times a day (BID) | ORAL | 3 refills | Status: DC
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
## 2021-11-05 DIAGNOSIS — Z79899 Other long term (current) drug therapy: Secondary | ICD-10-CM | POA: Diagnosis not present

## 2021-11-05 DIAGNOSIS — N2581 Secondary hyperparathyroidism of renal origin: Secondary | ICD-10-CM | POA: Diagnosis not present

## 2021-11-05 DIAGNOSIS — D509 Iron deficiency anemia, unspecified: Secondary | ICD-10-CM | POA: Diagnosis not present

## 2021-11-05 DIAGNOSIS — Z992 Dependence on renal dialysis: Secondary | ICD-10-CM | POA: Diagnosis not present

## 2021-11-05 DIAGNOSIS — D631 Anemia in chronic kidney disease: Secondary | ICD-10-CM | POA: Diagnosis not present

## 2021-11-05 DIAGNOSIS — N186 End stage renal disease: Secondary | ICD-10-CM | POA: Diagnosis not present

## 2021-11-06 ENCOUNTER — Telehealth: Payer: Self-pay | Admitting: Pulmonary Disease

## 2021-11-06 DIAGNOSIS — Z79899 Other long term (current) drug therapy: Secondary | ICD-10-CM | POA: Diagnosis not present

## 2021-11-06 DIAGNOSIS — Z992 Dependence on renal dialysis: Secondary | ICD-10-CM | POA: Diagnosis not present

## 2021-11-06 DIAGNOSIS — G4733 Obstructive sleep apnea (adult) (pediatric): Secondary | ICD-10-CM

## 2021-11-06 DIAGNOSIS — N2581 Secondary hyperparathyroidism of renal origin: Secondary | ICD-10-CM | POA: Diagnosis not present

## 2021-11-06 DIAGNOSIS — D631 Anemia in chronic kidney disease: Secondary | ICD-10-CM | POA: Diagnosis not present

## 2021-11-06 DIAGNOSIS — N186 End stage renal disease: Secondary | ICD-10-CM | POA: Diagnosis not present

## 2021-11-06 DIAGNOSIS — D509 Iron deficiency anemia, unspecified: Secondary | ICD-10-CM | POA: Diagnosis not present

## 2021-11-06 NOTE — Telephone Encounter (Signed)
Call patient  Sleep study result  Date of study: 10/31/2021  Impression: Severe obstructive sleep apnea Severe oxygen desaturations  Recommendation: Recommend in lab titration for severe obstructive sleep apnea  In lab titration recommended because of the severe oxygen desaturations that may require oxygen supplementation  Encouraged weight loss measures  Follow-up as previously scheduled

## 2021-11-08 DIAGNOSIS — D631 Anemia in chronic kidney disease: Secondary | ICD-10-CM | POA: Diagnosis not present

## 2021-11-08 DIAGNOSIS — Z79899 Other long term (current) drug therapy: Secondary | ICD-10-CM | POA: Diagnosis not present

## 2021-11-08 DIAGNOSIS — Z992 Dependence on renal dialysis: Secondary | ICD-10-CM | POA: Diagnosis not present

## 2021-11-08 DIAGNOSIS — R82998 Other abnormal findings in urine: Secondary | ICD-10-CM | POA: Diagnosis not present

## 2021-11-08 DIAGNOSIS — N2581 Secondary hyperparathyroidism of renal origin: Secondary | ICD-10-CM | POA: Diagnosis not present

## 2021-11-08 DIAGNOSIS — D509 Iron deficiency anemia, unspecified: Secondary | ICD-10-CM | POA: Diagnosis not present

## 2021-11-08 DIAGNOSIS — N186 End stage renal disease: Secondary | ICD-10-CM | POA: Diagnosis not present

## 2021-11-08 NOTE — Telephone Encounter (Signed)
Just a titration study, not split.

## 2021-11-08 NOTE — Telephone Encounter (Signed)
I called the patient and he is agreeable to the other study. He did not have any questions.     Would you like a polysomnogram or splint night study? Please advise.

## 2021-11-09 DIAGNOSIS — Z992 Dependence on renal dialysis: Secondary | ICD-10-CM | POA: Diagnosis not present

## 2021-11-09 DIAGNOSIS — N186 End stage renal disease: Secondary | ICD-10-CM | POA: Diagnosis not present

## 2021-11-09 DIAGNOSIS — N2581 Secondary hyperparathyroidism of renal origin: Secondary | ICD-10-CM | POA: Diagnosis not present

## 2021-11-09 DIAGNOSIS — Z79899 Other long term (current) drug therapy: Secondary | ICD-10-CM | POA: Diagnosis not present

## 2021-11-09 DIAGNOSIS — D509 Iron deficiency anemia, unspecified: Secondary | ICD-10-CM | POA: Diagnosis not present

## 2021-11-09 DIAGNOSIS — D631 Anemia in chronic kidney disease: Secondary | ICD-10-CM | POA: Diagnosis not present

## 2021-11-10 DIAGNOSIS — Z992 Dependence on renal dialysis: Secondary | ICD-10-CM | POA: Diagnosis not present

## 2021-11-10 DIAGNOSIS — D631 Anemia in chronic kidney disease: Secondary | ICD-10-CM | POA: Diagnosis not present

## 2021-11-10 DIAGNOSIS — D509 Iron deficiency anemia, unspecified: Secondary | ICD-10-CM | POA: Diagnosis not present

## 2021-11-10 DIAGNOSIS — Z79899 Other long term (current) drug therapy: Secondary | ICD-10-CM | POA: Diagnosis not present

## 2021-11-10 DIAGNOSIS — N186 End stage renal disease: Secondary | ICD-10-CM | POA: Diagnosis not present

## 2021-11-10 DIAGNOSIS — N2581 Secondary hyperparathyroidism of renal origin: Secondary | ICD-10-CM | POA: Diagnosis not present

## 2021-11-10 NOTE — Addendum Note (Signed)
Addended by: Dessie Coma on: 11/10/2021 09:42 AM   Modules accepted: Orders

## 2021-11-10 NOTE — Telephone Encounter (Signed)
I have placed the order for the CPAP titration. Nothing further needed.

## 2021-11-12 DIAGNOSIS — N186 End stage renal disease: Secondary | ICD-10-CM | POA: Diagnosis not present

## 2021-11-12 DIAGNOSIS — Z79899 Other long term (current) drug therapy: Secondary | ICD-10-CM | POA: Diagnosis not present

## 2021-11-12 DIAGNOSIS — D631 Anemia in chronic kidney disease: Secondary | ICD-10-CM | POA: Diagnosis not present

## 2021-11-12 DIAGNOSIS — D509 Iron deficiency anemia, unspecified: Secondary | ICD-10-CM | POA: Diagnosis not present

## 2021-11-12 DIAGNOSIS — N2581 Secondary hyperparathyroidism of renal origin: Secondary | ICD-10-CM | POA: Diagnosis not present

## 2021-11-12 DIAGNOSIS — Z992 Dependence on renal dialysis: Secondary | ICD-10-CM | POA: Diagnosis not present

## 2021-11-13 DIAGNOSIS — Z79899 Other long term (current) drug therapy: Secondary | ICD-10-CM | POA: Diagnosis not present

## 2021-11-13 DIAGNOSIS — D509 Iron deficiency anemia, unspecified: Secondary | ICD-10-CM | POA: Diagnosis not present

## 2021-11-13 DIAGNOSIS — N2581 Secondary hyperparathyroidism of renal origin: Secondary | ICD-10-CM | POA: Diagnosis not present

## 2021-11-13 DIAGNOSIS — N186 End stage renal disease: Secondary | ICD-10-CM | POA: Diagnosis not present

## 2021-11-13 DIAGNOSIS — D631 Anemia in chronic kidney disease: Secondary | ICD-10-CM | POA: Diagnosis not present

## 2021-11-13 DIAGNOSIS — Z992 Dependence on renal dialysis: Secondary | ICD-10-CM | POA: Diagnosis not present

## 2021-11-15 DIAGNOSIS — I120 Hypertensive chronic kidney disease with stage 5 chronic kidney disease or end stage renal disease: Secondary | ICD-10-CM | POA: Diagnosis not present

## 2021-11-15 DIAGNOSIS — D509 Iron deficiency anemia, unspecified: Secondary | ICD-10-CM | POA: Diagnosis not present

## 2021-11-15 DIAGNOSIS — N2581 Secondary hyperparathyroidism of renal origin: Secondary | ICD-10-CM | POA: Diagnosis not present

## 2021-11-15 DIAGNOSIS — Z992 Dependence on renal dialysis: Secondary | ICD-10-CM | POA: Diagnosis not present

## 2021-11-15 DIAGNOSIS — N186 End stage renal disease: Secondary | ICD-10-CM | POA: Diagnosis not present

## 2021-11-15 DIAGNOSIS — E440 Moderate protein-calorie malnutrition: Secondary | ICD-10-CM | POA: Diagnosis not present

## 2021-11-15 DIAGNOSIS — D631 Anemia in chronic kidney disease: Secondary | ICD-10-CM | POA: Diagnosis not present

## 2021-11-15 DIAGNOSIS — Z79899 Other long term (current) drug therapy: Secondary | ICD-10-CM | POA: Diagnosis not present

## 2021-11-16 DIAGNOSIS — N2581 Secondary hyperparathyroidism of renal origin: Secondary | ICD-10-CM | POA: Diagnosis not present

## 2021-11-16 DIAGNOSIS — D631 Anemia in chronic kidney disease: Secondary | ICD-10-CM | POA: Diagnosis not present

## 2021-11-16 DIAGNOSIS — E440 Moderate protein-calorie malnutrition: Secondary | ICD-10-CM | POA: Diagnosis not present

## 2021-11-16 DIAGNOSIS — N186 End stage renal disease: Secondary | ICD-10-CM | POA: Diagnosis not present

## 2021-11-16 DIAGNOSIS — D509 Iron deficiency anemia, unspecified: Secondary | ICD-10-CM | POA: Diagnosis not present

## 2021-11-16 DIAGNOSIS — Z992 Dependence on renal dialysis: Secondary | ICD-10-CM | POA: Diagnosis not present

## 2021-11-19 DIAGNOSIS — E440 Moderate protein-calorie malnutrition: Secondary | ICD-10-CM | POA: Diagnosis not present

## 2021-11-19 DIAGNOSIS — N186 End stage renal disease: Secondary | ICD-10-CM | POA: Diagnosis not present

## 2021-11-19 DIAGNOSIS — N2581 Secondary hyperparathyroidism of renal origin: Secondary | ICD-10-CM | POA: Diagnosis not present

## 2021-11-19 DIAGNOSIS — Z992 Dependence on renal dialysis: Secondary | ICD-10-CM | POA: Diagnosis not present

## 2021-11-19 DIAGNOSIS — D509 Iron deficiency anemia, unspecified: Secondary | ICD-10-CM | POA: Diagnosis not present

## 2021-11-19 DIAGNOSIS — D631 Anemia in chronic kidney disease: Secondary | ICD-10-CM | POA: Diagnosis not present

## 2021-11-20 DIAGNOSIS — N186 End stage renal disease: Secondary | ICD-10-CM | POA: Diagnosis not present

## 2021-11-20 DIAGNOSIS — E440 Moderate protein-calorie malnutrition: Secondary | ICD-10-CM | POA: Diagnosis not present

## 2021-11-20 DIAGNOSIS — D631 Anemia in chronic kidney disease: Secondary | ICD-10-CM | POA: Diagnosis not present

## 2021-11-20 DIAGNOSIS — N2581 Secondary hyperparathyroidism of renal origin: Secondary | ICD-10-CM | POA: Diagnosis not present

## 2021-11-20 DIAGNOSIS — D509 Iron deficiency anemia, unspecified: Secondary | ICD-10-CM | POA: Diagnosis not present

## 2021-11-20 DIAGNOSIS — Z992 Dependence on renal dialysis: Secondary | ICD-10-CM | POA: Diagnosis not present

## 2021-11-21 ENCOUNTER — Telehealth: Payer: Self-pay

## 2021-11-21 NOTE — Progress Notes (Signed)
Chronic Care Management Pharmacy Assistant   Name: Eric Douglas  MRN: [id number] DOB: November 15, 1970  Reason for Encounter: CCM (Initial Questions)   Recent office visits:  07/20/2021 - Karl Ito, NP - Patient presented for Annual Wellness Visit. Labs: CMP, CBC, A1c, Lipid, TSH and DG Chest 2 view. Immunizations given: Influenza Quad PF, 6+ Mos.  06/08/2021 - Karl Ito, NP - Patient presented for end stage renal disease for transitions of care. Patient is on kidney transplant list at [facility].   Recent consult visits:  11/10/2021 - Fabian November - Telephone - Patient is approved for transplant. Patient is also financially approved for transplant.  11/03/2021 - EVENT MONITOR ALERT- Telephone - Alert: Atrial Fibrillation RVR HR 160 bpm and Ventricular Tachycardia.  10/30/2021 - EVENT MONITOR ALERT- Telephone - Alert: Atrial Fibrillation RVR HR 160 bpm 10/23/2021 - Dayna Dunn, PA - Cardiology- Orders only: cardiac event monitor.  09/19/2021 - Lamar Blinks, MD - Radiology - Patient presented for heart stress test.  08/22/2021 - Cardiology - Patient presented for Echocardiogram.  08/10/2021 - Sherrilyn Rist, MD - Pulmonary Disease - Patient presented for obstructive sleep apnea. Orders: Home sleep test. Stop due to completed course: cinacalcet (SENSIPAR) 60 MG tablet and VELPHORO 500 MG chewable tablet. 08/04/2021 - Lamar Blinks, MD - Cardiology - Patient presented for exercise stress test.   07/26/2021 - Otila Back - Cardiology - Patient presented for EKG.  06/10/2021 - Radiology - OP Visit - No other information.  06/08/2021 - Lacie Draft, PA - [facility] - Patient presented for Osteoarthritis of left knee joint and Pain of left knee joint.  Hospital visits:  Medication Reconciliation was completed by comparing discharge summary, patients EMR and Pharmacy list, and upon discussion with patient.  Admitted to the hospital on 10/22/2021 due to chest pain.  Discharge date was 10/23/2021. Discharged from [facility]?Medications Started at [facility] Discharge:?? -started apixaban (ELIQUIS) 2.5 MG TABS tablet -1 tab 2 times daily -started metoprolol tartrate (LOPRESSOR) 25 MG tablet -  1 tab 2 times daily  Medications Discontinued at Hospital Discharge: -Stopped Nebivolol 10 mg  Medications that remain the same after Hospital Discharge:??  -All other medications will remain the same.    Medications: Outpatient Encounter Medications as of 11/21/2021  Medication Sig   allopurinol (ZYLOPRIM) 100 MG tablet Take 100 mg by mouth every morning.   apixaban (ELIQUIS) 2.5 MG TABS tablet Take 1 tablet (2.5 mg total) by mouth 2 (two) times daily.   calcitRIOL (ROCALTROL) 0.25 MCG capsule Take 0.5 mcg by mouth every morning.   Calcium Polycarbophil (FIBERCON PO) Take 2 tablets by mouth every morning.   furosemide (LASIX) 40 MG tablet Take 40 mg by mouth every morning.   gabapentin (NEURONTIN) 300 MG capsule Take 300 mg by mouth at bedtime.   metoprolol tartrate (LOPRESSOR) 50 MG tablet Take 1 tablet (50 mg total) by mouth 2 (two) times daily.   sucroferric oxyhydroxide (VELPHORO) 500 MG chewable tablet Chew 1,000 mg by mouth See admin instructions. Chew 2 tablets (1000 mg) by mouth once or twice daily with meals   tamsulosin (FLOMAX) 0.4 MG CAPS capsule TAKE 1 CAPSULE BY MOUTH EVERY DAY (Patient taking differently: 0.4 mg every morning.)   testosterone cypionate (DEPOTESTOSTERONE CYPIONATE) 200 MG/ML injection INJECT 1 ML (200 MG TOTAL) INTO THE MUSCLE EVERY 14 (FOURTEEN) DAYS. MUST SCHEDULE ANNUAL EXAM (Patient taking differently: Inject 200 mg into the muscle every Saturday. MUST SCHEDULE ANNUAL EXAM)  No facility-administered encounter medications on file as of 11/21/2021.   Lab Results  Component Value Date/Time   HGBA1C 5.3 07/20/2021 09:31 AM   HGBA1C 5.1 06/07/2017 08:17 AM    BP Readings from Last 3 Encounters:  10/23/21 (!) 171/97   08/10/21 (!) 158/84  07/20/21 (!) 162/104   Patient contacted to review initial questions prior to visit with Charlene Brooke.  Have you seen any other providers since your last visit with PCP? Yes  Any changes in your medications or health? Yes - Patient is awaiting kidney transplant. Patient has been put on Lopressor 50 mg. Patient has an appointment with Cardiologist today.   Any side effects from any medications? No  Do you have an symptoms or problems not managed by your medications? No  Any concerns about your health right now? Yes Patient is going to address with the cardiologist how tired he has been getting. He doesn't know if it is due to the A-Fib or if it is something else.   Has your provider asked that you check blood pressure, blood sugar, or follow special diet at home? Yes Patient is on dialysis so he takes his blood pressure. Patient did not have the readings with him. Patient stated when he is doing dialysis he has to take his blood pressure every 30 minutes.   Do you get any type of exercise on a regular basis? Yes Patient tries to do the best he can.   Can you think of a goal you would like to reach for your health? Yes - Patient would like to lose some weight and get in shape.   Do you have any problems getting your medications? No  Is there anything that you would like to discuss during the appointment? No  Spoke with patient and reminded them to have all medications, supplements and any blood glucose and blood pressure readings available for review with pharmacist, at their telephone visit on 11/24/2021 at 11:00 am.    Star Rating Drugs:  Medication:  Last Fill: Day Supply Eliquis 2.5 mg  10/23/2021 30    Care Gaps: Annual wellness visit in last year? Yes 07/20/2021 Most Recent BP reading: 171/97 on 10/23/2021  Charlene Brooke, CPP notified  Marijean Niemann, [place] Clinical Pharmacy Assistant [phone]  Time Spent: 61 Minutes

## 2021-11-22 ENCOUNTER — Ambulatory Visit (HOSPITAL_COMMUNITY)
Admission: RE | Admit: 2021-11-22 | Discharge: 2021-11-22 | Disposition: A | Discharge status: Home / Self Care | Payer: Medicare Other | Source: Ambulatory Visit | Attending: Physician Assistant | Admitting: Physician Assistant

## 2021-11-22 ENCOUNTER — Other Ambulatory Visit: Payer: Self-pay

## 2021-11-22 ENCOUNTER — Encounter (HOSPITAL_COMMUNITY): Payer: Self-pay | Admitting: Physician Assistant

## 2021-11-22 VITALS — BP 128/92 | HR 130 | Ht 77.0 in | Wt 338.8 lb

## 2021-11-22 DIAGNOSIS — Z992 Dependence on renal dialysis: Secondary | ICD-10-CM | POA: Diagnosis not present

## 2021-11-22 DIAGNOSIS — N2581 Secondary hyperparathyroidism of renal origin: Secondary | ICD-10-CM | POA: Diagnosis not present

## 2021-11-22 DIAGNOSIS — E669 Obesity, unspecified: Secondary | ICD-10-CM | POA: Insufficient documentation

## 2021-11-22 DIAGNOSIS — Z7901 Long term (current) use of anticoagulants: Secondary | ICD-10-CM | POA: Diagnosis not present

## 2021-11-22 DIAGNOSIS — Z79899 Other long term (current) drug therapy: Secondary | ICD-10-CM | POA: Insufficient documentation

## 2021-11-22 DIAGNOSIS — Z6841 Body Mass Index (BMI) 40.0 and over, adult: Secondary | ICD-10-CM | POA: Diagnosis not present

## 2021-11-22 DIAGNOSIS — G4733 Obstructive sleep apnea (adult) (pediatric): Secondary | ICD-10-CM | POA: Diagnosis not present

## 2021-11-22 DIAGNOSIS — E440 Moderate protein-calorie malnutrition: Secondary | ICD-10-CM | POA: Diagnosis not present

## 2021-11-22 DIAGNOSIS — N186 End stage renal disease: Secondary | ICD-10-CM | POA: Insufficient documentation

## 2021-11-22 DIAGNOSIS — D631 Anemia in chronic kidney disease: Secondary | ICD-10-CM | POA: Diagnosis not present

## 2021-11-22 DIAGNOSIS — I120 Hypertensive chronic kidney disease with stage 5 chronic kidney disease or end stage renal disease: Secondary | ICD-10-CM | POA: Diagnosis not present

## 2021-11-22 DIAGNOSIS — I480 Paroxysmal atrial fibrillation: Secondary | ICD-10-CM | POA: Diagnosis not present

## 2021-11-22 DIAGNOSIS — D509 Iron deficiency anemia, unspecified: Secondary | ICD-10-CM | POA: Diagnosis not present

## 2021-11-22 DIAGNOSIS — I38 Endocarditis, valve unspecified: Secondary | ICD-10-CM | POA: Diagnosis not present

## 2021-11-22 MED ORDER — METOPROLOL TARTRATE 50 MG PO TABS: 100.0000 mg | ORAL_TABLET | Freq: Two times a day (BID) | ORAL | 3 refills | Status: DC

## 2021-11-22 MED ORDER — APIXABAN 2.5 MG PO TABS: 2.5000 mg | ORAL_TABLET | Freq: Two times a day (BID) | ORAL | 11 refills | Status: DC

## 2021-11-22 MED ORDER — AMIODARONE HCL 200 MG PO TABS: 200.0000 mg | ORAL_TABLET | Freq: Two times a day (BID) | ORAL | 6 refills | Status: DC

## 2021-11-22 NOTE — Progress Notes (Signed)
Primary Care Physician: Michela Pitcher, NP Primary Cardiologist: Eric Douglas Primary Electrophysiologist: none Referring Physician: Dr Eunice Blase Douglas Eric Douglas is a 51 y.o. male with a history of HTN, ESRD, atrial fibrillation who presents for consultation in the [facility]. The patient was initially diagnosed with atrial fibrillation 10/22/21 after presenting to the ED with chest discomfort. He spontaneously converted back to SR. He was placed on a 30 day event monitor to evaluate arrhythmia burden. The monitor detected afib on 10/30/21 and he was started on Eliquis for a CHADS2VASC score of 1. Today, patient reports that since 11/19/21 he has had near constant tachypalpitations and fatigue. He has also noted his weight is up and has intermittent orthopnea. He is pending a CPAP titration.   Today, he denies symptoms of chest pain, shortness of breath, PND, lower extremity edema, dizziness, presyncope, syncope, bleeding, or neurologic sequela. The patient is tolerating medications without difficulties and is otherwise without complaint today.    Atrial Fibrillation Risk Factors:  he does have symptoms or diagnosis of sleep apnea. he is pending CPAP titration  he does not have a history of rheumatic fever.   he has a BMI of Body mass index is 40.18 kg/m.Marland Kitchen Filed Weights   11/22/21 1344  Weight: (!) 153.7 kg    Family History  Problem Relation Age of Onset   Cancer Paternal Grandfather 57       brain cancer   Hypertension Other        GP   Stroke Other    Diabetes Other        DM - GP    Heart disease Neg Hx      Atrial Fibrillation Management history:  Previous antiarrhythmic drugs: none Previous cardioversions: none Previous ablations: none CHADS2VASC score: 1 Anticoagulation history: Eliquis   Past Medical History:  Diagnosis Date   Fibula fracture    ORIF 1990   Gout    non crystal proven    History of chickenpox    History of  kidney disease    HTN (hypertension)    Nephropathy    Ig A; dx 2002. micropscopic hematuria and proteinuria   Tibia fracture    ORIF 1990   Varicocele    L testes   Venous incompetence    R leg   Past Surgical History:  Procedure Laterality Date   APPENDECTOMY  1982   AV FISTULA PLACEMENT Left 06/27/2017   Procedure: LEFT ARM ARTERIOVENOUS (AV) FISTULA CREATION;  Surgeon: Waynetta Sandy, MD;  Location: [facility];  Service: Vascular;  Laterality: Left;   stab phlebectomy Left 12/17/2016   stab phlebectomy >20 incisions (left leg) by Tinnie Gens MD     Current Outpatient Medications  Medication Sig Dispense Refill   allopurinol (ZYLOPRIM) 100 MG tablet Take 100 mg by mouth every morning.  2   apixaban (ELIQUIS) 2.5 MG TABS tablet Take 1 tablet (2.5 mg total) by mouth 2 (two) times daily. 60 tablet 0   calcitRIOL (ROCALTROL) 0.25 MCG capsule Take 0.5 mcg by mouth every morning.     Calcium Polycarbophil (FIBERCON PO) Take 2 tablets by mouth every morning.     cinacalcet (SENSIPAR) 90 MG tablet Take 90 mg by mouth daily.     furosemide (LASIX) 40 MG tablet Take 40 mg by mouth every morning.     gabapentin (NEURONTIN) 300 MG capsule Take 300 mg by mouth at bedtime.     Methoxy PEG-Epoetin Beta (  MIRCERA IJ) Inject into the skin.     metoprolol tartrate (LOPRESSOR) 50 MG tablet Take 1 tablet (50 mg total) by mouth 2 (two) times daily. 180 tablet 3   mupirocin ointment (BACTROBAN) 2 % Apply topically.     sucroferric oxyhydroxide (VELPHORO) 500 MG chewable tablet Chew 1,000 mg by mouth See admin instructions. Chew 2 tablets (1000 mg) by mouth once or twice daily with meals     tamsulosin (FLOMAX) 0.4 MG CAPS capsule TAKE 1 CAPSULE BY MOUTH EVERY DAY 30 capsule 0   testosterone cypionate (DEPOTESTOSTERONE CYPIONATE) 200 MG/ML injection INJECT 1 ML (200 MG TOTAL) INTO THE MUSCLE EVERY 14 (FOURTEEN) DAYS. MUST SCHEDULE ANNUAL EXAM (Patient taking differently: Inject 200 mg into the  muscle every Saturday. MUST SCHEDULE ANNUAL EXAM) 2 mL 0   No current facility-administered medications for this encounter.    No Known Allergies  Social History   Socioeconomic History   Marital status: Divorced    Spouse name: Not on file   Number of children: 2   Years of education: Not on file   Highest education level: Not on file  Occupational History   Occupation: Service Tech    Comment: Charity fundraiser  Tobacco Use   Smoking status: Never   Smokeless tobacco: Never  Vaping Use   Vaping Use: Never used  Substance and Sexual Activity   Alcohol use: No    Alcohol/week: 0.0 standard drinks   Drug use: No   Sexual activity: Yes  Other Topics Concern   Not on file  Social History Narrative   Divorced, primary custody of son; Charity fundraiser; gets regular exercise.       Pt signed designated party release and gives no access to medical records. Can leave msg on cell [phone].    Social Determinants of Health   Financial Resource Strain: Not on file  Food Insecurity: Not on file  Transportation Needs: Not on file  Physical Activity: Not on file  Stress: Not on file  Social Connections: Not on file  Intimate Partner Violence: Not on file     ROS- All systems are reviewed and negative except as per the HPI above.  Physical Exam: Vitals:   11/22/21 1344  BP: (!) 128/92  Pulse: (!) 130  Weight: (!) 153.7 kg  Height: 6\' 5"  (1.956 m)    GEN- The patient is a well appearing obese male, alert and oriented x 3 today.   Head- normocephalic, atraumatic Eyes-  Sclera clear, conjunctiva pink Ears- hearing intact Oropharynx- clear Neck- supple  Lungs- Clear to ausculation bilaterally, normal work of breathing Heart- irregular rate and rhythm, tachycardia, no murmurs, rubs or gallops  GI- soft, NT, ND, + BS Extremities- no clubbing, cyanosis, or edema MS- no significant deformity or atrophy Skin- no rash or lesion Psych- euthymic mood, full  affect Neuro- strength and sensation are intact  Wt Readings from Last 3 Encounters:  11/22/21 (!) 153.7 kg  10/22/21 (!) 149 kg  08/10/21 (!) 148.3 kg    EKG today demonstrates  Afib with RVR Vent. rate 130 BPM PR interval * ms QRS duration 88 ms QT/QTcB 316/465 ms  Echo 10/23/21 demonstrated   1. Left ventricular ejection fraction, by estimation, is 55 to 60%. The  left ventricle has normal function. The left ventricle has no regional  wall motion abnormalities. The left ventricular internal cavity size was mildly dilated. There is mild left ventricular hypertrophy. Left ventricular diastolic parameters were normal.  2. Right ventricular systolic function is normal. The right ventricular  size is mildly enlarged. There is moderately elevated pulmonary artery systolic pressure. The estimated right ventricular systolic pressure is 25.1 mmHg.   3. Left atrial size was mildly dilated.   4. The aortic valve is tricuspid. Aortic valve regurgitation is not  visualized. Aortic valve sclerosis/calcification is present, without any  evidence of aortic stenosis.   5. The inferior vena cava is dilated in size with <50% respiratory  variability, suggesting right atrial pressure of 15 mmHg.   6. The mitral valve is abnormal. Moderate leaflet thickening. Moderate mitral valve regurgitation. MR jet is not well visualized on long axis images, but appears more significant on MV short axis image (image 17). Does have LA/LV dilatation. Consider TEE vs cardiac MRI for further evaluation   Epic records are reviewed at length today  CHA2DS2-VASc Score = 1  The patient's score is based upon: CHF History: 0 HTN History: 1 Diabetes History: 0 Stroke History: 0 Vascular Disease History: 0 Age Score: 0 Gender Score: 0       ASSESSMENT AND PLAN: 1. Paroxysmal Atrial Fibrillation (ICD10:  I48.0) The patient's CHA2DS2-VASc score is 1, indicating a 0.6% annual risk of stroke.   Patient in rapid afib  today. His afib episodes appear to be more frequent and lasting longer. ? Related to severe OSA. Increase Lopressor to 100 mg BID for rate control.  His rhythm control options are limited with ESRD.  Will start amiodarone 200 mg BID. Recent labs reviewed.  Continue Eliquis 2.5 mg (dose per Eric Douglas and Eric Moshe Cipro)  2. HTN Stable, med changes as above.  3. Obesity Body mass index is 40.18 kg/m. Lifestyle modification was discussed at length including regular exercise and weight reduction.  4. Obstructive sleep apnea Severe OSA The importance of adequate treatment of sleep apnea was discussed today in order to improve our ability to maintain sinus rhythm long term. Patient scheduled for CPAP titration on 12/14/21.  5. ESRD Followed by Eric Moshe Cipro  Patient has noted an increase in his weight this week.  He has a televisit with Eric Moshe Cipro tomorrow.   6. Valvular heart disease Moderate MR   Follow up in the AF clinic in one week.    [facility] [address] [place], [place] [address] [phone] 11/22/2021 2:06 PM

## 2021-11-22 NOTE — Patient Instructions (Signed)
Start Amiodarone 200mg  twice daily  Increase Metoprolol 100mg  daily

## 2021-11-23 DIAGNOSIS — D509 Iron deficiency anemia, unspecified: Secondary | ICD-10-CM | POA: Diagnosis not present

## 2021-11-23 DIAGNOSIS — D631 Anemia in chronic kidney disease: Secondary | ICD-10-CM | POA: Diagnosis not present

## 2021-11-23 DIAGNOSIS — N2581 Secondary hyperparathyroidism of renal origin: Secondary | ICD-10-CM | POA: Diagnosis not present

## 2021-11-23 DIAGNOSIS — N186 End stage renal disease: Secondary | ICD-10-CM | POA: Diagnosis not present

## 2021-11-23 DIAGNOSIS — E440 Moderate protein-calorie malnutrition: Secondary | ICD-10-CM | POA: Diagnosis not present

## 2021-11-23 DIAGNOSIS — Z992 Dependence on renal dialysis: Secondary | ICD-10-CM | POA: Diagnosis not present

## 2021-11-23 DIAGNOSIS — M1040 Other secondary gout, unspecified site: Secondary | ICD-10-CM | POA: Diagnosis not present

## 2021-11-24 ENCOUNTER — Ambulatory Visit (INDEPENDENT_AMBULATORY_CARE_PROVIDER_SITE_OTHER): Payer: Medicare Other | Admitting: Pharmacist

## 2021-11-24 ENCOUNTER — Other Ambulatory Visit: Payer: Self-pay

## 2021-11-24 DIAGNOSIS — N2581 Secondary hyperparathyroidism of renal origin: Secondary | ICD-10-CM | POA: Diagnosis not present

## 2021-11-24 DIAGNOSIS — D631 Anemia in chronic kidney disease: Secondary | ICD-10-CM | POA: Diagnosis not present

## 2021-11-24 DIAGNOSIS — N186 End stage renal disease: Secondary | ICD-10-CM | POA: Diagnosis not present

## 2021-11-24 DIAGNOSIS — E440 Moderate protein-calorie malnutrition: Secondary | ICD-10-CM | POA: Diagnosis not present

## 2021-11-24 DIAGNOSIS — I151 Hypertension secondary to other renal disorders: Secondary | ICD-10-CM

## 2021-11-24 DIAGNOSIS — Z992 Dependence on renal dialysis: Secondary | ICD-10-CM | POA: Diagnosis not present

## 2021-11-24 DIAGNOSIS — D509 Iron deficiency anemia, unspecified: Secondary | ICD-10-CM | POA: Diagnosis not present

## 2021-11-24 DIAGNOSIS — I480 Paroxysmal atrial fibrillation: Secondary | ICD-10-CM

## 2021-11-24 DIAGNOSIS — M1A30X0 Chronic gout due to renal impairment, unspecified site, without tophus (tophi): Secondary | ICD-10-CM

## 2021-11-24 NOTE — Progress Notes (Signed)
Chronic Care Management Pharmacy Note  11/24/2021 Name:  Eric Douglas MRN:  [id number] DOB:  1971-04-09  Summary: CCM Initial visit -Nephrologist recently stopped furosemide since pt is no longer making urine (on dialysis at home 4x weekly) -Query continued need for tamsulosin if pt is not making urine - advised he discuss with urologist -Pt will be starting amiodarone this weekend (HR 130 at Afib clinic 2/8);   Recommendations/Changes made from today's visit: -Advised pt to monitor HR closely while starting amiodarone; contact cardiology if HR < 50 -Advised pt discuss continued need for tamsulosin with urologist  Plan: -[place] will call patient 3 months for  -Pharmacist follow up televisit scheduled for 6 months -PCP 72-monthf/u due April 2023; not yet scheduled   Subjective: Eric Douglas an 51y.o. year old male who is a primary patient of Eric Pitcher NP.  The CCM team was consulted for assistance with disease management and care coordination needs.    Engaged with patient by telephone for initial visit in response to provider referral for pharmacy case management and/or care coordination services.   Consent to Services:  The patient was given the following information about Chronic Care Management services today, agreed to services, and gave verbal consent: 1. CCM service includes personalized support from designated clinical staff supervised by the primary care provider, including individualized plan of care and coordination with other care providers 2. 24/7 contact phone numbers for assistance for urgent and routine care needs. 3. Service will only be billed when office clinical staff spend 20 minutes or more in a month to coordinate care. 4. Only one practitioner may furnish and bill the service in a calendar month. 5.The patient may stop CCM services at any time (effective at the end of the month) by phone call to the office staff. 6. The  patient will be responsible for cost sharing (co-pay) of up to 20% of the service fee (after annual deductible is met). Patient agreed to services and consent obtained.  Patient Care Team: Eric Pitcher NP as PCP - General (Pain Medicine) Eric Parish MD as Consulting Physician (Nephrology) [facility] ([facility])  Recent office visits: 07/20/2021 - Eric Ito NP - Patient presented for Annual Wellness Visit. Labs: CMP, CBC, A1c, Lipid, TSH and DG Chest 2 view. Immunizations given: Flu. F/u 6 months.  06/08/2021 - Eric Ito NP - Patient presented for end stage renal disease for transitions of care. [facility]    Recent consult visits: 11/22/21 Eric Douglas (Afib clinic): start amidoarone 200 mg BID, increase metoprolol to 100 mg BID. 11/10/2021 - Eric Douglas - Telephone - Patient is approved for transplant. Patient is also financially approved for transplant.  11/03/21 cardiology event monitor - increase metoprolol to 50 mg BID 10/23/2021 - Eric Dunn, Eric - Cardiology- Orders only: cardiac event monitor.  08/22/2021 - Cardiology - Patient presented for Echocardiogram.  08/10/2021 - Eric Rist MD - Pulmonary Disease - Patient presented for obstructive sleep apnea. Orders: Home sleep test. Stop due to completed course: cinacalcet (SENSIPAR) 60 MG tablet and VELPHORO 500 MG chewable tablet. 08/04/2021 - Eric Blinks MD - Cardiology - Patient presented for exercise stress test.   07/26/2021 - Eric Douglas- Cardiology - Patient presented for EKG.  06/10/2021 - Radiology - OP Visit - No other information.  06/08/2021 - Eric Draft Eric - [facility] - Patient presented for Osteoarthritis of left  knee joint and Pain of left knee joint.  Hospital visits: Medication Reconciliation was completed by comparing discharge summary, patients EMR and Pharmacy list, and upon discussion with  patient.   Admitted to the hospital on 10/22/2021 due to Afib (new onset). Discharge date was 10/23/2021. Discharged from [facility]?Medications Started at [facility] Discharge:?? -started apixaban (ELIQUIS) 2.5 MG TABS tablet -1 tab 2 times daily -started metoprolol tartrate (LOPRESSOR) 25 MG tablet -  1 tab 2 times daily   Medications Discontinued at Hospital Discharge: -Stopped Nebivolol 10 mg   Medications that remain the same after Hospital Discharge:??  -All other medications will remain the same.     Objective:  Lab Results  Component Value Date   CREATININE 10.89 (H) 10/23/2021   BUN 39 (H) 10/23/2021   GFR 4.81 (LL) 07/20/2021   GFRNONAA 5 (L) 10/23/2021   GFRAA 3 (L) 10/27/2019   NA 136 10/23/2021   K 4.0 10/23/2021   CALCIUM 8.8 (L) 10/23/2021   CO2 25 10/23/2021   GLUCOSE 86 10/23/2021    Lab Results  Component Value Date/Time   HGBA1C 5.3 07/20/2021 09:31 AM   HGBA1C 5.1 06/07/2017 08:17 AM   GFR 4.81 (LL) 07/20/2021 09:31 AM   GFR 10.37 (LL) 06/07/2017 08:17 AM    Last diabetic Eye exam: No results found for: HMDIABEYEEXA  Last diabetic Foot exam: No results found for: HMDIABFOOTEX   Lab Results  Component Value Date   CHOL 116 07/20/2021   HDL 40.30 07/20/2021   LDLCALC 64 07/20/2021   TRIG 56.0 07/20/2021   CHOLHDL 3 07/20/2021    Hepatic Function Latest Ref Rng & Units 07/20/2021 10/27/2019 06/14/2019  Total Protein 6.0 - 8.3 g/dL 6.3 7.0 6.7  Albumin 3.5 - 5.2 g/dL 4.3 3.7 3.8  AST 0 - 37 U/L 12 10(L) 16  ALT 0 - 53 U/L 17 16 18   Alk Phosphatase 39 - 117 U/L 54 45 59  Total Bilirubin 0.2 - 1.2 mg/dL 0.5 0.6 0.5    Lab Results  Component Value Date/Time   TSH 2.628 10/22/2021 04:33 PM   TSH 2.28 07/20/2021 09:31 AM   TSH 0.98 05/03/2016 08:52 AM    CBC Latest Ref Rng & Units 10/23/2021 10/22/2021 07/20/2021  WBC 4.0 - 10.5 K/uL 5.4 5.4 5.2  Hemoglobin 13.0 - 17.0 g/dL 8.4(L) 9.3(L) 8.9 Repeated and verified X2.(L)  Hematocrit  39.0 - 52.0 % 25.2(L) 27.4(L) 25.9 Repeated and verified X2.(L)  Platelets 150 - 400 K/uL 115(L) 131(L) 131.0(L)    No results found for: VD25OH  Clinical ASCVD: No  The ASCVD Risk score (Arnett DK, et al., 2019) failed to calculate for the following reasons:   The valid total cholesterol range is 130 to 320 mg/dL    CHA2DS2/VAS Stroke Risk Points  Current as of 15 minutes ago     2 >= 2 Points: High Risk  1 - 1.99 Points: Medium Risk  0 Points: Low Risk    No Change      Points Metrics  0 Has Congestive Heart Failure:  No    Current as of 15 minutes ago  0 Has Vascular Disease:  No    Current as of 15 minutes ago  1 Has Hypertension:  Yes    Current as of 15 minutes ago  0 Age:  26    Current as of 15 minutes ago  1 Has Diabetes:  Yes     Current as of 15 minutes ago  0 Had Stroke:  No  Had TIA:  No  Had Thromboembolism:  No    Current as of 15 minutes ago  0 Male:  No    Current as of 15 minutes ago     Depression screen [facility] 2/9 06/08/2021 07/24/2019 06/05/2017  Decreased Interest 0 0 0  Down, Depressed, Hopeless 0 0 0  PHQ - 2 Score 0 0 0     Social History   Tobacco Use  Smoking Status Never  Smokeless Tobacco Never   BP Readings from Last 3 Encounters:  11/22/21 (!) 128/92  10/23/21 (!) 171/97  08/10/21 (!) 158/84   Pulse Readings from Last 3 Encounters:  11/22/21 (!) 130  10/23/21 75  08/10/21 66   Wt Readings from Last 3 Encounters:  11/22/21 (!) 338 lb 12.8 oz (153.7 kg)  10/22/21 (!) 328 lb 6.4 oz (149 kg)  08/10/21 (!) 327 lb (148.3 kg)   BMI Readings from Last 3 Encounters:  11/22/21 40.18 kg/m  10/22/21 38.94 kg/m  08/10/21 38.78 kg/m    Assessment/Interventions: Review of patient past medical history, allergies, medications, health status, including review of consultants reports, laboratory and other test data, was performed as part of comprehensive evaluation and provision of chronic care management services.   SDOH:  (Social  Determinants of Health) assessments and interventions performed: Yes SDOH Interventions    Flowsheet Row Most Recent Value  SDOH Interventions   Food Insecurity Interventions Intervention Not Indicated  Financial Strain Interventions Intervention Not Indicated      SDOH Screenings   Alcohol Screen: Not on file  Depression (PHQ2-9): Low Risk    PHQ-2 Score: 0  Financial Resource Strain: Low Risk    Difficulty of Paying Living Expenses: Not hard at all  Food Insecurity: No Food Insecurity   Worried About Charity fundraiser in the Last Year: Never true   Ran Out of Food in the Last Year: Never true  Housing: Not on file  Physical Activity: Not on file  Social Connections: Not on file  Stress: Not on file  Tobacco Use: Low Risk    Smoking Tobacco Use: Never   Smokeless Tobacco Use: Never   Passive Exposure: Not on file  Transportation Needs: Not on file    [place]  No Known Allergies  Medications Reviewed Today     Reviewed by Charlton Douglas, [facility] (Pharmacist) on 11/24/21 at 1141  Med List Status: <None>   Medication Order Taking? Sig Documenting Provider Last Dose Status Informant  allopurinol (ZYLOPRIM) 100 MG tablet [id number] Yes Take 100 mg by mouth every morning. [provider] Taking Active Self, Pharmacy Records           Med Note ([place] Mar 21, 2021  3:17 PM)    amiodarone (PACERONE) 200 MG tablet [id number] Yes Take 1 tablet (200 mg total) by mouth 2 (two) times daily. Douglas, Lorenzo R, Eric Taking Active   apixaban (ELIQUIS) 2.5 MG TABS tablet [id number] Yes Take 1 tablet (2.5 mg total) by mouth 2 (two) times daily. Douglas, Clint R, Eric Taking Active   calcitRIOL (ROCALTROL) 0.25 MCG capsule [id number] Yes Take 0.5 mcg by mouth every morning. [provider] Taking Active Self, Pharmacy Records  Calcium Polycarbophil (FIBERCON PO) [id number] Yes Take 2 tablets by mouth every morning. [provider] Taking Active Self   cinacalcet (SENSIPAR) 90 MG tablet [id number] Yes Take 90 mg by mouth daily. [provider] Taking Active  gabapentin (NEURONTIN) 300 MG capsule [id number] Yes Take 300 mg by mouth at bedtime. [provider] Taking Active Self  Methoxy PEG-Epoetin Beta (MIRCERA IJ) [id number] Yes Inject into the skin. [provider] Taking Active   metoprolol tartrate (LOPRESSOR) 50 MG tablet [id number] Yes Take 2 tablets (100 mg total) by mouth 2 (two) times daily. Douglas, [place] R, Eric Taking Active   mupirocin ointment (BACTROBAN) 2 % [id number] Yes Apply topically. [provider] Taking Active   sucroferric oxyhydroxide (VELPHORO) 500 MG chewable tablet [id number] Yes Chew 1,000 mg by mouth See admin instructions. Chew 2 tablets (1000 mg) by mouth once or twice daily with meals [provider] Taking Active Self, Pharmacy Records  tamsulosin (FLOMAX) 0.4 MG CAPS capsule [id number] Yes TAKE 1 CAPSULE BY MOUTH EVERY DAY Baity, Coralie Keens, NP Taking Active Self, Pharmacy Records  testosterone cypionate (DEPOTESTOSTERONE CYPIONATE) 200 MG/ML injection [id number] Yes INJECT 1 ML (200 MG TOTAL) INTO THE MUSCLE EVERY 14 (FOURTEEN) DAYS. MUST SCHEDULE ANNUAL EXAM  Patient taking differently: Inject 100 mg into the muscle every Saturday. MUST SCHEDULE ANNUAL EXAM   [place], Coralie Keens, NP Taking Active Self, Pharmacy Records            Patient Active Problem List   Diagnosis Date Noted   Chest pain 10/22/2021   Paroxysmal atrial fibrillation ([place]) 10/22/2021   Atrial fibrillation with RVR ([place]) 10/22/2021   HTN (hypertension) 10/22/2021   Mitral valve regurgitation 10/22/2021   Class 2 severe obesity due to excess calories with serious comorbidity and body mass index (BMI) of 37.0 to 37.9 in adult ([place]) 07/20/2021   DOE (dyspnea on exertion) 07/20/2021   Medicare annual wellness visit, subsequent 07/20/2021   Vertigo 03/21/2021   ESRD (end stage renal disease) ([place])  11/12/2017   Varicose veins of left lower extremity with complications 46/50/3546   Gout 11/04/2008   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   IgA nephropathy 11/04/2008    Immunization History  Administered Date(s) Administered   Hepatitis B, adult 01/03/2018, 03/10/2018, 04/09/2018, 06/18/2018, 11/13/2019, 12/14/2019   Influenza,inj,Quad PF,6+ Mos 07/24/2019, 07/20/2021   Influenza-Unspecified 06/15/2018   PFIZER(Purple Top)SARS-COV-2 Vaccination 12/28/2019, 01/19/2020   Pneumococcal Conjugate-13 12/19/2017   Td 12/02/2008   Tdap 12/03/2018    Conditions to be addressed/monitored:  Hypertension, Atrial Fibrillation, BPH, Gout, and ESRD  Care Plan : [place]  Updates made by Charlton Douglas, [place] since 11/24/2021 12:00 AM     Problem: Hypertension, Atrial Fibrillation, BPH, Gout, and ESRD   Priority: High     Long-Range Goal: Disease mgmt   Start Date: 11/24/2021  Expected End Date: 11/24/2022  This Visit's Progress: On track  Priority: High  Note:   Current Barriers:  Unable to independently monitor therapeutic efficacy  Pharmacist Clinical Goal(s):  Patient will achieve adherence to monitoring guidelines and medication adherence to achieve therapeutic efficacy through collaboration with PharmD and provider.   Interventions: 1:1 collaboration with Michela Pitcher, NP regarding development and update of comprehensive plan of care as evidenced by provider attestation and co-signature Inter-disciplinary care team collaboration (see longitudinal plan of care) Comprehensive medication review performed; medication list updated in electronic medical record  Hypertension (BP goal <130/80) -Controlled - BP is near goal at home - he checks every 30 min during dialysis sessions; pt reports nephrology stopped furosemide recently since he is not making much urine and it is not doing anything -Current home readings: 130s/80s after dialysis; HR  130-140 -Current treatment:  Metoprolol tartrate 50 mg - 2 tab BID - Appropriate, Effective, Safe, Accessible Furosemide 40 mg daily - stopped 11/23/21 per nephrology -Medications previously tried: amlodipine, benazepril, bystolic,   -Denies hypotensive/hypertensive symptoms -Educated on BP goals and benefits of medications for prevention of heart attack, stroke and kidney damage; Importance of home blood pressure monitoring; -Counseled to monitor BP at home daily, document, and provide log at future appointments -Recommended to continue current medication  Atrial Fibrillation (Goal: prevent stroke and major bleeding) -Not ideally controlled - pt reports HR at home in 120-130s; he was prescribed amiodarone 2 days ago but has not picked it up yet, he plans to pick it up today; he will f/u with Afib clinic in 1 month for f/u labwork (thyroid, liver tests) -CHADSVASC: 1; New dx 10/2021; started with Afib clinic 11/22/21 -Current treatment: Amiodarone 200 mg BID - Appropriate, Query Effective, Query Safe, Accessible Metoprolol tartrate 50 mg - 2 tab BID - Appropriate, Effective, Safe, Accessible Eliquis 2.5 mg BID - Appropriate, Effective, Safe, Accessible -Medications previously tried: n/a -Home BP and HR readings: HR normally 60-70, has been 120-130s lately  -Counseled on increased risk of stroke due to Afib and benefits of anticoagulation for stroke prevention; importance of adherence to anticoagulant exactly as prescribed; bleeding risk associated with Eliquis and importance of self-monitoring for signs/symptoms of bleeding; seeking medical attention after a head injury or if there is blood in the urine/stool; -Discussed amiodarone may convert him to NSR and may lead to lower HR with recent increase in metoprolol; advised pt monitor HR very closely over next several days as he starts amiodarone, contact cardiology if HR < 50 -Recommended to continue current medication  ESRD (Goal: maintain status  until transplant) -Controlled -ESRD d/t IgA nephropathy. Patient followed by Dr. Vela Prose kidney. He does hemodialysis at home 4 times weekly. States he does Sunday, Tuesday, Wednesday, Thursday. States in touch with nephrologist monthly and has labs drawn by nurse monthly. He also sees [employer] once a year for updated labs as he is on the kidney transplant list. -Current treatment  Mircera (Epoeitin-beta) inj w/ dialysis monthly Calcitriol 0.25 mcg - 2 tab daily Velphoro (sucroferric oxyhydroxyide) 500 mg - 2 tab BID w/ meals Sensipar (cinacalacet) 90 mg daily -Recommended to continue current medication  RLS / Neuropathy (Goal: manage symptoms) -Controlled - per pt report, neuropathy comes and goes and is more annoying than debilitating -Current treatment  Gabapentin 300 mg daily HS - Appropriate, Effective, Safe, Accessible -Medications previously tried: amitriptyline  -Recommended to continue current medication  Gout (Goal: prevent flares) -Controlled - last flare years ago per patient -Current treatment  Allopurinol 100 mg daily - Appropriate, Effective, Safe, Accessible -Recommended to continue current medication  BPH (Goal: manage symptoms) -Controlled - pt has hx of BPH, however he is no longer making urine (on dialysis 4x per week), so it is unclear how tamsulosin is still benefiting him -Current treatment  Tamsulosin 0.4 mg daily - Query Appropriate, Effective, Safe, Accessible -Advised pt to discuss with urology continued use of tamsulosin now that he is no longer making urine  Hypotestosteronism (Goal: manage symptoms) -Controlled - per pt report; he splits his prescribed 200 mg q14 day dose into 100 mg q 7 days and feels well with this -Current treatment  Testosterone cypionate 200 mg/ml - 100 mg q7 days -Recommended to continue current medication  Health Maintenance -Vaccine gaps: Shingrix, covid booster -Current therapy:  Fibercon (calcium  polycarbophil) - 2 tab daily -Patient is  satisfied with current therapy and denies issues -Recommended to continue current medication  Patient Goals/Self-Care Activities Patient will:  - take medications as prescribed as evidenced by patient report and record review focus on medication adherence by pill box check blood pressure daily, document, and provide at future appointments -Monitor HR closely while starting amiodarone -Discuss if tamsulosin is still needed with urologist      Medication Assistance: None required.  Patient affirms current coverage meets needs.  Compliance/Adherence/Medication fill history: Care Gaps: Hep C screening  Star-Rating Drugs: None  Patient's preferred pharmacy is:  [id number] [place] [address]Phone: [phone]: [phone] [facility] [address]Phone: [phone]: [phone] Uses pill box? Yes Pt endorses 100% compliance  We discussed: Current pharmacy is preferred with insurance plan and patient is satisfied with pharmacy services Patient decided to: Continue current medication management strategy  Care Plan and Follow Up Patient Decision:  Patient agrees to Care Plan and Follow-up.  Plan: Telephone follow up appointment with care management team member scheduled for:  6 months  LCharlene Brooke PharmD, BCACP Clinical Pharmacist [phone]

## 2021-11-24 NOTE — Patient Instructions (Signed)
Visit Information  Phone number for Pharmacist: [phone]  Thank you for meeting with me to discuss your medications! I look forward to working with you to achieve your health care goals. Below is a summary of what we talked about during the visit:   Goals Addressed             This Visit's Progress    Track and Manage My Blood Pressure-Hypertension       Timeframe:  Long-Range Goal Priority:  High Start Date:        11/24/21                     Expected End Date:    11/24/22                   Follow Up Date August 2023   - check blood pressure daily - choose a place to take my blood pressure (home, clinic or office, retail store) - write blood pressure results in a log or diary    Why is this important?   You won't feel high blood pressure, but it can still hurt your blood vessels.  High blood pressure can cause heart or kidney problems. It can also cause a stroke.  Making lifestyle changes like losing a little weight or eating less salt will help.  Checking your blood pressure at home and at different times of the day can help to control blood pressure.  If the doctor prescribes medicine remember to take it the way the doctor ordered.  Call the office if you cannot afford the medicine or if there are questions about it.     Notes:         Care Plan : [place]  Updates made by Charlton Haws, RPH since 11/24/2021 12:00 AM     Problem: Hypertension, Atrial Fibrillation, BPH, Gout, and ESRD   Priority: High     Long-Range Goal: Disease mgmt   Start Date: 11/24/2021  Expected End Date: 11/24/2022  This Visit's Progress: On track  Priority: High  Note:   Current Barriers:  Unable to independently monitor therapeutic efficacy  Pharmacist Clinical Goal(s):  Patient will achieve adherence to monitoring guidelines and medication adherence to achieve therapeutic efficacy through collaboration with PharmD and provider.   Interventions: 1:1  collaboration with Michela Pitcher, NP regarding development and update of comprehensive plan of care as evidenced by provider attestation and co-signature Inter-disciplinary care team collaboration (see longitudinal plan of care) Comprehensive medication review performed; medication list updated in electronic medical record  Hypertension (BP goal <130/80) -Controlled - BP is near goal at home - he checks every 30 min during dialysis sessions; pt reports nephrology stopped furosemide recently since he is not making much urine and it is not doing anything -Current home readings: 130s/80s after dialysis; HR 130-140 -Current treatment: Metoprolol tartrate 50 mg - 2 tab BID - Appropriate, Effective, Safe, Accessible Furosemide 40 mg daily - stopped 11/23/21 per nephrology -Medications previously tried: amlodipine, benazepril, bystolic,   -Denies hypotensive/hypertensive symptoms -Educated on BP goals and benefits of medications for prevention of heart attack, stroke and kidney damage; Importance of home blood pressure monitoring; -Counseled to monitor BP at home daily, document, and provide log at future appointments -Recommended to continue current medication  Atrial Fibrillation (Goal: prevent stroke and major bleeding) -Not ideally controlled - pt reports HR at home in 120-130s; he was prescribed amiodarone 2 days ago but has not picked  it up yet, he plans to pick it up today; he will f/u with Afib clinic in 1 month for f/u labwork (thyroid, liver tests) -CHADSVASC: 1; New dx 10/2021; started with Afib clinic 11/22/21 -Current treatment: Amiodarone 200 mg BID - Appropriate, Query Effective, Query Safe, Accessible Metoprolol tartrate 50 mg - 2 tab BID - Appropriate, Effective, Safe, Accessible Eliquis 2.5 mg BID - Appropriate, Effective, Safe, Accessible -Medications previously tried: n/a -Home BP and HR readings: HR normally 60-70, has been 120-130s lately  -Counseled on increased risk of stroke  due to Afib and benefits of anticoagulation for stroke prevention; importance of adherence to anticoagulant exactly as prescribed; bleeding risk associated with Eliquis and importance of self-monitoring for signs/symptoms of bleeding; seeking medical attention after a head injury or if there is blood in the urine/stool; -Discussed amiodarone may convert him to NSR and may lead to lower HR with recent increase in metoprolol; advised pt monitor HR very closely over next several days as he starts amiodarone, contact cardiology if HR < 50 -Recommended to continue current medication  ESRD (Goal: maintain status until transplant) -Controlled -ESRD d/t IgA nephropathy. Patient followed by Dr. Vela Prose kidney. He does hemodialysis at home 4 times weekly. States he does Sunday, Tuesday, Wednesday, Thursday. States in touch with nephrologist monthly and has labs drawn by nurse monthly. He also sees [employer] once a year for updated labs as he is on the kidney transplant list. -Current treatment  Mircera (Epoeitin-beta) inj w/ dialysis monthly Calcitriol 0.25 mcg - 2 tab daily Velphoro (sucroferric oxyhydroxyide) 500 mg - 2 tab BID w/ meals Sensipar (cinacalacet) 90 mg daily -Recommended to continue current medication  RLS / Neuropathy (Goal: manage symptoms) -Controlled - per pt report, neuropathy comes and goes and is more annoying than debilitating -Current treatment  Gabapentin 300 mg daily HS - Appropriate, Effective, Safe, Accessible -Medications previously tried: amitriptyline  -Recommended to continue current medication  Gout (Goal: prevent flares) -Controlled - last flare years ago per patient -Current treatment  Allopurinol 100 mg daily - Appropriate, Effective, Safe, Accessible -Recommended to continue current medication  BPH (Goal: manage symptoms) -Controlled - pt has hx of BPH, however he is no longer making urine (on dialysis 4x per week), so it is unclear how  tamsulosin is still benefiting him -Current treatment  Tamsulosin 0.4 mg daily - Query Appropriate, Effective, Safe, Accessible -Advised pt to discuss with urology continued use of tamsulosin now that he is no longer making urine  Hypotestosteronism (Goal: manage symptoms) -Controlled - per pt report; he splits his prescribed 200 mg q14 day dose into 100 mg q 7 days and feels well with this -Current treatment  Testosterone cypionate 200 mg/ml - 100 mg q7 days -Recommended to continue current medication  Health Maintenance -Vaccine gaps: Shingrix, covid booster -Current therapy:  Fibercon (calcium polycarbophil) - 2 tab daily -Patient is satisfied with current therapy and denies issues -Recommended to continue current medication  Patient Goals/Self-Care Activities Patient will:  - take medications as prescribed as evidenced by patient report and record review focus on medication adherence by pill box check blood pressure daily, document, and provide at future appointments -Monitor HR closely while starting amiodarone -Discuss if tamsulosin is still needed with urologist      Mr. Lambson was given information about Chronic Care Management services today including:  CCM service includes personalized support from designated clinical staff supervised by his physician, including individualized plan of care and coordination with other care providers 24/7  contact phone numbers for assistance for urgent and routine care needs. Standard insurance, coinsurance, copays and deductibles apply for chronic care management only during months in which we provide at least 20 minutes of these services. Most insurances cover these services at 100%, however patients may be responsible for any copay, coinsurance and/or deductible if applicable. This service may help you avoid the need for more expensive face-to-face services. Only one practitioner may furnish and bill the service in a calendar month. The  patient may stop CCM services at any time (effective at the end of the month) by phone call to the office staff.  Patient agreed to services and verbal consent obtained.   Patient verbalizes understanding of instructions and care plan provided today and agrees to view in [place]. Active MyChart status confirmed with patient.   Telephone follow up appointment with pharmacy team member scheduled for: 6 months  Charlene Brooke, PharmD, [facility] Clinical Pharmacist [facility] [phone]

## 2021-11-26 DIAGNOSIS — N2581 Secondary hyperparathyroidism of renal origin: Secondary | ICD-10-CM | POA: Diagnosis not present

## 2021-11-26 DIAGNOSIS — D631 Anemia in chronic kidney disease: Secondary | ICD-10-CM | POA: Diagnosis not present

## 2021-11-26 DIAGNOSIS — E440 Moderate protein-calorie malnutrition: Secondary | ICD-10-CM | POA: Diagnosis not present

## 2021-11-26 DIAGNOSIS — Z992 Dependence on renal dialysis: Secondary | ICD-10-CM | POA: Diagnosis not present

## 2021-11-26 DIAGNOSIS — D509 Iron deficiency anemia, unspecified: Secondary | ICD-10-CM | POA: Diagnosis not present

## 2021-11-26 DIAGNOSIS — N186 End stage renal disease: Secondary | ICD-10-CM | POA: Diagnosis not present

## 2021-11-27 DIAGNOSIS — Z992 Dependence on renal dialysis: Secondary | ICD-10-CM | POA: Diagnosis not present

## 2021-11-27 DIAGNOSIS — N186 End stage renal disease: Secondary | ICD-10-CM | POA: Diagnosis not present

## 2021-11-27 DIAGNOSIS — D509 Iron deficiency anemia, unspecified: Secondary | ICD-10-CM | POA: Diagnosis not present

## 2021-11-27 DIAGNOSIS — N2581 Secondary hyperparathyroidism of renal origin: Secondary | ICD-10-CM | POA: Diagnosis not present

## 2021-11-27 DIAGNOSIS — D631 Anemia in chronic kidney disease: Secondary | ICD-10-CM | POA: Diagnosis not present

## 2021-11-27 DIAGNOSIS — E440 Moderate protein-calorie malnutrition: Secondary | ICD-10-CM | POA: Diagnosis not present

## 2021-11-29 ENCOUNTER — Other Ambulatory Visit: Payer: Self-pay

## 2021-11-29 ENCOUNTER — Ambulatory Visit (HOSPITAL_COMMUNITY)
Admission: RE | Admit: 2021-11-29 | Discharge: 2021-11-29 | Disposition: A | Discharge status: Home / Self Care | Payer: Medicare Other | Source: Ambulatory Visit | Attending: Physician Assistant | Admitting: Physician Assistant

## 2021-11-29 ENCOUNTER — Encounter (HOSPITAL_COMMUNITY): Payer: Self-pay | Admitting: Physician Assistant

## 2021-11-29 VITALS — BP 144/98 | HR 113 | Ht 77.0 in | Wt 335.4 lb

## 2021-11-29 DIAGNOSIS — D509 Iron deficiency anemia, unspecified: Secondary | ICD-10-CM | POA: Diagnosis not present

## 2021-11-29 DIAGNOSIS — Z7901 Long term (current) use of anticoagulants: Secondary | ICD-10-CM | POA: Diagnosis not present

## 2021-11-29 DIAGNOSIS — I340 Nonrheumatic mitral (valve) insufficiency: Secondary | ICD-10-CM | POA: Diagnosis not present

## 2021-11-29 DIAGNOSIS — Z6839 Body mass index (BMI) 39.0-39.9, adult: Secondary | ICD-10-CM | POA: Diagnosis not present

## 2021-11-29 DIAGNOSIS — E669 Obesity, unspecified: Secondary | ICD-10-CM | POA: Insufficient documentation

## 2021-11-29 DIAGNOSIS — I4819 Other persistent atrial fibrillation: Secondary | ICD-10-CM | POA: Insufficient documentation

## 2021-11-29 DIAGNOSIS — D631 Anemia in chronic kidney disease: Secondary | ICD-10-CM | POA: Diagnosis not present

## 2021-11-29 DIAGNOSIS — E440 Moderate protein-calorie malnutrition: Secondary | ICD-10-CM | POA: Diagnosis not present

## 2021-11-29 DIAGNOSIS — I120 Hypertensive chronic kidney disease with stage 5 chronic kidney disease or end stage renal disease: Secondary | ICD-10-CM | POA: Insufficient documentation

## 2021-11-29 DIAGNOSIS — D6869 Other thrombophilia: Secondary | ICD-10-CM | POA: Diagnosis not present

## 2021-11-29 DIAGNOSIS — G4733 Obstructive sleep apnea (adult) (pediatric): Secondary | ICD-10-CM | POA: Diagnosis not present

## 2021-11-29 DIAGNOSIS — Z992 Dependence on renal dialysis: Secondary | ICD-10-CM | POA: Diagnosis not present

## 2021-11-29 DIAGNOSIS — I480 Paroxysmal atrial fibrillation: Secondary | ICD-10-CM

## 2021-11-29 DIAGNOSIS — Z79899 Other long term (current) drug therapy: Secondary | ICD-10-CM | POA: Insufficient documentation

## 2021-11-29 DIAGNOSIS — N2581 Secondary hyperparathyroidism of renal origin: Secondary | ICD-10-CM | POA: Diagnosis not present

## 2021-11-29 DIAGNOSIS — N186 End stage renal disease: Secondary | ICD-10-CM | POA: Insufficient documentation

## 2021-11-29 NOTE — Progress Notes (Signed)
Primary Care Physician: Michela Pitcher, NP Primary Cardiologist: Dr Johnsie Cancel Primary Electrophysiologist: none Referring Physician: Dr Eunice Blase Eric Douglas is a 51 y.o. male with a history of HTN, ESRD, atrial fibrillation who presents for follow up in the [facility]. The patient was initially diagnosed with atrial fibrillation 10/22/21 after presenting to the ED with chest discomfort. He spontaneously converted back to SR. He was placed on a 30 day event monitor to evaluate arrhythmia burden. The monitor detected afib on 10/30/21 and he was started on Eliquis for a CHADS2VASC score of 1. Patient reports that since 11/19/21 he has had near constant tachypalpitations and fatigue. He is pending a CPAP titration.   On follow up today, patient remains in afib. His heart rate is improved but still elevated. He feels fatigued and SOB like "he is running sprints." He denies any bleeding issues on anticoagulation. His weight is down about 3 lbs. He does have some abdominal fullness.   Today, he denies symptoms of palpitations, chest pain, PND, lower extremity edema, dizziness, presyncope, syncope, bleeding, or neurologic sequela. The patient is tolerating medications without difficulties and is otherwise without complaint today.    Atrial Fibrillation Risk Factors:  he does have symptoms or diagnosis of sleep apnea. he is pending CPAP titration  he does not have a history of rheumatic fever.   he has a BMI of Body mass index is 39.77 kg/m.Marland Kitchen Filed Weights   11/29/21 1500  Weight: (!) 152.1 kg    Family History  Problem Relation Age of Onset   Cancer Paternal Grandfather 18       brain cancer   Hypertension Other        GP   Stroke Other    Diabetes Other        DM - GP    Heart disease Neg Hx      Atrial Fibrillation Management history:  Previous antiarrhythmic drugs: amiodarone  Previous cardioversions: none Previous ablations: none CHADS2VASC  score: 1 Anticoagulation history: Eliquis   Past Medical History:  Diagnosis Date   Fibula fracture    ORIF 1990   Gout    non crystal proven    History of chickenpox    History of kidney disease    HTN (hypertension)    Nephropathy    Ig A; dx 2002. micropscopic hematuria and proteinuria   Tibia fracture    ORIF 1990   Varicocele    L testes   Venous incompetence    R leg   Past Surgical History:  Procedure Laterality Date   APPENDECTOMY  1982   AV FISTULA PLACEMENT Left 06/27/2017   Procedure: LEFT ARM ARTERIOVENOUS (AV) FISTULA CREATION;  Surgeon: Waynetta Sandy, MD;  Location: [facility];  Service: Vascular;  Laterality: Left;   stab phlebectomy Left 12/17/2016   stab phlebectomy >20 incisions (left leg) by Tinnie Gens MD     Current Outpatient Medications  Medication Sig Dispense Refill   allopurinol (ZYLOPRIM) 100 MG tablet Take 100 mg by mouth every morning.  2   amiodarone (PACERONE) 200 MG tablet Take 1 tablet (200 mg total) by mouth 2 (two) times daily. 60 tablet 6   apixaban (ELIQUIS) 2.5 MG TABS tablet Take 1 tablet (2.5 mg total) by mouth 2 (two) times daily. 60 tablet 11   calcitRIOL (ROCALTROL) 0.25 MCG capsule Take 0.5 mcg by mouth every morning.     Calcium Polycarbophil (FIBERCON PO) Take 2 tablets by  mouth every morning.     cinacalcet (SENSIPAR) 90 MG tablet Take 90 mg by mouth daily.     gabapentin (NEURONTIN) 300 MG capsule Take 300 mg by mouth at bedtime.     Methoxy PEG-Epoetin Beta (MIRCERA IJ) Inject into the skin.     metoprolol tartrate (LOPRESSOR) 50 MG tablet Take 2 tablets (100 mg total) by mouth 2 (two) times daily. 180 tablet 3   mupirocin ointment (BACTROBAN) 2 % Apply topically.     sucroferric oxyhydroxide (VELPHORO) 500 MG chewable tablet Chew 1,000 mg by mouth See admin instructions. Chew 2 tablets (1000 mg) by mouth once or twice daily with meals     tamsulosin (FLOMAX) 0.4 MG CAPS capsule TAKE 1 CAPSULE BY MOUTH EVERY DAY 30  capsule 0   testosterone cypionate (DEPOTESTOSTERONE CYPIONATE) 200 MG/ML injection INJECT 1 ML (200 MG TOTAL) INTO THE MUSCLE EVERY 14 (FOURTEEN) DAYS. MUST SCHEDULE ANNUAL EXAM (Patient taking differently: Inject 100 mg into the muscle every Saturday. MUST SCHEDULE ANNUAL EXAM) 2 mL 0   No current facility-administered medications for this encounter.    No Known Allergies  Social History   Socioeconomic History   Marital status: Divorced    Spouse name: Not on file   Number of children: 2   Years of education: Not on file   Highest education level: Not on file  Occupational History   Occupation: Service Tech    Comment: Charity fundraiser  Tobacco Use   Smoking status: Never   Smokeless tobacco: Never  Vaping Use   Vaping Use: Never used  Substance and Sexual Activity   Alcohol use: No    Alcohol/week: 0.0 standard drinks   Drug use: No   Sexual activity: Yes  Other Topics Concern   Not on file  Social History Narrative   Divorced, primary custody of son; Charity fundraiser; gets regular exercise.       Pt signed designated party release and gives no access to medical records. Can leave msg on cell [phone].    Social Determinants of Health   Financial Resource Strain: Low Risk    Difficulty of Paying Living Expenses: Not hard at all  Food Insecurity: No Food Insecurity   Worried About Charity fundraiser in the Last Year: Never true   Arboriculturist in the Last Year: Never true  Transportation Needs: Not on file  Physical Activity: Not on file  Stress: Not on file  Social Connections: Not on file  Intimate Partner Violence: Not on file     ROS- All systems are reviewed and negative except as per the HPI above.  Physical Exam: Vitals:   11/29/21 1500  BP: (!) 144/98  Pulse: (!) 113  Weight: (!) 152.1 kg  Height: 6\' 5"  (1.956 m)    GEN- The patient is a well appearing obese male, alert and oriented x 3 today.   HEENT-head normocephalic, atraumatic,  sclera clear, conjunctiva pink, hearing intact, trachea midline. Lungs- Clear to ausculation bilaterally, normal work of breathing Heart- irregular rate and rhythm, no murmurs, rubs or gallops  GI- soft, NT, ND, + BS Extremities- no clubbing, cyanosis, or edema MS- no significant deformity or atrophy Skin- no rash or lesion Psych- euthymic mood, full affect Neuro- strength and sensation are intact   Wt Readings from Last 3 Encounters:  11/29/21 (!) 152.1 kg  11/22/21 (!) 153.7 kg  10/22/21 (!) 149 kg    EKG today demonstrates  Afib Vent. rate  113 BPM PR interval * ms QRS duration 90 ms QT/QTcB 306/419 ms  Echo 10/23/21 demonstrated   1. Left ventricular ejection fraction, by estimation, is 55 to 60%. The  left ventricle has normal function. The left ventricle has no regional  wall motion abnormalities. The left ventricular internal cavity size was mildly dilated. There is mild left ventricular hypertrophy. Left ventricular diastolic parameters were normal.   2. Right ventricular systolic function is normal. The right ventricular  size is mildly enlarged. There is moderately elevated pulmonary artery systolic pressure. The estimated right ventricular systolic pressure is 64.4 mmHg.   3. Left atrial size was mildly dilated.   4. The aortic valve is tricuspid. Aortic valve regurgitation is not  visualized. Aortic valve sclerosis/calcification is present, without any  evidence of aortic stenosis.   5. The inferior vena cava is dilated in size with <50% respiratory  variability, suggesting right atrial pressure of 15 mmHg.   6. The mitral valve is abnormal. Moderate leaflet thickening. Moderate mitral valve regurgitation. MR jet is not well visualized on long axis images, but appears more significant on MV short axis image (image 17). Does have LA/LV dilatation. Consider TEE vs cardiac MRI for further evaluation   Epic records are reviewed at length today  CHA2DS2-VASc Score = 1   The patient's score is based upon: CHF History: 0 HTN History: 1 Diabetes History: 0 Stroke History: 0 Vascular Disease History: 0 Age Score: 0 Gender Score: 0       ASSESSMENT AND PLAN: 1. Persistent atrial fibrillation  The patient's CHA2DS2-VASc score is 1, indicating a 0.6% annual risk of stroke.   Suspected related to severe OSA. Will arrange for DCCV.  Continue Lopressor to 100 mg BID His rhythm control options are limited with ESRD.  Continue amiodarone 200 mg BID Increase Eliquis 5 mg BID (D/w dosing with EP, weight >60 kg age < 48)  2. HTN Elevated today, will reassess once back in SR.  3. Obesity Body mass index is 39.77 kg/m. Lifestyle modification was discussed and encouraged including regular physical activity and weight reduction.  4. Obstructive sleep apnea Severe OSA Patient scheduled for CPAP titration on 12/14/21.  5. ESRD Followed by Dr Moshe Cipro   6. Valvular heart disease Moderate MR   Follow up in the AF clinic post DCCV. Will also refer to EP to establish care.    [facility] [address] [place], [place] [address] [phone] 11/29/2021 3:18 PM

## 2021-11-29 NOTE — Patient Instructions (Signed)
Cardioversion scheduled for Thursday, March 9th  -come to clinic for labs at [place] at the [facility] and go to admitting at [facility] not eat or drink anything after midnight the night prior to your procedure.  - Take all your morning medication (except diabetic medications) with a sip of water prior to arrival.  - You will not be able to drive home after your procedure.  - Do NOT miss any doses of your blood thinner - if you should miss a dose please notify our office immediately.  - If you feel as if you go back into normal rhythm prior to scheduled cardioversion, please notify our office immediately. If your procedure is canceled in the cardioversion suite you will be charged a cancellation fee.

## 2021-11-29 NOTE — H&P (View-Only) (Signed)
Primary Care Physician: Michela Pitcher, NP Primary Cardiologist: Dr Johnsie Cancel Primary Electrophysiologist: none Referring Physician: Dr Eunice Blase Eric Douglas is a 51 y.o. male with a history of HTN, ESRD, atrial fibrillation who presents for follow up in the [facility]. The patient was initially diagnosed with atrial fibrillation 10/22/21 after presenting to the ED with chest discomfort. He spontaneously converted back to SR. He was placed on a 30 day event monitor to evaluate arrhythmia burden. The monitor detected afib on 10/30/21 and he was started on Eliquis for a CHADS2VASC score of 1. Patient reports that since 11/19/21 he has had near constant tachypalpitations and fatigue. He is pending a CPAP titration.   On follow up today, patient remains in afib. His heart rate is improved but still elevated. He feels fatigued and SOB like "he is running sprints." He denies any bleeding issues on anticoagulation. His weight is down about 3 lbs. He does have some abdominal fullness.   Today, he denies symptoms of palpitations, chest pain, PND, lower extremity edema, dizziness, presyncope, syncope, bleeding, or neurologic sequela. The patient is tolerating medications without difficulties and is otherwise without complaint today.    Atrial Fibrillation Risk Factors:  he does have symptoms or diagnosis of sleep apnea. he is pending CPAP titration  he does not have a history of rheumatic fever.   he has a BMI of Body mass index is 39.77 kg/m.Marland Kitchen Filed Weights   11/29/21 1500  Weight: (!) 152.1 kg    Family History  Problem Relation Age of Onset   Cancer Paternal Grandfather 37       brain cancer   Hypertension Other        GP   Stroke Other    Diabetes Other        DM - GP    Heart disease Neg Hx      Atrial Fibrillation Management history:  Previous antiarrhythmic drugs: amiodarone  Previous cardioversions: none Previous ablations: none CHADS2VASC  score: 1 Anticoagulation history: Eliquis   Past Medical History:  Diagnosis Date   Fibula fracture    ORIF 1990   Gout    non crystal proven    History of chickenpox    History of kidney disease    HTN (hypertension)    Nephropathy    Ig A; dx 2002. micropscopic hematuria and proteinuria   Tibia fracture    ORIF 1990   Varicocele    L testes   Venous incompetence    R leg   Past Surgical History:  Procedure Laterality Date   APPENDECTOMY  1982   AV FISTULA PLACEMENT Left 06/27/2017   Procedure: LEFT ARM ARTERIOVENOUS (AV) FISTULA CREATION;  Surgeon: Waynetta Sandy, MD;  Location: [facility];  Service: Vascular;  Laterality: Left;   stab phlebectomy Left 12/17/2016   stab phlebectomy >20 incisions (left leg) by Tinnie Gens MD     Current Outpatient Medications  Medication Sig Dispense Refill   allopurinol (ZYLOPRIM) 100 MG tablet Take 100 mg by mouth every morning.  2   amiodarone (PACERONE) 200 MG tablet Take 1 tablet (200 mg total) by mouth 2 (two) times daily. 60 tablet 6   apixaban (ELIQUIS) 2.5 MG TABS tablet Take 1 tablet (2.5 mg total) by mouth 2 (two) times daily. 60 tablet 11   calcitRIOL (ROCALTROL) 0.25 MCG capsule Take 0.5 mcg by mouth every morning.     Calcium Polycarbophil (FIBERCON PO) Take 2 tablets by  mouth every morning.     cinacalcet (SENSIPAR) 90 MG tablet Take 90 mg by mouth daily.     gabapentin (NEURONTIN) 300 MG capsule Take 300 mg by mouth at bedtime.     Methoxy PEG-Epoetin Beta (MIRCERA IJ) Inject into the skin.     metoprolol tartrate (LOPRESSOR) 50 MG tablet Take 2 tablets (100 mg total) by mouth 2 (two) times daily. 180 tablet 3   mupirocin ointment (BACTROBAN) 2 % Apply topically.     sucroferric oxyhydroxide (VELPHORO) 500 MG chewable tablet Chew 1,000 mg by mouth See admin instructions. Chew 2 tablets (1000 mg) by mouth once or twice daily with meals     tamsulosin (FLOMAX) 0.4 MG CAPS capsule TAKE 1 CAPSULE BY MOUTH EVERY DAY 30  capsule 0   testosterone cypionate (DEPOTESTOSTERONE CYPIONATE) 200 MG/ML injection INJECT 1 ML (200 MG TOTAL) INTO THE MUSCLE EVERY 14 (FOURTEEN) DAYS. MUST SCHEDULE ANNUAL EXAM (Patient taking differently: Inject 100 mg into the muscle every Saturday. MUST SCHEDULE ANNUAL EXAM) 2 mL 0   No current facility-administered medications for this encounter.    No Known Allergies  Social History   Socioeconomic History   Marital status: Divorced    Spouse name: Not on file   Number of children: 2   Years of education: Not on file   Highest education level: Not on file  Occupational History   Occupation: Service Tech    Comment: Charity fundraiser  Tobacco Use   Smoking status: Never   Smokeless tobacco: Never  Vaping Use   Vaping Use: Never used  Substance and Sexual Activity   Alcohol use: No    Alcohol/week: 0.0 standard drinks   Drug use: No   Sexual activity: Yes  Other Topics Concern   Not on file  Social History Narrative   Divorced, primary custody of son; Charity fundraiser; gets regular exercise.       Pt signed designated party release and gives no access to medical records. Can leave msg on cell [phone].    Social Determinants of Health   Financial Resource Strain: Low Risk    Difficulty of Paying Living Expenses: Not hard at all  Food Insecurity: No Food Insecurity   Worried About Charity fundraiser in the Last Year: Never true   Arboriculturist in the Last Year: Never true  Transportation Needs: Not on file  Physical Activity: Not on file  Stress: Not on file  Social Connections: Not on file  Intimate Partner Violence: Not on file     ROS- All systems are reviewed and negative except as per the HPI above.  Physical Exam: Vitals:   11/29/21 1500  BP: (!) 144/98  Pulse: (!) 113  Weight: (!) 152.1 kg  Height: 6\' 5"  (1.956 m)    GEN- The patient is a well appearing obese male, alert and oriented x 3 today.   HEENT-head normocephalic, atraumatic,  sclera clear, conjunctiva pink, hearing intact, trachea midline. Lungs- Clear to ausculation bilaterally, normal work of breathing Heart- irregular rate and rhythm, no murmurs, rubs or gallops  GI- soft, NT, ND, + BS Extremities- no clubbing, cyanosis, or edema MS- no significant deformity or atrophy Skin- no rash or lesion Psych- euthymic mood, full affect Neuro- strength and sensation are intact   Wt Readings from Last 3 Encounters:  11/29/21 (!) 152.1 kg  11/22/21 (!) 153.7 kg  10/22/21 (!) 149 kg    EKG today demonstrates  Afib Vent. rate  113 BPM PR interval * ms QRS duration 90 ms QT/QTcB 306/419 ms  Echo 10/23/21 demonstrated   1. Left ventricular ejection fraction, by estimation, is 55 to 60%. The  left ventricle has normal function. The left ventricle has no regional  wall motion abnormalities. The left ventricular internal cavity size was mildly dilated. There is mild left ventricular hypertrophy. Left ventricular diastolic parameters were normal.   2. Right ventricular systolic function is normal. The right ventricular  size is mildly enlarged. There is moderately elevated pulmonary artery systolic pressure. The estimated right ventricular systolic pressure is 09.7 mmHg.   3. Left atrial size was mildly dilated.   4. The aortic valve is tricuspid. Aortic valve regurgitation is not  visualized. Aortic valve sclerosis/calcification is present, without any  evidence of aortic stenosis.   5. The inferior vena cava is dilated in size with <50% respiratory  variability, suggesting right atrial pressure of 15 mmHg.   6. The mitral valve is abnormal. Moderate leaflet thickening. Moderate mitral valve regurgitation. MR jet is not well visualized on long axis images, but appears more significant on MV short axis image (image 17). Does have LA/LV dilatation. Consider TEE vs cardiac MRI for further evaluation   Epic records are reviewed at length today  CHA2DS2-VASc Score = 1   The patient's score is based upon: CHF History: 0 HTN History: 1 Diabetes History: 0 Stroke History: 0 Vascular Disease History: 0 Age Score: 0 Gender Score: 0       ASSESSMENT AND PLAN: 1. Persistent atrial fibrillation  The patient's CHA2DS2-VASc score is 1, indicating a 0.6% annual risk of stroke.   Suspected related to severe OSA. Will arrange for DCCV.  Continue Lopressor to 100 mg BID His rhythm control options are limited with ESRD.  Continue amiodarone 200 mg BID Increase Eliquis 5 mg BID (D/w dosing with EP, weight >60 kg age < 24)  2. HTN Elevated today, will reassess once back in SR.  3. Obesity Body mass index is 39.77 kg/m. Lifestyle modification was discussed and encouraged including regular physical activity and weight reduction.  4. Obstructive sleep apnea Severe OSA Patient scheduled for CPAP titration on 12/14/21.  5. ESRD Followed by Dr Moshe Cipro   6. Valvular heart disease Moderate MR   Follow up in the AF clinic post DCCV. Will also refer to EP to establish care.    [facility] [address] [place], [place] [address] [phone] 11/29/2021 3:18 PM

## 2021-11-30 ENCOUNTER — Other Ambulatory Visit: Payer: Self-pay | Admitting: Physician Assistant

## 2021-11-30 ENCOUNTER — Other Ambulatory Visit (HOSPITAL_COMMUNITY): Payer: Self-pay | Admitting: *Deleted

## 2021-11-30 DIAGNOSIS — N186 End stage renal disease: Secondary | ICD-10-CM | POA: Diagnosis not present

## 2021-11-30 DIAGNOSIS — R002 Palpitations: Secondary | ICD-10-CM

## 2021-11-30 DIAGNOSIS — D509 Iron deficiency anemia, unspecified: Secondary | ICD-10-CM | POA: Diagnosis not present

## 2021-11-30 DIAGNOSIS — I480 Paroxysmal atrial fibrillation: Secondary | ICD-10-CM

## 2021-11-30 DIAGNOSIS — D631 Anemia in chronic kidney disease: Secondary | ICD-10-CM | POA: Diagnosis not present

## 2021-11-30 DIAGNOSIS — N2581 Secondary hyperparathyroidism of renal origin: Secondary | ICD-10-CM | POA: Diagnosis not present

## 2021-11-30 DIAGNOSIS — D6869 Other thrombophilia: Secondary | ICD-10-CM | POA: Insufficient documentation

## 2021-11-30 DIAGNOSIS — E440 Moderate protein-calorie malnutrition: Secondary | ICD-10-CM | POA: Diagnosis not present

## 2021-11-30 DIAGNOSIS — Z992 Dependence on renal dialysis: Secondary | ICD-10-CM | POA: Diagnosis not present

## 2021-11-30 MED ORDER — APIXABAN 5 MG PO TABS: 5.0000 mg | ORAL_TABLET | Freq: Two times a day (BID) | ORAL | 3 refills | Status: DC

## 2021-12-03 DIAGNOSIS — Z992 Dependence on renal dialysis: Secondary | ICD-10-CM | POA: Diagnosis not present

## 2021-12-03 DIAGNOSIS — N186 End stage renal disease: Secondary | ICD-10-CM | POA: Diagnosis not present

## 2021-12-03 DIAGNOSIS — D631 Anemia in chronic kidney disease: Secondary | ICD-10-CM | POA: Diagnosis not present

## 2021-12-03 DIAGNOSIS — E440 Moderate protein-calorie malnutrition: Secondary | ICD-10-CM | POA: Diagnosis not present

## 2021-12-03 DIAGNOSIS — N2581 Secondary hyperparathyroidism of renal origin: Secondary | ICD-10-CM | POA: Diagnosis not present

## 2021-12-03 DIAGNOSIS — D509 Iron deficiency anemia, unspecified: Secondary | ICD-10-CM | POA: Diagnosis not present

## 2021-12-04 DIAGNOSIS — D509 Iron deficiency anemia, unspecified: Secondary | ICD-10-CM | POA: Diagnosis not present

## 2021-12-04 DIAGNOSIS — D631 Anemia in chronic kidney disease: Secondary | ICD-10-CM | POA: Diagnosis not present

## 2021-12-04 DIAGNOSIS — E440 Moderate protein-calorie malnutrition: Secondary | ICD-10-CM | POA: Diagnosis not present

## 2021-12-04 DIAGNOSIS — Z992 Dependence on renal dialysis: Secondary | ICD-10-CM | POA: Diagnosis not present

## 2021-12-04 DIAGNOSIS — N186 End stage renal disease: Secondary | ICD-10-CM | POA: Diagnosis not present

## 2021-12-04 DIAGNOSIS — N2581 Secondary hyperparathyroidism of renal origin: Secondary | ICD-10-CM | POA: Diagnosis not present

## 2021-12-06 ENCOUNTER — Telehealth (HOSPITAL_COMMUNITY): Payer: Self-pay | Admitting: *Deleted

## 2021-12-06 DIAGNOSIS — N2581 Secondary hyperparathyroidism of renal origin: Secondary | ICD-10-CM | POA: Diagnosis not present

## 2021-12-06 DIAGNOSIS — D631 Anemia in chronic kidney disease: Secondary | ICD-10-CM | POA: Diagnosis not present

## 2021-12-06 DIAGNOSIS — N186 End stage renal disease: Secondary | ICD-10-CM | POA: Diagnosis not present

## 2021-12-06 DIAGNOSIS — Z992 Dependence on renal dialysis: Secondary | ICD-10-CM | POA: Diagnosis not present

## 2021-12-06 DIAGNOSIS — E440 Moderate protein-calorie malnutrition: Secondary | ICD-10-CM | POA: Diagnosis not present

## 2021-12-06 DIAGNOSIS — D509 Iron deficiency anemia, unspecified: Secondary | ICD-10-CM | POA: Diagnosis not present

## 2021-12-06 MED ORDER — METOPROLOL TARTRATE 50 MG PO TABS
50.0000 mg | ORAL_TABLET | Freq: Two times a day (BID) | ORAL | 3 refills | Status: DC
Start: 2021-12-06 — End: 2021-12-12

## 2021-12-06 NOTE — Telephone Encounter (Signed)
Patient called in stating his heart rates are in the 50s and becoming dizzy. Discussed with Adline Peals PA will decrease metoprolol to 50mg  twice a day. Pt will notify if symptoms continue for further medication changes. Pt in agreement.

## 2021-12-07 DIAGNOSIS — D509 Iron deficiency anemia, unspecified: Secondary | ICD-10-CM | POA: Diagnosis not present

## 2021-12-07 DIAGNOSIS — D631 Anemia in chronic kidney disease: Secondary | ICD-10-CM | POA: Diagnosis not present

## 2021-12-07 DIAGNOSIS — E440 Moderate protein-calorie malnutrition: Secondary | ICD-10-CM | POA: Diagnosis not present

## 2021-12-07 DIAGNOSIS — Z992 Dependence on renal dialysis: Secondary | ICD-10-CM | POA: Diagnosis not present

## 2021-12-07 DIAGNOSIS — N186 End stage renal disease: Secondary | ICD-10-CM | POA: Diagnosis not present

## 2021-12-07 DIAGNOSIS — N2581 Secondary hyperparathyroidism of renal origin: Secondary | ICD-10-CM | POA: Diagnosis not present

## 2021-12-08 DIAGNOSIS — E440 Moderate protein-calorie malnutrition: Secondary | ICD-10-CM | POA: Diagnosis not present

## 2021-12-08 DIAGNOSIS — D509 Iron deficiency anemia, unspecified: Secondary | ICD-10-CM | POA: Diagnosis not present

## 2021-12-08 DIAGNOSIS — D631 Anemia in chronic kidney disease: Secondary | ICD-10-CM | POA: Diagnosis not present

## 2021-12-08 DIAGNOSIS — N2581 Secondary hyperparathyroidism of renal origin: Secondary | ICD-10-CM | POA: Diagnosis not present

## 2021-12-08 DIAGNOSIS — N186 End stage renal disease: Secondary | ICD-10-CM | POA: Diagnosis not present

## 2021-12-08 DIAGNOSIS — Z992 Dependence on renal dialysis: Secondary | ICD-10-CM | POA: Diagnosis not present

## 2021-12-10 DIAGNOSIS — N186 End stage renal disease: Secondary | ICD-10-CM | POA: Diagnosis not present

## 2021-12-10 DIAGNOSIS — E440 Moderate protein-calorie malnutrition: Secondary | ICD-10-CM | POA: Diagnosis not present

## 2021-12-10 DIAGNOSIS — N2581 Secondary hyperparathyroidism of renal origin: Secondary | ICD-10-CM | POA: Diagnosis not present

## 2021-12-10 DIAGNOSIS — D509 Iron deficiency anemia, unspecified: Secondary | ICD-10-CM | POA: Diagnosis not present

## 2021-12-10 DIAGNOSIS — D631 Anemia in chronic kidney disease: Secondary | ICD-10-CM | POA: Diagnosis not present

## 2021-12-10 DIAGNOSIS — Z992 Dependence on renal dialysis: Secondary | ICD-10-CM | POA: Diagnosis not present

## 2021-12-11 DIAGNOSIS — D509 Iron deficiency anemia, unspecified: Secondary | ICD-10-CM | POA: Diagnosis not present

## 2021-12-11 DIAGNOSIS — N186 End stage renal disease: Secondary | ICD-10-CM | POA: Diagnosis not present

## 2021-12-11 DIAGNOSIS — N2581 Secondary hyperparathyroidism of renal origin: Secondary | ICD-10-CM | POA: Diagnosis not present

## 2021-12-11 DIAGNOSIS — E440 Moderate protein-calorie malnutrition: Secondary | ICD-10-CM | POA: Diagnosis not present

## 2021-12-11 DIAGNOSIS — Z992 Dependence on renal dialysis: Secondary | ICD-10-CM | POA: Diagnosis not present

## 2021-12-11 DIAGNOSIS — D631 Anemia in chronic kidney disease: Secondary | ICD-10-CM | POA: Diagnosis not present

## 2021-12-12 ENCOUNTER — Telehealth (HOSPITAL_COMMUNITY): Payer: Self-pay | Admitting: *Deleted

## 2021-12-12 ENCOUNTER — Encounter (HOSPITAL_BASED_OUTPATIENT_CLINIC_OR_DEPARTMENT_OTHER): Payer: Medicare Other | Admitting: Pulmonary Disease

## 2021-12-12 DIAGNOSIS — N186 End stage renal disease: Secondary | ICD-10-CM

## 2021-12-12 DIAGNOSIS — N400 Enlarged prostate without lower urinary tract symptoms: Secondary | ICD-10-CM | POA: Diagnosis not present

## 2021-12-12 DIAGNOSIS — I4891 Unspecified atrial fibrillation: Secondary | ICD-10-CM | POA: Diagnosis not present

## 2021-12-12 DIAGNOSIS — Z992 Dependence on renal dialysis: Secondary | ICD-10-CM

## 2021-12-12 DIAGNOSIS — I120 Hypertensive chronic kidney disease with stage 5 chronic kidney disease or end stage renal disease: Secondary | ICD-10-CM

## 2021-12-12 DIAGNOSIS — N028 Recurrent and persistent hematuria with other morphologic changes: Secondary | ICD-10-CM | POA: Diagnosis not present

## 2021-12-12 MED ORDER — METOPROLOL TARTRATE 50 MG PO TABS: 75.0000 mg | ORAL_TABLET | Freq: Two times a day (BID) | ORAL | 3 refills | Status: DC

## 2021-12-12 NOTE — Telephone Encounter (Signed)
Patient called in stating his heart rates are now 107-117 BP 160/110 discussed with Adline Peals PA will increase metoprolol to 75mg  BID. He will let us know if further adjustment needed. Pt in agreement.

## 2021-12-13 ENCOUNTER — Encounter (HOSPITAL_COMMUNITY): Payer: Self-pay | Admitting: Internal Medicine

## 2021-12-13 DIAGNOSIS — N186 End stage renal disease: Secondary | ICD-10-CM | POA: Diagnosis not present

## 2021-12-13 DIAGNOSIS — D631 Anemia in chronic kidney disease: Secondary | ICD-10-CM | POA: Diagnosis not present

## 2021-12-13 DIAGNOSIS — D509 Iron deficiency anemia, unspecified: Secondary | ICD-10-CM | POA: Diagnosis not present

## 2021-12-13 DIAGNOSIS — N2589 Other disorders resulting from impaired renal tubular function: Secondary | ICD-10-CM | POA: Diagnosis not present

## 2021-12-13 DIAGNOSIS — I120 Hypertensive chronic kidney disease with stage 5 chronic kidney disease or end stage renal disease: Secondary | ICD-10-CM | POA: Diagnosis not present

## 2021-12-13 DIAGNOSIS — N028 Recurrent and persistent hematuria with other morphologic changes: Secondary | ICD-10-CM | POA: Diagnosis not present

## 2021-12-13 DIAGNOSIS — Z992 Dependence on renal dialysis: Secondary | ICD-10-CM | POA: Diagnosis not present

## 2021-12-13 DIAGNOSIS — N2581 Secondary hyperparathyroidism of renal origin: Secondary | ICD-10-CM | POA: Diagnosis not present

## 2021-12-13 NOTE — Progress Notes (Signed)
Attempted to obtain medical history via telephone, unable to reach at this time. I left a voicemail to return pre surgical testing department's phone call.  

## 2021-12-14 ENCOUNTER — Ambulatory Visit (HOSPITAL_BASED_OUTPATIENT_CLINIC_OR_DEPARTMENT_OTHER): Payer: Medicare Other | Attending: Pulmonary Disease | Admitting: Pulmonary Disease

## 2021-12-14 ENCOUNTER — Other Ambulatory Visit: Payer: Self-pay

## 2021-12-14 ENCOUNTER — Ambulatory Visit (HOSPITAL_COMMUNITY): Payer: Medicare Other | Admitting: Physician Assistant

## 2021-12-14 DIAGNOSIS — G4733 Obstructive sleep apnea (adult) (pediatric): Secondary | ICD-10-CM | POA: Diagnosis not present

## 2021-12-14 DIAGNOSIS — N186 End stage renal disease: Secondary | ICD-10-CM | POA: Diagnosis not present

## 2021-12-14 DIAGNOSIS — Z992 Dependence on renal dialysis: Secondary | ICD-10-CM | POA: Diagnosis not present

## 2021-12-14 DIAGNOSIS — N2581 Secondary hyperparathyroidism of renal origin: Secondary | ICD-10-CM | POA: Diagnosis not present

## 2021-12-14 DIAGNOSIS — D631 Anemia in chronic kidney disease: Secondary | ICD-10-CM | POA: Diagnosis not present

## 2021-12-14 DIAGNOSIS — N2589 Other disorders resulting from impaired renal tubular function: Secondary | ICD-10-CM | POA: Diagnosis not present

## 2021-12-14 DIAGNOSIS — D509 Iron deficiency anemia, unspecified: Secondary | ICD-10-CM | POA: Diagnosis not present

## 2021-12-15 DIAGNOSIS — N2581 Secondary hyperparathyroidism of renal origin: Secondary | ICD-10-CM | POA: Diagnosis not present

## 2021-12-15 DIAGNOSIS — D509 Iron deficiency anemia, unspecified: Secondary | ICD-10-CM | POA: Diagnosis not present

## 2021-12-15 DIAGNOSIS — N186 End stage renal disease: Secondary | ICD-10-CM | POA: Diagnosis not present

## 2021-12-15 DIAGNOSIS — D631 Anemia in chronic kidney disease: Secondary | ICD-10-CM | POA: Diagnosis not present

## 2021-12-15 DIAGNOSIS — N2589 Other disorders resulting from impaired renal tubular function: Secondary | ICD-10-CM | POA: Diagnosis not present

## 2021-12-15 DIAGNOSIS — Z992 Dependence on renal dialysis: Secondary | ICD-10-CM | POA: Diagnosis not present

## 2021-12-17 DIAGNOSIS — N186 End stage renal disease: Secondary | ICD-10-CM | POA: Diagnosis not present

## 2021-12-17 DIAGNOSIS — Z992 Dependence on renal dialysis: Secondary | ICD-10-CM | POA: Diagnosis not present

## 2021-12-17 DIAGNOSIS — D509 Iron deficiency anemia, unspecified: Secondary | ICD-10-CM | POA: Diagnosis not present

## 2021-12-17 DIAGNOSIS — D631 Anemia in chronic kidney disease: Secondary | ICD-10-CM | POA: Diagnosis not present

## 2021-12-17 DIAGNOSIS — N2589 Other disorders resulting from impaired renal tubular function: Secondary | ICD-10-CM | POA: Diagnosis not present

## 2021-12-17 DIAGNOSIS — N2581 Secondary hyperparathyroidism of renal origin: Secondary | ICD-10-CM | POA: Diagnosis not present

## 2021-12-18 DIAGNOSIS — N2581 Secondary hyperparathyroidism of renal origin: Secondary | ICD-10-CM | POA: Diagnosis not present

## 2021-12-18 DIAGNOSIS — D631 Anemia in chronic kidney disease: Secondary | ICD-10-CM | POA: Diagnosis not present

## 2021-12-18 DIAGNOSIS — Z992 Dependence on renal dialysis: Secondary | ICD-10-CM | POA: Diagnosis not present

## 2021-12-18 DIAGNOSIS — D509 Iron deficiency anemia, unspecified: Secondary | ICD-10-CM | POA: Diagnosis not present

## 2021-12-18 DIAGNOSIS — N186 End stage renal disease: Secondary | ICD-10-CM | POA: Diagnosis not present

## 2021-12-18 DIAGNOSIS — N2589 Other disorders resulting from impaired renal tubular function: Secondary | ICD-10-CM | POA: Diagnosis not present

## 2021-12-20 DIAGNOSIS — N2581 Secondary hyperparathyroidism of renal origin: Secondary | ICD-10-CM | POA: Diagnosis not present

## 2021-12-20 DIAGNOSIS — N2589 Other disorders resulting from impaired renal tubular function: Secondary | ICD-10-CM | POA: Diagnosis not present

## 2021-12-20 DIAGNOSIS — D509 Iron deficiency anemia, unspecified: Secondary | ICD-10-CM | POA: Diagnosis not present

## 2021-12-20 DIAGNOSIS — D631 Anemia in chronic kidney disease: Secondary | ICD-10-CM | POA: Diagnosis not present

## 2021-12-20 DIAGNOSIS — Z992 Dependence on renal dialysis: Secondary | ICD-10-CM | POA: Diagnosis not present

## 2021-12-20 DIAGNOSIS — N186 End stage renal disease: Secondary | ICD-10-CM | POA: Diagnosis not present

## 2021-12-21 ENCOUNTER — Ambulatory Visit (HOSPITAL_COMMUNITY)
Admission: RE | Admit: 2021-12-21 | Discharge: 2021-12-21 | Disposition: A | Discharge status: Home / Self Care | Payer: Medicare Other | Attending: Cardiovascular Disease | Admitting: Cardiovascular Disease

## 2021-12-21 ENCOUNTER — Other Ambulatory Visit: Payer: Self-pay

## 2021-12-21 ENCOUNTER — Ambulatory Visit (HOSPITAL_COMMUNITY)
Admission: RE | Admit: 2021-12-21 | Discharge: 2021-12-21 | Disposition: A | Discharge status: Home / Self Care | Payer: Medicare Other | Source: Ambulatory Visit | Attending: Physician Assistant | Admitting: Physician Assistant

## 2021-12-21 ENCOUNTER — Ambulatory Visit (HOSPITAL_COMMUNITY): Payer: Medicare Other | Admitting: Anesthesiology

## 2021-12-21 ENCOUNTER — Encounter (HOSPITAL_COMMUNITY)
Admission: RE | Disposition: A | Discharge status: Home / Self Care | Payer: Self-pay | Source: Home / Self Care | Attending: Cardiovascular Disease

## 2021-12-21 ENCOUNTER — Encounter (HOSPITAL_COMMUNITY): Payer: Self-pay | Admitting: Cardiovascular Disease

## 2021-12-21 ENCOUNTER — Ambulatory Visit (HOSPITAL_BASED_OUTPATIENT_CLINIC_OR_DEPARTMENT_OTHER): Payer: Medicare Other | Admitting: Anesthesiology

## 2021-12-21 DIAGNOSIS — I483 Typical atrial flutter: Secondary | ICD-10-CM

## 2021-12-21 DIAGNOSIS — N186 End stage renal disease: Secondary | ICD-10-CM

## 2021-12-21 DIAGNOSIS — M1040 Other secondary gout, unspecified site: Secondary | ICD-10-CM | POA: Diagnosis not present

## 2021-12-21 DIAGNOSIS — I120 Hypertensive chronic kidney disease with stage 5 chronic kidney disease or end stage renal disease: Secondary | ICD-10-CM

## 2021-12-21 DIAGNOSIS — E669 Obesity, unspecified: Secondary | ICD-10-CM | POA: Diagnosis not present

## 2021-12-21 DIAGNOSIS — G4733 Obstructive sleep apnea (adult) (pediatric): Secondary | ICD-10-CM | POA: Diagnosis not present

## 2021-12-21 DIAGNOSIS — Z7901 Long term (current) use of anticoagulants: Secondary | ICD-10-CM | POA: Diagnosis not present

## 2021-12-21 DIAGNOSIS — I4892 Unspecified atrial flutter: Secondary | ICD-10-CM | POA: Insufficient documentation

## 2021-12-21 DIAGNOSIS — Z992 Dependence on renal dialysis: Secondary | ICD-10-CM | POA: Insufficient documentation

## 2021-12-21 DIAGNOSIS — Z6837 Body mass index (BMI) 37.0-37.9, adult: Secondary | ICD-10-CM | POA: Insufficient documentation

## 2021-12-21 DIAGNOSIS — I4819 Other persistent atrial fibrillation: Secondary | ICD-10-CM | POA: Diagnosis not present

## 2021-12-21 DIAGNOSIS — I739 Peripheral vascular disease, unspecified: Secondary | ICD-10-CM | POA: Insufficient documentation

## 2021-12-21 DIAGNOSIS — D509 Iron deficiency anemia, unspecified: Secondary | ICD-10-CM | POA: Diagnosis not present

## 2021-12-21 DIAGNOSIS — N2589 Other disorders resulting from impaired renal tubular function: Secondary | ICD-10-CM | POA: Diagnosis not present

## 2021-12-21 DIAGNOSIS — Z79899 Other long term (current) drug therapy: Secondary | ICD-10-CM | POA: Insufficient documentation

## 2021-12-21 DIAGNOSIS — I340 Nonrheumatic mitral (valve) insufficiency: Secondary | ICD-10-CM | POA: Insufficient documentation

## 2021-12-21 DIAGNOSIS — I4891 Unspecified atrial fibrillation: Secondary | ICD-10-CM

## 2021-12-21 DIAGNOSIS — D631 Anemia in chronic kidney disease: Secondary | ICD-10-CM | POA: Diagnosis not present

## 2021-12-21 DIAGNOSIS — N2581 Secondary hyperparathyroidism of renal origin: Secondary | ICD-10-CM | POA: Diagnosis not present

## 2021-12-21 HISTORY — PX: CARDIOVERSION: SHX1299

## 2021-12-21 LAB — CBC
HCT: 32.6 % — ABNORMAL LOW (ref 39.0–52.0)
Hemoglobin: 10.6 g/dL — ABNORMAL LOW (ref 13.0–17.0)
MCH: 32.7 pg (ref 26.0–34.0)
MCHC: 32.5 g/dL (ref 30.0–36.0)
MCV: 100.6 fL — ABNORMAL HIGH (ref 80.0–100.0)
Platelets: 120 10*3/uL — ABNORMAL LOW (ref 150–400)
RBC: 3.24 MIL/uL — ABNORMAL LOW (ref 4.22–5.81)
RDW: 14.1 % (ref 11.5–15.5)
WBC: 4.8 10*3/uL (ref 4.0–10.5)
nRBC: 0 % (ref 0.0–0.2)

## 2021-12-21 LAB — BASIC METABOLIC PANEL
Anion gap: 8 (ref 5–15)
BUN: 39 mg/dL — ABNORMAL HIGH (ref 6–20)
CO2: 26 mmol/L (ref 22–32)
Calcium: 9.1 mg/dL (ref 8.9–10.3)
Chloride: 105 mmol/L (ref 98–111)
Creatinine, Ser: 10.99 mg/dL — ABNORMAL HIGH (ref 0.61–1.24)
GFR, Estimated: 5 mL/min — ABNORMAL LOW (ref >60)
Glucose, Bld: 93 mg/dL (ref 70–99)
Potassium: 6 mmol/L — ABNORMAL HIGH (ref 3.5–5.1)
Sodium: 139 mmol/L (ref 135–145)

## 2021-12-21 LAB — POCT I-STAT, CHEM 8
BUN: 37 mg/dL — ABNORMAL HIGH (ref 6–20)
Calcium, Ion: 1.13 mmol/L — ABNORMAL LOW (ref 1.15–1.40)
Chloride: 104 mmol/L (ref 98–111)
Creatinine, Ser: 12.5 mg/dL — ABNORMAL HIGH (ref 0.61–1.24)
Glucose, Bld: 79 mg/dL (ref 70–99)
HCT: 31 % — ABNORMAL LOW (ref 39.0–52.0)
Hemoglobin: 10.5 g/dL — ABNORMAL LOW (ref 13.0–17.0)
Potassium: 5.7 mmol/L — ABNORMAL HIGH (ref 3.5–5.1)
Sodium: 137 mmol/L (ref 135–145)
TCO2: 25 mmol/L (ref 22–32)

## 2021-12-21 SURGERY — CARDIOVERSION
Anesthesia: General

## 2021-12-21 MED ORDER — PHENYLEPHRINE HCL (PRESSORS) 10 MG/ML IV SOLN
INTRAVENOUS | Status: DC | PRN
  Administered 2021-12-21: 40 ug via INTRAVENOUS

## 2021-12-21 MED ORDER — PROPOFOL 10 MG/ML IV BOLUS
INTRAVENOUS | Status: DC | PRN
  Administered 2021-12-21: 100 mg via INTRAVENOUS

## 2021-12-21 MED ORDER — SODIUM CHLORIDE 0.9 % IV SOLN: INTRAVENOUS | Status: DC

## 2021-12-21 MED ORDER — LIDOCAINE 2% (20 MG/ML) 5 ML SYRINGE
INTRAMUSCULAR | Status: DC | PRN
  Administered 2021-12-21: 100 mg via INTRAVENOUS

## 2021-12-21 NOTE — Op Note (Signed)
Procedure: Electrical Cardioversion ?Indications:  Atrial Fibrillation and Atrial Flutter ? ?Procedure Details: ? ?Consent: Risks of procedure as well as the alternatives and risks of each were explained to the (patient/caregiver).  Consent for procedure obtained. ? ?Time Out: Verified patient identification, verified procedure, site/side was marked, verified correct patient position, special equipment/implants available, medications/allergies/relevent history reviewed, required imaging and test results available.  Performed ? ?Patient placed on cardiac monitor, pulse oximetry, supplemental oxygen as necessary.  ?Sedation given:  propofol 100 mg IV, Dr. Lanetta Inch ?Pacer pads placed anterior and posterior chest. ? ?Cardioverted 1 time(s).  ?Cardioversion with synchronized biphasic 120J shock. ? ?Evaluation: ?Findings: Post procedure EKG shows: NSR ?Complications: None ?Patient did tolerate procedure well. ? ?Time Spent Directly with the Patient: ? ?30 minutes  ? ?Eric Douglas ?12/21/2021, 10:48 AM ? ? ? ? ?

## 2021-12-21 NOTE — Interval H&P Note (Signed)
History and Physical Interval Note: ? ?12/21/2021 ?10:46 AM ? ?Eric Douglas  has presented today for surgery, with the diagnosis of AFIB.  The various methods of treatment have been discussed with the patient and family. After consideration of risks, benefits and other options for treatment, the patient has consented to  Procedure(s): ?CARDIOVERSION (N/A) as a surgical intervention.  The patient's history has been reviewed, patient examined, no change in status, stable for surgery.  I have reviewed the patient's chart and labs.  Questions were answered to the patient's satisfaction.   ? ? ?[name] ? ? ?

## 2021-12-21 NOTE — Anesthesia Procedure Notes (Signed)
Procedure Name: General with mask airway ?Date/Time: 12/21/2021 10:51 AM ?Performed by: Lavell Luster, CRNA ?Pre-anesthesia Checklist: Patient identified, Emergency Drugs available, Suction available, Patient being monitored and Timeout performed ?Patient Re-evaluated:Patient Re-evaluated prior to induction ?Oxygen Delivery Method: Nasal cannula ?Induction Type: IV induction ?Ventilation: Mask ventilation without difficulty ?Placement Confirmation: breath sounds checked- equal and bilateral and positive ETCO2 ?Dental Injury: Teeth and Oropharynx as per pre-operative assessment  ? ? ? ? ?

## 2021-12-21 NOTE — Discharge Instructions (Signed)

## 2021-12-21 NOTE — Anesthesia Preprocedure Evaluation (Addendum)
Anesthesia Evaluation  ?Patient identified by MRN, date of birth, ID band ?Patient awake ? ? ? ?Reviewed: ?Allergy & Precautions, NPO status , Patient's Chart, lab work & pertinent test results, reviewed documented beta blocker date and time  ? ?Airway ?Mallampati: II ? ?TM Distance: >3 FB ?Neck ROM: Full ? ? ? Dental ?no notable dental hx. ?(+) Teeth Intact, Dental Advisory Given ?  ?Pulmonary ?neg pulmonary ROS,  ?  ?Pulmonary exam normal ?breath sounds clear to auscultation ? ? ? ? ? ? Cardiovascular ?hypertension, Pt. on home beta blockers and Pt. on medications ?+ Peripheral Vascular Disease and + DOE  ?Normal cardiovascular exam+ dysrhythmias Atrial Fibrillation + Valvular Problems/Murmurs MR  ?Rhythm:Irregular Rate:Normal ? ?TTE 2023 ?1. Left ventricular ejection fraction, by estimation, is 55 to 60%. The  ?left ventricle has normal function. The left ventricle has no regional  ?wall motion abnormalities. The left ventricular internal cavity size was  ?mildly dilated. There is mild left  ?ventricular hypertrophy. Left ventricular diastolic parameters were  ?normal.  ??2. Right ventricular systolic function is normal. The right ventricular  ?size is mildly enlarged. There is moderately elevated pulmonary artery  ?systolic pressure. The estimated right ventricular systolic pressure is  ?52.8 mmHg.  ??3. Left atrial size was mildly dilated.  ??4. The aortic valve is tricuspid. Aortic valve regurgitation is not  ?visualized. Aortic valve sclerosis/calcification is present, without any  ?evidence of aortic stenosis.  ??5. The inferior vena cava is dilated in size with <50% respiratory  ?variability, suggesting right atrial pressure of 15 mmHg.  ??6. The mitral valve is abnormal. Moderate leaflet thickening. Moderate  ?mitral valve regurgitation. MR jet is not well visualized on long axis  ?images, but appears more significant on MV short axis image (image 17).  ?Does have LA/LV  dilatation. Consider  ?TEE vs cardiac MRI for further evaluation  ?  ?Neuro/Psych ?negative neurological ROS ? negative psych ROS  ? GI/Hepatic ?negative GI ROS, Neg liver ROS,   ?Endo/Other  ?negative endocrine ROSObese BMI 38 ? Renal/GU ?ESRF and DialysisRenal diseaseIgA nephropathy  ?negative genitourinary ?  ?Musculoskeletal ?negative musculoskeletal ROS ?(+)  ? Abdominal ?  ?Peds ? Hematology ? ?(+) Blood dyscrasia (on eliquis), ,   ?Anesthesia Other Findings ? ? Reproductive/Obstetrics ? ?  ? ? ? ? ? ? ? ? ? ? ? ? ? ?  ?  ? ? ? ? ? ? ? ?Anesthesia Physical ?Anesthesia Plan ? ?ASA: 3 ? ?Anesthesia Plan: General  ? ?Post-op Pain Management:   ? ?Induction: Intravenous ? ?PONV Risk Score and Plan: Propofol infusion and Treatment may vary due to age or medical condition ? ?Airway Management Planned: Natural Airway ? ?Additional Equipment:  ? ?Intra-op Plan:  ? ?Post-operative Plan:  ? ?Informed Consent: I have reviewed the patients History and Physical, chart, labs and discussed the procedure including the risks, benefits and alternatives for the proposed anesthesia with the patient or authorized representative who has indicated his/her understanding and acceptance.  ? ? ? ?Dental advisory given ? ?Plan Discussed with: CRNA ? ?Anesthesia Plan Comments:   ? ? ? ? ? ? ?Anesthesia Quick Evaluation ? ?

## 2021-12-21 NOTE — Transfer of Care (Signed)
Immediate Anesthesia Transfer of Care Note ? ?Patient: Eric Douglas ? ?Procedure(s) Performed: CARDIOVERSION ? ?Patient Location: Endoscopy Unit ? ?Anesthesia Type:General ? ?Level of Consciousness: awake, alert  and oriented ? ?Airway & Oxygen Therapy: Patient connected to nasal cannula oxygen ? ?Post-op Assessment: Post -op Vital signs reviewed and stable ? ?Post vital signs: stable ? ?Last Vitals:  ?Vitals Value Taken Time  ?BP    ?Temp    ?Pulse    ?Resp    ?SpO2    ? ? ?Last Pain:  ?Vitals:  ? 12/21/21 1018  ?TempSrc: Temporal  ?PainSc: 0-No pain  ?   ? ?  ? ?Complications: No notable events documented. ?

## 2021-12-21 NOTE — Anesthesia Postprocedure Evaluation (Signed)
Anesthesia Post Note ? ?Patient: Eric Douglas ? ?Procedure(s) Performed: CARDIOVERSION ? ?  ? ?Patient location during evaluation: Endoscopy ?Anesthesia Type: General ?Level of consciousness: awake and alert ?Pain management: pain level controlled ?Vital Signs Assessment: post-procedure vital signs reviewed and stable ?Respiratory status: spontaneous breathing, nonlabored ventilation, respiratory function stable and patient connected to nasal cannula oxygen ?Cardiovascular status: blood pressure returned to baseline and stable ?Postop Assessment: no apparent nausea or vomiting ?Anesthetic complications: no ? ? ?No notable events documented. ? ?Last Vitals:  ?Vitals:  ? 12/21/21 1102 12/21/21 1112  ?BP: (!) 99/55 (!) 102/59  ?Pulse: (!) 57 (!) 57  ?Resp: 14 15  ?Temp:    ?SpO2: 100% 100%  ?  ?Last Pain:  ?Vitals:  ? 12/21/21 1112  ?TempSrc:   ?PainSc: 0-No pain  ? ? ?  ?  ?  ?  ?  ?  ? ?Eric Douglas ? ? ? ? ?

## 2021-12-24 DIAGNOSIS — Z992 Dependence on renal dialysis: Secondary | ICD-10-CM | POA: Diagnosis not present

## 2021-12-24 DIAGNOSIS — N2589 Other disorders resulting from impaired renal tubular function: Secondary | ICD-10-CM | POA: Diagnosis not present

## 2021-12-24 DIAGNOSIS — D631 Anemia in chronic kidney disease: Secondary | ICD-10-CM | POA: Diagnosis not present

## 2021-12-24 DIAGNOSIS — D509 Iron deficiency anemia, unspecified: Secondary | ICD-10-CM | POA: Diagnosis not present

## 2021-12-24 DIAGNOSIS — N2581 Secondary hyperparathyroidism of renal origin: Secondary | ICD-10-CM | POA: Diagnosis not present

## 2021-12-24 DIAGNOSIS — N186 End stage renal disease: Secondary | ICD-10-CM | POA: Diagnosis not present

## 2021-12-25 ENCOUNTER — Telehealth: Payer: Self-pay | Admitting: Pulmonary Disease

## 2021-12-25 DIAGNOSIS — D631 Anemia in chronic kidney disease: Secondary | ICD-10-CM | POA: Diagnosis not present

## 2021-12-25 DIAGNOSIS — N186 End stage renal disease: Secondary | ICD-10-CM | POA: Diagnosis not present

## 2021-12-25 DIAGNOSIS — G4733 Obstructive sleep apnea (adult) (pediatric): Secondary | ICD-10-CM

## 2021-12-25 DIAGNOSIS — N2589 Other disorders resulting from impaired renal tubular function: Secondary | ICD-10-CM | POA: Diagnosis not present

## 2021-12-25 DIAGNOSIS — N2581 Secondary hyperparathyroidism of renal origin: Secondary | ICD-10-CM | POA: Diagnosis not present

## 2021-12-25 DIAGNOSIS — D509 Iron deficiency anemia, unspecified: Secondary | ICD-10-CM | POA: Diagnosis not present

## 2021-12-25 DIAGNOSIS — Z992 Dependence on renal dialysis: Secondary | ICD-10-CM | POA: Diagnosis not present

## 2021-12-25 NOTE — Telephone Encounter (Signed)
Call patient ? ?Sleep study result ? ?Date of study: ?12/14/2020 ? ?Impression: ?Severe obstructive sleep apnea adequately treated with CPAP therapy ? ?Recommendation: ?DME referral ? ?Recommend CPAP therapy for severe obstructive sleep apnea ? ?Trial of CPAP therapy on 15 cm H2O with a Large size Fisher[employer]Paykel Full Face Simplus mask and heated humidification. ? ?Encourage weight loss measures ? ?Follow-up in the office 4 to 6 weeks following initiation of treatment ? ?

## 2021-12-25 NOTE — Procedures (Signed)
POLYSOMNOGRAPHY ? ?Last, First: Eric Douglas ?MRN: [id number] ?Gender: Male ?Age (years): 51 ?Weight (lbs): 320 ?DOB: 10/01/1971 ?BMI: 40 ?Primary Care: No PCP ?Epworth Score: 17 ?Referring: Laurin Coder MD ?Technician: Baxter Flattery ?Interpreting: Laurin Coder MD ?Study Type: CPAP ?Ordered Study Type: CPAP ?Study date: 12/14/2021 ?Location: [facility] ?CLINICAL INFORMATION ?Braelen Douglas is a 51 year old Male and was referred to the sleep center for evaluation of N/A. Indications include Snoring (786.09), Weight Gain, Witnesses Apnea / Gasping During Sleep. ? ?MEDICATIONS ?Patient self administered medications include: N/A. Medications administered during study include No sleep medicine administered. ? ?SLEEP STUDY TECHNIQUE ?The patient underwent an attended overnight polysomnography titration to assess the effects of CPAP therapy. The following variables were monitored: EEG(C4-A1, C3-A2, O1-A2, O2-A1), EOG, submental and leg EMG, ECG, oxyhemoglobin saturation by pulse oximetry, thoracic and abdominal respiratory effort belts, nasal/oral airflow by pressure sensor, body position sensor and snoring sensor. CPAP pressure was titrated to eliminate apneas, hypopneas and oxygen desaturation. Hypopneas were scored per AASM definition IB (4% desaturation) ? ?TECHNICIAN COMMENTS ?Comments added by Technician: Patient had difficulty initiating sleep. ?Comments added by Scorer: N/A ?SLEEP ARCHITECTURE ?The study was initiated at 9:51:09 PM and terminated at 4:18:55 AM. Total recorded time was 387.8 minutes. EEG confirmed total sleep time was 337.8 minutes yielding a sleep efficiency of 87.1%%. Sleep onset after lights out was 10.0 minutes with a REM latency of 70.0 minutes. The patient spent 1.6%% of the night in stage N1 sleep, 69.5%% in stage N2 sleep, 1.5%% in stage N3 and 27.4% in REM. The Arousal Index was 4.4/hour. ?RESPIRATORY PARAMETERS ?The overall AHI was 9.6 per hour, and the RDI was 10.5  events/hour with a central apnea index of 0per hour. The most appropriate setting of CPAP was 15 cm H2O. At this setting, the sleep efficiency was 100% and the patient was supine for 0%. The AHI was 0 events per hour, and the RDI was 0 events/hour (with 0 central events) and the arousal index was 0 per hour.The oxygen nadir was 91.0% during sleep. ? ? ? ?The cumulative time under 88% oxygen saturation was 5.5 minutes ? ?LEG MOVEMENT DATA ?The total leg movements were 0 with a resulting leg movement index of 0.0/hr. Associated arousal with leg movement index was 0.0/hr. ?CARDIAC DATA ?The underlying cardiac rhythm was most consistent with sinus rhythm. Mean heart rate during sleep was 63.4 bpm. Additional rhythm abnormalities include PVCs. ? ?IMPRESSIONS ?- Electrocardiographic data showed presence of PVCs. ?- No snoring was audible during this study. ?- Severe Oxygen Desaturation ?- Severe Obstructive Sleep apnea(OSA) Optimal pressure attained. ?- No significant periodic leg movements(PLMs) during sleep. However, no significant associated arousals. ?- Normal sleep efficiency, normal primary sleep latency, short REM sleep latency and short slow wave latency. ? ?DIAGNOSIS ?- Severe Obstructive Sleep Apnea (G47.33) ? ?RECOMMENDATIONS ?- Trial of CPAP therapy on 15 cm H2O with a Large size Fisher&Paykel Full Face Simplus mask and heated humidification. ?- Avoid alcohol, sedatives and other CNS depressants that may worsen sleep apnea and disrupt normal sleep architecture. ?- Sleep hygiene should be reviewed to assess factors that may improve sleep quality. ?- Weight management and regular exercise should be initiated or continued. ?- Return to Sleep Center for re-evaluation after 4 weeks of therapy ? ?[Electronically signed] 12/25/2021 06:57 AM ? ?Sherrilyn Rist MD ?NPI: [id number] ? ?

## 2021-12-26 NOTE — Telephone Encounter (Signed)
I called the patient and gave him the results as listed below per and he is agreeable to the CPAP. I have placed the order and he will call to make a follow up after his CPAP is placed.Nothing further needed.  ?

## 2021-12-27 DIAGNOSIS — D631 Anemia in chronic kidney disease: Secondary | ICD-10-CM | POA: Diagnosis not present

## 2021-12-27 DIAGNOSIS — Z992 Dependence on renal dialysis: Secondary | ICD-10-CM | POA: Diagnosis not present

## 2021-12-27 DIAGNOSIS — N2589 Other disorders resulting from impaired renal tubular function: Secondary | ICD-10-CM | POA: Diagnosis not present

## 2021-12-27 DIAGNOSIS — N186 End stage renal disease: Secondary | ICD-10-CM | POA: Diagnosis not present

## 2021-12-27 DIAGNOSIS — D509 Iron deficiency anemia, unspecified: Secondary | ICD-10-CM | POA: Diagnosis not present

## 2021-12-27 DIAGNOSIS — N2581 Secondary hyperparathyroidism of renal origin: Secondary | ICD-10-CM | POA: Diagnosis not present

## 2021-12-28 ENCOUNTER — Encounter (HOSPITAL_COMMUNITY): Payer: Self-pay | Admitting: Physician Assistant

## 2021-12-28 ENCOUNTER — Ambulatory Visit (HOSPITAL_COMMUNITY)
Admission: RE | Admit: 2021-12-28 | Discharge: 2021-12-28 | Disposition: A | Discharge status: Home / Self Care | Payer: Medicare Other | Source: Ambulatory Visit | Attending: Physician Assistant | Admitting: Physician Assistant

## 2021-12-28 ENCOUNTER — Other Ambulatory Visit: Payer: Self-pay

## 2021-12-28 VITALS — BP 132/88 | HR 98 | Ht 77.0 in | Wt 335.2 lb

## 2021-12-28 DIAGNOSIS — I38 Endocarditis, valve unspecified: Secondary | ICD-10-CM | POA: Diagnosis not present

## 2021-12-28 DIAGNOSIS — I4819 Other persistent atrial fibrillation: Secondary | ICD-10-CM | POA: Diagnosis not present

## 2021-12-28 DIAGNOSIS — Z6839 Body mass index (BMI) 39.0-39.9, adult: Secondary | ICD-10-CM | POA: Insufficient documentation

## 2021-12-28 DIAGNOSIS — Z79899 Other long term (current) drug therapy: Secondary | ICD-10-CM | POA: Diagnosis not present

## 2021-12-28 DIAGNOSIS — G4733 Obstructive sleep apnea (adult) (pediatric): Secondary | ICD-10-CM | POA: Insufficient documentation

## 2021-12-28 DIAGNOSIS — Z992 Dependence on renal dialysis: Secondary | ICD-10-CM | POA: Diagnosis not present

## 2021-12-28 DIAGNOSIS — Z7901 Long term (current) use of anticoagulants: Secondary | ICD-10-CM | POA: Diagnosis not present

## 2021-12-28 DIAGNOSIS — N186 End stage renal disease: Secondary | ICD-10-CM | POA: Diagnosis not present

## 2021-12-28 DIAGNOSIS — E669 Obesity, unspecified: Secondary | ICD-10-CM | POA: Insufficient documentation

## 2021-12-28 DIAGNOSIS — I120 Hypertensive chronic kidney disease with stage 5 chronic kidney disease or end stage renal disease: Secondary | ICD-10-CM | POA: Diagnosis not present

## 2021-12-28 DIAGNOSIS — N2581 Secondary hyperparathyroidism of renal origin: Secondary | ICD-10-CM | POA: Diagnosis not present

## 2021-12-28 DIAGNOSIS — D631 Anemia in chronic kidney disease: Secondary | ICD-10-CM | POA: Diagnosis not present

## 2021-12-28 DIAGNOSIS — N2589 Other disorders resulting from impaired renal tubular function: Secondary | ICD-10-CM | POA: Diagnosis not present

## 2021-12-28 DIAGNOSIS — D509 Iron deficiency anemia, unspecified: Secondary | ICD-10-CM | POA: Diagnosis not present

## 2021-12-28 MED ORDER — METOPROLOL TARTRATE 50 MG PO TABS: 50.0000 mg | ORAL_TABLET | Freq: Two times a day (BID) | ORAL | 3 refills | Status: DC

## 2021-12-28 NOTE — Patient Instructions (Signed)
Decrease metoprolol to '50mg'$  twice a day ? ?Continue Amiodarone at '200mg'$  twice a day until you see Dr. Quentin Ore ?

## 2021-12-28 NOTE — Progress Notes (Signed)
? ? ?Primary Care Physician: Michela Pitcher, NP ?Primary Cardiologist: Dr Johnsie Cancel ?Primary Electrophysiologist: Dr Quentin Ore (new) ?Referring Physician: Dr Johnsie Cancel ? ? ?Eric Douglas is a 52 y.o. male with a history of HTN, ESRD, atrial fibrillation who presents for follow up in the [facility]. The patient was initially diagnosed with atrial fibrillation 10/22/21 after presenting to the ED with chest discomfort. He spontaneously converted back to SR. He was placed on a 30 day event monitor to evaluate arrhythmia burden. The monitor detected afib on 10/30/21 and he was started on Eliquis for a CHADS2VASC score of 1. Patient reports that since 11/19/21 he has had near constant tachypalpitations and fatigue. He is pending a CPAP titration.  ? ?On follow up today, patient is s/p DCCV on 12/21/21. Patient reports that he stayed in SR for only about 24 hours. He has had some lower BP readings at home since the DCCV. He took 50 mg of metoprolol this morning instead of 75 mg and feels much better. No bleeding issues on anticoagulation.  ? ?Today, he denies symptoms of chest pain, PND, lower extremity edema, dizziness, presyncope, syncope, bleeding, or neurologic sequela. The patient is tolerating medications without difficulties and is otherwise without complaint today.  ? ? ?Atrial Fibrillation Risk Factors: ? ?he does have symptoms or diagnosis of sleep apnea. ?he is pending CPAP titration  ?he does not have a history of rheumatic fever. ? ? ?he has a BMI of Body mass index is 39.75 kg/m?Marland KitchenMarland Kitchen ?Filed Weights  ? 12/28/21 1413  ?Weight: (!) 152 kg  ? ? ? ?Family History  ?Problem Relation Age of Onset  ? Cancer Paternal Grandfather 72  ?     brain cancer  ? Hypertension Other   ?     GP  ? Stroke Other   ? Diabetes Other   ?     DM - GP   ? Heart disease Neg Hx   ? ? ? ?Atrial Fibrillation Management history: ? ?Previous antiarrhythmic drugs: amiodarone  ?Previous cardioversions: 12/21/21 ?Previous  ablations: none ?CHADS2VASC score: 1 ?Anticoagulation history: Eliquis ? ? ?Past Medical History:  ?Diagnosis Date  ? Fibula fracture   ? ORIF 1990  ? Gout   ? non crystal proven   ? History of chickenpox   ? History of kidney disease   ? HTN (hypertension)   ? Nephropathy   ? Ig A; dx 2002. micropscopic hematuria and proteinuria  ? Tibia fracture   ? ORIF 1990  ? Varicocele   ? L testes  ? Venous incompetence   ? R leg  ? ?Past Surgical History:  ?Procedure Laterality Date  ? APPENDECTOMY  1982  ? AV FISTULA PLACEMENT Left 06/27/2017  ? Procedure: LEFT ARM ARTERIOVENOUS (AV) FISTULA CREATION;  Surgeon: Waynetta Sandy, MD;  Location: [facility];  Service: Vascular;  Laterality: Left;  ? CARDIOVERSION N/A 12/21/2021  ? Procedure: CARDIOVERSION;  Surgeon: Sanda Klein, MD;  Location: [facility];  Service: Cardiovascular;  Laterality: N/A;  ? stab phlebectomy Left 12/17/2016  ? stab phlebectomy >20 incisions (left leg) by Tinnie Gens MD   ? ? ?Current Outpatient Medications  ?Medication Sig Dispense Refill  ? allopurinol (ZYLOPRIM) 100 MG tablet Take 100 mg by mouth every morning.  2  ? amiodarone (PACERONE) 200 MG tablet Take 1 tablet (200 mg total) by mouth 2 (two) times daily. 60 tablet 6  ? apixaban (ELIQUIS) 5 MG TABS tablet Take 1 tablet (5 mg total)  by mouth 2 (two) times daily. 60 tablet 3  ? calcitRIOL (ROCALTROL) 0.25 MCG capsule Take 0.5 mcg by mouth every morning.    ? Calcium Polycarbophil (FIBERCON PO) Take 2 tablets by mouth every morning.    ? cinacalcet (SENSIPAR) 90 MG tablet Take 90 mg by mouth daily.    ? gabapentin (NEURONTIN) 300 MG capsule Take 300 mg by mouth at bedtime.    ? Methoxy PEG-Epoetin Beta (MIRCERA IJ) Inject into the skin.    ? metoprolol tartrate (LOPRESSOR) 50 MG tablet Take 1.5 tablets (75 mg total) by mouth 2 (two) times daily. 180 tablet 3  ? mupirocin ointment (BACTROBAN) 2 % Apply 1 application topically daily as needed (port care).    ? sucroferric oxyhydroxide  (VELPHORO) 500 MG chewable tablet Chew 500-1,000 mg by mouth See admin instructions. Chew 2 tablets (1000 mg) by mouth once or twice daily with meals, 500 mg with snacks    ? tamsulosin (FLOMAX) 0.4 MG CAPS capsule TAKE 1 CAPSULE BY MOUTH EVERY DAY 30 capsule 0  ? testosterone cypionate (DEPOTESTOSTERONE CYPIONATE) 200 MG/ML injection INJECT 1 ML (200 MG TOTAL) INTO THE MUSCLE EVERY 14 (FOURTEEN) DAYS. MUST SCHEDULE ANNUAL EXAM 2 mL 0  ? ?No current facility-administered medications for this encounter.  ? ? ?No Known Allergies ? ?Social History  ? ?Socioeconomic History  ? Marital status: Divorced  ?  Spouse name: Not on file  ? Number of children: 2  ? Years of education: Not on file  ? Highest education level: Not on file  ?Occupational History  ? Occupation: Hydrologist  ?  Comment: [employer]  ?Tobacco Use  ? Smoking status: Never  ? Smokeless tobacco: Never  ? Tobacco comments:  ?  Never smoke 12/28/21  ?Vaping Use  ? Vaping Use: Never used  ?Substance and Sexual Activity  ? Alcohol use: No  ?  Alcohol/week: 0.0 standard drinks  ? Drug use: No  ? Sexual activity: Yes  ?Other Topics Concern  ? Not on file  ?Social History Narrative  ? 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   ?   ? Pt signed designated party release and gives no access to medical records. Can leave msg on cell [phone].   ? ?Social Determinants of Health  ? ?Financial Resource Strain: Low Risk   ? Difficulty of Paying Living Expenses: Not hard at all  ?Food Insecurity: No Food Insecurity  ? Worried About Charity fundraiser in the Last Year: Never true  ? Ran Out of Food in the Last Year: Never true  ?Transportation Needs: Not on file  ?Physical Activity: Not on file  ?Stress: Not on file  ?Social Connections: Not on file  ?Intimate Partner Violence: Not on file  ? ? ? ?ROS- All systems are reviewed and negative except as per the HPI above. ? ?Physical Exam: ?Vitals:  ? 12/28/21 1413  ?BP: 132/88   ?Pulse: 98  ?Weight: (!) 152 kg  ?Height: '6\' 5"'$  (1.956 m)  ? ? ?GEN- The patient is a well appearing obese male, alert and oriented x 3 today.   ?HEENT-head normocephalic, atraumatic, sclera clear, conjunctiva pink, hearing intact, trachea midline. ?Lungs- Clear to ausculation bilaterally, normal work of breathing ?Heart- irregular rate and rhythm, no murmurs, rubs or gallops  ?GI- soft, NT, ND, + BS ?Extremities- no clubbing, cyanosis, or edema ?MS- no significant deformity or atrophy ?Skin- no rash or lesion ?Psych- euthymic mood, full affect ?  Neuro- strength and sensation are intact ? ? ?Wt Readings from Last 3 Encounters:  ?12/28/21 (!) 152 kg  ?12/14/21 (!) 145.2 kg  ?11/29/21 (!) 152.1 kg  ? ? ?EKG today demonstrates  ?Afib ?Vent. rate 98 BPM ?PR interval * ms ?QRS duration 94 ms ?QT/QTcB 354/451 ms ? ?Echo 10/23/21 demonstrated  ? 1. Left ventricular ejection fraction, by estimation, is 55 to 60%. The  ?left ventricle has normal function. The left ventricle has no regional  ?wall motion abnormalities. The left ventricular internal cavity size was mildly dilated. There is mild left ventricular hypertrophy. Left ventricular diastolic parameters were normal.  ? 2. Right ventricular systolic function is normal. The right ventricular  ?size is mildly enlarged. There is moderately elevated pulmonary artery systolic pressure. The estimated right ventricular systolic pressure is 82.5 mmHg.  ? 3. Left atrial size was mildly dilated.  ? 4. The aortic valve is tricuspid. Aortic valve regurgitation is not  ?visualized. Aortic valve sclerosis/calcification is present, without any  ?evidence of aortic stenosis.  ? 5. The inferior vena cava is dilated in size with <50% respiratory  ?variability, suggesting right atrial pressure of 15 mmHg.  ? 6. The mitral valve is abnormal. Moderate leaflet thickening. Moderate mitral valve regurgitation. MR jet is not well visualized on long axis images, but appears more significant on MV  short axis image (image 17). Does have LA/LV dilatation. Consider TEE vs cardiac MRI for further evaluation  ? ?Epic records are reviewed at length today ? ?CHA2DS2-VASc Score = 1  ?The patient's score is base

## 2021-12-30 DIAGNOSIS — D631 Anemia in chronic kidney disease: Secondary | ICD-10-CM | POA: Diagnosis not present

## 2021-12-30 DIAGNOSIS — N2581 Secondary hyperparathyroidism of renal origin: Secondary | ICD-10-CM | POA: Diagnosis not present

## 2021-12-30 DIAGNOSIS — D509 Iron deficiency anemia, unspecified: Secondary | ICD-10-CM | POA: Diagnosis not present

## 2021-12-30 DIAGNOSIS — N186 End stage renal disease: Secondary | ICD-10-CM | POA: Diagnosis not present

## 2021-12-30 DIAGNOSIS — Z992 Dependence on renal dialysis: Secondary | ICD-10-CM | POA: Diagnosis not present

## 2021-12-30 DIAGNOSIS — N2589 Other disorders resulting from impaired renal tubular function: Secondary | ICD-10-CM | POA: Diagnosis not present

## 2021-12-31 DIAGNOSIS — D631 Anemia in chronic kidney disease: Secondary | ICD-10-CM | POA: Diagnosis not present

## 2021-12-31 DIAGNOSIS — N186 End stage renal disease: Secondary | ICD-10-CM | POA: Diagnosis not present

## 2021-12-31 DIAGNOSIS — Z992 Dependence on renal dialysis: Secondary | ICD-10-CM | POA: Diagnosis not present

## 2021-12-31 DIAGNOSIS — N2589 Other disorders resulting from impaired renal tubular function: Secondary | ICD-10-CM | POA: Diagnosis not present

## 2021-12-31 DIAGNOSIS — D509 Iron deficiency anemia, unspecified: Secondary | ICD-10-CM | POA: Diagnosis not present

## 2021-12-31 DIAGNOSIS — N2581 Secondary hyperparathyroidism of renal origin: Secondary | ICD-10-CM | POA: Diagnosis not present

## 2022-01-01 DIAGNOSIS — D631 Anemia in chronic kidney disease: Secondary | ICD-10-CM | POA: Diagnosis not present

## 2022-01-01 DIAGNOSIS — N2589 Other disorders resulting from impaired renal tubular function: Secondary | ICD-10-CM | POA: Diagnosis not present

## 2022-01-01 DIAGNOSIS — Z992 Dependence on renal dialysis: Secondary | ICD-10-CM | POA: Diagnosis not present

## 2022-01-01 DIAGNOSIS — N186 End stage renal disease: Secondary | ICD-10-CM | POA: Diagnosis not present

## 2022-01-01 DIAGNOSIS — N2581 Secondary hyperparathyroidism of renal origin: Secondary | ICD-10-CM | POA: Diagnosis not present

## 2022-01-01 DIAGNOSIS — D509 Iron deficiency anemia, unspecified: Secondary | ICD-10-CM | POA: Diagnosis not present

## 2022-01-03 DIAGNOSIS — N2589 Other disorders resulting from impaired renal tubular function: Secondary | ICD-10-CM | POA: Diagnosis not present

## 2022-01-03 DIAGNOSIS — N2581 Secondary hyperparathyroidism of renal origin: Secondary | ICD-10-CM | POA: Diagnosis not present

## 2022-01-03 DIAGNOSIS — D631 Anemia in chronic kidney disease: Secondary | ICD-10-CM | POA: Diagnosis not present

## 2022-01-03 DIAGNOSIS — N186 End stage renal disease: Secondary | ICD-10-CM | POA: Diagnosis not present

## 2022-01-03 DIAGNOSIS — D509 Iron deficiency anemia, unspecified: Secondary | ICD-10-CM | POA: Diagnosis not present

## 2022-01-03 DIAGNOSIS — Z992 Dependence on renal dialysis: Secondary | ICD-10-CM | POA: Diagnosis not present

## 2022-01-04 DIAGNOSIS — N2581 Secondary hyperparathyroidism of renal origin: Secondary | ICD-10-CM | POA: Diagnosis not present

## 2022-01-04 DIAGNOSIS — D509 Iron deficiency anemia, unspecified: Secondary | ICD-10-CM | POA: Diagnosis not present

## 2022-01-04 DIAGNOSIS — Z992 Dependence on renal dialysis: Secondary | ICD-10-CM | POA: Diagnosis not present

## 2022-01-04 DIAGNOSIS — D631 Anemia in chronic kidney disease: Secondary | ICD-10-CM | POA: Diagnosis not present

## 2022-01-04 DIAGNOSIS — N2589 Other disorders resulting from impaired renal tubular function: Secondary | ICD-10-CM | POA: Diagnosis not present

## 2022-01-04 DIAGNOSIS — N186 End stage renal disease: Secondary | ICD-10-CM | POA: Diagnosis not present

## 2022-01-06 DIAGNOSIS — N2581 Secondary hyperparathyroidism of renal origin: Secondary | ICD-10-CM | POA: Diagnosis not present

## 2022-01-06 DIAGNOSIS — D509 Iron deficiency anemia, unspecified: Secondary | ICD-10-CM | POA: Diagnosis not present

## 2022-01-06 DIAGNOSIS — N2589 Other disorders resulting from impaired renal tubular function: Secondary | ICD-10-CM | POA: Diagnosis not present

## 2022-01-06 DIAGNOSIS — Z992 Dependence on renal dialysis: Secondary | ICD-10-CM | POA: Diagnosis not present

## 2022-01-06 DIAGNOSIS — D631 Anemia in chronic kidney disease: Secondary | ICD-10-CM | POA: Diagnosis not present

## 2022-01-06 DIAGNOSIS — N186 End stage renal disease: Secondary | ICD-10-CM | POA: Diagnosis not present

## 2022-01-07 DIAGNOSIS — N186 End stage renal disease: Secondary | ICD-10-CM | POA: Diagnosis not present

## 2022-01-07 DIAGNOSIS — Z992 Dependence on renal dialysis: Secondary | ICD-10-CM | POA: Diagnosis not present

## 2022-01-07 DIAGNOSIS — N2581 Secondary hyperparathyroidism of renal origin: Secondary | ICD-10-CM | POA: Diagnosis not present

## 2022-01-07 DIAGNOSIS — D509 Iron deficiency anemia, unspecified: Secondary | ICD-10-CM | POA: Diagnosis not present

## 2022-01-07 DIAGNOSIS — N2589 Other disorders resulting from impaired renal tubular function: Secondary | ICD-10-CM | POA: Diagnosis not present

## 2022-01-07 DIAGNOSIS — D631 Anemia in chronic kidney disease: Secondary | ICD-10-CM | POA: Diagnosis not present

## 2022-01-08 DIAGNOSIS — D631 Anemia in chronic kidney disease: Secondary | ICD-10-CM | POA: Diagnosis not present

## 2022-01-08 DIAGNOSIS — N186 End stage renal disease: Secondary | ICD-10-CM | POA: Diagnosis not present

## 2022-01-08 DIAGNOSIS — N2581 Secondary hyperparathyroidism of renal origin: Secondary | ICD-10-CM | POA: Diagnosis not present

## 2022-01-08 DIAGNOSIS — N2589 Other disorders resulting from impaired renal tubular function: Secondary | ICD-10-CM | POA: Diagnosis not present

## 2022-01-08 DIAGNOSIS — Z992 Dependence on renal dialysis: Secondary | ICD-10-CM | POA: Diagnosis not present

## 2022-01-08 DIAGNOSIS — D509 Iron deficiency anemia, unspecified: Secondary | ICD-10-CM | POA: Diagnosis not present

## 2022-01-09 ENCOUNTER — Other Ambulatory Visit (HOSPITAL_COMMUNITY): Payer: Self-pay | Admitting: *Deleted

## 2022-01-09 MED ORDER — METOPROLOL TARTRATE 50 MG PO TABS: 50.0000 mg | ORAL_TABLET | Freq: Two times a day (BID) | ORAL | 3 refills | Status: DC

## 2022-01-10 DIAGNOSIS — N2589 Other disorders resulting from impaired renal tubular function: Secondary | ICD-10-CM | POA: Diagnosis not present

## 2022-01-10 DIAGNOSIS — N2581 Secondary hyperparathyroidism of renal origin: Secondary | ICD-10-CM | POA: Diagnosis not present

## 2022-01-10 DIAGNOSIS — Z992 Dependence on renal dialysis: Secondary | ICD-10-CM | POA: Diagnosis not present

## 2022-01-10 DIAGNOSIS — N186 End stage renal disease: Secondary | ICD-10-CM | POA: Diagnosis not present

## 2022-01-10 DIAGNOSIS — D631 Anemia in chronic kidney disease: Secondary | ICD-10-CM | POA: Diagnosis not present

## 2022-01-10 DIAGNOSIS — D509 Iron deficiency anemia, unspecified: Secondary | ICD-10-CM | POA: Diagnosis not present

## 2022-01-12 DIAGNOSIS — N2589 Other disorders resulting from impaired renal tubular function: Secondary | ICD-10-CM | POA: Diagnosis not present

## 2022-01-12 DIAGNOSIS — N186 End stage renal disease: Secondary | ICD-10-CM | POA: Diagnosis not present

## 2022-01-12 DIAGNOSIS — D509 Iron deficiency anemia, unspecified: Secondary | ICD-10-CM | POA: Diagnosis not present

## 2022-01-12 DIAGNOSIS — N2581 Secondary hyperparathyroidism of renal origin: Secondary | ICD-10-CM | POA: Diagnosis not present

## 2022-01-12 DIAGNOSIS — Z992 Dependence on renal dialysis: Secondary | ICD-10-CM | POA: Diagnosis not present

## 2022-01-12 DIAGNOSIS — D631 Anemia in chronic kidney disease: Secondary | ICD-10-CM | POA: Diagnosis not present

## 2022-01-13 DIAGNOSIS — N186 End stage renal disease: Secondary | ICD-10-CM | POA: Diagnosis not present

## 2022-01-13 DIAGNOSIS — N028 Recurrent and persistent hematuria with other morphologic changes: Secondary | ICD-10-CM | POA: Diagnosis not present

## 2022-01-13 DIAGNOSIS — Z992 Dependence on renal dialysis: Secondary | ICD-10-CM | POA: Diagnosis not present

## 2022-01-14 DIAGNOSIS — D631 Anemia in chronic kidney disease: Secondary | ICD-10-CM | POA: Diagnosis not present

## 2022-01-14 DIAGNOSIS — N2581 Secondary hyperparathyroidism of renal origin: Secondary | ICD-10-CM | POA: Diagnosis not present

## 2022-01-14 DIAGNOSIS — Z992 Dependence on renal dialysis: Secondary | ICD-10-CM | POA: Diagnosis not present

## 2022-01-14 DIAGNOSIS — I120 Hypertensive chronic kidney disease with stage 5 chronic kidney disease or end stage renal disease: Secondary | ICD-10-CM | POA: Diagnosis not present

## 2022-01-14 DIAGNOSIS — D509 Iron deficiency anemia, unspecified: Secondary | ICD-10-CM | POA: Diagnosis not present

## 2022-01-14 DIAGNOSIS — N186 End stage renal disease: Secondary | ICD-10-CM | POA: Diagnosis not present

## 2022-01-14 DIAGNOSIS — E440 Moderate protein-calorie malnutrition: Secondary | ICD-10-CM | POA: Diagnosis not present

## 2022-01-14 DIAGNOSIS — Z79899 Other long term (current) drug therapy: Secondary | ICD-10-CM | POA: Diagnosis not present

## 2022-01-16 DIAGNOSIS — N2581 Secondary hyperparathyroidism of renal origin: Secondary | ICD-10-CM | POA: Diagnosis not present

## 2022-01-16 DIAGNOSIS — N186 End stage renal disease: Secondary | ICD-10-CM | POA: Diagnosis not present

## 2022-01-16 DIAGNOSIS — Z992 Dependence on renal dialysis: Secondary | ICD-10-CM | POA: Diagnosis not present

## 2022-01-16 DIAGNOSIS — Z79899 Other long term (current) drug therapy: Secondary | ICD-10-CM | POA: Diagnosis not present

## 2022-01-16 DIAGNOSIS — D509 Iron deficiency anemia, unspecified: Secondary | ICD-10-CM | POA: Diagnosis not present

## 2022-01-16 DIAGNOSIS — E440 Moderate protein-calorie malnutrition: Secondary | ICD-10-CM | POA: Diagnosis not present

## 2022-01-18 DIAGNOSIS — D509 Iron deficiency anemia, unspecified: Secondary | ICD-10-CM | POA: Diagnosis not present

## 2022-01-18 DIAGNOSIS — E440 Moderate protein-calorie malnutrition: Secondary | ICD-10-CM | POA: Diagnosis not present

## 2022-01-18 DIAGNOSIS — N2581 Secondary hyperparathyroidism of renal origin: Secondary | ICD-10-CM | POA: Diagnosis not present

## 2022-01-18 DIAGNOSIS — Z79899 Other long term (current) drug therapy: Secondary | ICD-10-CM | POA: Diagnosis not present

## 2022-01-18 DIAGNOSIS — Z992 Dependence on renal dialysis: Secondary | ICD-10-CM | POA: Diagnosis not present

## 2022-01-18 DIAGNOSIS — N186 End stage renal disease: Secondary | ICD-10-CM | POA: Diagnosis not present

## 2022-01-19 DIAGNOSIS — Z992 Dependence on renal dialysis: Secondary | ICD-10-CM | POA: Diagnosis not present

## 2022-01-19 DIAGNOSIS — D509 Iron deficiency anemia, unspecified: Secondary | ICD-10-CM | POA: Diagnosis not present

## 2022-01-19 DIAGNOSIS — N186 End stage renal disease: Secondary | ICD-10-CM | POA: Diagnosis not present

## 2022-01-19 DIAGNOSIS — Z79899 Other long term (current) drug therapy: Secondary | ICD-10-CM | POA: Diagnosis not present

## 2022-01-19 DIAGNOSIS — E440 Moderate protein-calorie malnutrition: Secondary | ICD-10-CM | POA: Diagnosis not present

## 2022-01-19 DIAGNOSIS — N2581 Secondary hyperparathyroidism of renal origin: Secondary | ICD-10-CM | POA: Diagnosis not present

## 2022-01-20 DIAGNOSIS — N186 End stage renal disease: Secondary | ICD-10-CM | POA: Diagnosis not present

## 2022-01-20 DIAGNOSIS — Z992 Dependence on renal dialysis: Secondary | ICD-10-CM | POA: Diagnosis not present

## 2022-01-20 DIAGNOSIS — N2581 Secondary hyperparathyroidism of renal origin: Secondary | ICD-10-CM | POA: Diagnosis not present

## 2022-01-20 DIAGNOSIS — E440 Moderate protein-calorie malnutrition: Secondary | ICD-10-CM | POA: Diagnosis not present

## 2022-01-20 DIAGNOSIS — Z79899 Other long term (current) drug therapy: Secondary | ICD-10-CM | POA: Diagnosis not present

## 2022-01-20 DIAGNOSIS — D509 Iron deficiency anemia, unspecified: Secondary | ICD-10-CM | POA: Diagnosis not present

## 2022-01-21 DIAGNOSIS — N2581 Secondary hyperparathyroidism of renal origin: Secondary | ICD-10-CM | POA: Diagnosis not present

## 2022-01-21 DIAGNOSIS — Z992 Dependence on renal dialysis: Secondary | ICD-10-CM | POA: Diagnosis not present

## 2022-01-21 DIAGNOSIS — E440 Moderate protein-calorie malnutrition: Secondary | ICD-10-CM | POA: Diagnosis not present

## 2022-01-21 DIAGNOSIS — D509 Iron deficiency anemia, unspecified: Secondary | ICD-10-CM | POA: Diagnosis not present

## 2022-01-21 DIAGNOSIS — Z79899 Other long term (current) drug therapy: Secondary | ICD-10-CM | POA: Diagnosis not present

## 2022-01-21 DIAGNOSIS — N186 End stage renal disease: Secondary | ICD-10-CM | POA: Diagnosis not present

## 2022-01-22 DIAGNOSIS — Z79899 Other long term (current) drug therapy: Secondary | ICD-10-CM | POA: Diagnosis not present

## 2022-01-22 DIAGNOSIS — N2581 Secondary hyperparathyroidism of renal origin: Secondary | ICD-10-CM | POA: Diagnosis not present

## 2022-01-22 DIAGNOSIS — Z992 Dependence on renal dialysis: Secondary | ICD-10-CM | POA: Diagnosis not present

## 2022-01-22 DIAGNOSIS — E440 Moderate protein-calorie malnutrition: Secondary | ICD-10-CM | POA: Diagnosis not present

## 2022-01-22 DIAGNOSIS — D509 Iron deficiency anemia, unspecified: Secondary | ICD-10-CM | POA: Diagnosis not present

## 2022-01-22 DIAGNOSIS — N186 End stage renal disease: Secondary | ICD-10-CM | POA: Diagnosis not present

## 2022-01-25 DIAGNOSIS — Z79899 Other long term (current) drug therapy: Secondary | ICD-10-CM | POA: Diagnosis not present

## 2022-01-25 DIAGNOSIS — Z992 Dependence on renal dialysis: Secondary | ICD-10-CM | POA: Diagnosis not present

## 2022-01-25 DIAGNOSIS — N186 End stage renal disease: Secondary | ICD-10-CM | POA: Diagnosis not present

## 2022-01-25 DIAGNOSIS — E440 Moderate protein-calorie malnutrition: Secondary | ICD-10-CM | POA: Diagnosis not present

## 2022-01-25 DIAGNOSIS — N2581 Secondary hyperparathyroidism of renal origin: Secondary | ICD-10-CM | POA: Diagnosis not present

## 2022-01-25 DIAGNOSIS — D509 Iron deficiency anemia, unspecified: Secondary | ICD-10-CM | POA: Diagnosis not present

## 2022-01-26 DIAGNOSIS — E440 Moderate protein-calorie malnutrition: Secondary | ICD-10-CM | POA: Diagnosis not present

## 2022-01-26 DIAGNOSIS — N2581 Secondary hyperparathyroidism of renal origin: Secondary | ICD-10-CM | POA: Diagnosis not present

## 2022-01-26 DIAGNOSIS — Z992 Dependence on renal dialysis: Secondary | ICD-10-CM | POA: Diagnosis not present

## 2022-01-26 DIAGNOSIS — Z79899 Other long term (current) drug therapy: Secondary | ICD-10-CM | POA: Diagnosis not present

## 2022-01-26 DIAGNOSIS — M1040 Other secondary gout, unspecified site: Secondary | ICD-10-CM | POA: Diagnosis not present

## 2022-01-26 DIAGNOSIS — N186 End stage renal disease: Secondary | ICD-10-CM | POA: Diagnosis not present

## 2022-01-26 DIAGNOSIS — D509 Iron deficiency anemia, unspecified: Secondary | ICD-10-CM | POA: Diagnosis not present

## 2022-01-29 DIAGNOSIS — N186 End stage renal disease: Secondary | ICD-10-CM | POA: Diagnosis not present

## 2022-01-29 DIAGNOSIS — N2581 Secondary hyperparathyroidism of renal origin: Secondary | ICD-10-CM | POA: Diagnosis not present

## 2022-01-29 DIAGNOSIS — D509 Iron deficiency anemia, unspecified: Secondary | ICD-10-CM | POA: Diagnosis not present

## 2022-01-29 DIAGNOSIS — R948 Abnormal results of function studies of other organs and systems: Secondary | ICD-10-CM | POA: Diagnosis not present

## 2022-01-29 DIAGNOSIS — Z79899 Other long term (current) drug therapy: Secondary | ICD-10-CM | POA: Diagnosis not present

## 2022-01-29 DIAGNOSIS — Z992 Dependence on renal dialysis: Secondary | ICD-10-CM | POA: Diagnosis not present

## 2022-01-29 DIAGNOSIS — E291 Testicular hypofunction: Secondary | ICD-10-CM | POA: Diagnosis not present

## 2022-01-29 DIAGNOSIS — E440 Moderate protein-calorie malnutrition: Secondary | ICD-10-CM | POA: Diagnosis not present

## 2022-01-29 IMAGING — MR MR HEAD W/O CM
9 of 10 series · 43 of 48 positions shown · non-contrast
Comparison: None.

CLINICAL DATA: Sudden onset sensorineural hearing loss, right.

EXAM:
MRI HEAD WITHOUT CONTRAST
TECHNIQUE: Multiplanar, multiecho pulse sequences of the brain and surrounding
structures were obtained without intravenous contrast.

[Series 5: T1 · sagittal · 4.0mm · 0.72mm/px · 3 of 27 slices shown (1 of 3)]
[im 1/27]
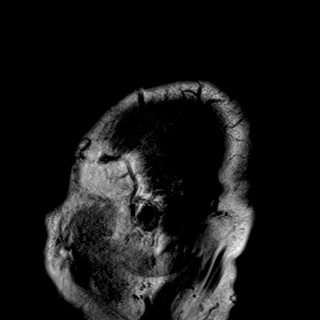
[im 14/27]
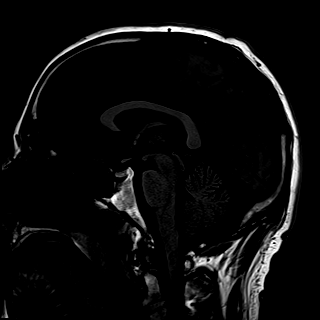
[im 27/27]
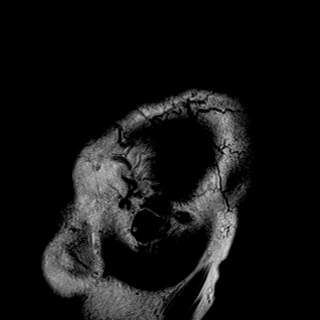

[Series 6: DWI · axial · 3.0mm · 0.94mm/px · z∈[-41,+101]mm · 15 of 168 slices shown]
[im 1/168]
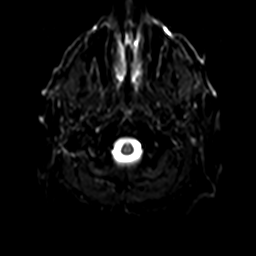
[im 12/168]
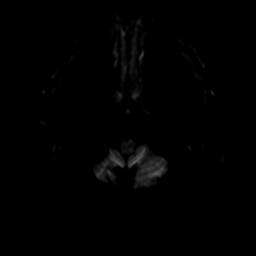
[im 24/168]
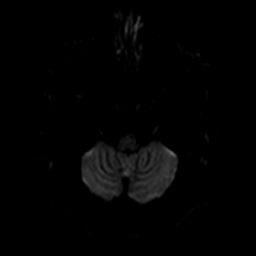
[im 36/168]
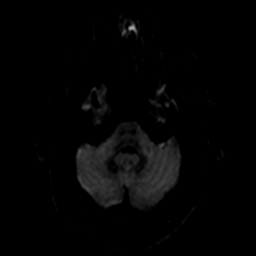
[im 48/168]
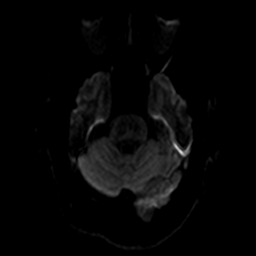
[im 60/168]
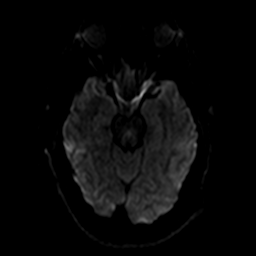
[im 72/168]
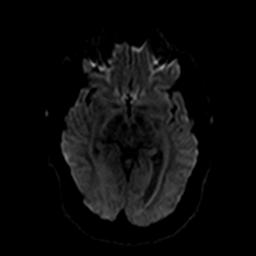
[im 84/168]
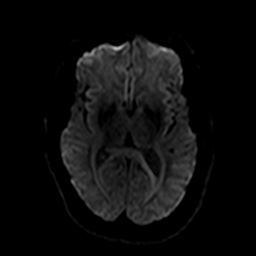
[im 96/168]
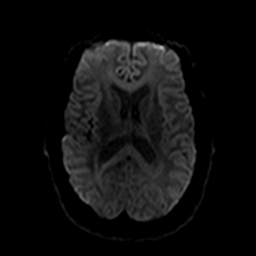
[im 108/168]
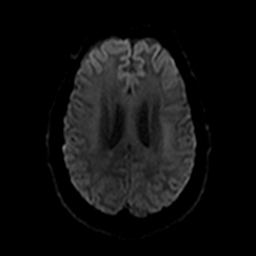
[im 120/168]
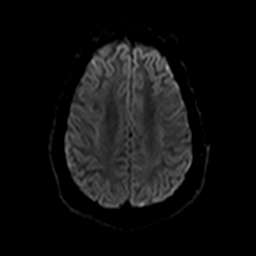
[im 132/168]
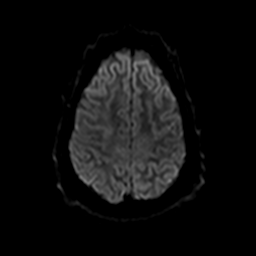
[im 144/168]
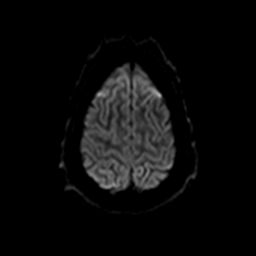
[im 156/168]
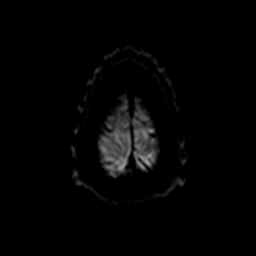
[im 168/168]
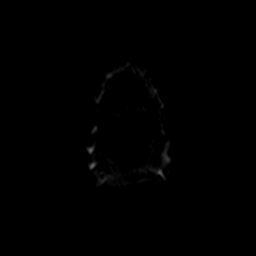

[Series 7: ax dwi_tracew · axial · 3.0mm · 0.94mm/px · z∈[-41,+101]mm · 7 of 84 slices shown]
[im 1/84]
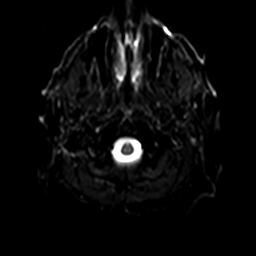
[im 14/84]
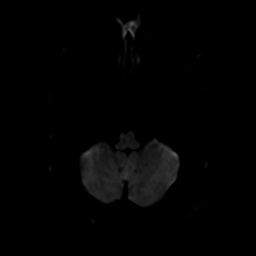
[im 28/84]
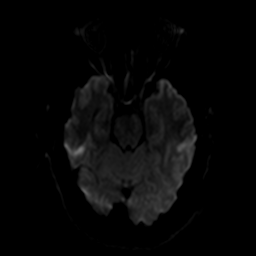
[im 42/84]
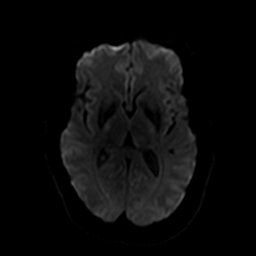
[im 56/84]
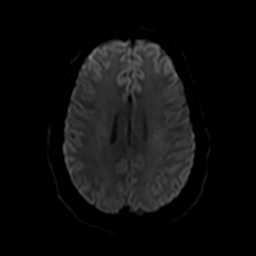
[im 70/84]
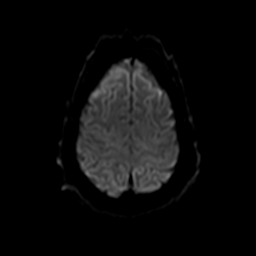
[im 84/84]
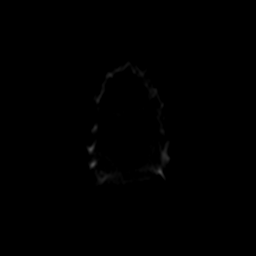

[Series 8: ax dwi_adc · axial · 3.0mm · 0.94mm/px · z∈[-41,+101]mm · 3 of 41 slices shown]
[im 1/41]
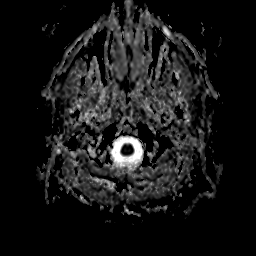
[im 21/41]
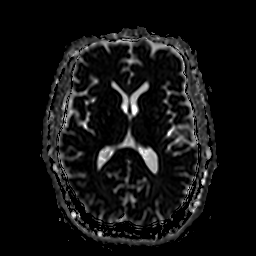
[im 41/41]
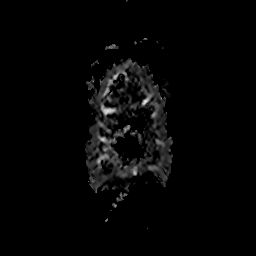

[Series 9: T2 · axial · 4.0mm · 0.36mm/px · z∈[-48,+104]mm · 3 of 31 slices shown]
[im 1/31]
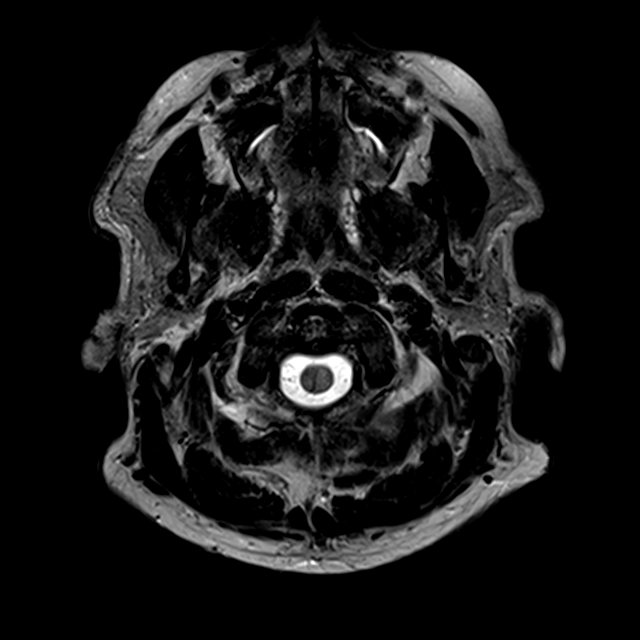
[im 16/31]
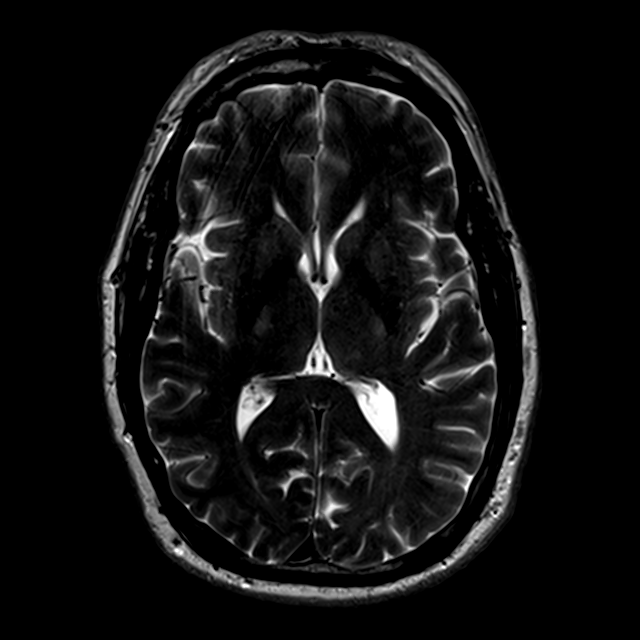
[im 31/31]
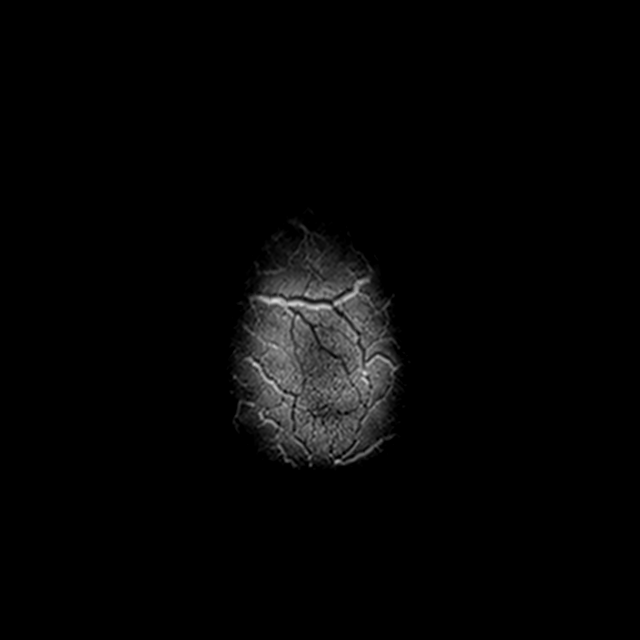

[Series 10: FLAIR · axial · 3.0mm · 0.72mm/px · z∈[-51,+107]mm · 2 of 28 slices shown]
[im 1/28]
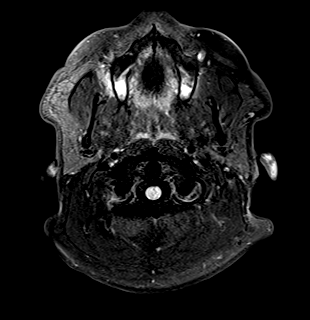
[im 28/28]
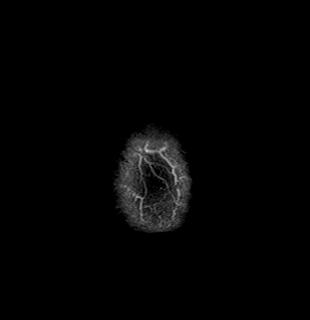

[Series 11: swi_images · axial · 1.5mm · 0.90mm/px · z∈[-42,+96]mm · 8 of 96 slices shown]
[im 1/96]
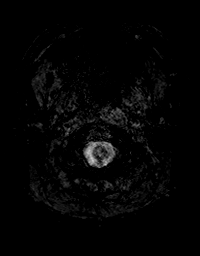
[im 14/96]
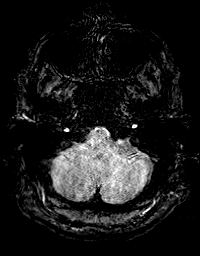
[im 28/96]
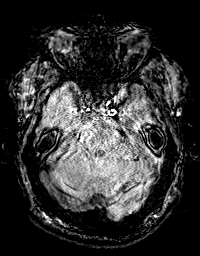
[im 41/96]
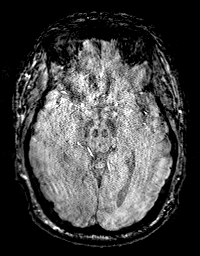
[im 55/96]
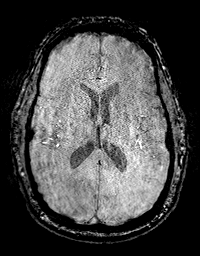
[im 68/96]
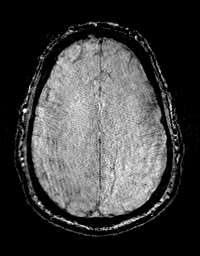
[im 82/96]
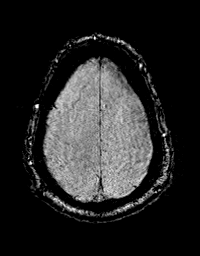
[im 96/96]
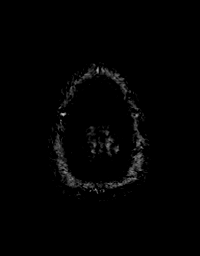

[Series 13: T1 · coronal · 3.0mm · 0.56mm/px · 1 of 13 slices shown (2 of 3)]
[im 1/13]
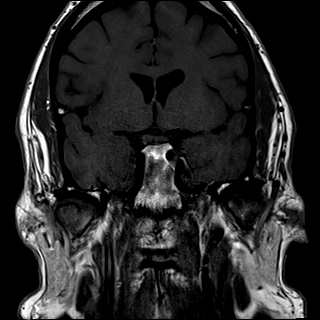

[Series 14: T1 · axial · 3.0mm · 0.50mm/px · 1 of 13 slices shown (3 of 3)]
[im 1/13]
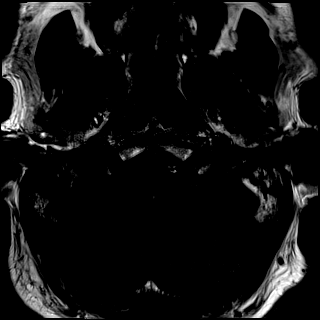

[43 of 48 positions shown; findings below may reference images not displayed]

FINDINGS: Brain: No acute infarction, hemorrhage, hydrocephalus, extra-axial
collection or mass lesion.

No cerebellopontine angle mass or internal auditory canal lesion is
demonstrated.Normal appearance of the 7th and 8th cranial nerves
bilaterally.

Vascular: Normal flow voids.

Skull and upper cervical spine: Normal marrow signal.

Sinuses/Orbits: Trace mucosal thickening throughout the paranasal
sinuses. The orbits are maintained.

Other: Mild left mastoid effusion.
IMPRESSION: Unremarkable MRI of the brain. Specifically, no cerebellopontine
angle or internal auditory canal lesion identified.

## 2022-01-30 DIAGNOSIS — N186 End stage renal disease: Secondary | ICD-10-CM | POA: Diagnosis not present

## 2022-01-30 DIAGNOSIS — D509 Iron deficiency anemia, unspecified: Secondary | ICD-10-CM | POA: Diagnosis not present

## 2022-01-30 DIAGNOSIS — N2581 Secondary hyperparathyroidism of renal origin: Secondary | ICD-10-CM | POA: Diagnosis not present

## 2022-01-30 DIAGNOSIS — Z992 Dependence on renal dialysis: Secondary | ICD-10-CM | POA: Diagnosis not present

## 2022-01-30 DIAGNOSIS — Z79899 Other long term (current) drug therapy: Secondary | ICD-10-CM | POA: Diagnosis not present

## 2022-01-30 DIAGNOSIS — E440 Moderate protein-calorie malnutrition: Secondary | ICD-10-CM | POA: Diagnosis not present

## 2022-01-31 DIAGNOSIS — Z992 Dependence on renal dialysis: Secondary | ICD-10-CM | POA: Diagnosis not present

## 2022-01-31 DIAGNOSIS — E440 Moderate protein-calorie malnutrition: Secondary | ICD-10-CM | POA: Diagnosis not present

## 2022-01-31 DIAGNOSIS — D509 Iron deficiency anemia, unspecified: Secondary | ICD-10-CM | POA: Diagnosis not present

## 2022-01-31 DIAGNOSIS — N2581 Secondary hyperparathyroidism of renal origin: Secondary | ICD-10-CM | POA: Diagnosis not present

## 2022-01-31 DIAGNOSIS — Z79899 Other long term (current) drug therapy: Secondary | ICD-10-CM | POA: Diagnosis not present

## 2022-01-31 DIAGNOSIS — N186 End stage renal disease: Secondary | ICD-10-CM | POA: Diagnosis not present

## 2022-02-01 DIAGNOSIS — N2581 Secondary hyperparathyroidism of renal origin: Secondary | ICD-10-CM | POA: Diagnosis not present

## 2022-02-01 DIAGNOSIS — Z79899 Other long term (current) drug therapy: Secondary | ICD-10-CM | POA: Diagnosis not present

## 2022-02-01 DIAGNOSIS — N186 End stage renal disease: Secondary | ICD-10-CM | POA: Diagnosis not present

## 2022-02-01 DIAGNOSIS — Z992 Dependence on renal dialysis: Secondary | ICD-10-CM | POA: Diagnosis not present

## 2022-02-01 DIAGNOSIS — D509 Iron deficiency anemia, unspecified: Secondary | ICD-10-CM | POA: Diagnosis not present

## 2022-02-01 DIAGNOSIS — E440 Moderate protein-calorie malnutrition: Secondary | ICD-10-CM | POA: Diagnosis not present

## 2022-02-02 DIAGNOSIS — N2581 Secondary hyperparathyroidism of renal origin: Secondary | ICD-10-CM | POA: Diagnosis not present

## 2022-02-02 DIAGNOSIS — E440 Moderate protein-calorie malnutrition: Secondary | ICD-10-CM | POA: Diagnosis not present

## 2022-02-02 DIAGNOSIS — D509 Iron deficiency anemia, unspecified: Secondary | ICD-10-CM | POA: Diagnosis not present

## 2022-02-02 DIAGNOSIS — Z79899 Other long term (current) drug therapy: Secondary | ICD-10-CM | POA: Diagnosis not present

## 2022-02-02 DIAGNOSIS — N186 End stage renal disease: Secondary | ICD-10-CM | POA: Diagnosis not present

## 2022-02-02 DIAGNOSIS — Z992 Dependence on renal dialysis: Secondary | ICD-10-CM | POA: Diagnosis not present

## 2022-02-05 DIAGNOSIS — Z79899 Other long term (current) drug therapy: Secondary | ICD-10-CM | POA: Diagnosis not present

## 2022-02-05 DIAGNOSIS — N186 End stage renal disease: Secondary | ICD-10-CM | POA: Diagnosis not present

## 2022-02-05 DIAGNOSIS — D509 Iron deficiency anemia, unspecified: Secondary | ICD-10-CM | POA: Diagnosis not present

## 2022-02-05 DIAGNOSIS — E440 Moderate protein-calorie malnutrition: Secondary | ICD-10-CM | POA: Diagnosis not present

## 2022-02-05 DIAGNOSIS — Z992 Dependence on renal dialysis: Secondary | ICD-10-CM | POA: Diagnosis not present

## 2022-02-05 DIAGNOSIS — N2581 Secondary hyperparathyroidism of renal origin: Secondary | ICD-10-CM | POA: Diagnosis not present

## 2022-02-06 DIAGNOSIS — Z79899 Other long term (current) drug therapy: Secondary | ICD-10-CM | POA: Diagnosis not present

## 2022-02-06 DIAGNOSIS — E440 Moderate protein-calorie malnutrition: Secondary | ICD-10-CM | POA: Diagnosis not present

## 2022-02-06 DIAGNOSIS — D509 Iron deficiency anemia, unspecified: Secondary | ICD-10-CM | POA: Diagnosis not present

## 2022-02-06 DIAGNOSIS — Z992 Dependence on renal dialysis: Secondary | ICD-10-CM | POA: Diagnosis not present

## 2022-02-06 DIAGNOSIS — N186 End stage renal disease: Secondary | ICD-10-CM | POA: Diagnosis not present

## 2022-02-06 DIAGNOSIS — N2581 Secondary hyperparathyroidism of renal origin: Secondary | ICD-10-CM | POA: Diagnosis not present

## 2022-02-07 ENCOUNTER — Encounter: Payer: Self-pay | Admitting: Cardiology

## 2022-02-07 ENCOUNTER — Ambulatory Visit (INDEPENDENT_AMBULATORY_CARE_PROVIDER_SITE_OTHER): Payer: Medicare Other | Admitting: Cardiology

## 2022-02-07 VITALS — BP 138/84 | HR 65 | Ht 77.0 in | Wt 336.0 lb

## 2022-02-07 DIAGNOSIS — Z992 Dependence on renal dialysis: Secondary | ICD-10-CM | POA: Diagnosis not present

## 2022-02-07 DIAGNOSIS — Z79899 Other long term (current) drug therapy: Secondary | ICD-10-CM | POA: Diagnosis not present

## 2022-02-07 DIAGNOSIS — I4819 Other persistent atrial fibrillation: Secondary | ICD-10-CM | POA: Diagnosis not present

## 2022-02-07 DIAGNOSIS — N186 End stage renal disease: Secondary | ICD-10-CM

## 2022-02-07 DIAGNOSIS — N2581 Secondary hyperparathyroidism of renal origin: Secondary | ICD-10-CM | POA: Diagnosis not present

## 2022-02-07 DIAGNOSIS — D509 Iron deficiency anemia, unspecified: Secondary | ICD-10-CM | POA: Diagnosis not present

## 2022-02-07 DIAGNOSIS — I10 Essential (primary) hypertension: Secondary | ICD-10-CM

## 2022-02-07 DIAGNOSIS — E440 Moderate protein-calorie malnutrition: Secondary | ICD-10-CM | POA: Diagnosis not present

## 2022-02-07 NOTE — Progress Notes (Signed)
?Electrophysiology Office Note:   ? ?Date:  02/07/2022  ? ?ID:  Eric Douglas, DOB 1971/07/13, MRN [id number] ? ?PCP:  Michela Pitcher, NP  ?[facility] Cardiologist:  None  ?[place] Electrophysiologist:  None  ? ?Referring MD: Oliver Barre, PA  ? ?Chief Complaint: Atrial fibrillation ? ?History of Present Illness:   ? ?Eric Douglas is a 51 y.o. male who presents for an evaluation of atrial fibrillation at the request of Adline Peals, PA-C. Their medical history includes ESRD, hypertension.  He was first diagnosed with A-fib in January 2023 when he presented to the hospital with chest pain.  Since diagnosis his burden of atrial fibrillation has increased and he is symptomatic with fatigue and palpitations.  He has sleep apnea and has a CPAP titration coming up soon.  He went on to have a cardioversion in March 2023 but only stayed in rhythm for 24 hours. ?He tells me that since starting the amiodarone he is progressively felt better with less and less atrial fibrillation. ? ?  ?Past Medical History:  ?Diagnosis Date  ? Fibula fracture   ? ORIF 1990  ? Gout   ? non crystal proven   ? History of chickenpox   ? History of kidney disease   ? HTN (hypertension)   ? Nephropathy   ? Ig A; dx 2002. micropscopic hematuria and proteinuria  ? Tibia fracture   ? ORIF 1990  ? Varicocele   ? L testes  ? Venous incompetence   ? R leg  ? ? ?Past Surgical History:  ?Procedure Laterality Date  ? APPENDECTOMY  1982  ? AV FISTULA PLACEMENT Left 06/27/2017  ? Procedure: LEFT ARM ARTERIOVENOUS (AV) FISTULA CREATION;  Surgeon: Waynetta Sandy, MD;  Location: [facility];  Service: Vascular;  Laterality: Left;  ? CARDIOVERSION N/A 12/21/2021  ? Procedure: CARDIOVERSION;  Surgeon: Sanda Klein, MD;  [facility];  Service: Cardiovascular;  Laterality: N/A;  ? stab phlebectomy Left 12/17/2016  ? stab phlebectomy >20 incisions (left leg) by Tinnie Gens MD   ? ? ?Current Medications: ?Current Meds   ?Medication Sig  ? allopurinol (ZYLOPRIM) 100 MG tablet Take 100 mg by mouth every morning.  ? amiodarone (PACERONE) 200 MG tablet Take 1 tablet (200 mg total) by mouth 2 (two) times daily.  ? apixaban (ELIQUIS) 5 MG TABS tablet Take 1 tablet (5 mg total) by mouth 2 (two) times daily.  ? calcitRIOL (ROCALTROL) 0.25 MCG capsule Take 0.5 mcg by mouth every morning.  ? Calcium Polycarbophil (FIBERCON PO) Take 2 tablets by mouth every morning.  ? cinacalcet (SENSIPAR) 90 MG tablet Take 90 mg by mouth daily.  ? gabapentin (NEURONTIN) 300 MG capsule Take 300 mg by mouth at bedtime.  ? Methoxy PEG-Epoetin Beta (MIRCERA IJ) Inject into the skin.  ? metoprolol tartrate (LOPRESSOR) 50 MG tablet Take 1 tablet (50 mg total) by mouth 2 (two) times daily. May take extra tablet for breakthrough afib  ? mupirocin ointment (BACTROBAN) 2 % Apply 1 application topically daily as needed (port care).  ? sucroferric oxyhydroxide (VELPHORO) 500 MG chewable tablet Chew 500-1,000 mg by mouth See admin instructions. Chew 2 tablets (1000 mg) by mouth once or twice daily with meals, 500 mg with snacks  ? tamsulosin (FLOMAX) 0.4 MG CAPS capsule TAKE 1 CAPSULE BY MOUTH EVERY DAY  ? testosterone cypionate (DEPOTESTOSTERONE CYPIONATE) 200 MG/ML injection INJECT 1 ML (200 MG TOTAL) INTO THE MUSCLE EVERY 14 (FOURTEEN) DAYS. MUST SCHEDULE ANNUAL  EXAM  ?  ? ?Allergies:   Patient has no known allergies.  ? ?Social History  ? ?Socioeconomic History  ? Marital status: Divorced  ?  Spouse name: Not on file  ? Number of children: 2  ? Years of education: Not on file  ? Highest education level: Not on file  ?Occupational History  ? Occupation: Hydrologist  ?  Comment: [employer]  ?Tobacco Use  ? Smoking status: Never  ? Smokeless tobacco: Never  ? Tobacco comments:  ?  Never smoke 12/28/21  ?Vaping Use  ? Vaping Use: Never used  ?Substance and Sexual Activity  ? Alcohol use: No  ?  Alcohol/week: 0.0 standard drinks  ? Drug use: No  ? Sexual  activity: Yes  ?Other Topics Concern  ? Not on file  ?Social History Narrative  ? 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   ?   ? Pt signed designated party release and gives no access to medical records. Can leave msg on cell [phone].   ? ?Social Determinants of Health  ? ?Financial Resource Strain: Low Risk   ? Difficulty of Paying Living Expenses: Not hard at all  ?Food Insecurity: No Food Insecurity  ? Worried About Charity fundraiser in the Last Year: Never true  ? Ran Out of Food in the Last Year: Never true  ?Transportation Needs: Not on file  ?Physical Activity: Not on file  ?Stress: Not on file  ?Social Connections: Not on file  ?  ? ?Family History: ?The patient's family history includes Cancer (age of onset: 16) in his paternal grandfather; Diabetes in an other family member; Hypertension in an other family member; Stroke in an other family member. There is no history of Heart disease. ? ?ROS:   ?Please see the history of present illness.    ?All other systems reviewed and are negative. ? ?EKGs/Labs/Other Studies Reviewed:   ? ?The following studies were reviewed today: ? ? ?EKG:  The ekg ordered today demonstrates sinus rhythm with PACs. ? ? ?Recent Labs: ?07/20/2021: ALT 17 ?10/22/2021: TSH 2.628 ?12/21/2021: BUN 37; Creatinine, Ser 12.50; Hemoglobin 10.5; Platelets 120; Potassium 5.7; Sodium 137  ?Recent Lipid Panel ?   ?Component Value Date/Time  ? CHOL 116 07/20/2021 0931  ? TRIG 56.0 07/20/2021 0931  ? HDL 40.30 07/20/2021 0931  ? CHOLHDL 3 07/20/2021 0931  ? VLDL 11.2 07/20/2021 0931  ? [place] 64 07/20/2021 0931  ? ? ?Physical Exam:   ? ?VS:  BP 138/84   Pulse 65   Ht 6' 5"  (1.956 m)   Wt (!) 336 lb (152.4 kg)   SpO2 97%   BMI 39.84 kg/m?    ? ?Wt Readings from Last 3 Encounters:  ?02/07/22 (!) 336 lb (152.4 kg)  ?12/28/21 (!) 335 lb 3.2 oz (152 kg)  ?12/14/21 (!) 320 lb (145.2 kg)  ?  ? ?GEN:  Well nourished, well developed in no acute distress.   Obese ?HEENT: Normal ?NECK: No JVD; No carotid bruits ?LYMPHATICS: No lymphadenopathy ?CARDIAC: RRR, no murmurs, rubs, gallops.  Left arm HD access. ?RESPIRATORY:  Clear to auscultation without rales, wheezing or rhonchi  ?ABDOMEN: Soft, non-tender, non-distended ?MUSCULOSKELETAL:  No edema; No deformity  ?SKIN: Warm and dry ?NEUROLOGIC:  Alert and oriented x 3 ?PSYCHIATRIC:  Normal affect  ? ? ?  ? ?ASSESSMENT:   ? ?1. Persistent atrial fibrillation ([place])   ?2. Primary hypertension   ?3. ESRD (end stage renal  disease) ([place])   ?4. Encounter for long-term (current) use of high-risk medication   ? ?PLAN:   ? ?In order of problems listed above: ? ?#Persistent atrial fibrillation ?Discussed treatment options for his persistent atrial fibrillation during today's clinic appointment.  Given his obesity and ESRD status, he is not a good candidate for invasive EP procedures.  For now, recommend continuing amiodarone.  Discussed need for ongoing monitoring of liver, thyroid and lung function while on amiodarone.  We will check lab work today for this.  He will also continue Eliquis for stroke prophylaxis. ? ?#Hypertension ?Controlled ? ?#ESRD ?On home dialysis. ? ? ?Follow-up with A-fib clinic in 6 months for ongoing amiodarone monitoring. ? ? ?Medication Adjustments/Labs and Tests Ordered: ?Current medicines are reviewed at length with the patient today.  Concerns regarding medicines are outlined above.  ?Orders Placed This Encounter  ?Procedures  ? Comp Met (CMET)  ? TSH  ? T4, free  ? EKG 12-Lead  ? ?No orders of the defined types were placed in this encounter. ? ? ? ?Signed, ?Lysbeth Galas T. Quentin Ore, MD, [facility], [place] ?02/07/2022 9:25 PM    ?Electrophysiology ?[place] ?

## 2022-02-07 NOTE — Patient Instructions (Addendum)
Medication Instructions:  ?Your physician recommends that you continue on your current medications as directed. Please refer to the Current Medication list given to you today. ?*If you need a refill on your cardiac medications before your next appointment, please call your pharmacy* ? ?Lab Work: ?CMP, TSH, Free T4 ?If you have labs (blood work) drawn today and your tests are completely normal, you will receive your results only by: ?MyChart Message (if you have MyChart) OR ?A paper copy in the mail ?If you have any lab test that is abnormal or we need to change your treatment, we will call you to review the results. ? ?Testing/Procedures: ?None. ? ?Follow-Up: ?At [facility], you and your health needs are our priority.  As part of our continuing mission to provide you with exceptional heart care, we have created designated Provider Care Teams.  These Care Teams include your primary Cardiologist (physician) and Advanced Practice Providers (APPs -  Physician Assistants and Nurse Practitioners) who all work together to provide you with the care you need, when you need it. ? ?Your physician wants you to follow-up in: 6 months with the Afib Clinic. They will contact you to schedule. ? ?We recommend signing up for the patient portal called "MyChart".  Sign up information is provided on this After Visit Summary.  MyChart is used to connect with patients for Virtual Visits (Telemedicine).  Patients are able to view lab/test results, encounter notes, upcoming appointments, etc.  Non-urgent messages can be sent to your provider as well.   ?To learn more about what you can do with MyChart, go to [email or web address].   ? ?Any Other Special Instructions Will Be Listed Below (If Applicable). ? ? ? ? ?  ? ? ?

## 2022-02-08 DIAGNOSIS — D509 Iron deficiency anemia, unspecified: Secondary | ICD-10-CM | POA: Diagnosis not present

## 2022-02-08 DIAGNOSIS — Z79899 Other long term (current) drug therapy: Secondary | ICD-10-CM | POA: Diagnosis not present

## 2022-02-08 DIAGNOSIS — N2581 Secondary hyperparathyroidism of renal origin: Secondary | ICD-10-CM | POA: Diagnosis not present

## 2022-02-08 DIAGNOSIS — E440 Moderate protein-calorie malnutrition: Secondary | ICD-10-CM | POA: Diagnosis not present

## 2022-02-08 DIAGNOSIS — N186 End stage renal disease: Secondary | ICD-10-CM | POA: Diagnosis not present

## 2022-02-08 DIAGNOSIS — Z992 Dependence on renal dialysis: Secondary | ICD-10-CM | POA: Diagnosis not present

## 2022-02-08 LAB — T4, FREE: Free T4: 1.13 ng/dL (ref 0.82–1.77)

## 2022-02-08 LAB — COMPREHENSIVE METABOLIC PANEL
ALT: 29 IU/L (ref 0–44)
AST: 13 IU/L (ref 0–40)
Albumin/Globulin Ratio: 2.3 — ABNORMAL HIGH (ref 1.2–2.2)
Albumin: 4.9 g/dL (ref 3.8–4.9)
Alkaline Phosphatase: 51 IU/L (ref 44–121)
BUN/Creatinine Ratio: 5 — ABNORMAL LOW (ref 9–20)
BUN: 45 mg/dL — ABNORMAL HIGH (ref 6–24)
Bilirubin Total: 0.5 mg/dL (ref 0.0–1.2)
CO2: 24 mmol/L (ref 20–29)
Calcium: 9.4 mg/dL (ref 8.7–10.2)
Chloride: 95 mmol/L — ABNORMAL LOW (ref 96–106)
Creatinine, Ser: 8.92 mg/dL — ABNORMAL HIGH (ref 0.76–1.27)
Globulin, Total: 2.1 g/dL (ref 1.5–4.5)
Glucose: 74 mg/dL (ref 70–99)
Potassium: 4.1 mmol/L (ref 3.5–5.2)
Sodium: 136 mmol/L (ref 134–144)
Total Protein: 7 g/dL (ref 6.0–8.5)
eGFR: 7 mL/min/{1.73_m2} — ABNORMAL LOW (ref >59)

## 2022-02-08 LAB — TSH: TSH: 4.42 u[IU]/mL (ref 0.450–4.500)

## 2022-02-09 DIAGNOSIS — D509 Iron deficiency anemia, unspecified: Secondary | ICD-10-CM | POA: Diagnosis not present

## 2022-02-09 DIAGNOSIS — E440 Moderate protein-calorie malnutrition: Secondary | ICD-10-CM | POA: Diagnosis not present

## 2022-02-09 DIAGNOSIS — Z992 Dependence on renal dialysis: Secondary | ICD-10-CM | POA: Diagnosis not present

## 2022-02-09 DIAGNOSIS — N2581 Secondary hyperparathyroidism of renal origin: Secondary | ICD-10-CM | POA: Diagnosis not present

## 2022-02-09 DIAGNOSIS — Z79899 Other long term (current) drug therapy: Secondary | ICD-10-CM | POA: Diagnosis not present

## 2022-02-09 DIAGNOSIS — N186 End stage renal disease: Secondary | ICD-10-CM | POA: Diagnosis not present

## 2022-02-12 DIAGNOSIS — D631 Anemia in chronic kidney disease: Secondary | ICD-10-CM | POA: Diagnosis not present

## 2022-02-12 DIAGNOSIS — I120 Hypertensive chronic kidney disease with stage 5 chronic kidney disease or end stage renal disease: Secondary | ICD-10-CM | POA: Diagnosis not present

## 2022-02-12 DIAGNOSIS — D509 Iron deficiency anemia, unspecified: Secondary | ICD-10-CM | POA: Diagnosis not present

## 2022-02-12 DIAGNOSIS — N028 Recurrent and persistent hematuria with other morphologic changes: Secondary | ICD-10-CM | POA: Diagnosis not present

## 2022-02-12 DIAGNOSIS — Z79899 Other long term (current) drug therapy: Secondary | ICD-10-CM | POA: Diagnosis not present

## 2022-02-12 DIAGNOSIS — Z992 Dependence on renal dialysis: Secondary | ICD-10-CM | POA: Diagnosis not present

## 2022-02-12 DIAGNOSIS — E440 Moderate protein-calorie malnutrition: Secondary | ICD-10-CM | POA: Diagnosis not present

## 2022-02-12 DIAGNOSIS — N186 End stage renal disease: Secondary | ICD-10-CM | POA: Diagnosis not present

## 2022-02-12 DIAGNOSIS — N2581 Secondary hyperparathyroidism of renal origin: Secondary | ICD-10-CM | POA: Diagnosis not present

## 2022-02-13 DIAGNOSIS — D509 Iron deficiency anemia, unspecified: Secondary | ICD-10-CM | POA: Diagnosis not present

## 2022-02-13 DIAGNOSIS — D631 Anemia in chronic kidney disease: Secondary | ICD-10-CM | POA: Diagnosis not present

## 2022-02-13 DIAGNOSIS — N2581 Secondary hyperparathyroidism of renal origin: Secondary | ICD-10-CM | POA: Diagnosis not present

## 2022-02-13 DIAGNOSIS — Z992 Dependence on renal dialysis: Secondary | ICD-10-CM | POA: Diagnosis not present

## 2022-02-13 DIAGNOSIS — N186 End stage renal disease: Secondary | ICD-10-CM | POA: Diagnosis not present

## 2022-02-14 DIAGNOSIS — D631 Anemia in chronic kidney disease: Secondary | ICD-10-CM | POA: Diagnosis not present

## 2022-02-14 DIAGNOSIS — N2581 Secondary hyperparathyroidism of renal origin: Secondary | ICD-10-CM | POA: Diagnosis not present

## 2022-02-14 DIAGNOSIS — D509 Iron deficiency anemia, unspecified: Secondary | ICD-10-CM | POA: Diagnosis not present

## 2022-02-14 DIAGNOSIS — N186 End stage renal disease: Secondary | ICD-10-CM | POA: Diagnosis not present

## 2022-02-14 DIAGNOSIS — Z992 Dependence on renal dialysis: Secondary | ICD-10-CM | POA: Diagnosis not present

## 2022-02-15 DIAGNOSIS — N2581 Secondary hyperparathyroidism of renal origin: Secondary | ICD-10-CM | POA: Diagnosis not present

## 2022-02-15 DIAGNOSIS — Z992 Dependence on renal dialysis: Secondary | ICD-10-CM | POA: Diagnosis not present

## 2022-02-15 DIAGNOSIS — N186 End stage renal disease: Secondary | ICD-10-CM | POA: Diagnosis not present

## 2022-02-15 DIAGNOSIS — D631 Anemia in chronic kidney disease: Secondary | ICD-10-CM | POA: Diagnosis not present

## 2022-02-15 DIAGNOSIS — D509 Iron deficiency anemia, unspecified: Secondary | ICD-10-CM | POA: Diagnosis not present

## 2022-02-16 DIAGNOSIS — D631 Anemia in chronic kidney disease: Secondary | ICD-10-CM | POA: Diagnosis not present

## 2022-02-16 DIAGNOSIS — N2581 Secondary hyperparathyroidism of renal origin: Secondary | ICD-10-CM | POA: Diagnosis not present

## 2022-02-16 DIAGNOSIS — D509 Iron deficiency anemia, unspecified: Secondary | ICD-10-CM | POA: Diagnosis not present

## 2022-02-16 DIAGNOSIS — Z992 Dependence on renal dialysis: Secondary | ICD-10-CM | POA: Diagnosis not present

## 2022-02-16 DIAGNOSIS — N186 End stage renal disease: Secondary | ICD-10-CM | POA: Diagnosis not present

## 2022-02-19 ENCOUNTER — Telehealth: Payer: Self-pay

## 2022-02-19 DIAGNOSIS — M1040 Other secondary gout, unspecified site: Secondary | ICD-10-CM | POA: Diagnosis not present

## 2022-02-19 DIAGNOSIS — D631 Anemia in chronic kidney disease: Secondary | ICD-10-CM | POA: Diagnosis not present

## 2022-02-19 DIAGNOSIS — N186 End stage renal disease: Secondary | ICD-10-CM | POA: Diagnosis not present

## 2022-02-19 DIAGNOSIS — Z7682 Awaiting organ transplant status: Secondary | ICD-10-CM | POA: Diagnosis not present

## 2022-02-19 DIAGNOSIS — N2581 Secondary hyperparathyroidism of renal origin: Secondary | ICD-10-CM | POA: Diagnosis not present

## 2022-02-19 DIAGNOSIS — D509 Iron deficiency anemia, unspecified: Secondary | ICD-10-CM | POA: Diagnosis not present

## 2022-02-19 DIAGNOSIS — Z992 Dependence on renal dialysis: Secondary | ICD-10-CM | POA: Diagnosis not present

## 2022-02-19 NOTE — Progress Notes (Signed)
Chronic Care Management Pharmacy Assistant   Name: Eric Douglas  MRN: [id number] DOB: [birth year]  Reason for Encounter: CCM (Hypertension Disease State)   Recent office visits:  None since last CCM contact  Recent consult visits:  01/2622 Lars Mage, MD (Cardiology): Persistent Atrial Fibrillation. No med changes.  12/28/21 Clint Fenton, PA (Cardiology): Persistent Atrial Fibrillation. EKG. Change: decrease Metoprolol 50 mg BID vs. 75 mg 12/921 [place] Procedure: Cardioversion 12/14/2021 Sleep Study - Result: Severe obstructive sleep apnea adequately treated with CPAP therapy CPAP order placed.  12/12/21 Cardiology Telephone: Change due to heart rate now 107-117 and dizziness: Metoprolol 75 mg BID vs. 50 mg.  12/06/21 Cardiology Telephone: Change due to heart rate in 50s and dizziness: Metoprolol 50 mg BID vs. 100 mg.  11/29/21 Malka So, PA (Cardiology): Persistent Atrial Fibrillation. Referral to Cardiac Electrophysiology. EKG. Change: Increase Eliquis 5 mg BID.   Hospital visits:  None since last CCM contact  Medications: Outpatient Encounter Medications as of 02/19/2022  Medication Sig   allopurinol (ZYLOPRIM) 100 MG tablet Take 100 mg by mouth every morning.   amiodarone (PACERONE) 200 MG tablet Take 1 tablet (200 mg total) by mouth 2 (two) times daily.   apixaban (ELIQUIS) 5 MG TABS tablet Take 1 tablet (5 mg total) by mouth 2 (two) times daily.   calcitRIOL (ROCALTROL) 0.25 MCG capsule Take 0.5 mcg by mouth every morning.   Calcium Polycarbophil (FIBERCON PO) Take 2 tablets by mouth every morning.   cinacalcet (SENSIPAR) 90 MG tablet Take 90 mg by mouth daily.   gabapentin (NEURONTIN) 300 MG capsule Take 300 mg by mouth at bedtime.   Methoxy PEG-Epoetin Beta (MIRCERA IJ) Inject into the skin.   metoprolol tartrate (LOPRESSOR) 50 MG tablet Take 1 tablet (50 mg total) by mouth 2 (two) times daily. May take extra tablet for breakthrough afib   mupirocin  ointment (BACTROBAN) 2 % Apply 1 application topically daily as needed (port care).   sucroferric oxyhydroxide (VELPHORO) 500 MG chewable tablet Chew 500-1,000 mg by mouth See admin instructions. Chew 2 tablets (1000 mg) by mouth once or twice daily with meals, 500 mg with snacks   tamsulosin (FLOMAX) 0.4 MG CAPS capsule TAKE 1 CAPSULE BY MOUTH EVERY DAY   testosterone cypionate (DEPOTESTOSTERONE CYPIONATE) 200 MG/ML injection INJECT 1 ML (200 MG TOTAL) INTO THE MUSCLE EVERY 14 (FOURTEEN) DAYS. MUST SCHEDULE ANNUAL EXAM   No facility-administered encounter medications on file as of 02/19/2022.   Recent Office Vitals: BP Readings from Last 3 Encounters:  02/07/22 138/84  12/28/21 132/88  12/21/21 (!) 102/59   Pulse Readings from Last 3 Encounters:  02/07/22 65  12/28/21 98  12/21/21 (!) 57    Wt Readings from Last 3 Encounters:  02/07/22 (!) 336 lb (152.4 kg)  12/28/21 (!) 335 lb 3.2 oz (152 kg)  12/14/21 (!) 320 lb (145.2 kg)    Kidney Function Lab Results  Component Value Date/Time   CREATININE 8.92 (H) 02/07/2022 03:10 PM   CREATININE 12.50 (H) 12/21/2021 10:30 AM   GFR 4.81 (LL) 07/20/2021 09:31 AM   GFRNONAA 5 (L) 12/21/2021 09:41 AM   GFRAA 3 (L) 10/27/2019 11:39 AM      Latest Ref Rng & Units 02/07/2022    3:10 PM 12/21/2021   10:30 AM 12/21/2021    9:41 AM  BMP  Glucose 70 - 99 mg/dL 74   79   93    BUN 6 - 24 mg/dL 45   37  39    Creatinine 0.76 - 1.27 mg/dL 8.92   12.50   10.99    BUN/Creat Ratio 9 - 20 5      Sodium 134 - 144 mmol/L 136   137   139    Potassium 3.5 - 5.2 mmol/L 4.1   5.7   6.0    Chloride 96 - 106 mmol/L 95   104   105    CO2 20 - 29 mmol/L 24    26    Calcium 8.7 - 10.2 mg/dL 9.4    9.1     Contacted patient on 02/20/2022 to discuss hypertension disease state   Current antihypertensive regimen:  Metoprolol tartrate 50 mg - 2 tab BID  Patient verbally confirms he is taking the above medications as directed. Yes  How often are you checking  your Blood Pressure? daily Patient stated he went to the [place] yesterday and his blood pressure was 138/74; log was not available at time of call.   Wrist or arm cuff: Wrist Caffeine intake: Patient only drinks decaffeinated beverages.  Salt intake: Patient watches his salt intake.  Over the counter medications including pseudoephedrine or NSAIDs? No  Any readings above 180/120? No  What recent interventions/DTPs have been made by any provider to improve Blood Pressure control since last CPP Visit: Change: decrease Metoprolol 50 mg BID vs. 75 mg  Any recent hospitalizations or ED visits since last visit with CPP? No  What diet changes have been made to improve Blood Pressure Control?  Patient does watch what he eats.   What exercise is being done to improve your Blood Pressure Control?  Patient does not exercise due to dialysis.  Adherence Review: Is the patient currently on ACE/ARB medication? No Does the patient have >5 day gap between last estimated fill dates? N/A  Star Rating Drugs:  Medication:  Last Fill: Day Supply No star rating drugs  Care Gaps: Annual wellness visit in last year? Yes 07/20/2021 Most Recent BP reading: 138/84 on 02/07/2022  Upcoming appointments: CCM appointment on 05/22/2022

## 2022-02-20 DIAGNOSIS — Z992 Dependence on renal dialysis: Secondary | ICD-10-CM | POA: Diagnosis not present

## 2022-02-20 DIAGNOSIS — D631 Anemia in chronic kidney disease: Secondary | ICD-10-CM | POA: Diagnosis not present

## 2022-02-20 DIAGNOSIS — D509 Iron deficiency anemia, unspecified: Secondary | ICD-10-CM | POA: Diagnosis not present

## 2022-02-20 DIAGNOSIS — N186 End stage renal disease: Secondary | ICD-10-CM | POA: Diagnosis not present

## 2022-02-20 DIAGNOSIS — N2581 Secondary hyperparathyroidism of renal origin: Secondary | ICD-10-CM | POA: Diagnosis not present

## 2022-02-21 DIAGNOSIS — Z992 Dependence on renal dialysis: Secondary | ICD-10-CM | POA: Diagnosis not present

## 2022-02-21 DIAGNOSIS — N186 End stage renal disease: Secondary | ICD-10-CM | POA: Diagnosis not present

## 2022-02-21 DIAGNOSIS — D509 Iron deficiency anemia, unspecified: Secondary | ICD-10-CM | POA: Diagnosis not present

## 2022-02-21 DIAGNOSIS — N2581 Secondary hyperparathyroidism of renal origin: Secondary | ICD-10-CM | POA: Diagnosis not present

## 2022-02-21 DIAGNOSIS — D631 Anemia in chronic kidney disease: Secondary | ICD-10-CM | POA: Diagnosis not present

## 2022-02-22 DIAGNOSIS — Z992 Dependence on renal dialysis: Secondary | ICD-10-CM | POA: Diagnosis not present

## 2022-02-22 DIAGNOSIS — D631 Anemia in chronic kidney disease: Secondary | ICD-10-CM | POA: Diagnosis not present

## 2022-02-22 DIAGNOSIS — N2581 Secondary hyperparathyroidism of renal origin: Secondary | ICD-10-CM | POA: Diagnosis not present

## 2022-02-22 DIAGNOSIS — N186 End stage renal disease: Secondary | ICD-10-CM | POA: Diagnosis not present

## 2022-02-22 DIAGNOSIS — D509 Iron deficiency anemia, unspecified: Secondary | ICD-10-CM | POA: Diagnosis not present

## 2022-02-23 DIAGNOSIS — D509 Iron deficiency anemia, unspecified: Secondary | ICD-10-CM | POA: Diagnosis not present

## 2022-02-23 DIAGNOSIS — N2581 Secondary hyperparathyroidism of renal origin: Secondary | ICD-10-CM | POA: Diagnosis not present

## 2022-02-23 DIAGNOSIS — Z992 Dependence on renal dialysis: Secondary | ICD-10-CM | POA: Diagnosis not present

## 2022-02-23 DIAGNOSIS — N186 End stage renal disease: Secondary | ICD-10-CM | POA: Diagnosis not present

## 2022-02-23 DIAGNOSIS — D631 Anemia in chronic kidney disease: Secondary | ICD-10-CM | POA: Diagnosis not present

## 2022-02-26 DIAGNOSIS — N2581 Secondary hyperparathyroidism of renal origin: Secondary | ICD-10-CM | POA: Diagnosis not present

## 2022-02-26 DIAGNOSIS — N186 End stage renal disease: Secondary | ICD-10-CM | POA: Diagnosis not present

## 2022-02-26 DIAGNOSIS — D509 Iron deficiency anemia, unspecified: Secondary | ICD-10-CM | POA: Diagnosis not present

## 2022-02-26 DIAGNOSIS — D631 Anemia in chronic kidney disease: Secondary | ICD-10-CM | POA: Diagnosis not present

## 2022-02-26 DIAGNOSIS — Z992 Dependence on renal dialysis: Secondary | ICD-10-CM | POA: Diagnosis not present

## 2022-02-27 DIAGNOSIS — N186 End stage renal disease: Secondary | ICD-10-CM | POA: Diagnosis not present

## 2022-02-27 DIAGNOSIS — D631 Anemia in chronic kidney disease: Secondary | ICD-10-CM | POA: Diagnosis not present

## 2022-02-27 DIAGNOSIS — D509 Iron deficiency anemia, unspecified: Secondary | ICD-10-CM | POA: Diagnosis not present

## 2022-02-27 DIAGNOSIS — Z992 Dependence on renal dialysis: Secondary | ICD-10-CM | POA: Diagnosis not present

## 2022-02-27 DIAGNOSIS — N2581 Secondary hyperparathyroidism of renal origin: Secondary | ICD-10-CM | POA: Diagnosis not present

## 2022-02-28 DIAGNOSIS — N186 End stage renal disease: Secondary | ICD-10-CM | POA: Diagnosis not present

## 2022-02-28 DIAGNOSIS — D509 Iron deficiency anemia, unspecified: Secondary | ICD-10-CM | POA: Diagnosis not present

## 2022-02-28 DIAGNOSIS — Z992 Dependence on renal dialysis: Secondary | ICD-10-CM | POA: Diagnosis not present

## 2022-02-28 DIAGNOSIS — N2581 Secondary hyperparathyroidism of renal origin: Secondary | ICD-10-CM | POA: Diagnosis not present

## 2022-02-28 DIAGNOSIS — D631 Anemia in chronic kidney disease: Secondary | ICD-10-CM | POA: Diagnosis not present

## 2022-03-01 DIAGNOSIS — D509 Iron deficiency anemia, unspecified: Secondary | ICD-10-CM | POA: Diagnosis not present

## 2022-03-01 DIAGNOSIS — Z992 Dependence on renal dialysis: Secondary | ICD-10-CM | POA: Diagnosis not present

## 2022-03-01 DIAGNOSIS — N186 End stage renal disease: Secondary | ICD-10-CM | POA: Diagnosis not present

## 2022-03-01 DIAGNOSIS — N2581 Secondary hyperparathyroidism of renal origin: Secondary | ICD-10-CM | POA: Diagnosis not present

## 2022-03-01 DIAGNOSIS — D631 Anemia in chronic kidney disease: Secondary | ICD-10-CM | POA: Diagnosis not present

## 2022-03-02 DIAGNOSIS — D631 Anemia in chronic kidney disease: Secondary | ICD-10-CM | POA: Diagnosis not present

## 2022-03-02 DIAGNOSIS — D509 Iron deficiency anemia, unspecified: Secondary | ICD-10-CM | POA: Diagnosis not present

## 2022-03-02 DIAGNOSIS — Z992 Dependence on renal dialysis: Secondary | ICD-10-CM | POA: Diagnosis not present

## 2022-03-02 DIAGNOSIS — N186 End stage renal disease: Secondary | ICD-10-CM | POA: Diagnosis not present

## 2022-03-02 DIAGNOSIS — N2581 Secondary hyperparathyroidism of renal origin: Secondary | ICD-10-CM | POA: Diagnosis not present

## 2022-03-05 DIAGNOSIS — D509 Iron deficiency anemia, unspecified: Secondary | ICD-10-CM | POA: Diagnosis not present

## 2022-03-05 DIAGNOSIS — N186 End stage renal disease: Secondary | ICD-10-CM | POA: Diagnosis not present

## 2022-03-05 DIAGNOSIS — E291 Testicular hypofunction: Secondary | ICD-10-CM | POA: Diagnosis not present

## 2022-03-05 DIAGNOSIS — D631 Anemia in chronic kidney disease: Secondary | ICD-10-CM | POA: Diagnosis not present

## 2022-03-05 DIAGNOSIS — Z992 Dependence on renal dialysis: Secondary | ICD-10-CM | POA: Diagnosis not present

## 2022-03-05 DIAGNOSIS — N2581 Secondary hyperparathyroidism of renal origin: Secondary | ICD-10-CM | POA: Diagnosis not present

## 2022-03-06 DIAGNOSIS — N2581 Secondary hyperparathyroidism of renal origin: Secondary | ICD-10-CM | POA: Diagnosis not present

## 2022-03-06 DIAGNOSIS — N186 End stage renal disease: Secondary | ICD-10-CM | POA: Diagnosis not present

## 2022-03-06 DIAGNOSIS — D509 Iron deficiency anemia, unspecified: Secondary | ICD-10-CM | POA: Diagnosis not present

## 2022-03-06 DIAGNOSIS — Z992 Dependence on renal dialysis: Secondary | ICD-10-CM | POA: Diagnosis not present

## 2022-03-06 DIAGNOSIS — D631 Anemia in chronic kidney disease: Secondary | ICD-10-CM | POA: Diagnosis not present

## 2022-03-07 DIAGNOSIS — D509 Iron deficiency anemia, unspecified: Secondary | ICD-10-CM | POA: Diagnosis not present

## 2022-03-07 DIAGNOSIS — N2581 Secondary hyperparathyroidism of renal origin: Secondary | ICD-10-CM | POA: Diagnosis not present

## 2022-03-07 DIAGNOSIS — N186 End stage renal disease: Secondary | ICD-10-CM | POA: Diagnosis not present

## 2022-03-07 DIAGNOSIS — Z992 Dependence on renal dialysis: Secondary | ICD-10-CM | POA: Diagnosis not present

## 2022-03-07 DIAGNOSIS — D631 Anemia in chronic kidney disease: Secondary | ICD-10-CM | POA: Diagnosis not present

## 2022-03-08 DIAGNOSIS — N186 End stage renal disease: Secondary | ICD-10-CM | POA: Diagnosis not present

## 2022-03-08 DIAGNOSIS — N2581 Secondary hyperparathyroidism of renal origin: Secondary | ICD-10-CM | POA: Diagnosis not present

## 2022-03-08 DIAGNOSIS — D631 Anemia in chronic kidney disease: Secondary | ICD-10-CM | POA: Diagnosis not present

## 2022-03-08 DIAGNOSIS — Z992 Dependence on renal dialysis: Secondary | ICD-10-CM | POA: Diagnosis not present

## 2022-03-08 DIAGNOSIS — D509 Iron deficiency anemia, unspecified: Secondary | ICD-10-CM | POA: Diagnosis not present

## 2022-03-09 DIAGNOSIS — M1711 Unilateral primary osteoarthritis, right knee: Secondary | ICD-10-CM | POA: Insufficient documentation

## 2022-03-09 DIAGNOSIS — D509 Iron deficiency anemia, unspecified: Secondary | ICD-10-CM | POA: Diagnosis not present

## 2022-03-09 DIAGNOSIS — N186 End stage renal disease: Secondary | ICD-10-CM | POA: Diagnosis not present

## 2022-03-09 DIAGNOSIS — D631 Anemia in chronic kidney disease: Secondary | ICD-10-CM | POA: Diagnosis not present

## 2022-03-09 DIAGNOSIS — N2581 Secondary hyperparathyroidism of renal origin: Secondary | ICD-10-CM | POA: Diagnosis not present

## 2022-03-09 DIAGNOSIS — Z992 Dependence on renal dialysis: Secondary | ICD-10-CM | POA: Diagnosis not present

## 2022-03-12 DIAGNOSIS — N186 End stage renal disease: Secondary | ICD-10-CM | POA: Diagnosis not present

## 2022-03-12 DIAGNOSIS — Z992 Dependence on renal dialysis: Secondary | ICD-10-CM | POA: Diagnosis not present

## 2022-03-12 DIAGNOSIS — D631 Anemia in chronic kidney disease: Secondary | ICD-10-CM | POA: Diagnosis not present

## 2022-03-12 DIAGNOSIS — D509 Iron deficiency anemia, unspecified: Secondary | ICD-10-CM | POA: Diagnosis not present

## 2022-03-12 DIAGNOSIS — N2581 Secondary hyperparathyroidism of renal origin: Secondary | ICD-10-CM | POA: Diagnosis not present

## 2022-03-13 DIAGNOSIS — N2581 Secondary hyperparathyroidism of renal origin: Secondary | ICD-10-CM | POA: Diagnosis not present

## 2022-03-13 DIAGNOSIS — N186 End stage renal disease: Secondary | ICD-10-CM | POA: Diagnosis not present

## 2022-03-13 DIAGNOSIS — Z992 Dependence on renal dialysis: Secondary | ICD-10-CM | POA: Diagnosis not present

## 2022-03-13 DIAGNOSIS — D509 Iron deficiency anemia, unspecified: Secondary | ICD-10-CM | POA: Diagnosis not present

## 2022-03-13 DIAGNOSIS — D631 Anemia in chronic kidney disease: Secondary | ICD-10-CM | POA: Diagnosis not present

## 2022-03-13 DIAGNOSIS — M1711 Unilateral primary osteoarthritis, right knee: Secondary | ICD-10-CM | POA: Diagnosis not present

## 2022-03-14 DIAGNOSIS — H0288B Meibomian gland dysfunction left eye, upper and lower eyelids: Secondary | ICD-10-CM | POA: Diagnosis not present

## 2022-03-14 DIAGNOSIS — H0288A Meibomian gland dysfunction right eye, upper and lower eyelids: Secondary | ICD-10-CM | POA: Diagnosis not present

## 2022-03-14 DIAGNOSIS — N186 End stage renal disease: Secondary | ICD-10-CM | POA: Diagnosis not present

## 2022-03-14 DIAGNOSIS — N2581 Secondary hyperparathyroidism of renal origin: Secondary | ICD-10-CM | POA: Diagnosis not present

## 2022-03-14 DIAGNOSIS — H5213 Myopia, bilateral: Secondary | ICD-10-CM | POA: Diagnosis not present

## 2022-03-14 DIAGNOSIS — D509 Iron deficiency anemia, unspecified: Secondary | ICD-10-CM | POA: Diagnosis not present

## 2022-03-14 DIAGNOSIS — Z992 Dependence on renal dialysis: Secondary | ICD-10-CM | POA: Diagnosis not present

## 2022-03-14 DIAGNOSIS — D631 Anemia in chronic kidney disease: Secondary | ICD-10-CM | POA: Diagnosis not present

## 2022-03-14 DIAGNOSIS — H1849 Other corneal degeneration: Secondary | ICD-10-CM | POA: Diagnosis not present

## 2022-03-15 DIAGNOSIS — N2581 Secondary hyperparathyroidism of renal origin: Secondary | ICD-10-CM | POA: Diagnosis not present

## 2022-03-15 DIAGNOSIS — I120 Hypertensive chronic kidney disease with stage 5 chronic kidney disease or end stage renal disease: Secondary | ICD-10-CM | POA: Diagnosis not present

## 2022-03-15 DIAGNOSIS — Z992 Dependence on renal dialysis: Secondary | ICD-10-CM | POA: Diagnosis not present

## 2022-03-15 DIAGNOSIS — N028 Recurrent and persistent hematuria with other morphologic changes: Secondary | ICD-10-CM | POA: Diagnosis not present

## 2022-03-15 DIAGNOSIS — D509 Iron deficiency anemia, unspecified: Secondary | ICD-10-CM | POA: Diagnosis not present

## 2022-03-15 DIAGNOSIS — K769 Liver disease, unspecified: Secondary | ICD-10-CM | POA: Diagnosis not present

## 2022-03-15 DIAGNOSIS — N2589 Other disorders resulting from impaired renal tubular function: Secondary | ICD-10-CM | POA: Diagnosis not present

## 2022-03-15 DIAGNOSIS — D631 Anemia in chronic kidney disease: Secondary | ICD-10-CM | POA: Diagnosis not present

## 2022-03-15 DIAGNOSIS — N186 End stage renal disease: Secondary | ICD-10-CM | POA: Diagnosis not present

## 2022-03-16 DIAGNOSIS — K769 Liver disease, unspecified: Secondary | ICD-10-CM | POA: Diagnosis not present

## 2022-03-16 DIAGNOSIS — N2589 Other disorders resulting from impaired renal tubular function: Secondary | ICD-10-CM | POA: Diagnosis not present

## 2022-03-16 DIAGNOSIS — Z992 Dependence on renal dialysis: Secondary | ICD-10-CM | POA: Diagnosis not present

## 2022-03-16 DIAGNOSIS — N2581 Secondary hyperparathyroidism of renal origin: Secondary | ICD-10-CM | POA: Diagnosis not present

## 2022-03-16 DIAGNOSIS — D509 Iron deficiency anemia, unspecified: Secondary | ICD-10-CM | POA: Diagnosis not present

## 2022-03-16 DIAGNOSIS — N186 End stage renal disease: Secondary | ICD-10-CM | POA: Diagnosis not present

## 2022-03-19 DIAGNOSIS — Z992 Dependence on renal dialysis: Secondary | ICD-10-CM | POA: Diagnosis not present

## 2022-03-19 DIAGNOSIS — N2589 Other disorders resulting from impaired renal tubular function: Secondary | ICD-10-CM | POA: Diagnosis not present

## 2022-03-19 DIAGNOSIS — N186 End stage renal disease: Secondary | ICD-10-CM | POA: Diagnosis not present

## 2022-03-19 DIAGNOSIS — N2581 Secondary hyperparathyroidism of renal origin: Secondary | ICD-10-CM | POA: Diagnosis not present

## 2022-03-19 DIAGNOSIS — D509 Iron deficiency anemia, unspecified: Secondary | ICD-10-CM | POA: Diagnosis not present

## 2022-03-19 DIAGNOSIS — K769 Liver disease, unspecified: Secondary | ICD-10-CM | POA: Diagnosis not present

## 2022-03-20 DIAGNOSIS — N2589 Other disorders resulting from impaired renal tubular function: Secondary | ICD-10-CM | POA: Diagnosis not present

## 2022-03-20 DIAGNOSIS — D509 Iron deficiency anemia, unspecified: Secondary | ICD-10-CM | POA: Diagnosis not present

## 2022-03-20 DIAGNOSIS — N186 End stage renal disease: Secondary | ICD-10-CM | POA: Diagnosis not present

## 2022-03-20 DIAGNOSIS — Z992 Dependence on renal dialysis: Secondary | ICD-10-CM | POA: Diagnosis not present

## 2022-03-20 DIAGNOSIS — K769 Liver disease, unspecified: Secondary | ICD-10-CM | POA: Diagnosis not present

## 2022-03-20 DIAGNOSIS — N2581 Secondary hyperparathyroidism of renal origin: Secondary | ICD-10-CM | POA: Diagnosis not present

## 2022-03-21 DIAGNOSIS — N186 End stage renal disease: Secondary | ICD-10-CM | POA: Diagnosis not present

## 2022-03-21 DIAGNOSIS — N2581 Secondary hyperparathyroidism of renal origin: Secondary | ICD-10-CM | POA: Diagnosis not present

## 2022-03-21 DIAGNOSIS — Z992 Dependence on renal dialysis: Secondary | ICD-10-CM | POA: Diagnosis not present

## 2022-03-21 DIAGNOSIS — N2589 Other disorders resulting from impaired renal tubular function: Secondary | ICD-10-CM | POA: Diagnosis not present

## 2022-03-21 DIAGNOSIS — K769 Liver disease, unspecified: Secondary | ICD-10-CM | POA: Diagnosis not present

## 2022-03-21 DIAGNOSIS — D509 Iron deficiency anemia, unspecified: Secondary | ICD-10-CM | POA: Diagnosis not present

## 2022-03-22 DIAGNOSIS — M1040 Other secondary gout, unspecified site: Secondary | ICD-10-CM | POA: Diagnosis not present

## 2022-03-22 DIAGNOSIS — N2589 Other disorders resulting from impaired renal tubular function: Secondary | ICD-10-CM | POA: Diagnosis not present

## 2022-03-22 DIAGNOSIS — Z992 Dependence on renal dialysis: Secondary | ICD-10-CM | POA: Diagnosis not present

## 2022-03-22 DIAGNOSIS — D509 Iron deficiency anemia, unspecified: Secondary | ICD-10-CM | POA: Diagnosis not present

## 2022-03-22 DIAGNOSIS — N186 End stage renal disease: Secondary | ICD-10-CM | POA: Diagnosis not present

## 2022-03-22 DIAGNOSIS — N2581 Secondary hyperparathyroidism of renal origin: Secondary | ICD-10-CM | POA: Diagnosis not present

## 2022-03-22 DIAGNOSIS — K769 Liver disease, unspecified: Secondary | ICD-10-CM | POA: Diagnosis not present

## 2022-03-23 DIAGNOSIS — N2589 Other disorders resulting from impaired renal tubular function: Secondary | ICD-10-CM | POA: Diagnosis not present

## 2022-03-23 DIAGNOSIS — Z992 Dependence on renal dialysis: Secondary | ICD-10-CM | POA: Diagnosis not present

## 2022-03-23 DIAGNOSIS — N2581 Secondary hyperparathyroidism of renal origin: Secondary | ICD-10-CM | POA: Diagnosis not present

## 2022-03-23 DIAGNOSIS — D509 Iron deficiency anemia, unspecified: Secondary | ICD-10-CM | POA: Diagnosis not present

## 2022-03-23 DIAGNOSIS — K769 Liver disease, unspecified: Secondary | ICD-10-CM | POA: Diagnosis not present

## 2022-03-23 DIAGNOSIS — N186 End stage renal disease: Secondary | ICD-10-CM | POA: Diagnosis not present

## 2022-03-26 DIAGNOSIS — N2581 Secondary hyperparathyroidism of renal origin: Secondary | ICD-10-CM | POA: Diagnosis not present

## 2022-03-26 DIAGNOSIS — K769 Liver disease, unspecified: Secondary | ICD-10-CM | POA: Diagnosis not present

## 2022-03-26 DIAGNOSIS — Z992 Dependence on renal dialysis: Secondary | ICD-10-CM | POA: Diagnosis not present

## 2022-03-26 DIAGNOSIS — N2589 Other disorders resulting from impaired renal tubular function: Secondary | ICD-10-CM | POA: Diagnosis not present

## 2022-03-26 DIAGNOSIS — N186 End stage renal disease: Secondary | ICD-10-CM | POA: Diagnosis not present

## 2022-03-26 DIAGNOSIS — D509 Iron deficiency anemia, unspecified: Secondary | ICD-10-CM | POA: Diagnosis not present

## 2022-03-27 DIAGNOSIS — D509 Iron deficiency anemia, unspecified: Secondary | ICD-10-CM | POA: Diagnosis not present

## 2022-03-27 DIAGNOSIS — N2589 Other disorders resulting from impaired renal tubular function: Secondary | ICD-10-CM | POA: Diagnosis not present

## 2022-03-27 DIAGNOSIS — Z992 Dependence on renal dialysis: Secondary | ICD-10-CM | POA: Diagnosis not present

## 2022-03-27 DIAGNOSIS — N2581 Secondary hyperparathyroidism of renal origin: Secondary | ICD-10-CM | POA: Diagnosis not present

## 2022-03-27 DIAGNOSIS — K769 Liver disease, unspecified: Secondary | ICD-10-CM | POA: Diagnosis not present

## 2022-03-27 DIAGNOSIS — N186 End stage renal disease: Secondary | ICD-10-CM | POA: Diagnosis not present

## 2022-03-28 DIAGNOSIS — D509 Iron deficiency anemia, unspecified: Secondary | ICD-10-CM | POA: Diagnosis not present

## 2022-03-28 DIAGNOSIS — Z992 Dependence on renal dialysis: Secondary | ICD-10-CM | POA: Diagnosis not present

## 2022-03-28 DIAGNOSIS — N2589 Other disorders resulting from impaired renal tubular function: Secondary | ICD-10-CM | POA: Diagnosis not present

## 2022-03-28 DIAGNOSIS — N2581 Secondary hyperparathyroidism of renal origin: Secondary | ICD-10-CM | POA: Diagnosis not present

## 2022-03-28 DIAGNOSIS — N186 End stage renal disease: Secondary | ICD-10-CM | POA: Diagnosis not present

## 2022-03-28 DIAGNOSIS — K769 Liver disease, unspecified: Secondary | ICD-10-CM | POA: Diagnosis not present

## 2022-03-29 DIAGNOSIS — K769 Liver disease, unspecified: Secondary | ICD-10-CM | POA: Diagnosis not present

## 2022-03-29 DIAGNOSIS — Z992 Dependence on renal dialysis: Secondary | ICD-10-CM | POA: Diagnosis not present

## 2022-03-29 DIAGNOSIS — N186 End stage renal disease: Secondary | ICD-10-CM | POA: Diagnosis not present

## 2022-03-29 DIAGNOSIS — D509 Iron deficiency anemia, unspecified: Secondary | ICD-10-CM | POA: Diagnosis not present

## 2022-03-29 DIAGNOSIS — N2581 Secondary hyperparathyroidism of renal origin: Secondary | ICD-10-CM | POA: Diagnosis not present

## 2022-03-29 DIAGNOSIS — N2589 Other disorders resulting from impaired renal tubular function: Secondary | ICD-10-CM | POA: Diagnosis not present

## 2022-03-30 DIAGNOSIS — N2581 Secondary hyperparathyroidism of renal origin: Secondary | ICD-10-CM | POA: Diagnosis not present

## 2022-03-30 DIAGNOSIS — D509 Iron deficiency anemia, unspecified: Secondary | ICD-10-CM | POA: Diagnosis not present

## 2022-03-30 DIAGNOSIS — Z992 Dependence on renal dialysis: Secondary | ICD-10-CM | POA: Diagnosis not present

## 2022-03-30 DIAGNOSIS — N2589 Other disorders resulting from impaired renal tubular function: Secondary | ICD-10-CM | POA: Diagnosis not present

## 2022-03-30 DIAGNOSIS — K769 Liver disease, unspecified: Secondary | ICD-10-CM | POA: Diagnosis not present

## 2022-03-30 DIAGNOSIS — N186 End stage renal disease: Secondary | ICD-10-CM | POA: Diagnosis not present

## 2022-03-31 ENCOUNTER — Other Ambulatory Visit (HOSPITAL_COMMUNITY): Payer: Self-pay | Admitting: Physician Assistant

## 2022-04-02 DIAGNOSIS — N2581 Secondary hyperparathyroidism of renal origin: Secondary | ICD-10-CM | POA: Diagnosis not present

## 2022-04-02 DIAGNOSIS — K769 Liver disease, unspecified: Secondary | ICD-10-CM | POA: Diagnosis not present

## 2022-04-02 DIAGNOSIS — Z992 Dependence on renal dialysis: Secondary | ICD-10-CM | POA: Diagnosis not present

## 2022-04-02 DIAGNOSIS — D509 Iron deficiency anemia, unspecified: Secondary | ICD-10-CM | POA: Diagnosis not present

## 2022-04-02 DIAGNOSIS — N2589 Other disorders resulting from impaired renal tubular function: Secondary | ICD-10-CM | POA: Diagnosis not present

## 2022-04-02 DIAGNOSIS — N186 End stage renal disease: Secondary | ICD-10-CM | POA: Diagnosis not present

## 2022-04-03 DIAGNOSIS — D2272 Melanocytic nevi of left lower limb, including hip: Secondary | ICD-10-CM | POA: Diagnosis not present

## 2022-04-03 DIAGNOSIS — D225 Melanocytic nevi of trunk: Secondary | ICD-10-CM | POA: Diagnosis not present

## 2022-04-03 DIAGNOSIS — L814 Other melanin hyperpigmentation: Secondary | ICD-10-CM | POA: Diagnosis not present

## 2022-04-03 DIAGNOSIS — D509 Iron deficiency anemia, unspecified: Secondary | ICD-10-CM | POA: Diagnosis not present

## 2022-04-03 DIAGNOSIS — N2589 Other disorders resulting from impaired renal tubular function: Secondary | ICD-10-CM | POA: Diagnosis not present

## 2022-04-03 DIAGNOSIS — K769 Liver disease, unspecified: Secondary | ICD-10-CM | POA: Diagnosis not present

## 2022-04-03 DIAGNOSIS — D2271 Melanocytic nevi of right lower limb, including hip: Secondary | ICD-10-CM | POA: Diagnosis not present

## 2022-04-03 DIAGNOSIS — N2581 Secondary hyperparathyroidism of renal origin: Secondary | ICD-10-CM | POA: Diagnosis not present

## 2022-04-03 DIAGNOSIS — N186 End stage renal disease: Secondary | ICD-10-CM | POA: Diagnosis not present

## 2022-04-03 DIAGNOSIS — L821 Other seborrheic keratosis: Secondary | ICD-10-CM | POA: Diagnosis not present

## 2022-04-03 DIAGNOSIS — B078 Other viral warts: Secondary | ICD-10-CM | POA: Diagnosis not present

## 2022-04-03 DIAGNOSIS — Z992 Dependence on renal dialysis: Secondary | ICD-10-CM | POA: Diagnosis not present

## 2022-04-04 DIAGNOSIS — N186 End stage renal disease: Secondary | ICD-10-CM | POA: Diagnosis not present

## 2022-04-04 DIAGNOSIS — Z992 Dependence on renal dialysis: Secondary | ICD-10-CM | POA: Diagnosis not present

## 2022-04-04 DIAGNOSIS — D509 Iron deficiency anemia, unspecified: Secondary | ICD-10-CM | POA: Diagnosis not present

## 2022-04-04 DIAGNOSIS — K769 Liver disease, unspecified: Secondary | ICD-10-CM | POA: Diagnosis not present

## 2022-04-04 DIAGNOSIS — N2589 Other disorders resulting from impaired renal tubular function: Secondary | ICD-10-CM | POA: Diagnosis not present

## 2022-04-04 DIAGNOSIS — N2581 Secondary hyperparathyroidism of renal origin: Secondary | ICD-10-CM | POA: Diagnosis not present

## 2022-04-05 ENCOUNTER — Encounter: Payer: Self-pay | Admitting: Pulmonary Disease

## 2022-04-05 ENCOUNTER — Ambulatory Visit (INDEPENDENT_AMBULATORY_CARE_PROVIDER_SITE_OTHER): Payer: Medicare Other | Admitting: Pulmonary Disease

## 2022-04-05 VITALS — BP 130/80 | HR 57 | Temp 98.1°F | Ht 77.0 in | Wt 339.4 lb

## 2022-04-05 DIAGNOSIS — N2581 Secondary hyperparathyroidism of renal origin: Secondary | ICD-10-CM | POA: Diagnosis not present

## 2022-04-05 DIAGNOSIS — G4733 Obstructive sleep apnea (adult) (pediatric): Secondary | ICD-10-CM | POA: Diagnosis not present

## 2022-04-05 DIAGNOSIS — D509 Iron deficiency anemia, unspecified: Secondary | ICD-10-CM | POA: Diagnosis not present

## 2022-04-05 DIAGNOSIS — K769 Liver disease, unspecified: Secondary | ICD-10-CM | POA: Diagnosis not present

## 2022-04-05 DIAGNOSIS — Z9989 Dependence on other enabling machines and devices: Secondary | ICD-10-CM

## 2022-04-05 DIAGNOSIS — N2589 Other disorders resulting from impaired renal tubular function: Secondary | ICD-10-CM | POA: Diagnosis not present

## 2022-04-05 DIAGNOSIS — Z992 Dependence on renal dialysis: Secondary | ICD-10-CM | POA: Diagnosis not present

## 2022-04-05 DIAGNOSIS — N186 End stage renal disease: Secondary | ICD-10-CM | POA: Diagnosis not present

## 2022-04-05 MED ORDER — ESZOPICLONE 2 MG PO TABS: 2.0000 mg | ORAL_TABLET | Freq: Every evening | ORAL | 3 refills | Status: DC | PRN

## 2022-04-05 NOTE — Patient Instructions (Signed)
I will see you back in 6 months Continue using your CPAP on a nightly basis  Continue weight loss efforts  Prescription for Lunesta sent to pharmacy for you -Call to let us know if dose increase is needed

## 2022-04-05 NOTE — Progress Notes (Signed)
Vernia Buff

## 2022-04-05 NOTE — Progress Notes (Signed)
Eric Douglas    [id number]    1971-03-28  Primary Care Physician:Cable, Alyson Locket, NP  Referring Physician: Michela Pitcher, NP [address] [place],  [place] [address]  Chief complaint:   History of snoring, nonrestorative sleep Found to have severe obstructive sleep apnea  HPI:  Has been using an adjusting to CPAP on a nightly basis  Wakes up feeling like he is at a better nights rest  No morning headaches, no dryness of his mouth in the mornings  He does try remember to put it on every night  He does have multiple awakenings and sometimes not able to fall back asleep Has had problems with insomnia for a while States he usually does not have significant difficulty falling asleep but staying asleep can be challenging   Patient with end-stage renal disease on hemodialysis, 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   amiodarone (PACERONE) 200 MG tablet Take 1 tablet (200 mg total) by mouth 2 (two) times daily.   calcitRIOL (ROCALTROL) 0.25 MCG capsule Take 0.5 mcg by mouth every morning.   Calcium Polycarbophil (FIBERCON PO) Take 2 tablets by mouth every morning.   cinacalcet (SENSIPAR) 90 MG tablet Take 90 mg by mouth daily.   ELIQUIS 5 MG TABS tablet TAKE 1 TABLET BY MOUTH TWICE A DAY   eszopiclone (LUNESTA) 2 MG TABS tablet Take 1 tablet (2 mg total) by mouth at bedtime as needed for sleep. Take immediately before bedtime   gabapentin (NEURONTIN) 300 MG capsule Take 300 mg by mouth at bedtime.   Methoxy PEG-Epoetin Beta (MIRCERA IJ) Inject into the skin.   metoprolol tartrate (LOPRESSOR) 50 MG tablet Take 1  tablet (50 mg total) by mouth 2 (two) times daily. May take extra tablet for breakthrough afib   mupirocin ointment (BACTROBAN) 2 % Apply 1 application topically daily as needed (port care).   sucroferric oxyhydroxide (VELPHORO) 500 MG chewable tablet Chew 500-1,000 mg by mouth See admin instructions. Chew 2 tablets (1000 mg) by mouth once or twice daily with meals, 500 mg with snacks   tamsulosin (FLOMAX) 0.4 MG CAPS capsule TAKE 1 CAPSULE BY MOUTH EVERY DAY   testosterone cypionate (DEPOTESTOSTERONE CYPIONATE) 200 MG/ML injection INJECT 1 ML (200 MG TOTAL) INTO THE MUSCLE EVERY 14 (FOURTEEN) DAYS. MUST SCHEDULE ANNUAL EXAM   No facility-administered encounter medications on file as of 04/05/2022.    Allergies as of 04/05/2022   (No Known Allergies)    Past Medical History:  Diagnosis Date   Fibula fracture    ORIF 1990   Gout    non crystal proven    History of chickenpox    History of kidney disease    HTN (hypertension)    Nephropathy    Ig A; dx 2002. micropscopic hematuria and proteinuria   Tibia fracture    ORIF 1990   Varicocele    L testes   Venous incompetence    R leg    Past Surgical History:  Procedure Laterality Date   APPENDECTOMY  1982   AV FISTULA PLACEMENT Left 06/27/2017   Procedure: LEFT  ARM ARTERIOVENOUS (AV) FISTULA CREATION;  Surgeon: Waynetta Sandy, MD;  Location: [facility];  Service: Vascular;  Laterality: Left;   CARDIOVERSION N/A 12/21/2021   Procedure: CARDIOVERSION;  Surgeon: Sanda Klein, MD;  Location: [facility];  Service: Cardiovascular;  Laterality: N/A;   stab phlebectomy Left 12/17/2016   stab phlebectomy >20 incisions (left leg) by Tinnie Gens MD     Family History  Problem Relation Age of Onset   Cancer Paternal Grandfather 39       brain cancer   Hypertension Other        GP   Stroke Other    Diabetes Other        DM - GP    Heart disease Neg Hx     Social History   Socioeconomic History   Marital status:  Divorced    Spouse name: Not on file   Number of children: 2   Years of education: Not on file   Highest education level: Not on file  Occupational History   Occupation: Service Tech    Comment: Charity fundraiser  Tobacco Use   Smoking status: Never   Smokeless tobacco: Never   Tobacco comments:    Never smoke 12/28/21  Vaping Use   Vaping Use: Never used  Substance and Sexual Activity   Alcohol use: No    Alcohol/week: 0.0 standard drinks of alcohol   Drug use: No   Sexual activity: Yes  Other Topics Concern   Not on file  Social History Narrative   Divorced, primary custody of son; Charity fundraiser; gets regular exercise.       Pt signed designated party release and gives no access to medical records. Can leave msg on cell [phone].    Social Determinants of Health   Financial Resource Strain: Low Risk  (11/24/2021)   Overall Financial Resource Strain (CARDIA)    Difficulty of Paying Living Expenses: Not hard at all  Food Insecurity: No Food Insecurity (11/24/2021)   Hunger Vital Sign    Worried About Running Out of Food in the Last Year: Never true    Ran Out of Food in the Last Year: Never true  Transportation Needs: Not on file  Physical Activity: Not on file  Stress: Not on file  Social Connections: Not on file  Intimate Partner Violence: Not on file    Review of Systems  Constitutional:  Negative for fatigue.  Respiratory:  Positive for apnea. Negative for shortness of breath.   Psychiatric/Behavioral:  Positive for sleep disturbance.     Vitals:   04/05/22 1535  BP: 130/80  Pulse: (!) 57  Temp: 98.1 F (36.7 C)  SpO2: 97%     Physical Exam Constitutional:      Appearance: He is obese.  HENT:     Head: Normocephalic.     Nose: Nose normal.     Mouth/Throat:     Mouth: Mucous membranes are moist.     Comments: Mallampati 4, crowded oropharynx, macroglossia Eyes:     Pupils: Pupils are equal, round, and reactive to light.   Cardiovascular:     Rate and Rhythm: Normal rate and regular rhythm.     Heart sounds: No murmur heard.    No friction rub.  Pulmonary:     Effort: No respiratory distress.     Breath sounds: No stridor. No wheezing or rhonchi.  Musculoskeletal:     Cervical back: No rigidity or tenderness.  Neurological:     Mental  Status: He is alert.  Psychiatric:        Mood and Affect: Mood normal.       08/10/2021    3:00 PM  Results of the Epworth flowsheet  Sitting and reading 3  Watching TV 2  Sitting, inactive in a public place (e.g. a theatre or a meeting) 3  As a passenger in a car for an hour without a break 2  Lying down to rest in the afternoon when circumstances permit 3  Sitting and talking to someone 1  Sitting quietly after a lunch without alcohol 3  In a car, while stopped for a few minutes in traffic 2  Total score 19    Data Reviewed: No previous sleep study Echocardiogram 03/05/2017 shows pulmonary pressures of 34, normal EF  Home sleep study did reveal severe obstructive sleep apnea with an AHI of 62  Compliance data reveals 100% compliance CPAP of 15 Residual AHI of 1.4  Assessment:  Severe obstructive sleep apnea -Compliant with CPAP use -Benefiting from CPAP use  Obesity class III  Excessive daytime sleepiness  End-stage renal disease on hemodialysis   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

## 2022-04-06 DIAGNOSIS — D509 Iron deficiency anemia, unspecified: Secondary | ICD-10-CM | POA: Diagnosis not present

## 2022-04-06 DIAGNOSIS — Z992 Dependence on renal dialysis: Secondary | ICD-10-CM | POA: Diagnosis not present

## 2022-04-06 DIAGNOSIS — N2581 Secondary hyperparathyroidism of renal origin: Secondary | ICD-10-CM | POA: Diagnosis not present

## 2022-04-06 DIAGNOSIS — N2589 Other disorders resulting from impaired renal tubular function: Secondary | ICD-10-CM | POA: Diagnosis not present

## 2022-04-06 DIAGNOSIS — N186 End stage renal disease: Secondary | ICD-10-CM | POA: Diagnosis not present

## 2022-04-06 DIAGNOSIS — K769 Liver disease, unspecified: Secondary | ICD-10-CM | POA: Diagnosis not present

## 2022-04-09 DIAGNOSIS — N186 End stage renal disease: Secondary | ICD-10-CM | POA: Diagnosis not present

## 2022-04-09 DIAGNOSIS — N2581 Secondary hyperparathyroidism of renal origin: Secondary | ICD-10-CM | POA: Diagnosis not present

## 2022-04-09 DIAGNOSIS — D509 Iron deficiency anemia, unspecified: Secondary | ICD-10-CM | POA: Diagnosis not present

## 2022-04-09 DIAGNOSIS — K769 Liver disease, unspecified: Secondary | ICD-10-CM | POA: Diagnosis not present

## 2022-04-09 DIAGNOSIS — N2589 Other disorders resulting from impaired renal tubular function: Secondary | ICD-10-CM | POA: Diagnosis not present

## 2022-04-09 DIAGNOSIS — Z992 Dependence on renal dialysis: Secondary | ICD-10-CM | POA: Diagnosis not present

## 2022-04-10 DIAGNOSIS — N2581 Secondary hyperparathyroidism of renal origin: Secondary | ICD-10-CM | POA: Diagnosis not present

## 2022-04-10 DIAGNOSIS — N186 End stage renal disease: Secondary | ICD-10-CM | POA: Diagnosis not present

## 2022-04-10 DIAGNOSIS — N2589 Other disorders resulting from impaired renal tubular function: Secondary | ICD-10-CM | POA: Diagnosis not present

## 2022-04-10 DIAGNOSIS — K769 Liver disease, unspecified: Secondary | ICD-10-CM | POA: Diagnosis not present

## 2022-04-10 DIAGNOSIS — D509 Iron deficiency anemia, unspecified: Secondary | ICD-10-CM | POA: Diagnosis not present

## 2022-04-10 DIAGNOSIS — Z992 Dependence on renal dialysis: Secondary | ICD-10-CM | POA: Diagnosis not present

## 2022-04-12 DIAGNOSIS — N2589 Other disorders resulting from impaired renal tubular function: Secondary | ICD-10-CM | POA: Diagnosis not present

## 2022-04-12 DIAGNOSIS — K769 Liver disease, unspecified: Secondary | ICD-10-CM | POA: Diagnosis not present

## 2022-04-12 DIAGNOSIS — D509 Iron deficiency anemia, unspecified: Secondary | ICD-10-CM | POA: Diagnosis not present

## 2022-04-12 DIAGNOSIS — N2581 Secondary hyperparathyroidism of renal origin: Secondary | ICD-10-CM | POA: Diagnosis not present

## 2022-04-12 DIAGNOSIS — N186 End stage renal disease: Secondary | ICD-10-CM | POA: Diagnosis not present

## 2022-04-12 DIAGNOSIS — Z992 Dependence on renal dialysis: Secondary | ICD-10-CM | POA: Diagnosis not present

## 2022-04-13 DIAGNOSIS — K769 Liver disease, unspecified: Secondary | ICD-10-CM | POA: Diagnosis not present

## 2022-04-13 DIAGNOSIS — Z992 Dependence on renal dialysis: Secondary | ICD-10-CM | POA: Diagnosis not present

## 2022-04-13 DIAGNOSIS — N2589 Other disorders resulting from impaired renal tubular function: Secondary | ICD-10-CM | POA: Diagnosis not present

## 2022-04-13 DIAGNOSIS — N2581 Secondary hyperparathyroidism of renal origin: Secondary | ICD-10-CM | POA: Diagnosis not present

## 2022-04-13 DIAGNOSIS — N186 End stage renal disease: Secondary | ICD-10-CM | POA: Diagnosis not present

## 2022-04-13 DIAGNOSIS — D509 Iron deficiency anemia, unspecified: Secondary | ICD-10-CM | POA: Diagnosis not present

## 2022-04-14 DIAGNOSIS — N028 Recurrent and persistent hematuria with other morphologic changes: Secondary | ICD-10-CM | POA: Diagnosis not present

## 2022-04-14 DIAGNOSIS — N186 End stage renal disease: Secondary | ICD-10-CM | POA: Diagnosis not present

## 2022-04-14 DIAGNOSIS — Z992 Dependence on renal dialysis: Secondary | ICD-10-CM | POA: Diagnosis not present

## 2022-04-16 DIAGNOSIS — N2581 Secondary hyperparathyroidism of renal origin: Secondary | ICD-10-CM | POA: Diagnosis not present

## 2022-04-16 DIAGNOSIS — E440 Moderate protein-calorie malnutrition: Secondary | ICD-10-CM | POA: Diagnosis not present

## 2022-04-16 DIAGNOSIS — I120 Hypertensive chronic kidney disease with stage 5 chronic kidney disease or end stage renal disease: Secondary | ICD-10-CM | POA: Diagnosis not present

## 2022-04-16 DIAGNOSIS — Z992 Dependence on renal dialysis: Secondary | ICD-10-CM | POA: Diagnosis not present

## 2022-04-16 DIAGNOSIS — D509 Iron deficiency anemia, unspecified: Secondary | ICD-10-CM | POA: Diagnosis not present

## 2022-04-16 DIAGNOSIS — Z79899 Other long term (current) drug therapy: Secondary | ICD-10-CM | POA: Diagnosis not present

## 2022-04-16 DIAGNOSIS — N186 End stage renal disease: Secondary | ICD-10-CM | POA: Diagnosis not present

## 2022-04-16 DIAGNOSIS — D631 Anemia in chronic kidney disease: Secondary | ICD-10-CM | POA: Diagnosis not present

## 2022-04-17 DIAGNOSIS — N186 End stage renal disease: Secondary | ICD-10-CM | POA: Diagnosis not present

## 2022-04-17 DIAGNOSIS — D631 Anemia in chronic kidney disease: Secondary | ICD-10-CM | POA: Diagnosis not present

## 2022-04-17 DIAGNOSIS — N2581 Secondary hyperparathyroidism of renal origin: Secondary | ICD-10-CM | POA: Diagnosis not present

## 2022-04-17 DIAGNOSIS — D509 Iron deficiency anemia, unspecified: Secondary | ICD-10-CM | POA: Diagnosis not present

## 2022-04-17 DIAGNOSIS — Z992 Dependence on renal dialysis: Secondary | ICD-10-CM | POA: Diagnosis not present

## 2022-04-19 DIAGNOSIS — D509 Iron deficiency anemia, unspecified: Secondary | ICD-10-CM | POA: Diagnosis not present

## 2022-04-19 DIAGNOSIS — N186 End stage renal disease: Secondary | ICD-10-CM | POA: Diagnosis not present

## 2022-04-19 DIAGNOSIS — D631 Anemia in chronic kidney disease: Secondary | ICD-10-CM | POA: Diagnosis not present

## 2022-04-19 DIAGNOSIS — Z992 Dependence on renal dialysis: Secondary | ICD-10-CM | POA: Diagnosis not present

## 2022-04-19 DIAGNOSIS — N2581 Secondary hyperparathyroidism of renal origin: Secondary | ICD-10-CM | POA: Diagnosis not present

## 2022-04-20 DIAGNOSIS — N2581 Secondary hyperparathyroidism of renal origin: Secondary | ICD-10-CM | POA: Diagnosis not present

## 2022-04-20 DIAGNOSIS — D631 Anemia in chronic kidney disease: Secondary | ICD-10-CM | POA: Diagnosis not present

## 2022-04-20 DIAGNOSIS — N186 End stage renal disease: Secondary | ICD-10-CM | POA: Diagnosis not present

## 2022-04-20 DIAGNOSIS — D509 Iron deficiency anemia, unspecified: Secondary | ICD-10-CM | POA: Diagnosis not present

## 2022-04-20 DIAGNOSIS — Z992 Dependence on renal dialysis: Secondary | ICD-10-CM | POA: Diagnosis not present

## 2022-04-23 ENCOUNTER — Telehealth (HOSPITAL_COMMUNITY): Payer: Self-pay

## 2022-04-23 ENCOUNTER — Telehealth (HOSPITAL_COMMUNITY): Payer: Self-pay | Admitting: Physician Assistant

## 2022-04-23 DIAGNOSIS — N2581 Secondary hyperparathyroidism of renal origin: Secondary | ICD-10-CM | POA: Diagnosis not present

## 2022-04-23 DIAGNOSIS — Z992 Dependence on renal dialysis: Secondary | ICD-10-CM | POA: Diagnosis not present

## 2022-04-23 DIAGNOSIS — D631 Anemia in chronic kidney disease: Secondary | ICD-10-CM | POA: Diagnosis not present

## 2022-04-23 DIAGNOSIS — M1040 Other secondary gout, unspecified site: Secondary | ICD-10-CM | POA: Diagnosis not present

## 2022-04-23 DIAGNOSIS — N186 End stage renal disease: Secondary | ICD-10-CM | POA: Diagnosis not present

## 2022-04-23 DIAGNOSIS — D509 Iron deficiency anemia, unspecified: Secondary | ICD-10-CM | POA: Diagnosis not present

## 2022-04-23 NOTE — Telephone Encounter (Signed)
Called about lower his meds

## 2022-04-23 NOTE — Telephone Encounter (Signed)
Patient called stating that he felt sluggish no energy. Audry Pili advised that if he is taking Amiodarone '200mg'$  twice daily to decrease to '200mg'$  daily. Call back Wednesday/Thursday if he still feels sluggish.

## 2022-04-24 DIAGNOSIS — N2581 Secondary hyperparathyroidism of renal origin: Secondary | ICD-10-CM | POA: Diagnosis not present

## 2022-04-24 DIAGNOSIS — N186 End stage renal disease: Secondary | ICD-10-CM | POA: Diagnosis not present

## 2022-04-24 DIAGNOSIS — D509 Iron deficiency anemia, unspecified: Secondary | ICD-10-CM | POA: Diagnosis not present

## 2022-04-24 DIAGNOSIS — D631 Anemia in chronic kidney disease: Secondary | ICD-10-CM | POA: Diagnosis not present

## 2022-04-24 DIAGNOSIS — Z992 Dependence on renal dialysis: Secondary | ICD-10-CM | POA: Diagnosis not present

## 2022-04-26 DIAGNOSIS — D509 Iron deficiency anemia, unspecified: Secondary | ICD-10-CM | POA: Diagnosis not present

## 2022-04-26 DIAGNOSIS — D631 Anemia in chronic kidney disease: Secondary | ICD-10-CM | POA: Diagnosis not present

## 2022-04-26 DIAGNOSIS — N2581 Secondary hyperparathyroidism of renal origin: Secondary | ICD-10-CM | POA: Diagnosis not present

## 2022-04-26 DIAGNOSIS — N186 End stage renal disease: Secondary | ICD-10-CM | POA: Diagnosis not present

## 2022-04-26 DIAGNOSIS — Z992 Dependence on renal dialysis: Secondary | ICD-10-CM | POA: Diagnosis not present

## 2022-04-27 ENCOUNTER — Telehealth (HOSPITAL_COMMUNITY): Payer: Self-pay

## 2022-04-27 DIAGNOSIS — N186 End stage renal disease: Secondary | ICD-10-CM | POA: Diagnosis not present

## 2022-04-27 DIAGNOSIS — D509 Iron deficiency anemia, unspecified: Secondary | ICD-10-CM | POA: Diagnosis not present

## 2022-04-27 DIAGNOSIS — D631 Anemia in chronic kidney disease: Secondary | ICD-10-CM | POA: Diagnosis not present

## 2022-04-27 DIAGNOSIS — Z992 Dependence on renal dialysis: Secondary | ICD-10-CM | POA: Diagnosis not present

## 2022-04-27 DIAGNOSIS — N2581 Secondary hyperparathyroidism of renal origin: Secondary | ICD-10-CM | POA: Diagnosis not present

## 2022-04-27 DIAGNOSIS — Z7682 Awaiting organ transplant status: Secondary | ICD-10-CM | POA: Diagnosis not present

## 2022-04-27 MED ORDER — AMIODARONE HCL 200 MG PO TABS: 200.0000 mg | ORAL_TABLET | Freq: Every day | ORAL | 6 refills | Status: DC

## 2022-04-27 MED ORDER — METOPROLOL TARTRATE 50 MG PO TABS: 50.0000 mg | ORAL_TABLET | Freq: Every evening | ORAL | 3 refills | Status: DC

## 2022-04-27 NOTE — Telephone Encounter (Signed)
Patient states that he was told to call back and let the clinic know how he was feeling. He did decrease the Amiodarone '200mg'$  daily. He also stated that he decrease his Metoprolol '50mg'$  twice daily to 1 tablet nightly. He states overall he is feeling better.

## 2022-04-30 DIAGNOSIS — D509 Iron deficiency anemia, unspecified: Secondary | ICD-10-CM | POA: Diagnosis not present

## 2022-04-30 DIAGNOSIS — N2581 Secondary hyperparathyroidism of renal origin: Secondary | ICD-10-CM | POA: Diagnosis not present

## 2022-04-30 DIAGNOSIS — Z992 Dependence on renal dialysis: Secondary | ICD-10-CM | POA: Diagnosis not present

## 2022-04-30 DIAGNOSIS — N186 End stage renal disease: Secondary | ICD-10-CM | POA: Diagnosis not present

## 2022-04-30 DIAGNOSIS — D631 Anemia in chronic kidney disease: Secondary | ICD-10-CM | POA: Diagnosis not present

## 2022-05-01 DIAGNOSIS — D509 Iron deficiency anemia, unspecified: Secondary | ICD-10-CM | POA: Diagnosis not present

## 2022-05-01 DIAGNOSIS — N2581 Secondary hyperparathyroidism of renal origin: Secondary | ICD-10-CM | POA: Diagnosis not present

## 2022-05-01 DIAGNOSIS — Z992 Dependence on renal dialysis: Secondary | ICD-10-CM | POA: Diagnosis not present

## 2022-05-01 DIAGNOSIS — D631 Anemia in chronic kidney disease: Secondary | ICD-10-CM | POA: Diagnosis not present

## 2022-05-01 DIAGNOSIS — N186 End stage renal disease: Secondary | ICD-10-CM | POA: Diagnosis not present

## 2022-05-03 DIAGNOSIS — Z992 Dependence on renal dialysis: Secondary | ICD-10-CM | POA: Diagnosis not present

## 2022-05-03 DIAGNOSIS — D631 Anemia in chronic kidney disease: Secondary | ICD-10-CM | POA: Diagnosis not present

## 2022-05-03 DIAGNOSIS — N186 End stage renal disease: Secondary | ICD-10-CM | POA: Diagnosis not present

## 2022-05-03 DIAGNOSIS — D509 Iron deficiency anemia, unspecified: Secondary | ICD-10-CM | POA: Diagnosis not present

## 2022-05-03 DIAGNOSIS — N2581 Secondary hyperparathyroidism of renal origin: Secondary | ICD-10-CM | POA: Diagnosis not present

## 2022-05-04 DIAGNOSIS — D509 Iron deficiency anemia, unspecified: Secondary | ICD-10-CM | POA: Diagnosis not present

## 2022-05-04 DIAGNOSIS — N186 End stage renal disease: Secondary | ICD-10-CM | POA: Diagnosis not present

## 2022-05-04 DIAGNOSIS — D631 Anemia in chronic kidney disease: Secondary | ICD-10-CM | POA: Diagnosis not present

## 2022-05-04 DIAGNOSIS — Z992 Dependence on renal dialysis: Secondary | ICD-10-CM | POA: Diagnosis not present

## 2022-05-04 DIAGNOSIS — N2581 Secondary hyperparathyroidism of renal origin: Secondary | ICD-10-CM | POA: Diagnosis not present

## 2022-05-07 DIAGNOSIS — D631 Anemia in chronic kidney disease: Secondary | ICD-10-CM | POA: Diagnosis not present

## 2022-05-07 DIAGNOSIS — D509 Iron deficiency anemia, unspecified: Secondary | ICD-10-CM | POA: Diagnosis not present

## 2022-05-07 DIAGNOSIS — N186 End stage renal disease: Secondary | ICD-10-CM | POA: Diagnosis not present

## 2022-05-07 DIAGNOSIS — Z992 Dependence on renal dialysis: Secondary | ICD-10-CM | POA: Diagnosis not present

## 2022-05-07 DIAGNOSIS — N2581 Secondary hyperparathyroidism of renal origin: Secondary | ICD-10-CM | POA: Diagnosis not present

## 2022-05-08 DIAGNOSIS — N2581 Secondary hyperparathyroidism of renal origin: Secondary | ICD-10-CM | POA: Diagnosis not present

## 2022-05-08 DIAGNOSIS — D509 Iron deficiency anemia, unspecified: Secondary | ICD-10-CM | POA: Diagnosis not present

## 2022-05-08 DIAGNOSIS — N186 End stage renal disease: Secondary | ICD-10-CM | POA: Diagnosis not present

## 2022-05-08 DIAGNOSIS — Z992 Dependence on renal dialysis: Secondary | ICD-10-CM | POA: Diagnosis not present

## 2022-05-08 DIAGNOSIS — D631 Anemia in chronic kidney disease: Secondary | ICD-10-CM | POA: Diagnosis not present

## 2022-05-10 DIAGNOSIS — Z992 Dependence on renal dialysis: Secondary | ICD-10-CM | POA: Diagnosis not present

## 2022-05-10 DIAGNOSIS — D509 Iron deficiency anemia, unspecified: Secondary | ICD-10-CM | POA: Diagnosis not present

## 2022-05-10 DIAGNOSIS — N186 End stage renal disease: Secondary | ICD-10-CM | POA: Diagnosis not present

## 2022-05-10 DIAGNOSIS — N2581 Secondary hyperparathyroidism of renal origin: Secondary | ICD-10-CM | POA: Diagnosis not present

## 2022-05-10 DIAGNOSIS — D631 Anemia in chronic kidney disease: Secondary | ICD-10-CM | POA: Diagnosis not present

## 2022-05-11 DIAGNOSIS — Z992 Dependence on renal dialysis: Secondary | ICD-10-CM | POA: Diagnosis not present

## 2022-05-11 DIAGNOSIS — D631 Anemia in chronic kidney disease: Secondary | ICD-10-CM | POA: Diagnosis not present

## 2022-05-11 DIAGNOSIS — N186 End stage renal disease: Secondary | ICD-10-CM | POA: Diagnosis not present

## 2022-05-11 DIAGNOSIS — D509 Iron deficiency anemia, unspecified: Secondary | ICD-10-CM | POA: Diagnosis not present

## 2022-05-11 DIAGNOSIS — N2581 Secondary hyperparathyroidism of renal origin: Secondary | ICD-10-CM | POA: Diagnosis not present

## 2022-05-14 DIAGNOSIS — D509 Iron deficiency anemia, unspecified: Secondary | ICD-10-CM | POA: Diagnosis not present

## 2022-05-14 DIAGNOSIS — N186 End stage renal disease: Secondary | ICD-10-CM | POA: Diagnosis not present

## 2022-05-14 DIAGNOSIS — Z992 Dependence on renal dialysis: Secondary | ICD-10-CM | POA: Diagnosis not present

## 2022-05-14 DIAGNOSIS — N2581 Secondary hyperparathyroidism of renal origin: Secondary | ICD-10-CM | POA: Diagnosis not present

## 2022-05-14 DIAGNOSIS — D631 Anemia in chronic kidney disease: Secondary | ICD-10-CM | POA: Diagnosis not present

## 2022-05-15 DIAGNOSIS — D509 Iron deficiency anemia, unspecified: Secondary | ICD-10-CM | POA: Diagnosis not present

## 2022-05-15 DIAGNOSIS — N2581 Secondary hyperparathyroidism of renal origin: Secondary | ICD-10-CM | POA: Diagnosis not present

## 2022-05-15 DIAGNOSIS — Z23 Encounter for immunization: Secondary | ICD-10-CM | POA: Diagnosis not present

## 2022-05-15 DIAGNOSIS — E440 Moderate protein-calorie malnutrition: Secondary | ICD-10-CM | POA: Diagnosis not present

## 2022-05-15 DIAGNOSIS — D631 Anemia in chronic kidney disease: Secondary | ICD-10-CM | POA: Diagnosis not present

## 2022-05-15 DIAGNOSIS — Z79899 Other long term (current) drug therapy: Secondary | ICD-10-CM | POA: Diagnosis not present

## 2022-05-15 DIAGNOSIS — Z992 Dependence on renal dialysis: Secondary | ICD-10-CM | POA: Diagnosis not present

## 2022-05-15 DIAGNOSIS — N186 End stage renal disease: Secondary | ICD-10-CM | POA: Diagnosis not present

## 2022-05-15 DIAGNOSIS — N028 Recurrent and persistent hematuria with other morphologic changes: Secondary | ICD-10-CM | POA: Diagnosis not present

## 2022-05-15 DIAGNOSIS — I120 Hypertensive chronic kidney disease with stage 5 chronic kidney disease or end stage renal disease: Secondary | ICD-10-CM | POA: Diagnosis not present

## 2022-05-17 ENCOUNTER — Telehealth: Payer: Self-pay

## 2022-05-17 DIAGNOSIS — M1040 Other secondary gout, unspecified site: Secondary | ICD-10-CM | POA: Diagnosis not present

## 2022-05-17 DIAGNOSIS — N2581 Secondary hyperparathyroidism of renal origin: Secondary | ICD-10-CM | POA: Diagnosis not present

## 2022-05-17 DIAGNOSIS — N186 End stage renal disease: Secondary | ICD-10-CM | POA: Diagnosis not present

## 2022-05-17 DIAGNOSIS — D509 Iron deficiency anemia, unspecified: Secondary | ICD-10-CM | POA: Diagnosis not present

## 2022-05-17 DIAGNOSIS — Z992 Dependence on renal dialysis: Secondary | ICD-10-CM | POA: Diagnosis not present

## 2022-05-17 DIAGNOSIS — D631 Anemia in chronic kidney disease: Secondary | ICD-10-CM | POA: Diagnosis not present

## 2022-05-17 DIAGNOSIS — E440 Moderate protein-calorie malnutrition: Secondary | ICD-10-CM | POA: Diagnosis not present

## 2022-05-17 NOTE — Progress Notes (Signed)
    Chronic Care Management Pharmacy Assistant   Name: Eric Douglas  MRN: [id number] DOB: 05/04/1971  Reason for Encounter: CCM (Appointment Reminder)   Medications: Outpatient Encounter Medications as of 05/17/2022  Medication Sig   allopurinol (ZYLOPRIM) 100 MG tablet Take 100 mg by mouth every morning.   amiodarone (PACERONE) 200 MG tablet Take 1 tablet (200 mg total) by mouth daily.   calcitRIOL (ROCALTROL) 0.25 MCG capsule Take 0.5 mcg by mouth every morning.   Calcium Polycarbophil (FIBERCON PO) Take 2 tablets by mouth every morning.   cinacalcet (SENSIPAR) 90 MG tablet Take 90 mg by mouth daily.   ELIQUIS 5 MG TABS tablet TAKE 1 TABLET BY MOUTH TWICE A DAY   eszopiclone (LUNESTA) 2 MG TABS tablet Take 1 tablet (2 mg total) by mouth at bedtime as needed for sleep. Take immediately before bedtime   gabapentin (NEURONTIN) 300 MG capsule Take 300 mg by mouth at bedtime.   Methoxy PEG-Epoetin Beta (MIRCERA IJ) Inject into the skin.   metoprolol tartrate (LOPRESSOR) 50 MG tablet Take 1 tablet (50 mg total) by mouth at bedtime. May take extra tablet for breakthrough afib   mupirocin ointment (BACTROBAN) 2 % Apply 1 application topically daily as needed (port care).   sucroferric oxyhydroxide (VELPHORO) 500 MG chewable tablet Chew 500-1,000 mg by mouth See admin instructions. Chew 2 tablets (1000 mg) by mouth once or twice daily with meals, 500 mg with snacks   tamsulosin (FLOMAX) 0.4 MG CAPS capsule TAKE 1 CAPSULE BY MOUTH EVERY DAY   testosterone cypionate (DEPOTESTOSTERONE CYPIONATE) 200 MG/ML injection INJECT 1 ML (200 MG TOTAL) INTO THE MUSCLE EVERY 14 (FOURTEEN) DAYS. MUST SCHEDULE ANNUAL EXAM   No facility-administered encounter medications on file as of 05/17/2022.   [name] was contacted to remind of upcoming telephone visit with Charlene Brooke on 05/22/2022 at 11:00. Patient was reminded to have any blood glucose and blood pressure readings available for review  at appointment.   Patient confirmed appointment.  Are you having any problems with your medications? No   Do you have any concerns you like to discuss with the pharmacist? No  CCM referral has been placed prior to visit?  No   Star Rating Drugs: Medication:  Last Fill: Day Supply No star rating drugs noted  Charlene Brooke, CPP notified  Marijean Niemann, [place] Pharmacy Assistant [phone]

## 2022-05-18 DIAGNOSIS — E440 Moderate protein-calorie malnutrition: Secondary | ICD-10-CM | POA: Diagnosis not present

## 2022-05-18 DIAGNOSIS — D509 Iron deficiency anemia, unspecified: Secondary | ICD-10-CM | POA: Diagnosis not present

## 2022-05-18 DIAGNOSIS — N186 End stage renal disease: Secondary | ICD-10-CM | POA: Diagnosis not present

## 2022-05-18 DIAGNOSIS — Z992 Dependence on renal dialysis: Secondary | ICD-10-CM | POA: Diagnosis not present

## 2022-05-18 DIAGNOSIS — N2581 Secondary hyperparathyroidism of renal origin: Secondary | ICD-10-CM | POA: Diagnosis not present

## 2022-05-18 DIAGNOSIS — D631 Anemia in chronic kidney disease: Secondary | ICD-10-CM | POA: Diagnosis not present

## 2022-05-19 ENCOUNTER — Other Ambulatory Visit (HOSPITAL_COMMUNITY): Payer: Self-pay | Admitting: Physician Assistant

## 2022-05-21 DIAGNOSIS — D631 Anemia in chronic kidney disease: Secondary | ICD-10-CM | POA: Diagnosis not present

## 2022-05-21 DIAGNOSIS — N186 End stage renal disease: Secondary | ICD-10-CM | POA: Diagnosis not present

## 2022-05-21 DIAGNOSIS — D509 Iron deficiency anemia, unspecified: Secondary | ICD-10-CM | POA: Diagnosis not present

## 2022-05-21 DIAGNOSIS — Z992 Dependence on renal dialysis: Secondary | ICD-10-CM | POA: Diagnosis not present

## 2022-05-21 DIAGNOSIS — E440 Moderate protein-calorie malnutrition: Secondary | ICD-10-CM | POA: Diagnosis not present

## 2022-05-21 DIAGNOSIS — N2581 Secondary hyperparathyroidism of renal origin: Secondary | ICD-10-CM | POA: Diagnosis not present

## 2022-05-22 ENCOUNTER — Ambulatory Visit: Payer: Medicare Other | Admitting: Pharmacist

## 2022-05-22 ENCOUNTER — Telehealth: Payer: Self-pay | Admitting: Pharmacist

## 2022-05-22 DIAGNOSIS — N186 End stage renal disease: Secondary | ICD-10-CM

## 2022-05-22 DIAGNOSIS — E440 Moderate protein-calorie malnutrition: Secondary | ICD-10-CM | POA: Diagnosis not present

## 2022-05-22 DIAGNOSIS — Z992 Dependence on renal dialysis: Secondary | ICD-10-CM | POA: Diagnosis not present

## 2022-05-22 DIAGNOSIS — N2581 Secondary hyperparathyroidism of renal origin: Secondary | ICD-10-CM | POA: Diagnosis not present

## 2022-05-22 DIAGNOSIS — I151 Hypertension secondary to other renal disorders: Secondary | ICD-10-CM

## 2022-05-22 DIAGNOSIS — D509 Iron deficiency anemia, unspecified: Secondary | ICD-10-CM | POA: Diagnosis not present

## 2022-05-22 DIAGNOSIS — D631 Anemia in chronic kidney disease: Secondary | ICD-10-CM | POA: Diagnosis not present

## 2022-05-22 DIAGNOSIS — I4819 Other persistent atrial fibrillation: Secondary | ICD-10-CM

## 2022-05-22 NOTE — Telephone Encounter (Signed)
Patient returned call. See CCM encounter 

## 2022-05-22 NOTE — Telephone Encounter (Signed)
  Chronic Care Management   Outreach Note  05/22/2022 Name: Eric Douglas MRN: [id number] DOB: 1970/10/18  Referred by: Michela Pitcher, NP  Patient had a phone appointment scheduled with clinical pharmacist today.  An unsuccessful telephone outreach was attempted today. The patient was referred to the pharmacist for assistance with medications, care management and care coordination.   Patient will NOT be penalized in any way for missing a CCM appointment. The no-show fee does not apply.  If possible, a message was left to return call to: [phone] or to [facility].  Charlene Brooke, PharmD, BCACP Clinical Pharmacist [facility] at [facility] [phone]

## 2022-05-22 NOTE — Patient Instructions (Signed)
Visit Information  Phone number for Pharmacist: [phone]   Goals Addressed   None     Care Plan : [place]  Updates made by Charlton Haws, [place] since 05/22/2022 12:00 AM     Problem: Hypertension, Atrial Fibrillation, BPH, Gout, and ESRD   Priority: High     Long-Range Goal: Disease mgmt   Start Date: 11/24/2021  Expected End Date: 11/24/2022  This Visit's Progress: On track  Recent Progress: On track  Priority: High  Note:   Current Barriers:  None identified  Pharmacist Clinical Goal(s):  Patient wcontact provider office for questions/concerns as evidenced notation of same in electronic health recordill  through collaboration with PharmD and provider.   Interventions: 1:1 collaboration with Michela Pitcher, NP regarding development and update of comprehensive plan of care as evidenced by provider attestation and co-signature Inter-disciplinary care team collaboration (see longitudinal plan of care) Comprehensive medication review performed; medication list updated in electronic medical record  Hypertension (BP goal <130/80) -Controlled - BP is near goal at home - he checks every 30 min during dialysis sessions -Dx OSA 12/2021, started CPAP -Current home readings: SBP 110-130, HR 60s -Denies hypotensive/hypertensive symptoms -Current treatment: Metoprolol tartrate 50 mg daily - Appropriate, Effective, Safe, Accessible -Medications previously tried: amlodipine, benazepril, bystolic, furosemide -Educated on BP goals and benefits of medications for prevention of heart attack, stroke and kidney damage; Importance of home blood pressure monitoring; -Counseled to monitor BP at home daily -Recommended to continue current medication  Atrial Fibrillation (Goal: prevent stroke and major bleeding) -Controlled - per pt and cardiology notes -CHADSVASC: 1; New dx 10/2021; started with Afib clinic 11/22/21; DCCV 12/21/21 unsuccessful -Home BP and HR readings: HR  stable in 60s now -Current treatment: Amiodarone 200 mg daily - Appropriate, Effective, Query Safe, Accessible Metoprolol tartrate 50 mg daily - Appropriate, Effective, Safe, Accessible Eliquis 5 mg BID - Appropriate, Effective, Safe, Accessible -Medications previously tried: n/a -Counseled on increased risk of stroke due to Afib and benefits of anticoagulation for stroke prevention; importance of adherence to anticoagulant exactly as prescribed; bleeding risk associated with Eliquis and importance of self-monitoring for signs/symptoms of bleeding; seeking medical attention after a head injury or if there is blood in the urine/stool; -Recommended to continue current medication  ESRD (Goal: maintain status until transplant) -Controlled -ESRD d/t IgA nephropathy. Patient followed by Dr. Sheilah Pigeon kidney. He does hemodialysis at home 4 times weekly. States he does Sunday, Tuesday, Wednesday, Thursday. States in touch with nephrologist monthly and has labs drawn by nurse monthly. He also sees [employer] once a year for updated labs as he is on the kidney transplant list. -Current treatment  Mircera (Epoeitin-beta) inj w/ dialysis monthly Calcitriol 0.25 mcg - 2 tab daily Velphoro (sucroferric oxyhydroxyide) 500 mg - 2 tab BID w/ meals Sensipar (cinacalacet) 90 mg daily -Recommended to continue current medication  RLS / Neuropathy (Goal: manage symptoms) -Controlled - per pt report, neuropathy comes and goes and is more annoying than debilitating -Current treatment  Gabapentin 300 mg daily HS - Appropriate, Effective, Safe, Accessible -Medications previously tried: amitriptyline  -Recommended to continue current medication  Gout (Goal: prevent flares) -Controlled - last flare years ago per patient -Current treatment  Allopurinol 100 mg daily - Appropriate, Effective, Safe, Accessible -Recommended to continue current medication  BPH (Goal: manage symptoms) -Controlled - pt  has hx of BPH, however he is no longer making urine (on dialysis 4x per week), so it is unclear how tamsulosin  is still benefiting him -Current treatment  Tamsulosin 0.4 mg daily - Query Appropriate -Advised pt to discuss with urology continued use of tamsulosin now that he is no longer making urine  Hypotestosteronism (Goal: manage symptoms) -Controlled - per pt report; he splits his prescribed 200 mg q14 day dose into 100 mg q 7 days and feels well with this -Current treatment  Testosterone cypionate 200 mg/ml - 100 mg q7 days - Appropriate, Effective, Safe, Accessible -Recommended to continue current medication  Health Maintenance -Vaccine gaps: Shingrix  Patient Goals/Self-Care Activities Patient will:  - take medications as prescribed as evidenced by patient report and record review focus on medication adherence by pill box check blood pressure daily, document, and provide at future appointments -Monitor HR closely while starting amiodarone -Discuss if tamsulosin is still needed with urologist      Patient verbalizes understanding of instructions and care plan provided today and agrees to view in [place]. Active MyChart status and patient understanding of how to access instructions and care plan via MyChart confirmed with patient.    -Transition CCM to Self Care: Patient achieved CCM goals and no longer needs to be contacted as frequently. The patient has been provided with contact information for the care management team and has been advised to call with any health related questions or concerns.     Charlene Brooke, PharmD, BCACP Clinical Pharmacist [facility] at [facility] [phone]

## 2022-05-22 NOTE — Progress Notes (Signed)
Chronic Care Management Pharmacy Note  05/22/2022 Name:  Eric Douglas MRN:  [id number] DOB:  May 19, 1971  Summary: CCM Initial visit -Reviewed medications; pt affirms compliance as prescribed; he follows regularly with various specialists for Afib, ESRD and awaiting kidney transplant -Pt continues on tamsulosin despite making minimal urine in s/o ESRD on dialysis; query whether tamsulosin is actually beneficial in this setting  Recommendations/Changes made from today's visit: -Advised pt discuss continued need for tamsulosin with urologist  Plan: -Transition CCM to Self Care: Patient achieved CCM goals and no longer needs to be contacted as frequently. The patient has been provided with contact information for the care management team and has been advised to call with any health related questions or concerns.     Subjective: Eric Douglas is an 51 y.o. year old male who is a primary patient of Michela Pitcher, NP.  The CCM team was consulted for assistance with disease management and care coordination needs.    Engaged with patient by telephone for follow up visit in response to provider referral for pharmacy case management and/or care coordination services.   Consent to Services:  The patient was given information about Chronic Care Management services, agreed to services, and gave verbal consent prior to initiation of services.  Please see initial visit note for detailed documentation.   Patient Care Team: Michela Pitcher, NP as PCP - General (Pain Medicine) Corliss Parish, MD as Consulting Physician (Nephrology) Charlton Haws, [facility] as Pharmacist (Pharmacist)  Recent office visits: 07/20/2021 - Karl Ito, NP - Patient presented for Annual Wellness Visit. Labs: CMP, CBC, A1c, Lipid, TSH and DG Chest 2 view. Immunizations given: Flu. F/u 6 months.  06/08/2021 - Karl Ito, NP - Patient presented for end stage renal disease for transitions of care.  Patient is on kidney transplant list at [facility].    Recent consult visits: 04/27/22 NP Wynelle Beckmann ([facility]): remains active on transplant list.  04/23/22 Cardiology TE - c/o fatigue. Reduce amiodarone to 200 mg daily.  04/05/22 Dr Ander [facility]): f/u OSA. Rx'd Eszopiclone 2 mg HS Prn.  03/09/22 Dr Louis Meckel (Urology): testosterone level high but will continue. Tamsulosin not mentioned.  02/07/22 Dr Quentin Ore (Cardiology): f/u Afib. Not a candidate for invasive EP. Continue medical management.  12/28/21 PA Clint Fenton (Afib clinic): reduce metoprolol to 50 mg BID.  12/14/21 sleep study- severe sleep apnea. 12/12/21 Cardiology TE - HR 107-110, BP 160/110. Increase metoprolol to 75 mg BID. 12/06/21 Cardiology TE - dizziness. Reduce metoprolol to 50 mg BID. 11/29/21 PA Clint Fenton (Afib clinic): f/u - arrange for DCCV. Increase Eliquis to 5 mg BID.  11/22/21 PA Clint Fenton (Afib clinic): start amidoarone 200 mg BID, increase metoprolol to 100 mg BID. 11/10/2021 Transplant TE - Patient is approved for transplant. Patient is also financially approved for transplant.  11/03/21 cardiology event monitor - increase metoprolol to 50 mg BID 10/23/2021 - Dayna Dunn, PA - Cardiology- Orders only: cardiac event monitor.  08/10/2021 - Sherrilyn Rist, MD - Pulmonary Disease - Patient presented for obstructive sleep apnea.   Hospital visits: 12/21/21 admission [facility]): cardioversion 10/22/21 - 10/23/21 Admission [facility]) - new onset Afib. Start Eliquis 2.5 mg BID and metoprlol 25 mg BID (stopped Bystolic).  Objective:  Lab Results  Component Value Date   CREATININE 8.92 (H) 02/07/2022   BUN 45 (H) 02/07/2022   GFR 4.81 (LL) 07/20/2021   EGFR 7 (L) 02/07/2022   GFRNONAA 5 (L) 12/21/2021   GFRAA 3 (  L) 10/27/2019   NA 136 02/07/2022   K 4.1 02/07/2022   CALCIUM 9.4 02/07/2022   CO2 24 02/07/2022   GLUCOSE 74 02/07/2022    Lab Results  Component Value Date/Time   HGBA1C 5.3 07/20/2021  09:31 AM   HGBA1C 5.1 06/07/2017 08:17 AM   GFR 4.81 (LL) 07/20/2021 09:31 AM   GFR 10.37 (LL) 06/07/2017 08:17 AM    Last diabetic Eye exam: No results found for: "HMDIABEYEEXA"  Last diabetic Foot exam: No results found for: "HMDIABFOOTEX"   Lab Results  Component Value Date   CHOL 116 07/20/2021   HDL 40.30 07/20/2021   LDLCALC 64 07/20/2021   TRIG 56.0 07/20/2021   CHOLHDL 3 07/20/2021       Latest Ref Rng & Units 02/07/2022    3:10 PM 07/20/2021    9:31 AM 10/27/2019   11:39 AM  Hepatic Function  Total Protein 6.0 - 8.5 g/dL 7.0  6.3  7.0   Albumin 3.8 - 4.9 g/dL 4.9  4.3  3.7   AST 0 - 40 IU/L _0 ALT 0 - 44 IU/L _1 Alk Phosphatase 44 - 121 IU/L 51  54  45   Total Bilirubin 0.0 - 1.2 mg/dL 0.5  0.5  0.6     Lab Results  Component Value Date/Time   TSH 4.420 02/07/2022 03:10 PM   TSH 2.628 10/22/2021 04:33 PM   TSH 2.28 07/20/2021 09:31 AM   FREET4 1.13 02/07/2022 03:10 PM       Latest Ref Rng & Units 12/21/2021   10:30 AM 12/21/2021    9:41 AM 10/23/2021    1:29 AM  CBC  WBC 4.0 - 10.5 K/uL  4.8  5.4   Hemoglobin 13.0 - 17.0 g/dL 10.5  10.6  8.4   Hematocrit 39.0 - 52.0 % 31.0  32.6  25.2   Platelets 150 - 400 K/uL  120  115     No results found for: "VD25OH"  Clinical ASCVD: No  The ASCVD Risk score (Arnett DK, et al., 2019) failed to calculate for the following reasons:   The valid total cholesterol range is 130 to 320 mg/dL    CHA2DS2/VAS Stroke Risk Points  Current as of 15 minutes ago     2 >= 2 Points: High Risk  1 - 1.99 Points: Medium Risk  0 Points: Low Risk    No Change      Points Metrics  0 Has Congestive Heart Failure:  No    Current as of 15 minutes ago  0 Has Vascular Disease:  No    Current as of 15 minutes ago  1 Has Hypertension:  Yes    Current as of 15 minutes ago  0 Age:  31    Current as of 15 minutes ago  1 Has Diabetes:  Yes     Current as of 15 minutes ago  0 Had Stroke:  No  Had TIA:  No  Had  Thromboembolism:  No    Current as of 15 minutes ago  0 Male:  No    Current as of 15 minutes ago        06/08/2021    8:11 AM 07/24/2019    3:20 PM 06/05/2017    2:48 PM  Depression screen PHQ 2/9  Decreased Interest 0 0 0  Down, Depressed, Hopeless 0 0 0  PHQ - 2 Score 0 0 0  Social History   Tobacco Use  Smoking Status Never  Smokeless Tobacco Never  Tobacco Comments   Never smoke 12/28/21   BP Readings from Last 3 Encounters:  04/05/22 130/80  02/07/22 138/84  12/28/21 132/88   Pulse Readings from Last 3 Encounters:  04/05/22 (!) 57  02/07/22 65  12/28/21 98   Wt Readings from Last 3 Encounters:  04/05/22 (!) 339 lb 6.4 oz (154 kg)  02/07/22 (!) 336 lb (152.4 kg)  12/28/21 (!) 335 lb 3.2 oz (152 kg)   BMI Readings from Last 3 Encounters:  04/05/22 40.25 kg/m  02/07/22 39.84 kg/m  12/28/21 39.75 kg/m    Assessment/Interventions: Review of patient past medical history, allergies, medications, health status, including review of consultants reports, laboratory and other test data, was performed as part of comprehensive evaluation and provision of chronic care management services.   SDOH:  (Social Determinants of Health) assessments and interventions performed: Yes   SDOH Screenings   Alcohol Screen: Not on file  Depression (PHQ2-9): Low Risk  (06/08/2021)   Depression (PHQ2-9)    PHQ-2 Score: 0  Financial Resource Strain: Low Risk  (11/24/2021)   Overall Financial Resource Strain (CARDIA)    Difficulty of Paying Living Expenses: Not hard at all  Food Insecurity: No Food Insecurity (11/24/2021)   Hunger Vital Sign    Worried About Running Out of Food in the Last Year: Never true    Ran Out of Food in the Last Year: Never true  Housing: Not on file  Physical Activity: Not on file  Social Connections: Not on file  Stress: Not on file  Tobacco Use: Low Risk  (04/05/2022)   Patient History    Smoking Tobacco Use: Never    Smokeless Tobacco Use: Never     Passive Exposure: Not on file  Transportation Needs: Not on file    [place]  No Known Allergies  Medications Reviewed Today     Reviewed by Charlton Haws, [facility] (Pharmacist) on 05/22/22 at 77  Med List Status: <None>   Medication Order Taking? Sig Documenting Provider Last Dose Status Informant  allopurinol (ZYLOPRIM) 100 MG tablet [id number] Yes Take 100 mg by mouth every morning. Corliss Parish, MD Taking Active Self           Med Note Delsa Sale, [place] Mar 21, 2021  3:17 PM)    amiodarone (PACERONE) 200 MG tablet [id number] Yes Take 1 tablet (200 mg total) by mouth daily. Fenton, Clint R, PA Taking Active   calcitRIOL (ROCALTROL) 0.25 MCG capsule [id number] Yes Take 0.5 mcg by mouth every morning. Corliss Parish, MD Taking Active Self  Calcium Polycarbophil (FIBERCON PO) [id number] Yes Take 2 tablets by mouth every morning. [provider] Taking Active Self  cinacalcet (SENSIPAR) 90 MG tablet [id number] Yes Take 90 mg by mouth daily. Corliss Parish, MD Taking Active Self  ELIQUIS 5 MG TABS tablet [id number] Yes TAKE 1 TABLET BY MOUTH TWICE A DAY Fenton, Clint R, PA Taking Active   eszopiclone (LUNESTA) 2 MG TABS tablet [id number] Yes Take 1 tablet (2 mg total) by mouth at bedtime as needed for sleep. Take immediately before bedtime Olalere, Adewale A, MD Taking Active   gabapentin (NEURONTIN) 300 MG capsule [id number] Yes Take 300 mg by mouth at bedtime. Corliss Parish, MD Taking Active Self  Methoxy PEG-Epoetin Ernst Spell [facility] IJ) [id number] Yes Inject into the skin. Corliss Parish, MD Taking Active Self  metoprolol tartrate (LOPRESSOR) 50 MG tablet [id number]  Yes Take 1 tablet (50 mg total) by mouth at bedtime. May take extra tablet for breakthrough afib Fenton, Clint R, PA Taking Active   mupirocin ointment (BACTROBAN) 2 % [id number] Yes Apply 1 application topically daily as needed (port care). [provider] Taking Active Self   sucroferric oxyhydroxide (VELPHORO) 500 MG chewable tablet [id number] Yes Chew 500-1,000 mg by mouth See admin instructions. Chew 2 tablets (1000 mg) by mouth once or twice daily with meals, 500 mg with snacks Corliss Parish, MD Taking Active Self  tamsulosin (FLOMAX) 0.4 MG CAPS capsule [id number] Yes Take 0.4 mg by mouth daily. Ardis Hughs, MD  Active   testosterone cypionate (DEPOTESTOTERONE CYPIONATE) 100 MG/ML injection [id number] Yes Inject 100 mg into the muscle every 7 (seven) days. For IM use only Ardis Hughs, MD  Active             Patient Active Problem List   Diagnosis Date Noted   Typical atrial flutter ([place])    Persistent atrial fibrillation ([place])    Secondary hypercoagulable state ([place]) 11/30/2021   Chest pain 10/22/2021   Paroxysmal atrial fibrillation ([place]) 10/22/2021   Atrial fibrillation with RVR ([place]) 10/22/2021   HTN (hypertension) 10/22/2021   Mitral valve regurgitation 10/22/2021   Class 2 severe obesity due to excess calories with serious comorbidity and body mass index (BMI) of 37.0 to 37.9 in adult ([place]) 07/20/2021   DOE (dyspnea on exertion) 07/20/2021   Medicare annual wellness visit, subsequent 07/20/2021   Vertigo 03/21/2021   ESRD (end stage renal disease) ([place]) 11/12/2017   Varicose veins of left lower extremity with complications 32/44/0102   Gout 11/04/2008   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   IgA nephropathy 11/04/2008    Immunization History  Administered Date(s) Administered   Hepatitis B, adult 01/03/2018, 03/10/2018, 04/09/2018, 06/18/2018, 11/13/2019, 12/14/2019   Influenza,inj,Quad PF,6+ Mos 07/24/2019, 07/20/2021   Influenza-Unspecified 06/15/2018, 07/24/2019   PFIZER(Purple Top)SARS-COV-2 Vaccination 12/28/2019, 01/19/2020   Pneumococcal Conjugate-13 12/19/2017   Td 12/02/2008   Tdap 12/03/2018    Conditions to be addressed/monitored:  Hypertension, Atrial Fibrillation, BPH, Gout, and  ESRD  Care Plan : [place]  Updates made by Charlton Haws, [place] since 05/22/2022 12:00 AM     Problem: Hypertension, Atrial Fibrillation, BPH, Gout, and ESRD   Priority: High     Long-Range Goal: Disease mgmt   Start Date: 11/24/2021  Expected End Date: 11/24/2022  This Visit's Progress: On track  Recent Progress: On track  Priority: High  Note:   Current Barriers:  None identified  Pharmacist Clinical Goal(s):  Patient wcontact provider office for questions/concerns as evidenced notation of same in electronic health recordill  through collaboration with PharmD and provider.   Interventions: 1:1 collaboration with Michela Pitcher, NP regarding development and update of comprehensive plan of care as evidenced by provider attestation and co-signature Inter-disciplinary care team collaboration (see longitudinal plan of care) Comprehensive medication review performed; medication list updated in electronic medical record  Hypertension (BP goal <130/80) -Controlled - BP is near goal at home - he checks every 30 min during dialysis sessions -Dx OSA 12/2021, started CPAP -Current home readings: SBP 110-130, HR 60s -Denies hypotensive/hypertensive symptoms -Current treatment: Metoprolol tartrate 50 mg daily - Appropriate, Effective, Safe, Accessible -Medications previously tried: amlodipine, benazepril, bystolic, furosemide -Educated on BP goals and benefits of medications for prevention of heart attack, stroke and kidney damage; Importance of home blood pressure monitoring; -Counseled to monitor BP at  home daily -Recommended to continue current medication  Atrial Fibrillation (Goal: prevent stroke and major bleeding) -Controlled - per pt and cardiology notes -CHADSVASC: 1; New dx 10/2021; started with Afib clinic 11/22/21; DCCV 12/21/21 unsuccessful -Home BP and HR readings: HR stable in 60s now -Current treatment: Amiodarone 200 mg daily - Appropriate, Effective, Query  Safe, Accessible Metoprolol tartrate 50 mg daily - Appropriate, Effective, Safe, Accessible Eliquis 5 mg BID - Appropriate, Effective, Safe, Accessible -Medications previously tried: n/a -Counseled on increased risk of stroke due to Afib and benefits of anticoagulation for stroke prevention; importance of adherence to anticoagulant exactly as prescribed; bleeding risk associated with Eliquis and importance of self-monitoring for signs/symptoms of bleeding; seeking medical attention after a head injury or if there is blood in the urine/stool; -Recommended to continue current medication  ESRD (Goal: maintain status until transplant) -Controlled -ESRD d/t IgA nephropathy. Patient followed by Dr. Sheilah Pigeon kidney. He does hemodialysis at home 4 times weekly. States he does Sunday, Tuesday, Wednesday, Thursday. States in touch with nephrologist monthly and has labs drawn by nurse monthly. He also sees [employer] once a year for updated labs as he is on the kidney transplant list. -Current treatment  Mircera (Epoeitin-beta) inj w/ dialysis monthly Calcitriol 0.25 mcg - 2 tab daily Velphoro (sucroferric oxyhydroxyide) 500 mg - 2 tab BID w/ meals Sensipar (cinacalacet) 90 mg daily -Recommended to continue current medication  RLS / Neuropathy (Goal: manage symptoms) -Controlled - per pt report, neuropathy comes and goes and is more annoying than debilitating -Current treatment  Gabapentin 300 mg daily HS - Appropriate, Effective, Safe, Accessible -Medications previously tried: amitriptyline  -Recommended to continue current medication  Gout (Goal: prevent flares) -Controlled - last flare years ago per patient -Current treatment  Allopurinol 100 mg daily - Appropriate, Effective, Safe, Accessible -Recommended to continue current medication  BPH (Goal: manage symptoms) -Controlled - pt has hx of BPH, however he is no longer making urine (on dialysis 4x per week), so it is unclear  how tamsulosin is still benefiting him -Current treatment  Tamsulosin 0.4 mg daily - Query Appropriate -Advised pt to discuss with urology continued use of tamsulosin now that he is no longer making urine  Hypotestosteronism (Goal: manage symptoms) -Controlled - per pt report; he splits his prescribed 200 mg q14 day dose into 100 mg q 7 days and feels well with this -Current treatment  Testosterone cypionate 200 mg/ml - 100 mg q7 days - Appropriate, Effective, Safe, Accessible -Recommended to continue current medication  Health Maintenance -Vaccine gaps: Shingrix  Patient Goals/Self-Care Activities Patient will:  - take medications as prescribed as evidenced by patient report and record review focus on medication adherence by pill box check blood pressure daily, document, and provide at future appointments -Monitor HR closely while starting amiodarone -Discuss if tamsulosin is still needed with urologist       Medication Assistance: None required.  Patient affirms current coverage meets needs.  Compliance/Adherence/Medication fill history: Care Gaps: Hep C screening  Star-Rating Drugs: None  Medication Access: Within the past 30 days, how often has patient missed a dose of medication? 0 Is a pillbox or other method used to improve adherence? Yes  Factors that may affect medication adherence? no barriers identified Are meds synced by current pharmacy? No  Are meds delivered by current pharmacy? No  Does patient experience delays in picking up medications due to transportation concerns? No   [facility] Reviewed: Is patient disadvantaged to use [facility]?: Yes  Current Rx insurance plan: [employer] Name and location of Current pharmacy:  [id number] [place] [place]Phone: [phone]: [address] [address]Phone: [phone]: [phone] [facility] services reviewed with patient today?: No  Patient requests to transfer care to [facility]?: No  Reason patient declined to change pharmacies: Disadvantaged due to insurance/mail order   Care Plan and Follow Up Patient Decision:  Patient agrees to Care Plan and Follow-up.  Plan: The patient has been provided with contact information for the care management team and has been advised to call with any health related questions or concerns.   LCharlene Brooke PharmD, BCACP Clinical Pharmacist [phone]

## 2022-05-23 ENCOUNTER — Telehealth: Payer: Self-pay | Admitting: Cardiology

## 2022-05-23 DIAGNOSIS — I340 Nonrheumatic mitral (valve) insufficiency: Secondary | ICD-10-CM

## 2022-05-23 DIAGNOSIS — I4891 Unspecified atrial fibrillation: Secondary | ICD-10-CM

## 2022-05-23 DIAGNOSIS — I10 Essential (primary) hypertension: Secondary | ICD-10-CM

## 2022-05-23 NOTE — Telephone Encounter (Signed)
Patient calling to request a stress test and echo due to being on the transplant list. He says it is not urgent, because he is not due until December.

## 2022-05-24 DIAGNOSIS — D509 Iron deficiency anemia, unspecified: Secondary | ICD-10-CM | POA: Diagnosis not present

## 2022-05-24 DIAGNOSIS — D631 Anemia in chronic kidney disease: Secondary | ICD-10-CM | POA: Diagnosis not present

## 2022-05-24 DIAGNOSIS — N2581 Secondary hyperparathyroidism of renal origin: Secondary | ICD-10-CM | POA: Diagnosis not present

## 2022-05-24 DIAGNOSIS — N186 End stage renal disease: Secondary | ICD-10-CM | POA: Diagnosis not present

## 2022-05-24 DIAGNOSIS — Z992 Dependence on renal dialysis: Secondary | ICD-10-CM | POA: Diagnosis not present

## 2022-05-24 DIAGNOSIS — E440 Moderate protein-calorie malnutrition: Secondary | ICD-10-CM | POA: Diagnosis not present

## 2022-05-25 DIAGNOSIS — E440 Moderate protein-calorie malnutrition: Secondary | ICD-10-CM | POA: Diagnosis not present

## 2022-05-25 DIAGNOSIS — D631 Anemia in chronic kidney disease: Secondary | ICD-10-CM | POA: Diagnosis not present

## 2022-05-25 DIAGNOSIS — D509 Iron deficiency anemia, unspecified: Secondary | ICD-10-CM | POA: Diagnosis not present

## 2022-05-25 DIAGNOSIS — N2581 Secondary hyperparathyroidism of renal origin: Secondary | ICD-10-CM | POA: Diagnosis not present

## 2022-05-25 DIAGNOSIS — Z992 Dependence on renal dialysis: Secondary | ICD-10-CM | POA: Diagnosis not present

## 2022-05-25 DIAGNOSIS — N186 End stage renal disease: Secondary | ICD-10-CM | POA: Diagnosis not present

## 2022-05-28 DIAGNOSIS — N2581 Secondary hyperparathyroidism of renal origin: Secondary | ICD-10-CM | POA: Diagnosis not present

## 2022-05-28 DIAGNOSIS — N186 End stage renal disease: Secondary | ICD-10-CM | POA: Diagnosis not present

## 2022-05-28 DIAGNOSIS — D631 Anemia in chronic kidney disease: Secondary | ICD-10-CM | POA: Diagnosis not present

## 2022-05-28 DIAGNOSIS — Z992 Dependence on renal dialysis: Secondary | ICD-10-CM | POA: Diagnosis not present

## 2022-05-28 DIAGNOSIS — D509 Iron deficiency anemia, unspecified: Secondary | ICD-10-CM | POA: Diagnosis not present

## 2022-05-28 DIAGNOSIS — E440 Moderate protein-calorie malnutrition: Secondary | ICD-10-CM | POA: Diagnosis not present

## 2022-05-29 DIAGNOSIS — D509 Iron deficiency anemia, unspecified: Secondary | ICD-10-CM | POA: Diagnosis not present

## 2022-05-29 DIAGNOSIS — N186 End stage renal disease: Secondary | ICD-10-CM | POA: Diagnosis not present

## 2022-05-29 DIAGNOSIS — E440 Moderate protein-calorie malnutrition: Secondary | ICD-10-CM | POA: Diagnosis not present

## 2022-05-29 DIAGNOSIS — D631 Anemia in chronic kidney disease: Secondary | ICD-10-CM | POA: Diagnosis not present

## 2022-05-29 DIAGNOSIS — Z992 Dependence on renal dialysis: Secondary | ICD-10-CM | POA: Diagnosis not present

## 2022-05-29 DIAGNOSIS — N2581 Secondary hyperparathyroidism of renal origin: Secondary | ICD-10-CM | POA: Diagnosis not present

## 2022-05-29 NOTE — Telephone Encounter (Signed)
Discussed with fellow RN. Needs routine testing yearly while he is on the transplant list. Will okay with Dr. Quentin Ore prior to placing orders.

## 2022-05-30 NOTE — Telephone Encounter (Signed)
Pt aware this is okay to order and orders entered .Adonis Housekeeper

## 2022-05-31 DIAGNOSIS — E440 Moderate protein-calorie malnutrition: Secondary | ICD-10-CM | POA: Diagnosis not present

## 2022-05-31 DIAGNOSIS — D509 Iron deficiency anemia, unspecified: Secondary | ICD-10-CM | POA: Diagnosis not present

## 2022-05-31 DIAGNOSIS — Z992 Dependence on renal dialysis: Secondary | ICD-10-CM | POA: Diagnosis not present

## 2022-05-31 DIAGNOSIS — N186 End stage renal disease: Secondary | ICD-10-CM | POA: Diagnosis not present

## 2022-05-31 DIAGNOSIS — N2581 Secondary hyperparathyroidism of renal origin: Secondary | ICD-10-CM | POA: Diagnosis not present

## 2022-05-31 DIAGNOSIS — D631 Anemia in chronic kidney disease: Secondary | ICD-10-CM | POA: Diagnosis not present

## 2022-06-01 DIAGNOSIS — D631 Anemia in chronic kidney disease: Secondary | ICD-10-CM | POA: Diagnosis not present

## 2022-06-01 DIAGNOSIS — N2581 Secondary hyperparathyroidism of renal origin: Secondary | ICD-10-CM | POA: Diagnosis not present

## 2022-06-01 DIAGNOSIS — N186 End stage renal disease: Secondary | ICD-10-CM | POA: Diagnosis not present

## 2022-06-01 DIAGNOSIS — E440 Moderate protein-calorie malnutrition: Secondary | ICD-10-CM | POA: Diagnosis not present

## 2022-06-01 DIAGNOSIS — Z992 Dependence on renal dialysis: Secondary | ICD-10-CM | POA: Diagnosis not present

## 2022-06-01 DIAGNOSIS — D509 Iron deficiency anemia, unspecified: Secondary | ICD-10-CM | POA: Diagnosis not present

## 2022-06-04 DIAGNOSIS — Z992 Dependence on renal dialysis: Secondary | ICD-10-CM | POA: Diagnosis not present

## 2022-06-04 DIAGNOSIS — N186 End stage renal disease: Secondary | ICD-10-CM | POA: Diagnosis not present

## 2022-06-04 DIAGNOSIS — E440 Moderate protein-calorie malnutrition: Secondary | ICD-10-CM | POA: Diagnosis not present

## 2022-06-04 DIAGNOSIS — N2581 Secondary hyperparathyroidism of renal origin: Secondary | ICD-10-CM | POA: Diagnosis not present

## 2022-06-04 DIAGNOSIS — D509 Iron deficiency anemia, unspecified: Secondary | ICD-10-CM | POA: Diagnosis not present

## 2022-06-04 DIAGNOSIS — D631 Anemia in chronic kidney disease: Secondary | ICD-10-CM | POA: Diagnosis not present

## 2022-06-05 DIAGNOSIS — E440 Moderate protein-calorie malnutrition: Secondary | ICD-10-CM | POA: Diagnosis not present

## 2022-06-05 DIAGNOSIS — N186 End stage renal disease: Secondary | ICD-10-CM | POA: Diagnosis not present

## 2022-06-05 DIAGNOSIS — D509 Iron deficiency anemia, unspecified: Secondary | ICD-10-CM | POA: Diagnosis not present

## 2022-06-05 DIAGNOSIS — N2581 Secondary hyperparathyroidism of renal origin: Secondary | ICD-10-CM | POA: Diagnosis not present

## 2022-06-05 DIAGNOSIS — D631 Anemia in chronic kidney disease: Secondary | ICD-10-CM | POA: Diagnosis not present

## 2022-06-05 DIAGNOSIS — Z992 Dependence on renal dialysis: Secondary | ICD-10-CM | POA: Diagnosis not present

## 2022-06-07 DIAGNOSIS — N186 End stage renal disease: Secondary | ICD-10-CM | POA: Diagnosis not present

## 2022-06-07 DIAGNOSIS — D509 Iron deficiency anemia, unspecified: Secondary | ICD-10-CM | POA: Diagnosis not present

## 2022-06-07 DIAGNOSIS — E440 Moderate protein-calorie malnutrition: Secondary | ICD-10-CM | POA: Diagnosis not present

## 2022-06-07 DIAGNOSIS — D631 Anemia in chronic kidney disease: Secondary | ICD-10-CM | POA: Diagnosis not present

## 2022-06-07 DIAGNOSIS — Z992 Dependence on renal dialysis: Secondary | ICD-10-CM | POA: Diagnosis not present

## 2022-06-07 DIAGNOSIS — N2581 Secondary hyperparathyroidism of renal origin: Secondary | ICD-10-CM | POA: Diagnosis not present

## 2022-06-08 DIAGNOSIS — D509 Iron deficiency anemia, unspecified: Secondary | ICD-10-CM | POA: Diagnosis not present

## 2022-06-08 DIAGNOSIS — N2581 Secondary hyperparathyroidism of renal origin: Secondary | ICD-10-CM | POA: Diagnosis not present

## 2022-06-08 DIAGNOSIS — Z992 Dependence on renal dialysis: Secondary | ICD-10-CM | POA: Diagnosis not present

## 2022-06-08 DIAGNOSIS — E440 Moderate protein-calorie malnutrition: Secondary | ICD-10-CM | POA: Diagnosis not present

## 2022-06-08 DIAGNOSIS — D631 Anemia in chronic kidney disease: Secondary | ICD-10-CM | POA: Diagnosis not present

## 2022-06-08 DIAGNOSIS — N186 End stage renal disease: Secondary | ICD-10-CM | POA: Diagnosis not present

## 2022-06-11 ENCOUNTER — Telehealth: Payer: Self-pay | Admitting: Cardiology

## 2022-06-11 DIAGNOSIS — N2581 Secondary hyperparathyroidism of renal origin: Secondary | ICD-10-CM | POA: Diagnosis not present

## 2022-06-11 DIAGNOSIS — I4819 Other persistent atrial fibrillation: Secondary | ICD-10-CM

## 2022-06-11 DIAGNOSIS — Z7682 Awaiting organ transplant status: Secondary | ICD-10-CM

## 2022-06-11 DIAGNOSIS — D509 Iron deficiency anemia, unspecified: Secondary | ICD-10-CM | POA: Diagnosis not present

## 2022-06-11 DIAGNOSIS — D631 Anemia in chronic kidney disease: Secondary | ICD-10-CM | POA: Diagnosis not present

## 2022-06-11 DIAGNOSIS — N186 End stage renal disease: Secondary | ICD-10-CM | POA: Diagnosis not present

## 2022-06-11 DIAGNOSIS — Z992 Dependence on renal dialysis: Secondary | ICD-10-CM | POA: Diagnosis not present

## 2022-06-11 DIAGNOSIS — E440 Moderate protein-calorie malnutrition: Secondary | ICD-10-CM | POA: Diagnosis not present

## 2022-06-11 NOTE — Telephone Encounter (Signed)
With the patients build up of fluid from his kidney he can not keep his breath long enough to walk for the test on a treadmill. Will confirm with MD if okay to change to [facility].

## 2022-06-11 NOTE — Telephone Encounter (Signed)
Pt is requesting call in regards to the upcoming stress test coming up. He states that he does not believe he is physically up for this test and would like a call back to discuss alternatives.

## 2022-06-12 DIAGNOSIS — N2581 Secondary hyperparathyroidism of renal origin: Secondary | ICD-10-CM | POA: Diagnosis not present

## 2022-06-12 DIAGNOSIS — N186 End stage renal disease: Secondary | ICD-10-CM | POA: Diagnosis not present

## 2022-06-12 DIAGNOSIS — E440 Moderate protein-calorie malnutrition: Secondary | ICD-10-CM | POA: Diagnosis not present

## 2022-06-12 DIAGNOSIS — Z992 Dependence on renal dialysis: Secondary | ICD-10-CM | POA: Diagnosis not present

## 2022-06-12 DIAGNOSIS — D631 Anemia in chronic kidney disease: Secondary | ICD-10-CM | POA: Diagnosis not present

## 2022-06-12 DIAGNOSIS — D509 Iron deficiency anemia, unspecified: Secondary | ICD-10-CM | POA: Diagnosis not present

## 2022-06-14 ENCOUNTER — Ambulatory Visit (HOSPITAL_COMMUNITY): Payer: Medicare Other | Attending: Internal Medicine

## 2022-06-14 VITALS — Ht 77.0 in | Wt 339.0 lb

## 2022-06-14 DIAGNOSIS — N186 End stage renal disease: Secondary | ICD-10-CM | POA: Diagnosis not present

## 2022-06-14 DIAGNOSIS — I4819 Other persistent atrial fibrillation: Secondary | ICD-10-CM

## 2022-06-14 DIAGNOSIS — D631 Anemia in chronic kidney disease: Secondary | ICD-10-CM | POA: Diagnosis not present

## 2022-06-14 DIAGNOSIS — Z7682 Awaiting organ transplant status: Secondary | ICD-10-CM

## 2022-06-14 DIAGNOSIS — Z992 Dependence on renal dialysis: Secondary | ICD-10-CM | POA: Diagnosis not present

## 2022-06-14 DIAGNOSIS — R110 Nausea: Secondary | ICD-10-CM | POA: Diagnosis not present

## 2022-06-14 DIAGNOSIS — N2581 Secondary hyperparathyroidism of renal origin: Secondary | ICD-10-CM | POA: Diagnosis not present

## 2022-06-14 DIAGNOSIS — E440 Moderate protein-calorie malnutrition: Secondary | ICD-10-CM | POA: Diagnosis not present

## 2022-06-14 DIAGNOSIS — D509 Iron deficiency anemia, unspecified: Secondary | ICD-10-CM | POA: Diagnosis not present

## 2022-06-14 MED ORDER — REGADENOSON 0.4 MG/5ML IV SOLN
0.4000 mg | Freq: Once | INTRAVENOUS | Status: AC
  Administered 2022-06-14: 0.4 mg via INTRAVENOUS

## 2022-06-14 MED ORDER — AMINOPHYLLINE 25 MG/ML IV SOLN
75.0000 mg | Freq: Once | INTRAVENOUS | Status: AC
  Administered 2022-06-14: 75 mg via INTRAVENOUS

## 2022-06-14 MED ORDER — TECHNETIUM TC 99M TETROFOSMIN IV KIT
33.0000 | PACK | Freq: Once | INTRAVENOUS | Status: AC | PRN
  Administered 2022-06-14: 33 via INTRAVENOUS

## 2022-06-15 ENCOUNTER — Ambulatory Visit (HOSPITAL_COMMUNITY): Payer: Medicare Other | Attending: Cardiology

## 2022-06-15 DIAGNOSIS — Z23 Encounter for immunization: Secondary | ICD-10-CM | POA: Diagnosis not present

## 2022-06-15 DIAGNOSIS — D509 Iron deficiency anemia, unspecified: Secondary | ICD-10-CM | POA: Diagnosis not present

## 2022-06-15 DIAGNOSIS — N186 End stage renal disease: Secondary | ICD-10-CM | POA: Diagnosis not present

## 2022-06-15 DIAGNOSIS — N2581 Secondary hyperparathyroidism of renal origin: Secondary | ICD-10-CM | POA: Diagnosis not present

## 2022-06-15 DIAGNOSIS — D631 Anemia in chronic kidney disease: Secondary | ICD-10-CM | POA: Diagnosis not present

## 2022-06-15 DIAGNOSIS — I120 Hypertensive chronic kidney disease with stage 5 chronic kidney disease or end stage renal disease: Secondary | ICD-10-CM | POA: Diagnosis not present

## 2022-06-15 DIAGNOSIS — N2589 Other disorders resulting from impaired renal tubular function: Secondary | ICD-10-CM | POA: Diagnosis not present

## 2022-06-15 DIAGNOSIS — Z992 Dependence on renal dialysis: Secondary | ICD-10-CM | POA: Diagnosis not present

## 2022-06-15 LAB — MYOCARDIAL PERFUSION IMAGING
LV dias vol: 211 mL (ref 62–150)
LV sys vol: 118 mL
Nuc Stress EF: 44 %
Peak HR: 59 {beats}/min
Rest HR: 48 {beats}/min
Rest Nuclear Isotope Dose: 31.7 mCi
SDS: 0
SRS: 0
SSS: 0
ST Depression (mm): 0 mm
Stress Nuclear Isotope Dose: 33 mCi
TID: 1.32

## 2022-06-15 MED ORDER — TECHNETIUM TC 99M TETROFOSMIN IV KIT
31.7000 | PACK | Freq: Once | INTRAVENOUS | Status: AC | PRN
  Administered 2022-06-15: 31.7 via INTRAVENOUS

## 2022-06-18 DIAGNOSIS — N2589 Other disorders resulting from impaired renal tubular function: Secondary | ICD-10-CM | POA: Diagnosis not present

## 2022-06-18 DIAGNOSIS — D631 Anemia in chronic kidney disease: Secondary | ICD-10-CM | POA: Diagnosis not present

## 2022-06-18 DIAGNOSIS — Z992 Dependence on renal dialysis: Secondary | ICD-10-CM | POA: Diagnosis not present

## 2022-06-18 DIAGNOSIS — N2581 Secondary hyperparathyroidism of renal origin: Secondary | ICD-10-CM | POA: Diagnosis not present

## 2022-06-18 DIAGNOSIS — D509 Iron deficiency anemia, unspecified: Secondary | ICD-10-CM | POA: Diagnosis not present

## 2022-06-18 DIAGNOSIS — N186 End stage renal disease: Secondary | ICD-10-CM | POA: Diagnosis not present

## 2022-06-19 DIAGNOSIS — D509 Iron deficiency anemia, unspecified: Secondary | ICD-10-CM | POA: Diagnosis not present

## 2022-06-19 DIAGNOSIS — D631 Anemia in chronic kidney disease: Secondary | ICD-10-CM | POA: Diagnosis not present

## 2022-06-19 DIAGNOSIS — N2581 Secondary hyperparathyroidism of renal origin: Secondary | ICD-10-CM | POA: Diagnosis not present

## 2022-06-19 DIAGNOSIS — N186 End stage renal disease: Secondary | ICD-10-CM | POA: Diagnosis not present

## 2022-06-19 DIAGNOSIS — N2589 Other disorders resulting from impaired renal tubular function: Secondary | ICD-10-CM | POA: Diagnosis not present

## 2022-06-19 DIAGNOSIS — Z992 Dependence on renal dialysis: Secondary | ICD-10-CM | POA: Diagnosis not present

## 2022-06-19 NOTE — Telephone Encounter (Signed)
Updated order. Testing has been rescheduled.

## 2022-06-21 DIAGNOSIS — Z992 Dependence on renal dialysis: Secondary | ICD-10-CM | POA: Diagnosis not present

## 2022-06-21 DIAGNOSIS — D631 Anemia in chronic kidney disease: Secondary | ICD-10-CM | POA: Diagnosis not present

## 2022-06-21 DIAGNOSIS — N186 End stage renal disease: Secondary | ICD-10-CM | POA: Diagnosis not present

## 2022-06-21 DIAGNOSIS — N2589 Other disorders resulting from impaired renal tubular function: Secondary | ICD-10-CM | POA: Diagnosis not present

## 2022-06-21 DIAGNOSIS — D509 Iron deficiency anemia, unspecified: Secondary | ICD-10-CM | POA: Diagnosis not present

## 2022-06-21 DIAGNOSIS — N2581 Secondary hyperparathyroidism of renal origin: Secondary | ICD-10-CM | POA: Diagnosis not present

## 2022-06-22 DIAGNOSIS — N2589 Other disorders resulting from impaired renal tubular function: Secondary | ICD-10-CM | POA: Diagnosis not present

## 2022-06-22 DIAGNOSIS — D509 Iron deficiency anemia, unspecified: Secondary | ICD-10-CM | POA: Diagnosis not present

## 2022-06-22 DIAGNOSIS — N186 End stage renal disease: Secondary | ICD-10-CM | POA: Diagnosis not present

## 2022-06-22 DIAGNOSIS — N2581 Secondary hyperparathyroidism of renal origin: Secondary | ICD-10-CM | POA: Diagnosis not present

## 2022-06-22 DIAGNOSIS — Z992 Dependence on renal dialysis: Secondary | ICD-10-CM | POA: Diagnosis not present

## 2022-06-22 DIAGNOSIS — D631 Anemia in chronic kidney disease: Secondary | ICD-10-CM | POA: Diagnosis not present

## 2022-06-25 ENCOUNTER — Ambulatory Visit (HOSPITAL_COMMUNITY): Payer: Medicare Other | Attending: Cardiology

## 2022-06-25 DIAGNOSIS — N186 End stage renal disease: Secondary | ICD-10-CM | POA: Diagnosis not present

## 2022-06-25 DIAGNOSIS — I340 Nonrheumatic mitral (valve) insufficiency: Secondary | ICD-10-CM | POA: Diagnosis not present

## 2022-06-25 DIAGNOSIS — I10 Essential (primary) hypertension: Secondary | ICD-10-CM | POA: Diagnosis not present

## 2022-06-25 DIAGNOSIS — N2589 Other disorders resulting from impaired renal tubular function: Secondary | ICD-10-CM | POA: Diagnosis not present

## 2022-06-25 DIAGNOSIS — D631 Anemia in chronic kidney disease: Secondary | ICD-10-CM | POA: Diagnosis not present

## 2022-06-25 DIAGNOSIS — N2581 Secondary hyperparathyroidism of renal origin: Secondary | ICD-10-CM | POA: Diagnosis not present

## 2022-06-25 DIAGNOSIS — D509 Iron deficiency anemia, unspecified: Secondary | ICD-10-CM | POA: Diagnosis not present

## 2022-06-25 DIAGNOSIS — I4891 Unspecified atrial fibrillation: Secondary | ICD-10-CM

## 2022-06-25 DIAGNOSIS — Z992 Dependence on renal dialysis: Secondary | ICD-10-CM | POA: Diagnosis not present

## 2022-06-25 LAB — ECHOCARDIOGRAM COMPLETE
Area-P 1/2: 4.02 cm2
MV M vel: 5.42 m/s
MV Peak grad: 117.5 mmHg
S' Lateral: 4.2 cm

## 2022-06-26 DIAGNOSIS — Z992 Dependence on renal dialysis: Secondary | ICD-10-CM | POA: Diagnosis not present

## 2022-06-26 DIAGNOSIS — N186 End stage renal disease: Secondary | ICD-10-CM | POA: Diagnosis not present

## 2022-06-26 DIAGNOSIS — N2581 Secondary hyperparathyroidism of renal origin: Secondary | ICD-10-CM | POA: Diagnosis not present

## 2022-06-26 DIAGNOSIS — N2589 Other disorders resulting from impaired renal tubular function: Secondary | ICD-10-CM | POA: Diagnosis not present

## 2022-06-26 DIAGNOSIS — D509 Iron deficiency anemia, unspecified: Secondary | ICD-10-CM | POA: Diagnosis not present

## 2022-06-26 DIAGNOSIS — D631 Anemia in chronic kidney disease: Secondary | ICD-10-CM | POA: Diagnosis not present

## 2022-06-28 DIAGNOSIS — Z992 Dependence on renal dialysis: Secondary | ICD-10-CM | POA: Diagnosis not present

## 2022-06-28 DIAGNOSIS — N2589 Other disorders resulting from impaired renal tubular function: Secondary | ICD-10-CM | POA: Diagnosis not present

## 2022-06-28 DIAGNOSIS — N186 End stage renal disease: Secondary | ICD-10-CM | POA: Diagnosis not present

## 2022-06-28 DIAGNOSIS — D631 Anemia in chronic kidney disease: Secondary | ICD-10-CM | POA: Diagnosis not present

## 2022-06-28 DIAGNOSIS — N2581 Secondary hyperparathyroidism of renal origin: Secondary | ICD-10-CM | POA: Diagnosis not present

## 2022-06-28 DIAGNOSIS — D509 Iron deficiency anemia, unspecified: Secondary | ICD-10-CM | POA: Diagnosis not present

## 2022-06-29 DIAGNOSIS — N2581 Secondary hyperparathyroidism of renal origin: Secondary | ICD-10-CM | POA: Diagnosis not present

## 2022-06-29 DIAGNOSIS — D509 Iron deficiency anemia, unspecified: Secondary | ICD-10-CM | POA: Diagnosis not present

## 2022-06-29 DIAGNOSIS — N186 End stage renal disease: Secondary | ICD-10-CM | POA: Diagnosis not present

## 2022-06-29 DIAGNOSIS — N2589 Other disorders resulting from impaired renal tubular function: Secondary | ICD-10-CM | POA: Diagnosis not present

## 2022-06-29 DIAGNOSIS — Z992 Dependence on renal dialysis: Secondary | ICD-10-CM | POA: Diagnosis not present

## 2022-06-29 DIAGNOSIS — D631 Anemia in chronic kidney disease: Secondary | ICD-10-CM | POA: Diagnosis not present

## 2022-07-02 DIAGNOSIS — N2581 Secondary hyperparathyroidism of renal origin: Secondary | ICD-10-CM | POA: Diagnosis not present

## 2022-07-02 DIAGNOSIS — N186 End stage renal disease: Secondary | ICD-10-CM | POA: Diagnosis not present

## 2022-07-02 DIAGNOSIS — Z992 Dependence on renal dialysis: Secondary | ICD-10-CM | POA: Diagnosis not present

## 2022-07-02 DIAGNOSIS — N2589 Other disorders resulting from impaired renal tubular function: Secondary | ICD-10-CM | POA: Diagnosis not present

## 2022-07-02 DIAGNOSIS — D509 Iron deficiency anemia, unspecified: Secondary | ICD-10-CM | POA: Diagnosis not present

## 2022-07-02 DIAGNOSIS — D631 Anemia in chronic kidney disease: Secondary | ICD-10-CM | POA: Diagnosis not present

## 2022-07-03 DIAGNOSIS — N186 End stage renal disease: Secondary | ICD-10-CM | POA: Diagnosis not present

## 2022-07-03 DIAGNOSIS — N2589 Other disorders resulting from impaired renal tubular function: Secondary | ICD-10-CM | POA: Diagnosis not present

## 2022-07-03 DIAGNOSIS — N2581 Secondary hyperparathyroidism of renal origin: Secondary | ICD-10-CM | POA: Diagnosis not present

## 2022-07-03 DIAGNOSIS — D631 Anemia in chronic kidney disease: Secondary | ICD-10-CM | POA: Diagnosis not present

## 2022-07-03 DIAGNOSIS — D509 Iron deficiency anemia, unspecified: Secondary | ICD-10-CM | POA: Diagnosis not present

## 2022-07-03 DIAGNOSIS — Z992 Dependence on renal dialysis: Secondary | ICD-10-CM | POA: Diagnosis not present

## 2022-07-04 DIAGNOSIS — D631 Anemia in chronic kidney disease: Secondary | ICD-10-CM | POA: Diagnosis not present

## 2022-07-04 DIAGNOSIS — M1040 Other secondary gout, unspecified site: Secondary | ICD-10-CM | POA: Diagnosis not present

## 2022-07-04 DIAGNOSIS — Z992 Dependence on renal dialysis: Secondary | ICD-10-CM | POA: Diagnosis not present

## 2022-07-04 DIAGNOSIS — D509 Iron deficiency anemia, unspecified: Secondary | ICD-10-CM | POA: Diagnosis not present

## 2022-07-04 DIAGNOSIS — N186 End stage renal disease: Secondary | ICD-10-CM | POA: Diagnosis not present

## 2022-07-04 DIAGNOSIS — N2581 Secondary hyperparathyroidism of renal origin: Secondary | ICD-10-CM | POA: Diagnosis not present

## 2022-07-04 DIAGNOSIS — N2589 Other disorders resulting from impaired renal tubular function: Secondary | ICD-10-CM | POA: Diagnosis not present

## 2022-07-05 DIAGNOSIS — N2581 Secondary hyperparathyroidism of renal origin: Secondary | ICD-10-CM | POA: Diagnosis not present

## 2022-07-05 DIAGNOSIS — N2589 Other disorders resulting from impaired renal tubular function: Secondary | ICD-10-CM | POA: Diagnosis not present

## 2022-07-05 DIAGNOSIS — N186 End stage renal disease: Secondary | ICD-10-CM | POA: Diagnosis not present

## 2022-07-05 DIAGNOSIS — D509 Iron deficiency anemia, unspecified: Secondary | ICD-10-CM | POA: Diagnosis not present

## 2022-07-05 DIAGNOSIS — D631 Anemia in chronic kidney disease: Secondary | ICD-10-CM | POA: Diagnosis not present

## 2022-07-05 DIAGNOSIS — Z992 Dependence on renal dialysis: Secondary | ICD-10-CM | POA: Diagnosis not present

## 2022-07-06 DIAGNOSIS — N2589 Other disorders resulting from impaired renal tubular function: Secondary | ICD-10-CM | POA: Diagnosis not present

## 2022-07-06 DIAGNOSIS — N2581 Secondary hyperparathyroidism of renal origin: Secondary | ICD-10-CM | POA: Diagnosis not present

## 2022-07-06 DIAGNOSIS — Z992 Dependence on renal dialysis: Secondary | ICD-10-CM | POA: Diagnosis not present

## 2022-07-06 DIAGNOSIS — N186 End stage renal disease: Secondary | ICD-10-CM | POA: Diagnosis not present

## 2022-07-06 DIAGNOSIS — D631 Anemia in chronic kidney disease: Secondary | ICD-10-CM | POA: Diagnosis not present

## 2022-07-06 DIAGNOSIS — D509 Iron deficiency anemia, unspecified: Secondary | ICD-10-CM | POA: Diagnosis not present

## 2022-07-09 DIAGNOSIS — N2589 Other disorders resulting from impaired renal tubular function: Secondary | ICD-10-CM | POA: Diagnosis not present

## 2022-07-09 DIAGNOSIS — D509 Iron deficiency anemia, unspecified: Secondary | ICD-10-CM | POA: Diagnosis not present

## 2022-07-09 DIAGNOSIS — N186 End stage renal disease: Secondary | ICD-10-CM | POA: Diagnosis not present

## 2022-07-09 DIAGNOSIS — N2581 Secondary hyperparathyroidism of renal origin: Secondary | ICD-10-CM | POA: Diagnosis not present

## 2022-07-09 DIAGNOSIS — Z992 Dependence on renal dialysis: Secondary | ICD-10-CM | POA: Diagnosis not present

## 2022-07-09 DIAGNOSIS — D631 Anemia in chronic kidney disease: Secondary | ICD-10-CM | POA: Diagnosis not present

## 2022-07-10 DIAGNOSIS — D631 Anemia in chronic kidney disease: Secondary | ICD-10-CM | POA: Diagnosis not present

## 2022-07-10 DIAGNOSIS — D509 Iron deficiency anemia, unspecified: Secondary | ICD-10-CM | POA: Diagnosis not present

## 2022-07-10 DIAGNOSIS — N2581 Secondary hyperparathyroidism of renal origin: Secondary | ICD-10-CM | POA: Diagnosis not present

## 2022-07-10 DIAGNOSIS — N186 End stage renal disease: Secondary | ICD-10-CM | POA: Diagnosis not present

## 2022-07-10 DIAGNOSIS — N2589 Other disorders resulting from impaired renal tubular function: Secondary | ICD-10-CM | POA: Diagnosis not present

## 2022-07-10 DIAGNOSIS — Z992 Dependence on renal dialysis: Secondary | ICD-10-CM | POA: Diagnosis not present

## 2022-07-11 DIAGNOSIS — Z992 Dependence on renal dialysis: Secondary | ICD-10-CM | POA: Diagnosis not present

## 2022-07-11 DIAGNOSIS — N2589 Other disorders resulting from impaired renal tubular function: Secondary | ICD-10-CM | POA: Diagnosis not present

## 2022-07-11 DIAGNOSIS — N186 End stage renal disease: Secondary | ICD-10-CM | POA: Diagnosis not present

## 2022-07-11 DIAGNOSIS — D631 Anemia in chronic kidney disease: Secondary | ICD-10-CM | POA: Diagnosis not present

## 2022-07-11 DIAGNOSIS — N2581 Secondary hyperparathyroidism of renal origin: Secondary | ICD-10-CM | POA: Diagnosis not present

## 2022-07-11 DIAGNOSIS — D509 Iron deficiency anemia, unspecified: Secondary | ICD-10-CM | POA: Diagnosis not present

## 2022-07-12 DIAGNOSIS — N186 End stage renal disease: Secondary | ICD-10-CM | POA: Diagnosis not present

## 2022-07-12 DIAGNOSIS — N2581 Secondary hyperparathyroidism of renal origin: Secondary | ICD-10-CM | POA: Diagnosis not present

## 2022-07-12 DIAGNOSIS — Z992 Dependence on renal dialysis: Secondary | ICD-10-CM | POA: Diagnosis not present

## 2022-07-12 DIAGNOSIS — N2589 Other disorders resulting from impaired renal tubular function: Secondary | ICD-10-CM | POA: Diagnosis not present

## 2022-07-12 DIAGNOSIS — D509 Iron deficiency anemia, unspecified: Secondary | ICD-10-CM | POA: Diagnosis not present

## 2022-07-12 DIAGNOSIS — D631 Anemia in chronic kidney disease: Secondary | ICD-10-CM | POA: Diagnosis not present

## 2022-07-13 DIAGNOSIS — Z992 Dependence on renal dialysis: Secondary | ICD-10-CM | POA: Diagnosis not present

## 2022-07-13 DIAGNOSIS — N2581 Secondary hyperparathyroidism of renal origin: Secondary | ICD-10-CM | POA: Diagnosis not present

## 2022-07-13 DIAGNOSIS — D509 Iron deficiency anemia, unspecified: Secondary | ICD-10-CM | POA: Diagnosis not present

## 2022-07-13 DIAGNOSIS — D631 Anemia in chronic kidney disease: Secondary | ICD-10-CM | POA: Diagnosis not present

## 2022-07-13 DIAGNOSIS — N2589 Other disorders resulting from impaired renal tubular function: Secondary | ICD-10-CM | POA: Diagnosis not present

## 2022-07-13 DIAGNOSIS — N186 End stage renal disease: Secondary | ICD-10-CM | POA: Diagnosis not present

## 2022-07-15 DIAGNOSIS — N028 Recurrent and persistent hematuria with other morphologic changes: Secondary | ICD-10-CM | POA: Diagnosis not present

## 2022-07-16 ENCOUNTER — Other Ambulatory Visit (HOSPITAL_COMMUNITY): Payer: Self-pay | Admitting: *Deleted

## 2022-07-16 DIAGNOSIS — E440 Moderate protein-calorie malnutrition: Secondary | ICD-10-CM | POA: Diagnosis not present

## 2022-07-16 DIAGNOSIS — N2581 Secondary hyperparathyroidism of renal origin: Secondary | ICD-10-CM | POA: Diagnosis not present

## 2022-07-16 DIAGNOSIS — I120 Hypertensive chronic kidney disease with stage 5 chronic kidney disease or end stage renal disease: Secondary | ICD-10-CM | POA: Diagnosis not present

## 2022-07-16 DIAGNOSIS — D509 Iron deficiency anemia, unspecified: Secondary | ICD-10-CM | POA: Diagnosis not present

## 2022-07-16 DIAGNOSIS — Z79899 Other long term (current) drug therapy: Secondary | ICD-10-CM | POA: Diagnosis not present

## 2022-07-16 DIAGNOSIS — D631 Anemia in chronic kidney disease: Secondary | ICD-10-CM | POA: Diagnosis not present

## 2022-07-16 DIAGNOSIS — N186 End stage renal disease: Secondary | ICD-10-CM | POA: Diagnosis not present

## 2022-07-16 DIAGNOSIS — Z992 Dependence on renal dialysis: Secondary | ICD-10-CM | POA: Diagnosis not present

## 2022-07-16 MED ORDER — AMIODARONE HCL 200 MG PO TABS: 200.0000 mg | ORAL_TABLET | Freq: Every day | ORAL | 1 refills | Status: DC

## 2022-07-17 DIAGNOSIS — E440 Moderate protein-calorie malnutrition: Secondary | ICD-10-CM | POA: Diagnosis not present

## 2022-07-17 DIAGNOSIS — D631 Anemia in chronic kidney disease: Secondary | ICD-10-CM | POA: Diagnosis not present

## 2022-07-17 DIAGNOSIS — Z992 Dependence on renal dialysis: Secondary | ICD-10-CM | POA: Diagnosis not present

## 2022-07-17 DIAGNOSIS — N186 End stage renal disease: Secondary | ICD-10-CM | POA: Diagnosis not present

## 2022-07-17 DIAGNOSIS — D509 Iron deficiency anemia, unspecified: Secondary | ICD-10-CM | POA: Diagnosis not present

## 2022-07-17 DIAGNOSIS — N2581 Secondary hyperparathyroidism of renal origin: Secondary | ICD-10-CM | POA: Diagnosis not present

## 2022-07-19 DIAGNOSIS — D509 Iron deficiency anemia, unspecified: Secondary | ICD-10-CM | POA: Diagnosis not present

## 2022-07-19 DIAGNOSIS — Z992 Dependence on renal dialysis: Secondary | ICD-10-CM | POA: Diagnosis not present

## 2022-07-19 DIAGNOSIS — E440 Moderate protein-calorie malnutrition: Secondary | ICD-10-CM | POA: Diagnosis not present

## 2022-07-19 DIAGNOSIS — D631 Anemia in chronic kidney disease: Secondary | ICD-10-CM | POA: Diagnosis not present

## 2022-07-19 DIAGNOSIS — N186 End stage renal disease: Secondary | ICD-10-CM | POA: Diagnosis not present

## 2022-07-19 DIAGNOSIS — N2581 Secondary hyperparathyroidism of renal origin: Secondary | ICD-10-CM | POA: Diagnosis not present

## 2022-07-20 DIAGNOSIS — D509 Iron deficiency anemia, unspecified: Secondary | ICD-10-CM | POA: Diagnosis not present

## 2022-07-20 DIAGNOSIS — N186 End stage renal disease: Secondary | ICD-10-CM | POA: Diagnosis not present

## 2022-07-20 DIAGNOSIS — E440 Moderate protein-calorie malnutrition: Secondary | ICD-10-CM | POA: Diagnosis not present

## 2022-07-20 DIAGNOSIS — D631 Anemia in chronic kidney disease: Secondary | ICD-10-CM | POA: Diagnosis not present

## 2022-07-20 DIAGNOSIS — Z992 Dependence on renal dialysis: Secondary | ICD-10-CM | POA: Diagnosis not present

## 2022-07-20 DIAGNOSIS — N2581 Secondary hyperparathyroidism of renal origin: Secondary | ICD-10-CM | POA: Diagnosis not present

## 2022-07-23 DIAGNOSIS — N2581 Secondary hyperparathyroidism of renal origin: Secondary | ICD-10-CM | POA: Diagnosis not present

## 2022-07-23 DIAGNOSIS — D509 Iron deficiency anemia, unspecified: Secondary | ICD-10-CM | POA: Diagnosis not present

## 2022-07-23 DIAGNOSIS — Z992 Dependence on renal dialysis: Secondary | ICD-10-CM | POA: Diagnosis not present

## 2022-07-23 DIAGNOSIS — D631 Anemia in chronic kidney disease: Secondary | ICD-10-CM | POA: Diagnosis not present

## 2022-07-23 DIAGNOSIS — E440 Moderate protein-calorie malnutrition: Secondary | ICD-10-CM | POA: Diagnosis not present

## 2022-07-23 DIAGNOSIS — N186 End stage renal disease: Secondary | ICD-10-CM | POA: Diagnosis not present

## 2022-07-24 DIAGNOSIS — D631 Anemia in chronic kidney disease: Secondary | ICD-10-CM | POA: Diagnosis not present

## 2022-07-24 DIAGNOSIS — D509 Iron deficiency anemia, unspecified: Secondary | ICD-10-CM | POA: Diagnosis not present

## 2022-07-24 DIAGNOSIS — N2581 Secondary hyperparathyroidism of renal origin: Secondary | ICD-10-CM | POA: Diagnosis not present

## 2022-07-24 DIAGNOSIS — Z992 Dependence on renal dialysis: Secondary | ICD-10-CM | POA: Diagnosis not present

## 2022-07-24 DIAGNOSIS — N186 End stage renal disease: Secondary | ICD-10-CM | POA: Diagnosis not present

## 2022-07-24 DIAGNOSIS — E440 Moderate protein-calorie malnutrition: Secondary | ICD-10-CM | POA: Diagnosis not present

## 2022-07-25 DIAGNOSIS — M1040 Other secondary gout, unspecified site: Secondary | ICD-10-CM | POA: Diagnosis not present

## 2022-07-25 DIAGNOSIS — D631 Anemia in chronic kidney disease: Secondary | ICD-10-CM | POA: Diagnosis not present

## 2022-07-25 DIAGNOSIS — N186 End stage renal disease: Secondary | ICD-10-CM | POA: Diagnosis not present

## 2022-07-25 DIAGNOSIS — D509 Iron deficiency anemia, unspecified: Secondary | ICD-10-CM | POA: Diagnosis not present

## 2022-07-25 DIAGNOSIS — Z992 Dependence on renal dialysis: Secondary | ICD-10-CM | POA: Diagnosis not present

## 2022-07-25 DIAGNOSIS — N2581 Secondary hyperparathyroidism of renal origin: Secondary | ICD-10-CM | POA: Diagnosis not present

## 2022-07-25 DIAGNOSIS — E440 Moderate protein-calorie malnutrition: Secondary | ICD-10-CM | POA: Diagnosis not present

## 2022-07-26 DIAGNOSIS — E440 Moderate protein-calorie malnutrition: Secondary | ICD-10-CM | POA: Diagnosis not present

## 2022-07-26 DIAGNOSIS — D509 Iron deficiency anemia, unspecified: Secondary | ICD-10-CM | POA: Diagnosis not present

## 2022-07-26 DIAGNOSIS — N186 End stage renal disease: Secondary | ICD-10-CM | POA: Diagnosis not present

## 2022-07-26 DIAGNOSIS — Z992 Dependence on renal dialysis: Secondary | ICD-10-CM | POA: Diagnosis not present

## 2022-07-26 DIAGNOSIS — N2581 Secondary hyperparathyroidism of renal origin: Secondary | ICD-10-CM | POA: Diagnosis not present

## 2022-07-26 DIAGNOSIS — D631 Anemia in chronic kidney disease: Secondary | ICD-10-CM | POA: Diagnosis not present

## 2022-07-27 DIAGNOSIS — D631 Anemia in chronic kidney disease: Secondary | ICD-10-CM | POA: Diagnosis not present

## 2022-07-27 DIAGNOSIS — Z992 Dependence on renal dialysis: Secondary | ICD-10-CM | POA: Diagnosis not present

## 2022-07-27 DIAGNOSIS — D509 Iron deficiency anemia, unspecified: Secondary | ICD-10-CM | POA: Diagnosis not present

## 2022-07-27 DIAGNOSIS — E440 Moderate protein-calorie malnutrition: Secondary | ICD-10-CM | POA: Diagnosis not present

## 2022-07-27 DIAGNOSIS — N186 End stage renal disease: Secondary | ICD-10-CM | POA: Diagnosis not present

## 2022-07-27 DIAGNOSIS — N2581 Secondary hyperparathyroidism of renal origin: Secondary | ICD-10-CM | POA: Diagnosis not present

## 2022-07-30 DIAGNOSIS — E440 Moderate protein-calorie malnutrition: Secondary | ICD-10-CM | POA: Diagnosis not present

## 2022-07-30 DIAGNOSIS — Z992 Dependence on renal dialysis: Secondary | ICD-10-CM | POA: Diagnosis not present

## 2022-07-30 DIAGNOSIS — N186 End stage renal disease: Secondary | ICD-10-CM | POA: Diagnosis not present

## 2022-07-30 DIAGNOSIS — D631 Anemia in chronic kidney disease: Secondary | ICD-10-CM | POA: Diagnosis not present

## 2022-07-30 DIAGNOSIS — D509 Iron deficiency anemia, unspecified: Secondary | ICD-10-CM | POA: Diagnosis not present

## 2022-07-30 DIAGNOSIS — N2581 Secondary hyperparathyroidism of renal origin: Secondary | ICD-10-CM | POA: Diagnosis not present

## 2022-07-31 DIAGNOSIS — N186 End stage renal disease: Secondary | ICD-10-CM | POA: Diagnosis not present

## 2022-07-31 DIAGNOSIS — E440 Moderate protein-calorie malnutrition: Secondary | ICD-10-CM | POA: Diagnosis not present

## 2022-07-31 DIAGNOSIS — Z992 Dependence on renal dialysis: Secondary | ICD-10-CM | POA: Diagnosis not present

## 2022-07-31 DIAGNOSIS — D509 Iron deficiency anemia, unspecified: Secondary | ICD-10-CM | POA: Diagnosis not present

## 2022-07-31 DIAGNOSIS — D631 Anemia in chronic kidney disease: Secondary | ICD-10-CM | POA: Diagnosis not present

## 2022-07-31 DIAGNOSIS — N2581 Secondary hyperparathyroidism of renal origin: Secondary | ICD-10-CM | POA: Diagnosis not present

## 2022-08-02 DIAGNOSIS — N186 End stage renal disease: Secondary | ICD-10-CM | POA: Diagnosis not present

## 2022-08-02 DIAGNOSIS — E440 Moderate protein-calorie malnutrition: Secondary | ICD-10-CM | POA: Diagnosis not present

## 2022-08-02 DIAGNOSIS — D509 Iron deficiency anemia, unspecified: Secondary | ICD-10-CM | POA: Diagnosis not present

## 2022-08-02 DIAGNOSIS — Z992 Dependence on renal dialysis: Secondary | ICD-10-CM | POA: Diagnosis not present

## 2022-08-02 DIAGNOSIS — D631 Anemia in chronic kidney disease: Secondary | ICD-10-CM | POA: Diagnosis not present

## 2022-08-02 DIAGNOSIS — N2581 Secondary hyperparathyroidism of renal origin: Secondary | ICD-10-CM | POA: Diagnosis not present

## 2022-08-02 IMAGING — DX DG CHEST 2V
2 series · 2 of 2 positions shown · non-contrast
Comparison: None.

CLINICAL DATA: Chest pain

EXAM:
CHEST - 2 VIEW

[chest pa]
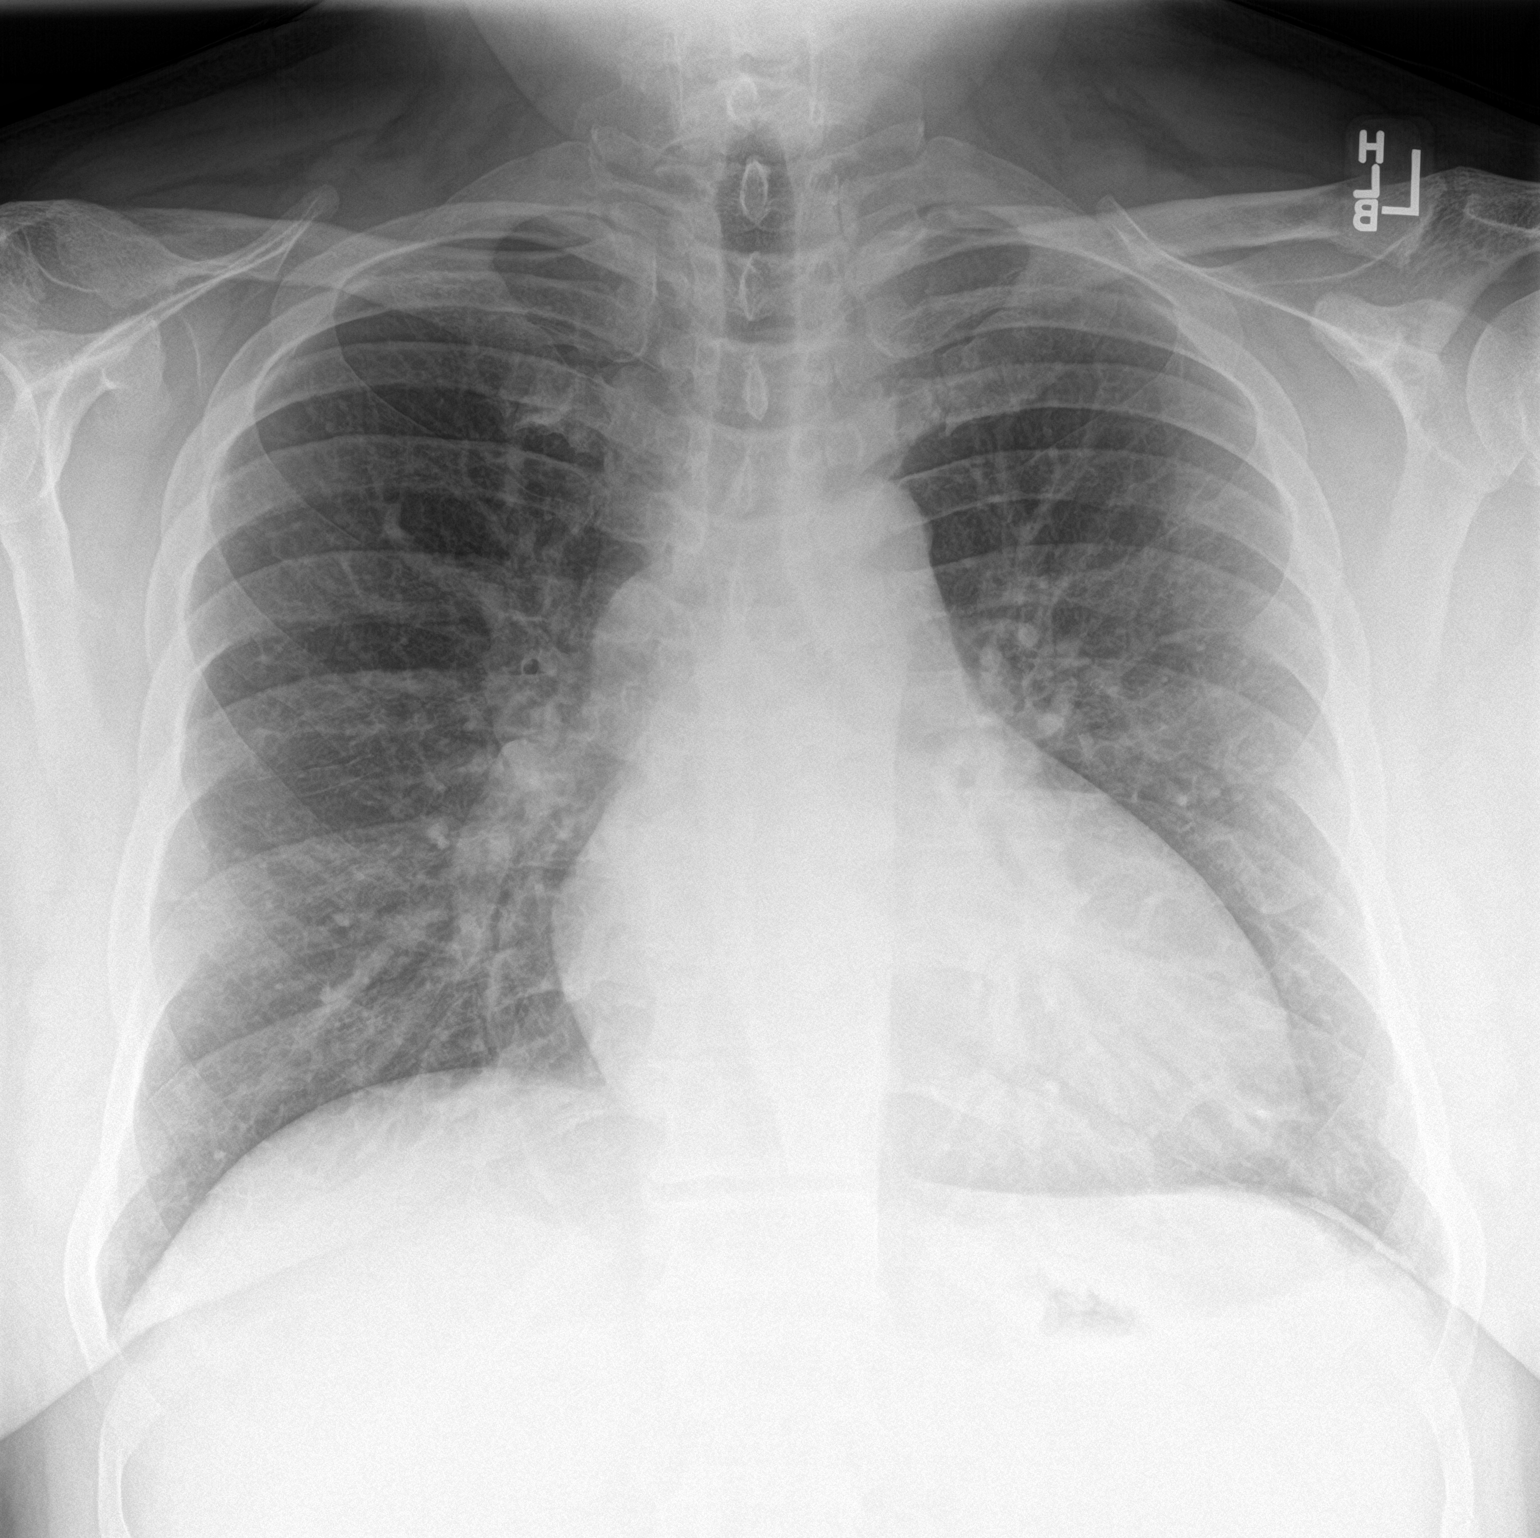

[chest lat]
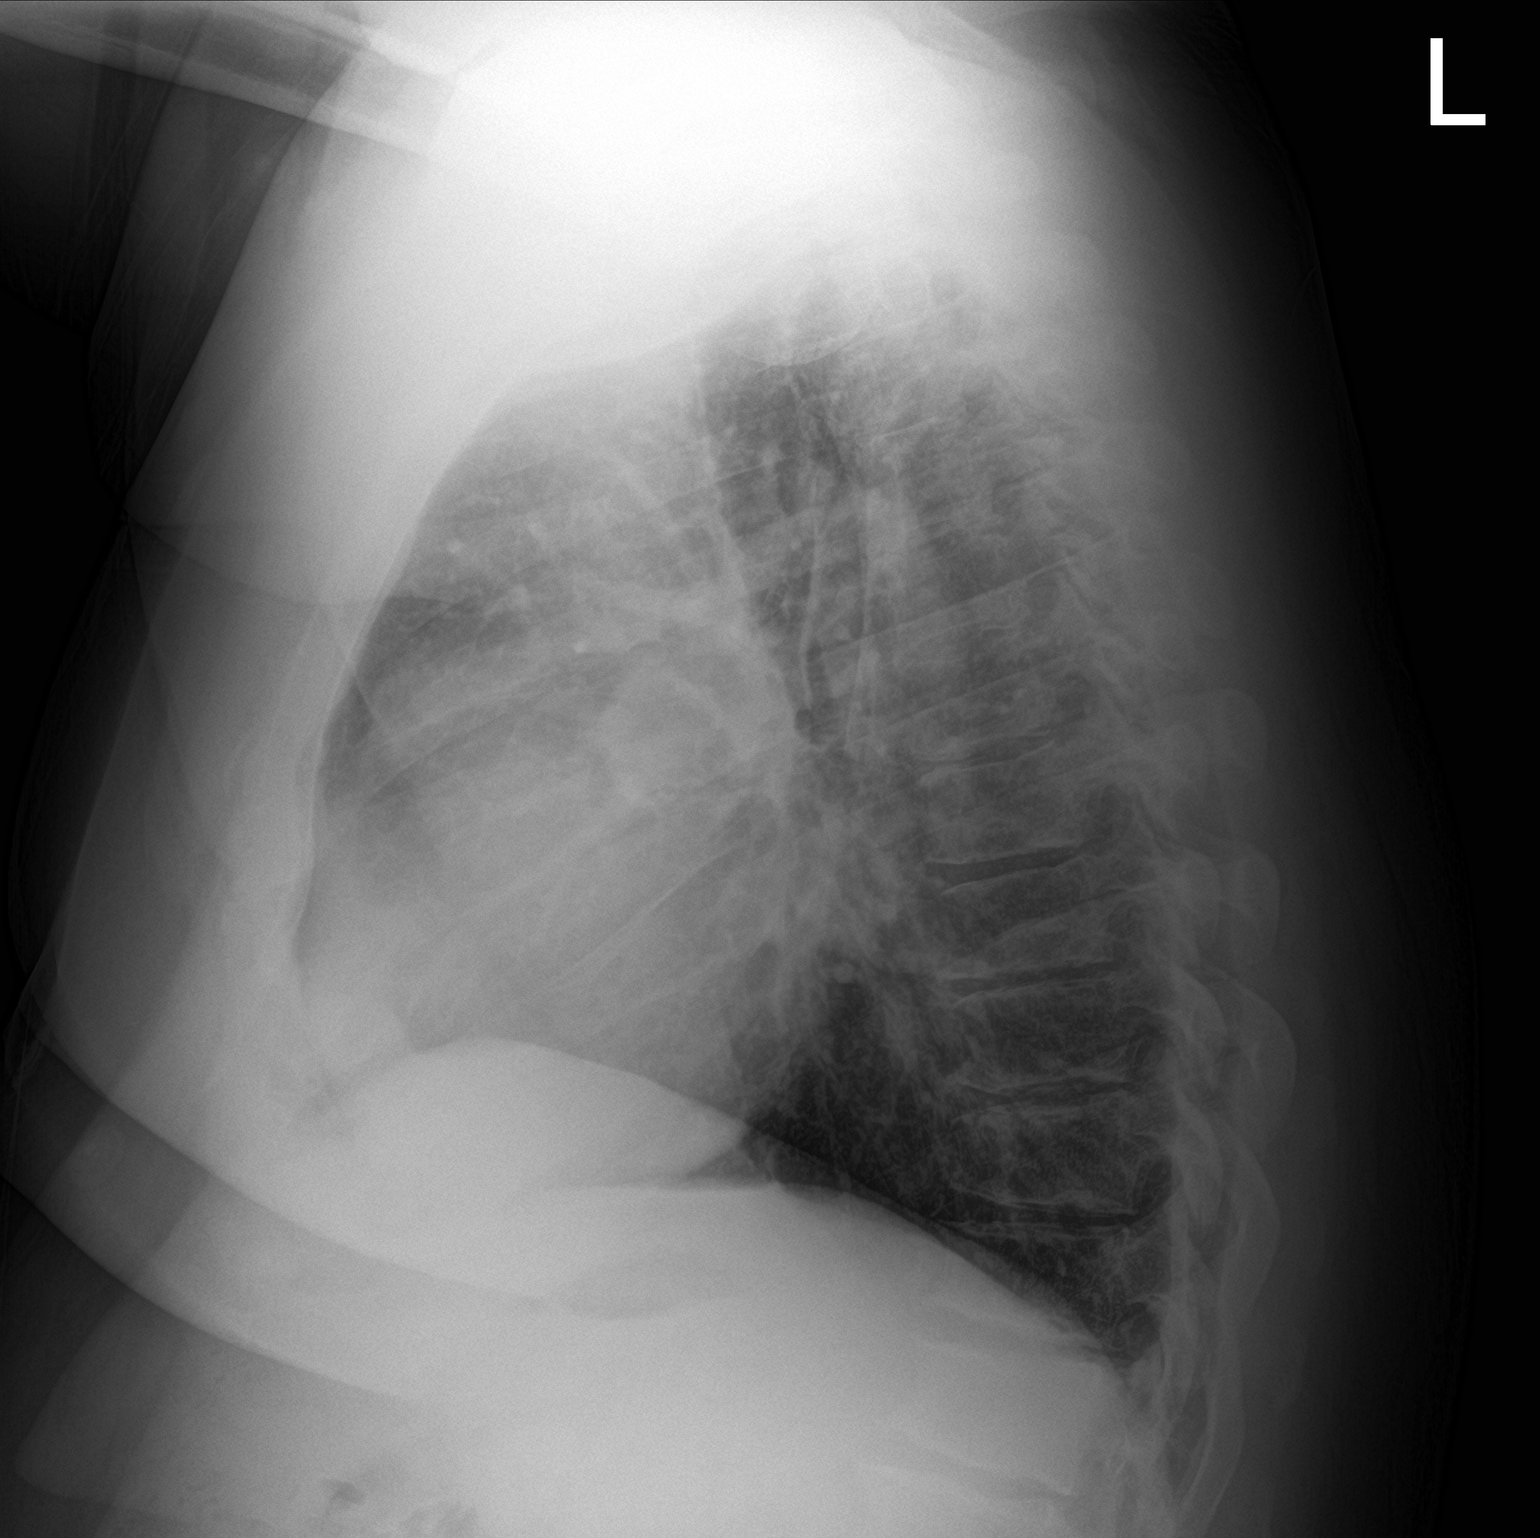

[2 of 2 positions shown; findings below may reference images not displayed]

FINDINGS: The heart size and mediastinal contours are within normal limits.
Both lungs are clear. The visualized skeletal structures are
unremarkable.
IMPRESSION: No active cardiopulmonary disease.

## 2022-08-02 IMAGING — US US EXTREM LOW VENOUS
1 series · 14 of 24 positions shown · non-contrast
Comparison: None.

CLINICAL DATA: Positive D-dimer.

EXAM:
BILATERAL LOWER EXTREMITY VENOUS DOPPLER ULTRASOUND
TECHNIQUE: Gray-scale sonography with compression, as well as color and duplex
ultrasound, were performed to evaluate the deep venous system(s)
from the level of the common femoral vein through the popliteal and
proximal calf veins.

[Series 1: us extrem low venous · 14 of 66 slices shown]
[im 1/66]
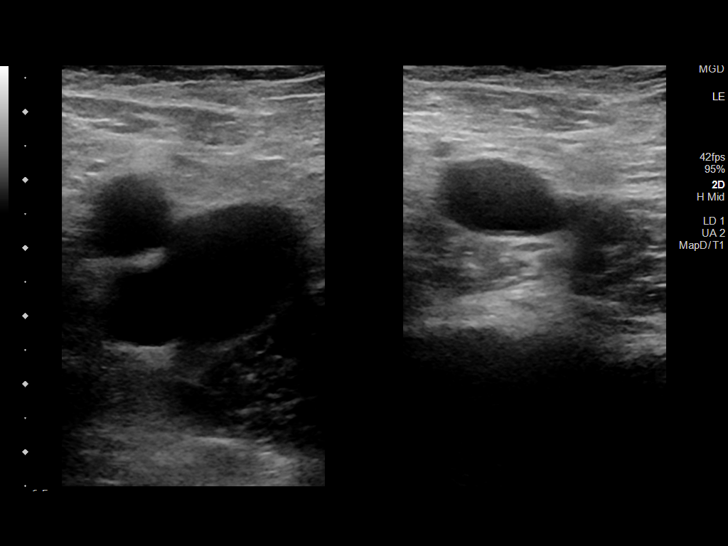
[im 6/66]
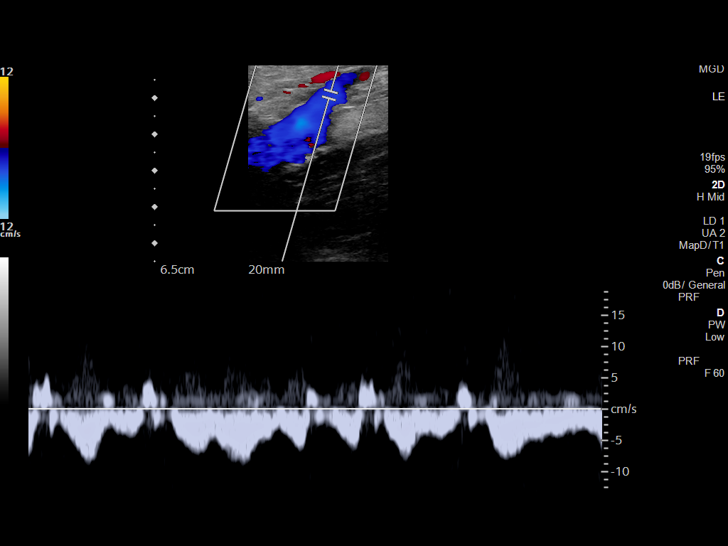
[im 12/66]
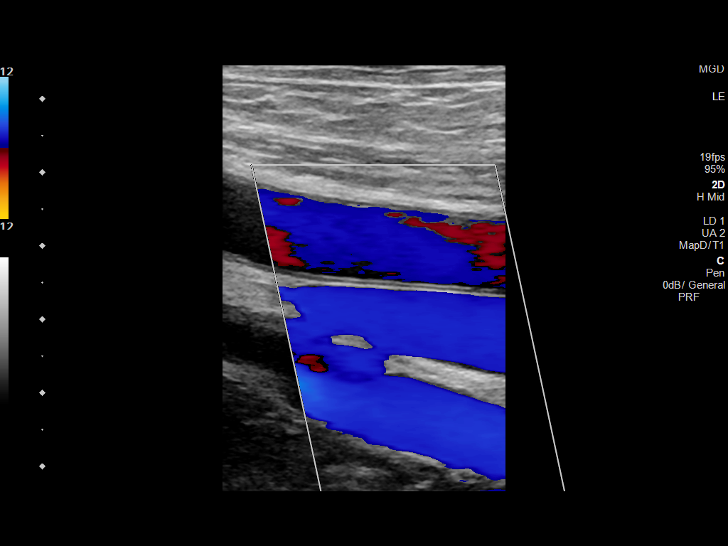
[im 17/66]
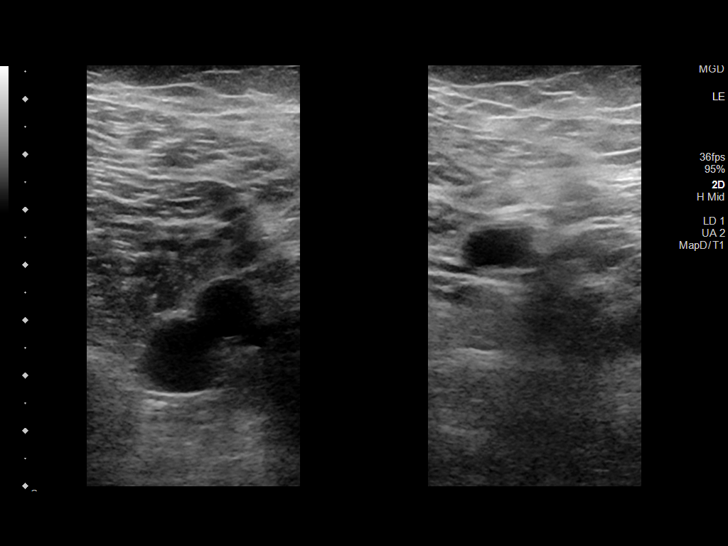
[im 20/66]
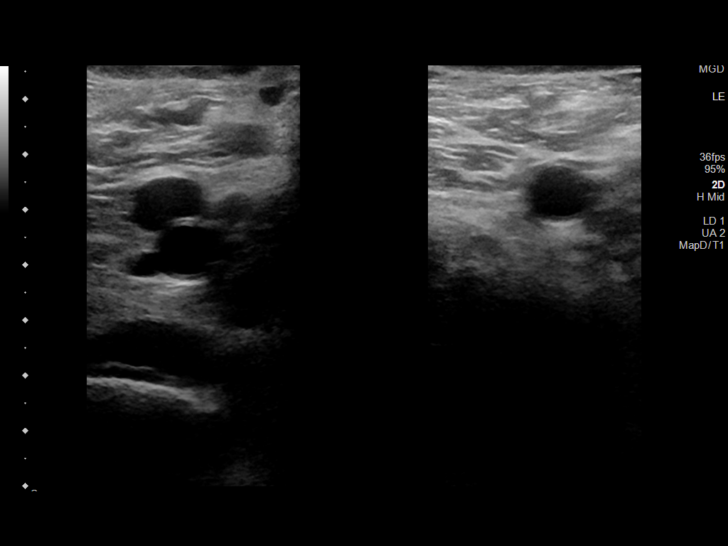
[im 26/66]
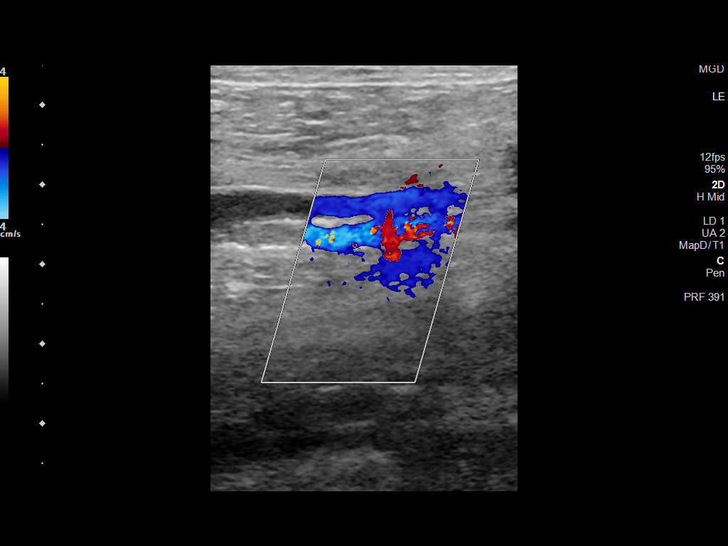
[im 32/66]
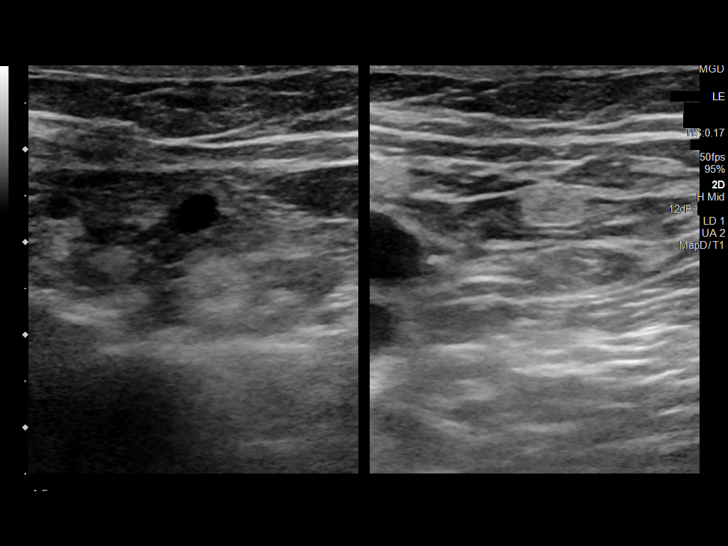
[im 34/66]
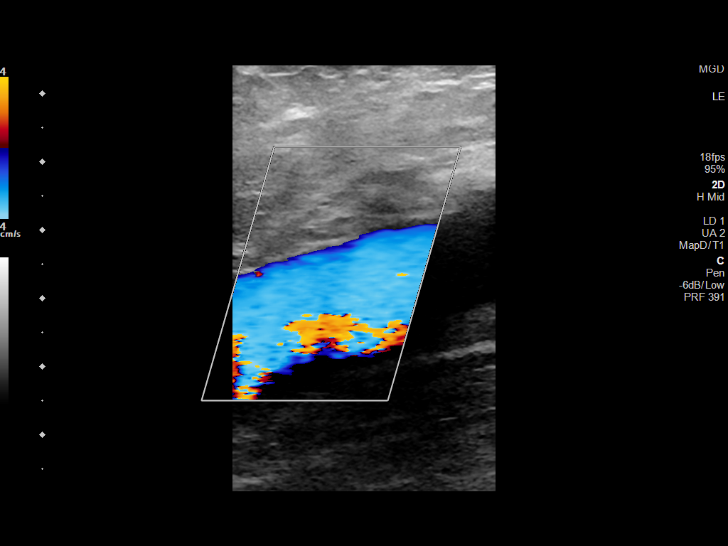
[im 40/66]
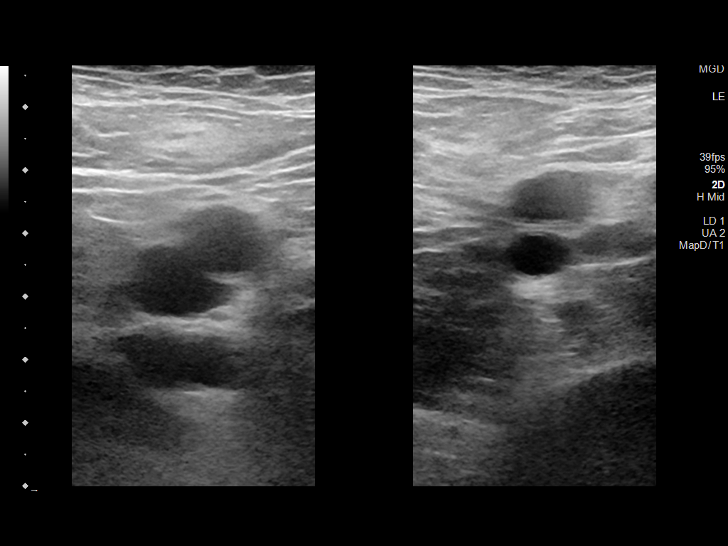
[im 46/66]
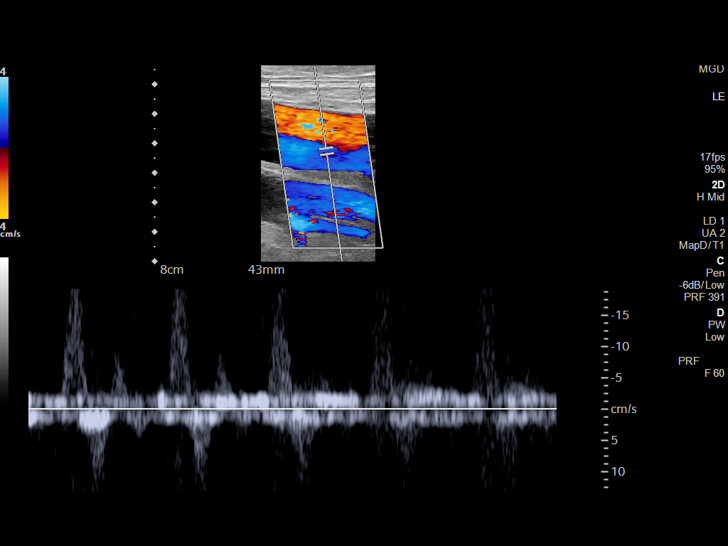
[im 51/66]
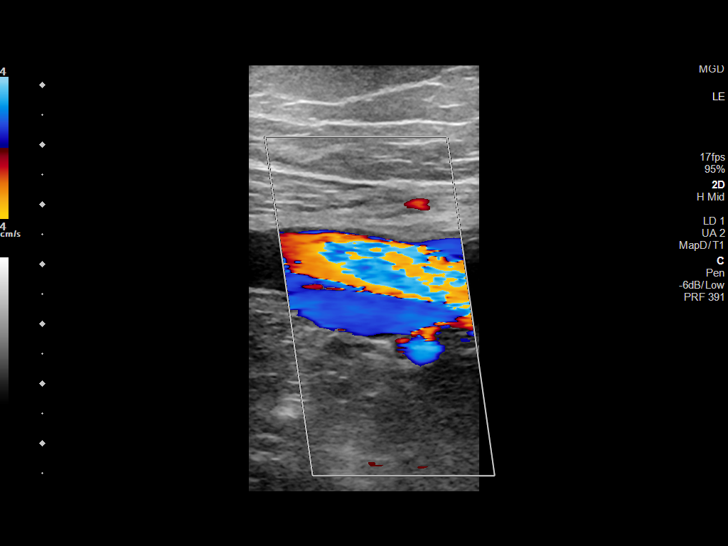
[im 54/66]
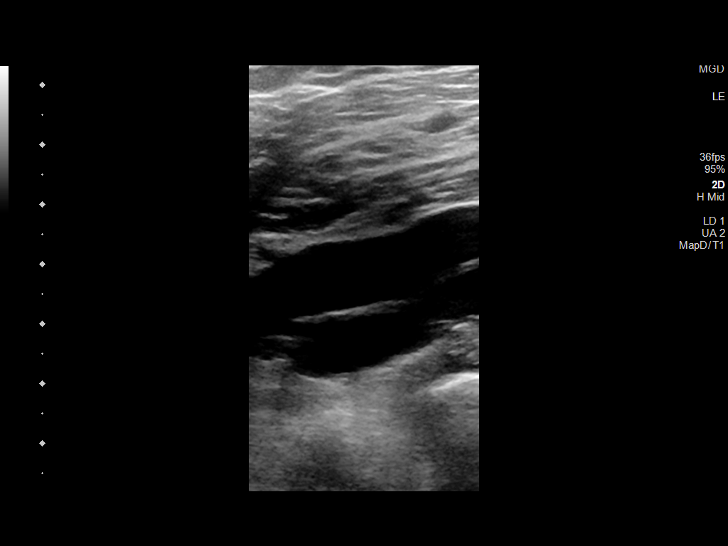
[im 60/66]
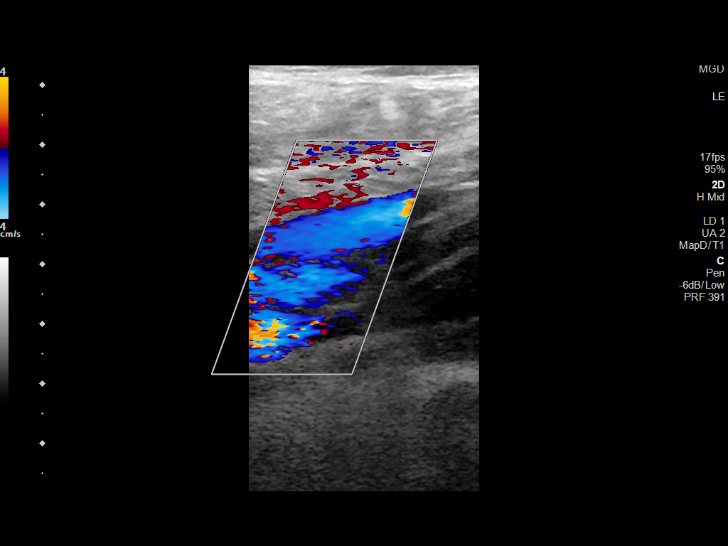
[im 66/66]
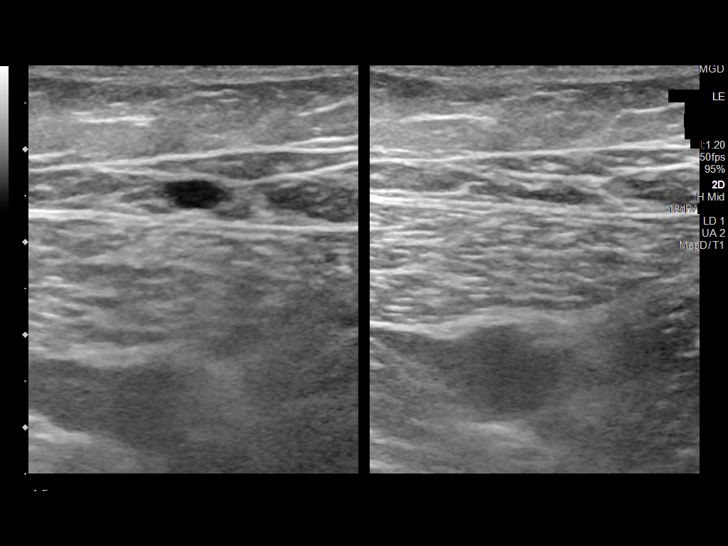

[14 of 24 positions shown; findings below may reference images not displayed]

FINDINGS: VENOUS

Normal compressibility of the common femoral, superficial femoral,
and popliteal veins, as well as the visualized calf veins.
Visualized portions of profunda femoral vein and great saphenous
vein unremarkable. No filling defects to suggest DVT on grayscale or
color Doppler imaging. Doppler waveforms show normal direction of
venous flow, normal respiratory plasticity and response to
augmentation.

Limited views of the contralateral common femoral vein are
unremarkable.

OTHER

None.

Limitations: none
IMPRESSION: Negative.

## 2022-08-03 DIAGNOSIS — Z992 Dependence on renal dialysis: Secondary | ICD-10-CM | POA: Diagnosis not present

## 2022-08-03 DIAGNOSIS — N2581 Secondary hyperparathyroidism of renal origin: Secondary | ICD-10-CM | POA: Diagnosis not present

## 2022-08-03 DIAGNOSIS — N186 End stage renal disease: Secondary | ICD-10-CM | POA: Diagnosis not present

## 2022-08-03 DIAGNOSIS — E440 Moderate protein-calorie malnutrition: Secondary | ICD-10-CM | POA: Diagnosis not present

## 2022-08-03 DIAGNOSIS — D509 Iron deficiency anemia, unspecified: Secondary | ICD-10-CM | POA: Diagnosis not present

## 2022-08-03 DIAGNOSIS — D631 Anemia in chronic kidney disease: Secondary | ICD-10-CM | POA: Diagnosis not present

## 2022-08-06 DIAGNOSIS — D509 Iron deficiency anemia, unspecified: Secondary | ICD-10-CM | POA: Diagnosis not present

## 2022-08-06 DIAGNOSIS — N186 End stage renal disease: Secondary | ICD-10-CM | POA: Diagnosis not present

## 2022-08-06 DIAGNOSIS — Z992 Dependence on renal dialysis: Secondary | ICD-10-CM | POA: Diagnosis not present

## 2022-08-06 DIAGNOSIS — D631 Anemia in chronic kidney disease: Secondary | ICD-10-CM | POA: Diagnosis not present

## 2022-08-06 DIAGNOSIS — E440 Moderate protein-calorie malnutrition: Secondary | ICD-10-CM | POA: Diagnosis not present

## 2022-08-06 DIAGNOSIS — N2581 Secondary hyperparathyroidism of renal origin: Secondary | ICD-10-CM | POA: Diagnosis not present

## 2022-08-07 DIAGNOSIS — D509 Iron deficiency anemia, unspecified: Secondary | ICD-10-CM | POA: Diagnosis not present

## 2022-08-07 DIAGNOSIS — E440 Moderate protein-calorie malnutrition: Secondary | ICD-10-CM | POA: Diagnosis not present

## 2022-08-07 DIAGNOSIS — N2581 Secondary hyperparathyroidism of renal origin: Secondary | ICD-10-CM | POA: Diagnosis not present

## 2022-08-07 DIAGNOSIS — Z992 Dependence on renal dialysis: Secondary | ICD-10-CM | POA: Diagnosis not present

## 2022-08-07 DIAGNOSIS — N186 End stage renal disease: Secondary | ICD-10-CM | POA: Diagnosis not present

## 2022-08-07 DIAGNOSIS — D631 Anemia in chronic kidney disease: Secondary | ICD-10-CM | POA: Diagnosis not present

## 2022-08-08 ENCOUNTER — Ambulatory Visit (HOSPITAL_COMMUNITY)
Admission: RE | Admit: 2022-08-08 | Discharge: 2022-08-08 | Disposition: A | Discharge status: Home / Self Care | Payer: Medicare Other | Source: Ambulatory Visit | Attending: Physician Assistant | Admitting: Physician Assistant

## 2022-08-08 ENCOUNTER — Encounter (HOSPITAL_COMMUNITY): Payer: Self-pay | Admitting: Physician Assistant

## 2022-08-08 ENCOUNTER — Ambulatory Visit (INDEPENDENT_AMBULATORY_CARE_PROVIDER_SITE_OTHER): Payer: Medicare Other

## 2022-08-08 VITALS — BP 134/80 | HR 58 | Ht 77.0 in | Wt 341.4 lb

## 2022-08-08 VITALS — Ht 77.0 in | Wt 339.0 lb

## 2022-08-08 DIAGNOSIS — N186 End stage renal disease: Secondary | ICD-10-CM | POA: Insufficient documentation

## 2022-08-08 DIAGNOSIS — I10 Essential (primary) hypertension: Secondary | ICD-10-CM | POA: Insufficient documentation

## 2022-08-08 DIAGNOSIS — G4733 Obstructive sleep apnea (adult) (pediatric): Secondary | ICD-10-CM | POA: Diagnosis not present

## 2022-08-08 DIAGNOSIS — E669 Obesity, unspecified: Secondary | ICD-10-CM | POA: Diagnosis not present

## 2022-08-08 DIAGNOSIS — Z0000 Encounter for general adult medical examination without abnormal findings: Secondary | ICD-10-CM

## 2022-08-08 DIAGNOSIS — Z6841 Body Mass Index (BMI) 40.0 and over, adult: Secondary | ICD-10-CM | POA: Diagnosis not present

## 2022-08-08 DIAGNOSIS — I4819 Other persistent atrial fibrillation: Secondary | ICD-10-CM | POA: Insufficient documentation

## 2022-08-08 DIAGNOSIS — I38 Endocarditis, valve unspecified: Secondary | ICD-10-CM | POA: Insufficient documentation

## 2022-08-08 LAB — COMPREHENSIVE METABOLIC PANEL
ALT: 26 U/L (ref 0–44)
AST: 14 U/L — ABNORMAL LOW (ref 15–41)
Albumin: 3.8 g/dL (ref 3.5–5.0)
Alkaline Phosphatase: 53 U/L (ref 38–126)
Anion gap: 7 (ref 5–15)
BUN: 54 mg/dL — ABNORMAL HIGH (ref 6–20)
CO2: 25 mmol/L (ref 22–32)
Calcium: 8.8 mg/dL — ABNORMAL LOW (ref 8.9–10.3)
Chloride: 108 mmol/L (ref 98–111)
Creatinine, Ser: 14.61 mg/dL — ABNORMAL HIGH (ref 0.61–1.24)
GFR, Estimated: 4 mL/min — ABNORMAL LOW (ref >60)
Glucose, Bld: 81 mg/dL (ref 70–99)
Potassium: 5.6 mmol/L — ABNORMAL HIGH (ref 3.5–5.1)
Sodium: 140 mmol/L (ref 135–145)
Total Bilirubin: 0.5 mg/dL (ref 0.3–1.2)
Total Protein: 6.5 g/dL (ref 6.5–8.1)

## 2022-08-08 LAB — TSH: TSH: 3.685 u[IU]/mL (ref 0.350–4.500)

## 2022-08-08 NOTE — Patient Instructions (Signed)
Eric Douglas , Thank you for taking time to come for your Medicare Wellness Visit. I appreciate your ongoing commitment to your health goals. Please review the following plan we discussed and let me know if I can assist you in the future.   Screening recommendations/referrals: Colonoscopy: 06/30/20 Recommended yearly ophthalmology/optometry visit for glaucoma screening and checkup Recommended yearly dental visit for hygiene and checkup  Vaccinations: Influenza vaccine: 07/20/21 Pneumococcal vaccine: 12/19/17 Tdap vaccine: 12/03/18 Shingles vaccine: n/d   Covid-19: 12/28/19, 01/19/20  Advanced directives: no  Conditions/risks identified: none  Next appointment: Follow up in one year for your annual wellness visit 08/12/23 @ 2 pm by phone  Preventive Care 40-64 Years, Male Preventive care refers to lifestyle choices and visits with your health care provider that can promote health and wellness. What does preventive care include? A yearly physical exam. This is also called an annual well check. Dental exams once or twice a year. Routine eye exams. Ask your health care provider how often you should have your eyes checked. Personal lifestyle choices, including: Daily care of your teeth and gums. Regular physical activity. Eating a healthy diet. Avoiding tobacco and drug use. Limiting alcohol use. Practicing safe sex. Taking low-dose aspirin every day starting at age 93. What happens during an annual well check? The services and screenings done by your health care provider during your annual well check will depend on your age, overall health, lifestyle risk factors, and family history of disease. Counseling  Your health care provider may ask you questions about your: Alcohol use. Tobacco use. Drug use. Emotional well-being. Home and relationship well-being. Sexual activity. Eating habits. Work and work Statistician. Screening  You may have the following tests or measurements: Height,  weight, and BMI. Blood pressure. Lipid and cholesterol levels. These may be checked every 5 years, or more frequently if you are over 66 years old. Skin check. Lung cancer screening. You may have this screening every year starting at age 31 if you have a 30-pack-year history of smoking and currently smoke or have quit within the past 15 years. Fecal occult blood test (FOBT) of the stool. You may have this test every year starting at age 26. Flexible sigmoidoscopy or colonoscopy. You may have a sigmoidoscopy every 5 years or a colonoscopy every 10 years starting at age 93. Prostate cancer screening. Recommendations will vary depending on your family history and other risks. Hepatitis C blood test. Hepatitis B blood test. Sexually transmitted disease (STD) testing. Diabetes screening. This is done by checking your blood sugar (glucose) after you have not eaten for a while (fasting). You may have this done every 1-3 years. Discuss your test results, treatment options, and if necessary, the need for more tests with your health care provider. Vaccines  Your health care provider may recommend certain vaccines, such as: Influenza vaccine. This is recommended every year. Tetanus, diphtheria, and acellular pertussis (Tdap, Td) vaccine. You may need a Td booster every 10 years. Zoster vaccine. You may need this after age 39. Pneumococcal 13-valent conjugate (PCV13) vaccine. You may need this if you have certain conditions and have not been vaccinated. Pneumococcal polysaccharide (PPSV23) vaccine. You may need one or two doses if you smoke cigarettes or if you have certain conditions. Talk to your health care provider about which screenings and vaccines you need and how often you need them. This information is not intended to replace advice given to you by your health care provider. Make sure you discuss any questions you have  with your health care provider. Document Released: 10/28/2015 Document Revised:  06/20/2016 Document Reviewed: 08/02/2015 Elsevier Interactive Patient Education  2017 [employer] Prevention in the Home Falls can cause injuries. They can happen to people of all ages. There are many things you can do to make your home safe and to help prevent falls. What can I do on the outside of my home? Regularly fix the edges of walkways and driveways and fix any cracks. Remove anything that might make you trip as you walk through a door, such as a raised step or threshold. Trim any bushes or trees on the path to your home. Use bright outdoor lighting. Clear any walking paths of anything that might make someone trip, such as rocks or tools. Regularly check to see if handrails are loose or broken. Make sure that both sides of any steps have handrails. Any raised decks and porches should have guardrails on the edges. Have any leaves, snow, or ice cleared regularly. Use sand or salt on walking paths during winter. Clean up any spills in your garage right away. This includes oil or grease spills. What can I do in the bathroom? Use night lights. Install grab bars by the toilet and in the tub and shower. Do not use towel bars as grab bars. Use non-skid mats or decals in the tub or shower. If you need to sit down in the shower, use a plastic, non-slip stool. Keep the floor dry. Clean up any water that spills on the floor as soon as it happens. Remove soap buildup in the tub or shower regularly. Attach bath mats securely with double-sided non-slip rug tape. Do not have throw rugs and other things on the floor that can make you trip. What can I do in the bedroom? Use night lights. Make sure that you have a light by your bed that is easy to reach. Do not use any sheets or blankets that are too big for your bed. They should not hang down onto the floor. Have a firm chair that has side arms. You can use this for support while you get dressed. Do not have throw rugs and other things  on the floor that can make you trip. What can I do in the kitchen? Clean up any spills right away. Avoid walking on wet floors. Keep items that you use a lot in easy-to-reach places. If you need to reach something above you, use a strong step stool that has a grab bar. Keep electrical cords out of the way. Do not use floor polish or wax that makes floors slippery. If you must use wax, use non-skid floor wax. Do not have throw rugs and other things on the floor that can make you trip. What can I do with my stairs? Do not leave any items on the stairs. Make sure that there are handrails on both sides of the stairs and use them. Fix handrails that are broken or loose. Make sure that handrails are as long as the stairways. Check any carpeting to make sure that it is firmly attached to the stairs. Fix any carpet that is loose or worn. Avoid having throw rugs at the top or bottom of the stairs. If you do have throw rugs, attach them to the floor with carpet tape. Make sure that you have a light switch at the top of the stairs and the bottom of the stairs. If you do not have them, ask someone to add them for you. What  else can I do to help prevent falls? Wear shoes that: Do not have high heels. Have rubber bottoms. Are comfortable and fit you well. Are closed at the toe. Do not wear sandals. If you use a stepladder: Make sure that it is fully opened. Do not climb a closed stepladder. Make sure that both sides of the stepladder are locked into place. Ask someone to hold it for you, if possible. Clearly mark and make sure that you can see: Any grab bars or handrails. First and last steps. Where the edge of each step is. Use tools that help you move around (mobility aids) if they are needed. These include: Canes. Walkers. Scooters. Crutches. Turn on the lights when you go into a dark area. Replace any light bulbs as soon as they burn out. Set up your furniture so you have a clear path. Avoid  moving your furniture around. If any of your floors are uneven, fix them. If there are any pets around you, be aware of where they are. Review your medicines with your doctor. Some medicines can make you feel dizzy. This can increase your chance of falling. Ask your doctor what other things that you can do to help prevent falls. This information is not intended to replace advice given to you by your health care provider. Make sure you discuss any questions you have with your health care provider. Document Released: 07/28/2009 Document Revised: 03/08/2016 Document Reviewed: 11/05/2014 Elsevier Interactive Patient Education  2017 [employer].

## 2022-08-08 NOTE — Progress Notes (Signed)
Primary Care Physician: Michela Pitcher, NP Primary Cardiologist: Dr Johnsie Cancel Primary Electrophysiologist: Dr Quentin Ore Referring Physician: Dr Eunice Blase Eric Douglas is a 51 y.o. male with a history of HTN, ESRD, atrial fibrillation who presents for follow up in the [facility]. The patient was initially diagnosed with atrial fibrillation 10/22/21 after presenting to the ED with chest discomfort. He spontaneously converted back to SR. He was placed on a 30 day event monitor to evaluate arrhythmia burden. The monitor detected afib on 10/30/21 and he was started on Eliquis for a CHADS2VASC score of 1. Patient reports that since 11/19/21 he has had near constant tachypalpitations and fatigue. Patient is s/p DCCV on 12/21/21.   On follow up today, patient reports that he has done well since his last visit. He has not had any symptoms of afib. He denies any bleeding issues on anticoagulation.   Today, he denies symptoms of palpitations, chest pain, PND, lower extremity edema, dizziness, presyncope, syncope, bleeding, or neurologic sequela. The patient is tolerating medications without difficulties and is otherwise without complaint today.    Atrial Fibrillation Risk Factors:  he does have symptoms or diagnosis of sleep apnea. he is compliant with CPAP therapy.  he does not have a history of rheumatic fever.   he has a BMI of Body mass index is 40.48 kg/m.Marland Kitchen Filed Weights   08/08/22 1514  Weight: (!) 154.9 kg    Family History  Problem Relation Age of Onset   Cancer Paternal Grandfather 23       brain cancer   Hypertension Other        GP   Stroke Other    Diabetes Other        DM - GP    Heart disease Neg Hx      Atrial Fibrillation Management history:  Previous antiarrhythmic drugs: amiodarone  Previous cardioversions: 12/21/21 Previous ablations: none CHADS2VASC score: 1 Anticoagulation history: Eliquis   Past Medical History:  Diagnosis Date    Fibula fracture    ORIF 1990   Gout    non crystal proven    History of chickenpox    History of kidney disease    HTN (hypertension)    Nephropathy    Ig A; dx 2002. micropscopic hematuria and proteinuria   Tibia fracture    ORIF 1990   Varicocele    L testes   Venous incompetence    R leg   Past Surgical History:  Procedure Laterality Date   APPENDECTOMY  1982   AV FISTULA PLACEMENT Left 06/27/2017   Procedure: LEFT ARM ARTERIOVENOUS (AV) FISTULA CREATION;  Surgeon: Waynetta Sandy, MD;  Location: [facility];  Service: Vascular;  Laterality: Left;   CARDIOVERSION N/A 12/21/2021   Procedure: CARDIOVERSION;  Surgeon: Sanda Klein, MD;  Location: [facility];  Service: Cardiovascular;  Laterality: N/A;   stab phlebectomy Left 12/17/2016   stab phlebectomy >20 incisions (left leg) by Tinnie Gens MD     Current Outpatient Medications  Medication Sig Dispense Refill   allopurinol (ZYLOPRIM) 100 MG tablet Take 100 mg by mouth every morning.  2   amiodarone (PACERONE) 200 MG tablet Take 1 tablet (200 mg total) by mouth daily. 90 tablet 1   amLODipine (NORVASC) 2.5 MG tablet TAKE 1 TABLET BY MOUTH EVERYDAY AT BEDTIME     calcitRIOL (ROCALTROL) 0.25 MCG capsule Take 0.5 mcg by mouth every morning.     calcium acetate (PHOSLO) 667 MG tablet Take  by mouth.     Calcium Polycarbophil (FIBERCON PO) Take 2 tablets by mouth every morning.     cinacalcet (SENSIPAR) 30 MG tablet Take 30 mg by mouth daily.     ELIQUIS 5 MG TABS tablet TAKE 1 TABLET BY MOUTH TWICE A DAY 60 tablet 6   eszopiclone (LUNESTA) 2 MG TABS tablet Take 1 tablet (2 mg total) by mouth at bedtime as needed for sleep. Take immediately before bedtime 30 tablet 3   gabapentin (NEURONTIN) 300 MG capsule Take 300 mg by mouth at bedtime.     lactulose (CHRONULAC) 10 GM/15ML solution Take by mouth.     Methoxy PEG-Epoetin Beta (MIRCERA IJ) Inject into the skin.     metoprolol tartrate (LOPRESSOR) 50 MG tablet Take 1  tablet (50 mg total) by mouth at bedtime. May take extra tablet for breakthrough afib 200 tablet 3   mupirocin ointment (BACTROBAN) 2 % Apply 1 application topically daily as needed (port care).     sevelamer carbonate (RENVELA) 800 MG tablet TAKE 6 TABLETS BY MOUTH 3 TIMES A DAY WITH MEALS THEN 6 PILLS WITH MEALS TOTAL OF 18 PER DAY     sucroferric oxyhydroxide (VELPHORO) 500 MG chewable tablet Chew 500-1,000 mg by mouth See admin instructions. Chew 2 tablets (1000 mg) by mouth once or twice daily with meals, 500 mg with snacks     tamsulosin (FLOMAX) 0.4 MG CAPS capsule Take 0.4 mg by mouth daily.     testosterone cypionate (DEPOTESTOSTERONE CYPIONATE) 200 MG/ML injection SMARTSIG:0.5 Milliliter(s) IM Once a Week     No current facility-administered medications for this encounter.    No Known Allergies  Social History   Socioeconomic History   Marital status: Divorced    Spouse name: Not on file   Number of children: 2   Years of education: Not on file   Highest education level: Not on file  Occupational History   Occupation: Service Tech    Comment: Charity fundraiser  Tobacco Use   Smoking status: Never   Smokeless tobacco: Never   Tobacco comments:    Never smoke 12/28/21  Vaping Use   Vaping Use: Never used  Substance and Sexual Activity   Alcohol use: No    Alcohol/week: 0.0 standard drinks of alcohol   Drug use: No   Sexual activity: Yes  Other Topics Concern   Not on file  Social History Narrative   Divorced, primary custody of son; Charity fundraiser; gets regular exercise.       Pt signed designated party release and gives no access to medical records. Can leave msg on cell [phone].    Social Determinants of Health   Financial Resource Strain: Low Risk  (08/08/2022)   Overall Financial Resource Strain (CARDIA)    Difficulty of Paying Living Expenses: Not hard at all  Food Insecurity: No Food Insecurity (08/08/2022)   Hunger Vital Sign    Worried About  Running Out of Food in the Last Year: Never true    Ran Out of Food in the Last Year: Never true  Transportation Needs: No Transportation Needs (08/08/2022)   PRAPARE - Hydrologist (Medical): No    Lack of Transportation (Non-Medical): No  Physical Activity: Inactive (08/08/2022)   Exercise Vital Sign    Days of Exercise per Week: 0 days    Minutes of Exercise per Session: 0 min  Stress: No Stress Concern Present (08/08/2022)   [employer] -  Occupational Stress Questionnaire    Feeling of Stress : Not at all  Social Connections: Moderately Isolated (08/08/2022)   Social Connection and Isolation Panel [NHANES]    Frequency of Communication with Friends and Family: More than three times a week    Frequency of Social Gatherings with Friends and Family: More than three times a week    Attends Religious Services: More than 4 times per year    Active Member of [employer] or Organizations: No    Attends Archivist Meetings: Never    Marital Status: Divorced  Human resources officer Violence: Not At Risk (08/08/2022)   Humiliation, Afraid, Rape, and Kick questionnaire    Fear of Current or Ex-Partner: No    Emotionally Abused: No    Physically Abused: No    Sexually Abused: No     ROS- All systems are reviewed and negative except as per the HPI above.  Physical Exam: Vitals:   08/08/22 1514  BP: 134/80  Pulse: (!) 58  Weight: (!) 154.9 kg  Height: '6\' 5"'$  (1.956 m)    GEN- The patient is a well appearing obese male, alert and oriented x 3 today.   HEENT-head normocephalic, atraumatic, sclera clear, conjunctiva pink, hearing intact, trachea midline. Lungs- Clear to ausculation bilaterally, normal work of breathing Heart- Regular rate and rhythm, no murmurs, rubs or gallops  GI- soft, NT, ND, + BS Extremities- no clubbing, cyanosis, or edema MS- no significant deformity or atrophy Skin- no rash or lesion Psych- euthymic  mood, full affect Neuro- strength and sensation are intact   Wt Readings from Last 3 Encounters:  08/08/22 (!) 154.9 kg  08/08/22 (!) 153.8 kg  06/14/22 (!) 153.8 kg    EKG today demonstrates  SB Vent. rate 58 BPM PR interval 176 ms QRS duration 92 ms QT/QTcB 428/420 ms  Echo 10/23/21 demonstrated   1. Left ventricular ejection fraction, by estimation, is 55 to 60%. The  left ventricle has normal function. The left ventricle has no regional  wall motion abnormalities. The left ventricular internal cavity size was mildly dilated. There is mild left ventricular hypertrophy. Left ventricular diastolic parameters were normal.   2. Right ventricular systolic function is normal. The right ventricular  size is mildly enlarged. There is moderately elevated pulmonary artery systolic pressure. The estimated right ventricular systolic pressure is 32.4 mmHg.   3. Left atrial size was mildly dilated.   4. The aortic valve is tricuspid. Aortic valve regurgitation is not  visualized. Aortic valve sclerosis/calcification is present, without any  evidence of aortic stenosis.   5. The inferior vena cava is dilated in size with <50% respiratory  variability, suggesting right atrial pressure of 15 mmHg.   6. The mitral valve is abnormal. Moderate leaflet thickening. Moderate mitral valve regurgitation. MR jet is not well visualized on long axis images, but appears more significant on MV short axis image (image 17). Does have LA/LV dilatation. Consider TEE vs cardiac MRI for further evaluation   Epic records are reviewed at length today  CHA2DS2-VASc Score = 1  The patient's score is based upon: CHF History: 0 HTN History: 1 Diabetes History: 0 Stroke History: 0 Vascular Disease History: 0 Age Score: 0 Gender Score: 0       ASSESSMENT AND PLAN: 1. Persistent atrial fibrillation  The patient's CHA2DS2-VASc score is 1, indicating a 0.6% annual risk of stroke.   Patient appears to be  maintaining SR.  Continue Lopressor 50 mg daily (had hypotension and fatigue  at higher doses) His rhythm control options are limited with ESRD. Continue amiodarone 200 mg daily  Check cmet/tsh today. Continue Eliquis 5 mg BID (weight >60 kg age < 43)  2. HTN Stable, no changes today.  3. Obesity Body mass index is 40.48 kg/m. Lifestyle modification was discussed and encouraged including regular physical activity and weight reduction.  4. Obstructive sleep apnea Severe OSA Followed by Dr Ander Slade Encouraged compliance with CPAP therapy.  5. ESRD Followed by Dr Moshe Cipro  On transplant list.  6. Valvular heart disease Moderate MR   Follow up in the AF clinic in 6 months.    [facility] [address] [place], [place] [address] [phone] 08/08/2022 3:37 PM

## 2022-08-08 NOTE — Progress Notes (Signed)
Virtual Visit via Telephone Note  I connected with  Eric Douglas on 08/08/22 at  9:30 AM EDT by telephone and verified that I am speaking with the correct person using two identifiers.  Location: Patient: home Provider: [place] Persons participating in the virtual visit: [place]   I discussed the limitations, risks, security and privacy concerns of performing an evaluation and management service by telephone and the availability of in person appointments. The patient expressed understanding and agreed to proceed.  Interactive audio and video telecommunications were attempted between this nurse and patient, however failed, due to patient having technical difficulties OR patient did not have access to video capability.  We continued and completed visit with audio only.  Some vital signs may be absent or patient reported.   Dionisio David, LPN  Subjective:   Eric Douglas is a 51 y.o. male who presents for Medicare Annual/Subsequent preventive examination.  Review of Systems     Cardiac Risk Factors include: advanced age (>55mn, >>53women);hypertension;male gender     Objective:    There were no vitals filed for this visit. There is no height or weight on file to calculate BMI.     08/08/2022    9:35 AM 12/21/2021   10:15 AM 10/22/2021    1:05 PM 02/28/2020    9:59 AM 10/27/2019   10:01 AM 06/14/2019    9:02 AM 08/09/2017   11:11 AM  Advanced Directives  Does Patient Have a Medical Advance Directive? No No No No No No No  Would patient like information on creating a medical advance directive? No - Patient declined Yes (MAU/Ambulatory/Procedural Areas - Information given)    No - Guardian declined     Current Medications (verified) Outpatient Encounter Medications as of 08/08/2022  Medication Sig   allopurinol (ZYLOPRIM) 100 MG tablet Take 100 mg by mouth every morning.   amiodarone (PACERONE) 200 MG tablet Take 1 tablet (200 mg  total) by mouth daily.   amLODipine (NORVASC) 2.5 MG tablet TAKE 1 TABLET BY MOUTH EVERYDAY AT BEDTIME   apixaban (ELIQUIS) 2.5 MG TABS tablet Take 1 tablet by mouth 2 (two) times daily.   calcitRIOL (ROCALTROL) 0.25 MCG capsule Take 0.5 mcg by mouth every morning.   calcitRIOL (ROCALTROL) 0.5 MCG capsule Take 1 tablet by mouth daily.   calcium acetate (PHOSLO) 667 MG tablet Take by mouth.   Calcium Polycarbophil (FIBERCON PO) Take 2 tablets by mouth every morning.   cinacalcet (SENSIPAR) 30 MG tablet Take 30 mg by mouth daily.   cinacalcet (SENSIPAR) 60 MG tablet Take by mouth.   cinacalcet (SENSIPAR) 90 MG tablet Take 90 mg by mouth daily.   ELIQUIS 5 MG TABS tablet TAKE 1 TABLET BY MOUTH TWICE A DAY   eszopiclone (LUNESTA) 2 MG TABS tablet Take 1 tablet (2 mg total) by mouth at bedtime as needed for sleep. Take immediately before bedtime   gabapentin (NEURONTIN) 100 MG capsule SMARTSIG:1 Capsule(s) By Mouth   gabapentin (NEURONTIN) 300 MG capsule Take 300 mg by mouth at bedtime.   lactulose (CHRONULAC) 10 GM/15ML solution Take by mouth.   metoprolol tartrate (LOPRESSOR) 25 MG tablet    metoprolol tartrate (LOPRESSOR) 50 MG tablet Take 1 tablet (50 mg total) by mouth at bedtime. May take extra tablet for breakthrough afib   mupirocin ointment (BACTROBAN) 2 % Apply 1 application topically daily as needed (port care).   sevelamer carbonate (RENVELA) 800 MG tablet TAKE 6 TABLETS BY MOUTH  3 TIMES A DAY WITH MEALS THEN 6 PILLS WITH MEALS TOTAL OF 18 PER DAY   sucroferric oxyhydroxide (VELPHORO) 500 MG chewable tablet Chew 500-1,000 mg by mouth See admin instructions. Chew 2 tablets (1000 mg) by mouth once or twice daily with meals, 500 mg with snacks   tamsulosin (FLOMAX) 0.4 MG CAPS capsule Take 0.4 mg by mouth daily.   testosterone cypionate (DEPOTESTOSTERONE CYPIONATE) 200 MG/ML injection SMARTSIG:0.5 Milliliter(s) IM Once a Week   testosterone cypionate (DEPOTESTOTERONE CYPIONATE) 100 MG/ML  injection Inject 100 mg into the muscle every 7 (seven) days. For IM use only   amitriptyline (ELAVIL) 25 MG tablet TAKE 2 TABLETS BY MOUTH AT BEDTIME AS NEEDED (Patient not taking: Reported on 08/08/2022)   furosemide (LASIX) 40 MG tablet Take 1 tablet by mouth every morning. (Patient not taking: Reported on 08/08/2022)   Methoxy PEG-Epoetin Beta (MIRCERA IJ) Inject into the skin. (Patient not taking: Reported on 08/08/2022)   [DISCONTINUED] allopurinol (ZYLOPRIM) 100 MG tablet Take 1 tablet by mouth daily.   [DISCONTINUED] amiodarone (PACERONE) 200 MG tablet Take 1 tablet by mouth 2 (two) times daily.   [DISCONTINUED] calcitRIOL (ROCALTROL) 0.25 MCG capsule Take 3 capsules by mouth daily.   [DISCONTINUED] gabapentin (NEURONTIN) 300 MG capsule Take 1 capsule by mouth 2 (two) times daily.   No facility-administered encounter medications on file as of 08/08/2022.    Allergies (verified) Patient has no known allergies.   History: Past Medical History:  Diagnosis Date   Fibula fracture    ORIF 1990   Gout    non crystal proven    History of chickenpox    History of kidney disease    HTN (hypertension)    Nephropathy    Ig A; dx 2002. micropscopic hematuria and proteinuria   Tibia fracture    ORIF 1990   Varicocele    L testes   Venous incompetence    R leg   Past Surgical History:  Procedure Laterality Date   APPENDECTOMY  1982   AV FISTULA PLACEMENT Left 06/27/2017   Procedure: LEFT ARM ARTERIOVENOUS (AV) FISTULA CREATION;  Surgeon: Waynetta Sandy, MD;  Location: [place];  Service: Vascular;  Laterality: Left;   CARDIOVERSION N/A 12/21/2021   Procedure: CARDIOVERSION;  Surgeon: Sanda Klein, MD;  Location: [facility];  Service: Cardiovascular;  Laterality: N/A;   stab phlebectomy Left 12/17/2016   stab phlebectomy >20 incisions (left leg) by Tinnie Gens MD    Family History  Problem Relation Age of Onset   Cancer Paternal Grandfather 67       brain cancer    Hypertension Other        GP   Stroke Other    Diabetes Other        DM - GP    Heart disease Neg Hx    Social History   Socioeconomic History   Marital status: Divorced    Spouse name: Not on file   Number of children: 2   Years of education: Not on file   Highest education level: Not on file  Occupational History   Occupation: Service Tech    Comment: Charity fundraiser  Tobacco Use   Smoking status: Never   Smokeless tobacco: Never   Tobacco comments:    Never smoke 12/28/21  Vaping Use   Vaping Use: Never used  Substance and Sexual Activity   Alcohol use: No    Alcohol/week: 0.0 standard drinks of alcohol   Drug use: No  Sexual activity: Yes  Other Topics Concern   Not on file  Social History Narrative   Divorced, primary custody of son; Charity fundraiser; gets regular exercise.       Pt signed designated party release and gives no access to medical records. Can leave msg on cell [phone].    Social Determinants of Health   Financial Resource Strain: Low Risk  (08/08/2022)   Overall Financial Resource Strain (CARDIA)    Difficulty of Paying Living Expenses: Not hard at all  Food Insecurity: No Food Insecurity (08/08/2022)   Hunger Vital Sign    Worried About Running Out of Food in the Last Year: Never true    Ran Out of Food in the Last Year: Never true  Transportation Needs: No Transportation Needs (08/08/2022)   PRAPARE - Hydrologist (Medical): No    Lack of Transportation (Non-Medical): No  Physical Activity: Inactive (08/08/2022)   Exercise Vital Sign    Days of Exercise per Week: 0 days    Minutes of Exercise per Session: 0 min  Stress: No Stress Concern Present (08/08/2022)   [place]    Feeling of Stress : Not at all  Social Connections: Moderately Isolated (08/08/2022)   Social Connection and Isolation Panel [NHANES]    Frequency of  Communication with Friends and Family: More than three times a week    Frequency of Social Gatherings with Friends and Family: More than three times a week    Attends Religious Services: More than 4 times per year    Active Member of [employer] or Organizations: No    Attends Archivist Meetings: Never    Marital Status: Divorced    Tobacco Counseling Counseling given: Not Answered Tobacco comments: Never smoke 12/28/21   Clinical Intake:  Pre-visit preparation completed: Yes  Pain : No/denies pain     Nutritional Risks: None Diabetes: No  How often do you need to have someone help you when you read instructions, pamphlets, or other written materials from your doctor or pharmacy?: 1 - Never  Diabetic?no  Interpreter Needed?: No  Information entered by :: Kirke Shaggy, LPN   Activities of Daily Living    08/08/2022    9:36 AM 10/22/2021   10:00 PM  In your present state of health, do you have any difficulty performing the following activities:  Hearing? 0 0  Vision? 0 0  Difficulty concentrating or making decisions? 0 0  Walking or climbing stairs? 0 0  Dressing or bathing? 0 0  Doing errands, shopping? 0 0  Preparing Food and eating ? N   Using the Toilet? N   In the past six months, have you accidently leaked urine? N   Do you have problems with loss of bowel control? N   Managing your Medications? N   Managing your Finances? N   Housekeeping or managing your Housekeeping? N     Patient Care Team: Michela Pitcher, NP as PCP - General (Pain Medicine) Corliss Parish, MD as Consulting Physician (Nephrology) Charlton Haws, [facility] as Pharmacist (Pharmacist)  Indicate any recent Medical Services you may have received from other than [facility] providers in the past year (date may be approximate).     Assessment:   This is a routine wellness examination for [place].  Hearing/Vision screen Hearing Screening - Comments:: No aids Vision Screening -  Comments:: Readers- [facility]  Dietary issues and exercise activities  discussed: Current Exercise Habits: The patient does not participate in regular exercise at present   Goals Addressed             This Visit's Progress    DIET - EAT MORE FRUITS AND VEGETABLES         Depression Screen    08/08/2022    9:34 AM 06/08/2021    8:11 AM 07/24/2019    3:20 PM 06/05/2017    2:48 PM 05/03/2016   10:41 AM 01/24/2016    9:21 AM  PHQ 2/9 Scores  PHQ - 2 Score 0 0 0 0 0 0  PHQ- 9 Score 0         Fall Risk    08/08/2022    9:36 AM 06/05/2017    2:48 PM 05/03/2016   10:41 AM 01/24/2016    9:21 AM  [place] in the past year? 0 No No No  Number falls in past yr: 0     Injury with Fall? 0     Risk for fall due to : No Fall Risks     Follow up Falls prevention discussed;Falls evaluation completed       FALL RISK PREVENTION PERTAINING TO THE HOME:  Any stairs in or around the home? No  If so, are there any without handrails? No  Home free of loose throw rugs in walkways, pet beds, electrical cords, etc? Yes  Adequate lighting in your home to reduce risk of falls? Yes   ASSISTIVE DEVICES UTILIZED TO PREVENT FALLS:  Life alert? No  Use of a cane, walker or w/c? No  Grab bars in the bathroom? No  Shower chair or bench in shower? No  Elevated toilet seat or a handicapped toilet? No   Cognitive Function:        08/08/2022    9:36 AM  6CIT Screen  What Year? 0 points  What month? 0 points  What time? 0 points  Count back from 20 0 points  Months in reverse 0 points  Repeat phrase 0 points  Total Score 0 points    Immunizations Immunization History  Administered Date(s) Administered   Hepatitis B, adult 01/03/2018, 03/10/2018, 04/09/2018, 06/18/2018, 11/13/2019, 12/14/2019   Hepb-cpg 05/24/2022, 07/11/2022   Influenza,inj,Quad PF,6+ Mos 07/24/2019, 07/20/2021   Influenza-Unspecified 06/15/2018, 07/24/2019   PFIZER(Purple Top)SARS-COV-2 Vaccination  12/28/2019, 01/19/2020   Pneumococcal Conjugate-13 12/19/2017   Td 12/02/2008   Tdap 12/03/2018    TDAP status: Up to date  Flu Vaccine status: Up to date  Pneumococcal vaccine status: Due, Education has been provided regarding the importance of this vaccine. Advised may receive this vaccine at local pharmacy or Health Dept. Aware to provide a copy of the vaccination record if obtained from local pharmacy or Health Dept. Verbalized acceptance and understanding.  Covid-19 vaccine status: Completed vaccines  Qualifies for Shingles Vaccine? Yes   Zostavax completed No   Shingrix Completed?: No.    Education has been provided regarding the importance of this vaccine. Patient has been advised to call insurance company to determine out of pocket expense if they have not yet received this vaccine. Advised may also receive vaccine at local pharmacy or Health Dept. Verbalized acceptance and understanding.  Screening Tests Health Maintenance  Topic Date Due   Hepatitis C Screening  Never done   Zoster Vaccines- Shingrix (1 of 2) Never done   COVID-19 Vaccine (3 - Pfizer risk series) 02/16/2020   INFLUENZA VACCINE  05/15/2022   Medicare  Annual Wellness (AWV)  09/08/2023   COLONOSCOPY (Pts 45-95yr Insurance coverage will need to be confirmed)  07/01/2027   TETANUS/TDAP  12/03/2028   HIV Screening  Completed   HPV VACCINES  Aged Out    Health Maintenance  Health Maintenance Due  Topic Date Due   Hepatitis C Screening  Never done   Zoster Vaccines- Shingrix (1 of 2) Never done   COVID-19 Vaccine (3 - Pfizer risk series) 02/16/2020   INFLUENZA VACCINE  05/15/2022    Colorectal cancer screening: Type of screening: Colonoscopy. Completed 06/30/20. Repeat every 7 years  Lung Cancer Screening: (Low Dose CT Chest recommended if Age 52-80years, 30 pack-year currently smoking OR have quit w/in 15years.) does not qualify.   Additional Screening:  Hepatitis C Screening: does qualify;  Completed no  Vision Screening: Recommended annual ophthalmology exams for early detection of glaucoma and other disorders of the eye. Is the patient up to date with their annual eye exam?  Yes  Who is the provider or what is the name of the office in which the patient attends annual eye exams? [facility] If pt is not established with a provider, would they like to be referred to a provider to establish care? No .   Dental Screening: Recommended annual dental exams for proper oral hygiene  Community Resource Referral / Chronic Care Management: CRR required this visit?  No   CCM required this visit?  No      Plan:     I have personally reviewed and noted the following in the patient's chart:   Medical and social history Use of alcohol, tobacco or illicit drugs  Current medications and supplements including opioid prescriptions. Patient is not currently taking opioid prescriptions. Functional ability and status Nutritional status Physical activity Advanced directives List of other physicians Hospitalizations, surgeries, and ER visits in previous 12 months Vitals Screenings to include cognitive, depression, and falls Referrals and appointments  In addition, I have reviewed and discussed with patient certain preventive protocols, quality metrics, and best practice recommendations. A written personalized care plan for preventive services as well as general preventive health recommendations were provided to patient.     LDionisio David LPN   [id number]  Nurse Notes: none

## 2022-08-09 DIAGNOSIS — D631 Anemia in chronic kidney disease: Secondary | ICD-10-CM | POA: Diagnosis not present

## 2022-08-09 DIAGNOSIS — Z992 Dependence on renal dialysis: Secondary | ICD-10-CM | POA: Diagnosis not present

## 2022-08-09 DIAGNOSIS — D509 Iron deficiency anemia, unspecified: Secondary | ICD-10-CM | POA: Diagnosis not present

## 2022-08-09 DIAGNOSIS — E440 Moderate protein-calorie malnutrition: Secondary | ICD-10-CM | POA: Diagnosis not present

## 2022-08-09 DIAGNOSIS — N186 End stage renal disease: Secondary | ICD-10-CM | POA: Diagnosis not present

## 2022-08-09 DIAGNOSIS — N2581 Secondary hyperparathyroidism of renal origin: Secondary | ICD-10-CM | POA: Diagnosis not present

## 2022-08-10 DIAGNOSIS — Z992 Dependence on renal dialysis: Secondary | ICD-10-CM | POA: Diagnosis not present

## 2022-08-10 DIAGNOSIS — N186 End stage renal disease: Secondary | ICD-10-CM | POA: Diagnosis not present

## 2022-08-10 DIAGNOSIS — N2581 Secondary hyperparathyroidism of renal origin: Secondary | ICD-10-CM | POA: Diagnosis not present

## 2022-08-10 DIAGNOSIS — D509 Iron deficiency anemia, unspecified: Secondary | ICD-10-CM | POA: Diagnosis not present

## 2022-08-10 DIAGNOSIS — E440 Moderate protein-calorie malnutrition: Secondary | ICD-10-CM | POA: Diagnosis not present

## 2022-08-10 DIAGNOSIS — D631 Anemia in chronic kidney disease: Secondary | ICD-10-CM | POA: Diagnosis not present

## 2022-08-13 DIAGNOSIS — Z992 Dependence on renal dialysis: Secondary | ICD-10-CM | POA: Diagnosis not present

## 2022-08-13 DIAGNOSIS — D509 Iron deficiency anemia, unspecified: Secondary | ICD-10-CM | POA: Diagnosis not present

## 2022-08-13 DIAGNOSIS — D631 Anemia in chronic kidney disease: Secondary | ICD-10-CM | POA: Diagnosis not present

## 2022-08-13 DIAGNOSIS — N2581 Secondary hyperparathyroidism of renal origin: Secondary | ICD-10-CM | POA: Diagnosis not present

## 2022-08-13 DIAGNOSIS — E440 Moderate protein-calorie malnutrition: Secondary | ICD-10-CM | POA: Diagnosis not present

## 2022-08-13 DIAGNOSIS — N186 End stage renal disease: Secondary | ICD-10-CM | POA: Diagnosis not present

## 2022-08-14 DIAGNOSIS — D631 Anemia in chronic kidney disease: Secondary | ICD-10-CM | POA: Diagnosis not present

## 2022-08-14 DIAGNOSIS — N186 End stage renal disease: Secondary | ICD-10-CM | POA: Diagnosis not present

## 2022-08-14 DIAGNOSIS — D509 Iron deficiency anemia, unspecified: Secondary | ICD-10-CM | POA: Diagnosis not present

## 2022-08-14 DIAGNOSIS — N2581 Secondary hyperparathyroidism of renal origin: Secondary | ICD-10-CM | POA: Diagnosis not present

## 2022-08-14 DIAGNOSIS — E440 Moderate protein-calorie malnutrition: Secondary | ICD-10-CM | POA: Diagnosis not present

## 2022-08-14 DIAGNOSIS — Z992 Dependence on renal dialysis: Secondary | ICD-10-CM | POA: Diagnosis not present

## 2022-08-15 DIAGNOSIS — N028 Recurrent and persistent hematuria with other morphologic changes: Secondary | ICD-10-CM | POA: Diagnosis not present

## 2022-08-15 DIAGNOSIS — N186 End stage renal disease: Secondary | ICD-10-CM | POA: Diagnosis not present

## 2022-08-15 DIAGNOSIS — Z992 Dependence on renal dialysis: Secondary | ICD-10-CM | POA: Diagnosis not present

## 2022-08-16 DIAGNOSIS — D509 Iron deficiency anemia, unspecified: Secondary | ICD-10-CM | POA: Diagnosis not present

## 2022-08-16 DIAGNOSIS — I120 Hypertensive chronic kidney disease with stage 5 chronic kidney disease or end stage renal disease: Secondary | ICD-10-CM | POA: Diagnosis not present

## 2022-08-16 DIAGNOSIS — Z23 Encounter for immunization: Secondary | ICD-10-CM | POA: Diagnosis not present

## 2022-08-16 DIAGNOSIS — Z992 Dependence on renal dialysis: Secondary | ICD-10-CM | POA: Diagnosis not present

## 2022-08-16 DIAGNOSIS — D631 Anemia in chronic kidney disease: Secondary | ICD-10-CM | POA: Diagnosis not present

## 2022-08-16 DIAGNOSIS — Z79899 Other long term (current) drug therapy: Secondary | ICD-10-CM | POA: Diagnosis not present

## 2022-08-16 DIAGNOSIS — N186 End stage renal disease: Secondary | ICD-10-CM | POA: Diagnosis not present

## 2022-08-16 DIAGNOSIS — N2581 Secondary hyperparathyroidism of renal origin: Secondary | ICD-10-CM | POA: Diagnosis not present

## 2022-08-16 DIAGNOSIS — E440 Moderate protein-calorie malnutrition: Secondary | ICD-10-CM | POA: Diagnosis not present

## 2022-08-17 DIAGNOSIS — Z992 Dependence on renal dialysis: Secondary | ICD-10-CM | POA: Diagnosis not present

## 2022-08-17 DIAGNOSIS — D631 Anemia in chronic kidney disease: Secondary | ICD-10-CM | POA: Diagnosis not present

## 2022-08-17 DIAGNOSIS — N186 End stage renal disease: Secondary | ICD-10-CM | POA: Diagnosis not present

## 2022-08-17 DIAGNOSIS — D509 Iron deficiency anemia, unspecified: Secondary | ICD-10-CM | POA: Diagnosis not present

## 2022-08-17 DIAGNOSIS — N2581 Secondary hyperparathyroidism of renal origin: Secondary | ICD-10-CM | POA: Diagnosis not present

## 2022-08-17 DIAGNOSIS — Z79899 Other long term (current) drug therapy: Secondary | ICD-10-CM | POA: Diagnosis not present

## 2022-08-19 DIAGNOSIS — D509 Iron deficiency anemia, unspecified: Secondary | ICD-10-CM | POA: Diagnosis not present

## 2022-08-19 DIAGNOSIS — N186 End stage renal disease: Secondary | ICD-10-CM | POA: Diagnosis not present

## 2022-08-19 DIAGNOSIS — D631 Anemia in chronic kidney disease: Secondary | ICD-10-CM | POA: Diagnosis not present

## 2022-08-19 DIAGNOSIS — Z992 Dependence on renal dialysis: Secondary | ICD-10-CM | POA: Diagnosis not present

## 2022-08-19 DIAGNOSIS — N2581 Secondary hyperparathyroidism of renal origin: Secondary | ICD-10-CM | POA: Diagnosis not present

## 2022-08-19 DIAGNOSIS — Z79899 Other long term (current) drug therapy: Secondary | ICD-10-CM | POA: Diagnosis not present

## 2022-08-20 DIAGNOSIS — Z992 Dependence on renal dialysis: Secondary | ICD-10-CM | POA: Diagnosis not present

## 2022-08-20 DIAGNOSIS — D509 Iron deficiency anemia, unspecified: Secondary | ICD-10-CM | POA: Diagnosis not present

## 2022-08-20 DIAGNOSIS — D631 Anemia in chronic kidney disease: Secondary | ICD-10-CM | POA: Diagnosis not present

## 2022-08-20 DIAGNOSIS — N2581 Secondary hyperparathyroidism of renal origin: Secondary | ICD-10-CM | POA: Diagnosis not present

## 2022-08-20 DIAGNOSIS — N186 End stage renal disease: Secondary | ICD-10-CM | POA: Diagnosis not present

## 2022-08-20 DIAGNOSIS — Z79899 Other long term (current) drug therapy: Secondary | ICD-10-CM | POA: Diagnosis not present

## 2022-08-22 ENCOUNTER — Other Ambulatory Visit: Payer: Self-pay | Admitting: Pulmonary Disease

## 2022-08-27 DIAGNOSIS — M1040 Other secondary gout, unspecified site: Secondary | ICD-10-CM | POA: Diagnosis not present

## 2022-08-27 DIAGNOSIS — Z79899 Other long term (current) drug therapy: Secondary | ICD-10-CM | POA: Diagnosis not present

## 2022-08-27 DIAGNOSIS — D509 Iron deficiency anemia, unspecified: Secondary | ICD-10-CM | POA: Diagnosis not present

## 2022-08-27 DIAGNOSIS — N2581 Secondary hyperparathyroidism of renal origin: Secondary | ICD-10-CM | POA: Diagnosis not present

## 2022-08-27 DIAGNOSIS — N186 End stage renal disease: Secondary | ICD-10-CM | POA: Diagnosis not present

## 2022-08-27 DIAGNOSIS — D631 Anemia in chronic kidney disease: Secondary | ICD-10-CM | POA: Diagnosis not present

## 2022-08-27 DIAGNOSIS — Z992 Dependence on renal dialysis: Secondary | ICD-10-CM | POA: Diagnosis not present

## 2022-08-28 DIAGNOSIS — D631 Anemia in chronic kidney disease: Secondary | ICD-10-CM | POA: Diagnosis not present

## 2022-08-28 DIAGNOSIS — N2581 Secondary hyperparathyroidism of renal origin: Secondary | ICD-10-CM | POA: Diagnosis not present

## 2022-08-28 DIAGNOSIS — Z992 Dependence on renal dialysis: Secondary | ICD-10-CM | POA: Diagnosis not present

## 2022-08-28 DIAGNOSIS — Z79899 Other long term (current) drug therapy: Secondary | ICD-10-CM | POA: Diagnosis not present

## 2022-08-28 DIAGNOSIS — D509 Iron deficiency anemia, unspecified: Secondary | ICD-10-CM | POA: Diagnosis not present

## 2022-08-28 DIAGNOSIS — N186 End stage renal disease: Secondary | ICD-10-CM | POA: Diagnosis not present

## 2022-08-30 DIAGNOSIS — Z79899 Other long term (current) drug therapy: Secondary | ICD-10-CM | POA: Diagnosis not present

## 2022-08-30 DIAGNOSIS — Z992 Dependence on renal dialysis: Secondary | ICD-10-CM | POA: Diagnosis not present

## 2022-08-30 DIAGNOSIS — D631 Anemia in chronic kidney disease: Secondary | ICD-10-CM | POA: Diagnosis not present

## 2022-08-30 DIAGNOSIS — N2581 Secondary hyperparathyroidism of renal origin: Secondary | ICD-10-CM | POA: Diagnosis not present

## 2022-08-30 DIAGNOSIS — N186 End stage renal disease: Secondary | ICD-10-CM | POA: Diagnosis not present

## 2022-08-30 DIAGNOSIS — D509 Iron deficiency anemia, unspecified: Secondary | ICD-10-CM | POA: Diagnosis not present

## 2022-08-31 DIAGNOSIS — N186 End stage renal disease: Secondary | ICD-10-CM | POA: Diagnosis not present

## 2022-08-31 DIAGNOSIS — D509 Iron deficiency anemia, unspecified: Secondary | ICD-10-CM | POA: Diagnosis not present

## 2022-08-31 DIAGNOSIS — Z992 Dependence on renal dialysis: Secondary | ICD-10-CM | POA: Diagnosis not present

## 2022-08-31 DIAGNOSIS — N2581 Secondary hyperparathyroidism of renal origin: Secondary | ICD-10-CM | POA: Diagnosis not present

## 2022-08-31 DIAGNOSIS — Z79899 Other long term (current) drug therapy: Secondary | ICD-10-CM | POA: Diagnosis not present

## 2022-08-31 DIAGNOSIS — D631 Anemia in chronic kidney disease: Secondary | ICD-10-CM | POA: Diagnosis not present

## 2022-09-02 DIAGNOSIS — D631 Anemia in chronic kidney disease: Secondary | ICD-10-CM | POA: Diagnosis not present

## 2022-09-02 DIAGNOSIS — Z79899 Other long term (current) drug therapy: Secondary | ICD-10-CM | POA: Diagnosis not present

## 2022-09-02 DIAGNOSIS — N2581 Secondary hyperparathyroidism of renal origin: Secondary | ICD-10-CM | POA: Diagnosis not present

## 2022-09-02 DIAGNOSIS — N186 End stage renal disease: Secondary | ICD-10-CM | POA: Diagnosis not present

## 2022-09-02 DIAGNOSIS — Z992 Dependence on renal dialysis: Secondary | ICD-10-CM | POA: Diagnosis not present

## 2022-09-02 DIAGNOSIS — D509 Iron deficiency anemia, unspecified: Secondary | ICD-10-CM | POA: Diagnosis not present

## 2022-09-03 DIAGNOSIS — N186 End stage renal disease: Secondary | ICD-10-CM | POA: Diagnosis not present

## 2022-09-03 DIAGNOSIS — E291 Testicular hypofunction: Secondary | ICD-10-CM | POA: Diagnosis not present

## 2022-09-03 DIAGNOSIS — D509 Iron deficiency anemia, unspecified: Secondary | ICD-10-CM | POA: Diagnosis not present

## 2022-09-03 DIAGNOSIS — R948 Abnormal results of function studies of other organs and systems: Secondary | ICD-10-CM | POA: Diagnosis not present

## 2022-09-03 DIAGNOSIS — Z79899 Other long term (current) drug therapy: Secondary | ICD-10-CM | POA: Diagnosis not present

## 2022-09-03 DIAGNOSIS — N2581 Secondary hyperparathyroidism of renal origin: Secondary | ICD-10-CM | POA: Diagnosis not present

## 2022-09-03 DIAGNOSIS — Z992 Dependence on renal dialysis: Secondary | ICD-10-CM | POA: Diagnosis not present

## 2022-09-03 DIAGNOSIS — D631 Anemia in chronic kidney disease: Secondary | ICD-10-CM | POA: Diagnosis not present

## 2022-09-05 DIAGNOSIS — D631 Anemia in chronic kidney disease: Secondary | ICD-10-CM | POA: Diagnosis not present

## 2022-09-05 DIAGNOSIS — N2581 Secondary hyperparathyroidism of renal origin: Secondary | ICD-10-CM | POA: Diagnosis not present

## 2022-09-05 DIAGNOSIS — Z79899 Other long term (current) drug therapy: Secondary | ICD-10-CM | POA: Diagnosis not present

## 2022-09-05 DIAGNOSIS — D509 Iron deficiency anemia, unspecified: Secondary | ICD-10-CM | POA: Diagnosis not present

## 2022-09-05 DIAGNOSIS — N186 End stage renal disease: Secondary | ICD-10-CM | POA: Diagnosis not present

## 2022-09-05 DIAGNOSIS — Z992 Dependence on renal dialysis: Secondary | ICD-10-CM | POA: Diagnosis not present

## 2022-09-06 DIAGNOSIS — N2581 Secondary hyperparathyroidism of renal origin: Secondary | ICD-10-CM | POA: Diagnosis not present

## 2022-09-06 DIAGNOSIS — Z992 Dependence on renal dialysis: Secondary | ICD-10-CM | POA: Diagnosis not present

## 2022-09-06 DIAGNOSIS — N186 End stage renal disease: Secondary | ICD-10-CM | POA: Diagnosis not present

## 2022-09-06 DIAGNOSIS — Z79899 Other long term (current) drug therapy: Secondary | ICD-10-CM | POA: Diagnosis not present

## 2022-09-06 DIAGNOSIS — D631 Anemia in chronic kidney disease: Secondary | ICD-10-CM | POA: Diagnosis not present

## 2022-09-06 DIAGNOSIS — D509 Iron deficiency anemia, unspecified: Secondary | ICD-10-CM | POA: Diagnosis not present

## 2022-09-10 DIAGNOSIS — D509 Iron deficiency anemia, unspecified: Secondary | ICD-10-CM | POA: Diagnosis not present

## 2022-09-10 DIAGNOSIS — N2581 Secondary hyperparathyroidism of renal origin: Secondary | ICD-10-CM | POA: Diagnosis not present

## 2022-09-10 DIAGNOSIS — N5201 Erectile dysfunction due to arterial insufficiency: Secondary | ICD-10-CM | POA: Diagnosis not present

## 2022-09-10 DIAGNOSIS — E291 Testicular hypofunction: Secondary | ICD-10-CM | POA: Diagnosis not present

## 2022-09-10 DIAGNOSIS — Z79899 Other long term (current) drug therapy: Secondary | ICD-10-CM | POA: Diagnosis not present

## 2022-09-10 DIAGNOSIS — D631 Anemia in chronic kidney disease: Secondary | ICD-10-CM | POA: Diagnosis not present

## 2022-09-10 DIAGNOSIS — N186 End stage renal disease: Secondary | ICD-10-CM | POA: Diagnosis not present

## 2022-09-10 DIAGNOSIS — Z992 Dependence on renal dialysis: Secondary | ICD-10-CM | POA: Diagnosis not present

## 2022-09-11 DIAGNOSIS — N186 End stage renal disease: Secondary | ICD-10-CM | POA: Diagnosis not present

## 2022-09-11 DIAGNOSIS — N2581 Secondary hyperparathyroidism of renal origin: Secondary | ICD-10-CM | POA: Diagnosis not present

## 2022-09-11 DIAGNOSIS — D631 Anemia in chronic kidney disease: Secondary | ICD-10-CM | POA: Diagnosis not present

## 2022-09-11 DIAGNOSIS — Z79899 Other long term (current) drug therapy: Secondary | ICD-10-CM | POA: Diagnosis not present

## 2022-09-11 DIAGNOSIS — Z992 Dependence on renal dialysis: Secondary | ICD-10-CM | POA: Diagnosis not present

## 2022-09-11 DIAGNOSIS — D509 Iron deficiency anemia, unspecified: Secondary | ICD-10-CM | POA: Diagnosis not present

## 2022-09-13 DIAGNOSIS — D509 Iron deficiency anemia, unspecified: Secondary | ICD-10-CM | POA: Diagnosis not present

## 2022-09-13 DIAGNOSIS — Z992 Dependence on renal dialysis: Secondary | ICD-10-CM | POA: Diagnosis not present

## 2022-09-13 DIAGNOSIS — N2581 Secondary hyperparathyroidism of renal origin: Secondary | ICD-10-CM | POA: Diagnosis not present

## 2022-09-13 DIAGNOSIS — D631 Anemia in chronic kidney disease: Secondary | ICD-10-CM | POA: Diagnosis not present

## 2022-09-13 DIAGNOSIS — N186 End stage renal disease: Secondary | ICD-10-CM | POA: Diagnosis not present

## 2022-09-13 DIAGNOSIS — Z79899 Other long term (current) drug therapy: Secondary | ICD-10-CM | POA: Diagnosis not present

## 2022-09-14 DIAGNOSIS — D631 Anemia in chronic kidney disease: Secondary | ICD-10-CM | POA: Diagnosis not present

## 2022-09-14 DIAGNOSIS — D509 Iron deficiency anemia, unspecified: Secondary | ICD-10-CM | POA: Diagnosis not present

## 2022-09-14 DIAGNOSIS — N2589 Other disorders resulting from impaired renal tubular function: Secondary | ICD-10-CM | POA: Diagnosis not present

## 2022-09-14 DIAGNOSIS — N2581 Secondary hyperparathyroidism of renal origin: Secondary | ICD-10-CM | POA: Diagnosis not present

## 2022-09-14 DIAGNOSIS — I120 Hypertensive chronic kidney disease with stage 5 chronic kidney disease or end stage renal disease: Secondary | ICD-10-CM | POA: Diagnosis not present

## 2022-09-14 DIAGNOSIS — N186 End stage renal disease: Secondary | ICD-10-CM | POA: Diagnosis not present

## 2022-09-14 DIAGNOSIS — Z23 Encounter for immunization: Secondary | ICD-10-CM | POA: Diagnosis not present

## 2022-09-14 DIAGNOSIS — Z992 Dependence on renal dialysis: Secondary | ICD-10-CM | POA: Diagnosis not present

## 2022-09-14 DIAGNOSIS — N028 Recurrent and persistent hematuria with other morphologic changes: Secondary | ICD-10-CM | POA: Diagnosis not present

## 2022-09-16 DIAGNOSIS — N2581 Secondary hyperparathyroidism of renal origin: Secondary | ICD-10-CM | POA: Diagnosis not present

## 2022-09-16 DIAGNOSIS — D631 Anemia in chronic kidney disease: Secondary | ICD-10-CM | POA: Diagnosis not present

## 2022-09-16 DIAGNOSIS — Z992 Dependence on renal dialysis: Secondary | ICD-10-CM | POA: Diagnosis not present

## 2022-09-16 DIAGNOSIS — N2589 Other disorders resulting from impaired renal tubular function: Secondary | ICD-10-CM | POA: Diagnosis not present

## 2022-09-16 DIAGNOSIS — D509 Iron deficiency anemia, unspecified: Secondary | ICD-10-CM | POA: Diagnosis not present

## 2022-09-16 DIAGNOSIS — N186 End stage renal disease: Secondary | ICD-10-CM | POA: Diagnosis not present

## 2022-09-17 ENCOUNTER — Telehealth (HOSPITAL_COMMUNITY): Payer: Self-pay | Admitting: *Deleted

## 2022-09-17 DIAGNOSIS — D631 Anemia in chronic kidney disease: Secondary | ICD-10-CM | POA: Diagnosis not present

## 2022-09-17 DIAGNOSIS — N2581 Secondary hyperparathyroidism of renal origin: Secondary | ICD-10-CM | POA: Diagnosis not present

## 2022-09-17 DIAGNOSIS — D509 Iron deficiency anemia, unspecified: Secondary | ICD-10-CM | POA: Diagnosis not present

## 2022-09-17 DIAGNOSIS — N186 End stage renal disease: Secondary | ICD-10-CM | POA: Diagnosis not present

## 2022-09-17 DIAGNOSIS — Z992 Dependence on renal dialysis: Secondary | ICD-10-CM | POA: Diagnosis not present

## 2022-09-17 DIAGNOSIS — N2589 Other disorders resulting from impaired renal tubular function: Secondary | ICD-10-CM | POA: Diagnosis not present

## 2022-09-17 NOTE — Telephone Encounter (Signed)
Patient has noticed uptick in afib burden over the last week. Episodes only last 30 mins-1 hour and do not correlate with certain activities. HR in the 120s with the episodes. Discussed with Adline Peals PA will continue watchful waiting if episodes continue to increase in frequency/duration will short-term reload amiodarone. Pt will call with update.

## 2022-09-19 DIAGNOSIS — D631 Anemia in chronic kidney disease: Secondary | ICD-10-CM | POA: Diagnosis not present

## 2022-09-19 DIAGNOSIS — Z7682 Awaiting organ transplant status: Secondary | ICD-10-CM | POA: Diagnosis not present

## 2022-09-19 DIAGNOSIS — N186 End stage renal disease: Secondary | ICD-10-CM | POA: Diagnosis not present

## 2022-09-19 DIAGNOSIS — D509 Iron deficiency anemia, unspecified: Secondary | ICD-10-CM | POA: Diagnosis not present

## 2022-09-19 DIAGNOSIS — N2589 Other disorders resulting from impaired renal tubular function: Secondary | ICD-10-CM | POA: Diagnosis not present

## 2022-09-19 DIAGNOSIS — Z992 Dependence on renal dialysis: Secondary | ICD-10-CM | POA: Diagnosis not present

## 2022-09-19 DIAGNOSIS — N2581 Secondary hyperparathyroidism of renal origin: Secondary | ICD-10-CM | POA: Diagnosis not present

## 2022-09-20 DIAGNOSIS — D509 Iron deficiency anemia, unspecified: Secondary | ICD-10-CM | POA: Diagnosis not present

## 2022-09-20 DIAGNOSIS — N2589 Other disorders resulting from impaired renal tubular function: Secondary | ICD-10-CM | POA: Diagnosis not present

## 2022-09-20 DIAGNOSIS — N186 End stage renal disease: Secondary | ICD-10-CM | POA: Diagnosis not present

## 2022-09-20 DIAGNOSIS — N2581 Secondary hyperparathyroidism of renal origin: Secondary | ICD-10-CM | POA: Diagnosis not present

## 2022-09-20 DIAGNOSIS — D631 Anemia in chronic kidney disease: Secondary | ICD-10-CM | POA: Diagnosis not present

## 2022-09-20 DIAGNOSIS — Z992 Dependence on renal dialysis: Secondary | ICD-10-CM | POA: Diagnosis not present

## 2022-09-23 DIAGNOSIS — N186 End stage renal disease: Secondary | ICD-10-CM | POA: Diagnosis not present

## 2022-09-23 DIAGNOSIS — D631 Anemia in chronic kidney disease: Secondary | ICD-10-CM | POA: Diagnosis not present

## 2022-09-23 DIAGNOSIS — N2581 Secondary hyperparathyroidism of renal origin: Secondary | ICD-10-CM | POA: Diagnosis not present

## 2022-09-23 DIAGNOSIS — Z992 Dependence on renal dialysis: Secondary | ICD-10-CM | POA: Diagnosis not present

## 2022-09-23 DIAGNOSIS — D509 Iron deficiency anemia, unspecified: Secondary | ICD-10-CM | POA: Diagnosis not present

## 2022-09-23 DIAGNOSIS — N2589 Other disorders resulting from impaired renal tubular function: Secondary | ICD-10-CM | POA: Diagnosis not present

## 2022-09-24 DIAGNOSIS — D631 Anemia in chronic kidney disease: Secondary | ICD-10-CM | POA: Diagnosis not present

## 2022-09-24 DIAGNOSIS — N2589 Other disorders resulting from impaired renal tubular function: Secondary | ICD-10-CM | POA: Diagnosis not present

## 2022-09-24 DIAGNOSIS — Z992 Dependence on renal dialysis: Secondary | ICD-10-CM | POA: Diagnosis not present

## 2022-09-24 DIAGNOSIS — N2581 Secondary hyperparathyroidism of renal origin: Secondary | ICD-10-CM | POA: Diagnosis not present

## 2022-09-24 DIAGNOSIS — N186 End stage renal disease: Secondary | ICD-10-CM | POA: Diagnosis not present

## 2022-09-24 DIAGNOSIS — D509 Iron deficiency anemia, unspecified: Secondary | ICD-10-CM | POA: Diagnosis not present

## 2022-09-27 DIAGNOSIS — Z992 Dependence on renal dialysis: Secondary | ICD-10-CM | POA: Diagnosis not present

## 2022-09-27 DIAGNOSIS — N186 End stage renal disease: Secondary | ICD-10-CM | POA: Diagnosis not present

## 2022-09-27 DIAGNOSIS — D509 Iron deficiency anemia, unspecified: Secondary | ICD-10-CM | POA: Diagnosis not present

## 2022-09-27 DIAGNOSIS — N2589 Other disorders resulting from impaired renal tubular function: Secondary | ICD-10-CM | POA: Diagnosis not present

## 2022-09-27 DIAGNOSIS — D631 Anemia in chronic kidney disease: Secondary | ICD-10-CM | POA: Diagnosis not present

## 2022-09-27 DIAGNOSIS — N2581 Secondary hyperparathyroidism of renal origin: Secondary | ICD-10-CM | POA: Diagnosis not present

## 2022-09-28 ENCOUNTER — Other Ambulatory Visit (HOSPITAL_COMMUNITY): Payer: Medicare Other

## 2022-09-28 DIAGNOSIS — D509 Iron deficiency anemia, unspecified: Secondary | ICD-10-CM | POA: Diagnosis not present

## 2022-09-28 DIAGNOSIS — N2589 Other disorders resulting from impaired renal tubular function: Secondary | ICD-10-CM | POA: Diagnosis not present

## 2022-09-28 DIAGNOSIS — N2581 Secondary hyperparathyroidism of renal origin: Secondary | ICD-10-CM | POA: Diagnosis not present

## 2022-09-28 DIAGNOSIS — D631 Anemia in chronic kidney disease: Secondary | ICD-10-CM | POA: Diagnosis not present

## 2022-09-28 DIAGNOSIS — Z992 Dependence on renal dialysis: Secondary | ICD-10-CM | POA: Diagnosis not present

## 2022-09-28 DIAGNOSIS — N186 End stage renal disease: Secondary | ICD-10-CM | POA: Diagnosis not present

## 2022-09-30 DIAGNOSIS — N2581 Secondary hyperparathyroidism of renal origin: Secondary | ICD-10-CM | POA: Diagnosis not present

## 2022-09-30 DIAGNOSIS — N186 End stage renal disease: Secondary | ICD-10-CM | POA: Diagnosis not present

## 2022-09-30 DIAGNOSIS — N2589 Other disorders resulting from impaired renal tubular function: Secondary | ICD-10-CM | POA: Diagnosis not present

## 2022-09-30 DIAGNOSIS — D509 Iron deficiency anemia, unspecified: Secondary | ICD-10-CM | POA: Diagnosis not present

## 2022-09-30 DIAGNOSIS — Z992 Dependence on renal dialysis: Secondary | ICD-10-CM | POA: Diagnosis not present

## 2022-09-30 DIAGNOSIS — D631 Anemia in chronic kidney disease: Secondary | ICD-10-CM | POA: Diagnosis not present

## 2022-10-01 DIAGNOSIS — D509 Iron deficiency anemia, unspecified: Secondary | ICD-10-CM | POA: Diagnosis not present

## 2022-10-01 DIAGNOSIS — N2581 Secondary hyperparathyroidism of renal origin: Secondary | ICD-10-CM | POA: Diagnosis not present

## 2022-10-01 DIAGNOSIS — Z992 Dependence on renal dialysis: Secondary | ICD-10-CM | POA: Diagnosis not present

## 2022-10-01 DIAGNOSIS — N2589 Other disorders resulting from impaired renal tubular function: Secondary | ICD-10-CM | POA: Diagnosis not present

## 2022-10-01 DIAGNOSIS — M1040 Other secondary gout, unspecified site: Secondary | ICD-10-CM | POA: Diagnosis not present

## 2022-10-01 DIAGNOSIS — N186 End stage renal disease: Secondary | ICD-10-CM | POA: Diagnosis not present

## 2022-10-01 DIAGNOSIS — D631 Anemia in chronic kidney disease: Secondary | ICD-10-CM | POA: Diagnosis not present

## 2022-10-03 DIAGNOSIS — N186 End stage renal disease: Secondary | ICD-10-CM | POA: Diagnosis not present

## 2022-10-03 DIAGNOSIS — D509 Iron deficiency anemia, unspecified: Secondary | ICD-10-CM | POA: Diagnosis not present

## 2022-10-03 DIAGNOSIS — D631 Anemia in chronic kidney disease: Secondary | ICD-10-CM | POA: Diagnosis not present

## 2022-10-03 DIAGNOSIS — N2589 Other disorders resulting from impaired renal tubular function: Secondary | ICD-10-CM | POA: Diagnosis not present

## 2022-10-03 DIAGNOSIS — N2581 Secondary hyperparathyroidism of renal origin: Secondary | ICD-10-CM | POA: Diagnosis not present

## 2022-10-03 DIAGNOSIS — Z992 Dependence on renal dialysis: Secondary | ICD-10-CM | POA: Diagnosis not present

## 2022-10-05 DIAGNOSIS — D631 Anemia in chronic kidney disease: Secondary | ICD-10-CM | POA: Diagnosis not present

## 2022-10-05 DIAGNOSIS — N186 End stage renal disease: Secondary | ICD-10-CM | POA: Diagnosis not present

## 2022-10-05 DIAGNOSIS — D509 Iron deficiency anemia, unspecified: Secondary | ICD-10-CM | POA: Diagnosis not present

## 2022-10-05 DIAGNOSIS — Z992 Dependence on renal dialysis: Secondary | ICD-10-CM | POA: Diagnosis not present

## 2022-10-05 DIAGNOSIS — N2581 Secondary hyperparathyroidism of renal origin: Secondary | ICD-10-CM | POA: Diagnosis not present

## 2022-10-05 DIAGNOSIS — N2589 Other disorders resulting from impaired renal tubular function: Secondary | ICD-10-CM | POA: Diagnosis not present

## 2022-10-07 DIAGNOSIS — Z992 Dependence on renal dialysis: Secondary | ICD-10-CM | POA: Diagnosis not present

## 2022-10-07 DIAGNOSIS — D509 Iron deficiency anemia, unspecified: Secondary | ICD-10-CM | POA: Diagnosis not present

## 2022-10-07 DIAGNOSIS — N186 End stage renal disease: Secondary | ICD-10-CM | POA: Diagnosis not present

## 2022-10-07 DIAGNOSIS — D631 Anemia in chronic kidney disease: Secondary | ICD-10-CM | POA: Diagnosis not present

## 2022-10-07 DIAGNOSIS — N2581 Secondary hyperparathyroidism of renal origin: Secondary | ICD-10-CM | POA: Diagnosis not present

## 2022-10-07 DIAGNOSIS — N2589 Other disorders resulting from impaired renal tubular function: Secondary | ICD-10-CM | POA: Diagnosis not present

## 2022-10-09 DIAGNOSIS — D509 Iron deficiency anemia, unspecified: Secondary | ICD-10-CM | POA: Diagnosis not present

## 2022-10-09 DIAGNOSIS — Z992 Dependence on renal dialysis: Secondary | ICD-10-CM | POA: Diagnosis not present

## 2022-10-09 DIAGNOSIS — N186 End stage renal disease: Secondary | ICD-10-CM | POA: Diagnosis not present

## 2022-10-09 DIAGNOSIS — D631 Anemia in chronic kidney disease: Secondary | ICD-10-CM | POA: Diagnosis not present

## 2022-10-09 DIAGNOSIS — N2581 Secondary hyperparathyroidism of renal origin: Secondary | ICD-10-CM | POA: Diagnosis not present

## 2022-10-09 DIAGNOSIS — N2589 Other disorders resulting from impaired renal tubular function: Secondary | ICD-10-CM | POA: Diagnosis not present

## 2022-10-11 DIAGNOSIS — Z992 Dependence on renal dialysis: Secondary | ICD-10-CM | POA: Diagnosis not present

## 2022-10-11 DIAGNOSIS — D509 Iron deficiency anemia, unspecified: Secondary | ICD-10-CM | POA: Diagnosis not present

## 2022-10-11 DIAGNOSIS — N186 End stage renal disease: Secondary | ICD-10-CM | POA: Diagnosis not present

## 2022-10-11 DIAGNOSIS — N2581 Secondary hyperparathyroidism of renal origin: Secondary | ICD-10-CM | POA: Diagnosis not present

## 2022-10-11 DIAGNOSIS — D631 Anemia in chronic kidney disease: Secondary | ICD-10-CM | POA: Diagnosis not present

## 2022-10-11 DIAGNOSIS — N2589 Other disorders resulting from impaired renal tubular function: Secondary | ICD-10-CM | POA: Diagnosis not present

## 2022-10-12 DIAGNOSIS — Z992 Dependence on renal dialysis: Secondary | ICD-10-CM | POA: Diagnosis not present

## 2022-10-12 DIAGNOSIS — N186 End stage renal disease: Secondary | ICD-10-CM | POA: Diagnosis not present

## 2022-10-12 DIAGNOSIS — D509 Iron deficiency anemia, unspecified: Secondary | ICD-10-CM | POA: Diagnosis not present

## 2022-10-12 DIAGNOSIS — N2581 Secondary hyperparathyroidism of renal origin: Secondary | ICD-10-CM | POA: Diagnosis not present

## 2022-10-12 DIAGNOSIS — N2589 Other disorders resulting from impaired renal tubular function: Secondary | ICD-10-CM | POA: Diagnosis not present

## 2022-10-12 DIAGNOSIS — D631 Anemia in chronic kidney disease: Secondary | ICD-10-CM | POA: Diagnosis not present

## 2022-10-15 DIAGNOSIS — Z992 Dependence on renal dialysis: Secondary | ICD-10-CM | POA: Diagnosis not present

## 2022-10-15 DIAGNOSIS — N186 End stage renal disease: Secondary | ICD-10-CM | POA: Diagnosis not present

## 2022-10-15 DIAGNOSIS — N028 Recurrent and persistent hematuria with other morphologic changes: Secondary | ICD-10-CM | POA: Diagnosis not present

## 2022-10-16 DIAGNOSIS — D631 Anemia in chronic kidney disease: Secondary | ICD-10-CM | POA: Diagnosis not present

## 2022-10-16 DIAGNOSIS — Z79899 Other long term (current) drug therapy: Secondary | ICD-10-CM | POA: Diagnosis not present

## 2022-10-16 DIAGNOSIS — N2581 Secondary hyperparathyroidism of renal origin: Secondary | ICD-10-CM | POA: Diagnosis not present

## 2022-10-16 DIAGNOSIS — N186 End stage renal disease: Secondary | ICD-10-CM | POA: Diagnosis not present

## 2022-10-16 DIAGNOSIS — Z992 Dependence on renal dialysis: Secondary | ICD-10-CM | POA: Diagnosis not present

## 2022-10-16 DIAGNOSIS — E440 Moderate protein-calorie malnutrition: Secondary | ICD-10-CM | POA: Diagnosis not present

## 2022-10-16 DIAGNOSIS — D509 Iron deficiency anemia, unspecified: Secondary | ICD-10-CM | POA: Diagnosis not present

## 2022-10-18 DIAGNOSIS — D631 Anemia in chronic kidney disease: Secondary | ICD-10-CM | POA: Diagnosis not present

## 2022-10-18 DIAGNOSIS — N186 End stage renal disease: Secondary | ICD-10-CM | POA: Diagnosis not present

## 2022-10-18 DIAGNOSIS — N2581 Secondary hyperparathyroidism of renal origin: Secondary | ICD-10-CM | POA: Diagnosis not present

## 2022-10-18 DIAGNOSIS — D509 Iron deficiency anemia, unspecified: Secondary | ICD-10-CM | POA: Diagnosis not present

## 2022-10-18 DIAGNOSIS — E440 Moderate protein-calorie malnutrition: Secondary | ICD-10-CM | POA: Diagnosis not present

## 2022-10-18 DIAGNOSIS — Z992 Dependence on renal dialysis: Secondary | ICD-10-CM | POA: Diagnosis not present

## 2022-10-18 DIAGNOSIS — M1040 Other secondary gout, unspecified site: Secondary | ICD-10-CM | POA: Diagnosis not present

## 2022-10-20 DIAGNOSIS — E440 Moderate protein-calorie malnutrition: Secondary | ICD-10-CM | POA: Diagnosis not present

## 2022-10-20 DIAGNOSIS — N186 End stage renal disease: Secondary | ICD-10-CM | POA: Diagnosis not present

## 2022-10-20 DIAGNOSIS — D631 Anemia in chronic kidney disease: Secondary | ICD-10-CM | POA: Diagnosis not present

## 2022-10-20 DIAGNOSIS — N2581 Secondary hyperparathyroidism of renal origin: Secondary | ICD-10-CM | POA: Diagnosis not present

## 2022-10-20 DIAGNOSIS — D509 Iron deficiency anemia, unspecified: Secondary | ICD-10-CM | POA: Diagnosis not present

## 2022-10-20 DIAGNOSIS — Z992 Dependence on renal dialysis: Secondary | ICD-10-CM | POA: Diagnosis not present

## 2022-10-23 ENCOUNTER — Ambulatory Visit: Payer: 59 | Admitting: Pulmonary Disease

## 2022-10-23 DIAGNOSIS — Z992 Dependence on renal dialysis: Secondary | ICD-10-CM | POA: Diagnosis not present

## 2022-10-23 DIAGNOSIS — N2581 Secondary hyperparathyroidism of renal origin: Secondary | ICD-10-CM | POA: Diagnosis not present

## 2022-10-23 DIAGNOSIS — D631 Anemia in chronic kidney disease: Secondary | ICD-10-CM | POA: Diagnosis not present

## 2022-10-23 DIAGNOSIS — D509 Iron deficiency anemia, unspecified: Secondary | ICD-10-CM | POA: Diagnosis not present

## 2022-10-23 DIAGNOSIS — E440 Moderate protein-calorie malnutrition: Secondary | ICD-10-CM | POA: Diagnosis not present

## 2022-10-23 DIAGNOSIS — N186 End stage renal disease: Secondary | ICD-10-CM | POA: Diagnosis not present

## 2022-10-25 ENCOUNTER — Ambulatory Visit (INDEPENDENT_AMBULATORY_CARE_PROVIDER_SITE_OTHER): Payer: Medicare Other | Admitting: Nurse Practitioner

## 2022-10-25 ENCOUNTER — Ambulatory Visit (INDEPENDENT_AMBULATORY_CARE_PROVIDER_SITE_OTHER)
Admission: RE | Admit: 2022-10-25 | Discharge: 2022-10-25 | Disposition: A | Discharge status: Home / Self Care | Payer: Medicare Other | Source: Ambulatory Visit | Attending: Nurse Practitioner | Admitting: Nurse Practitioner

## 2022-10-25 VITALS — BP 132/88 | HR 75 | Ht 77.0 in | Wt 341.0 lb

## 2022-10-25 DIAGNOSIS — Z992 Dependence on renal dialysis: Secondary | ICD-10-CM | POA: Diagnosis not present

## 2022-10-25 DIAGNOSIS — R6881 Early satiety: Secondary | ICD-10-CM

## 2022-10-25 DIAGNOSIS — D509 Iron deficiency anemia, unspecified: Secondary | ICD-10-CM | POA: Diagnosis not present

## 2022-10-25 DIAGNOSIS — M160 Bilateral primary osteoarthritis of hip: Secondary | ICD-10-CM | POA: Diagnosis not present

## 2022-10-25 DIAGNOSIS — D631 Anemia in chronic kidney disease: Secondary | ICD-10-CM | POA: Diagnosis not present

## 2022-10-25 DIAGNOSIS — E440 Moderate protein-calorie malnutrition: Secondary | ICD-10-CM | POA: Diagnosis not present

## 2022-10-25 DIAGNOSIS — N186 End stage renal disease: Secondary | ICD-10-CM | POA: Diagnosis not present

## 2022-10-25 DIAGNOSIS — N2581 Secondary hyperparathyroidism of renal origin: Secondary | ICD-10-CM | POA: Diagnosis not present

## 2022-10-25 MED ORDER — PANTOPRAZOLE SODIUM 20 MG PO TBEC: 20.0000 mg | DELAYED_RELEASE_TABLET | Freq: Every day | ORAL | 0 refills | Status: DC

## 2022-10-25 NOTE — Progress Notes (Signed)
Acute Office Visit  Subjective:     Patient ID: Eric Douglas, male    DOB: 1971/08/05, 52 y.o.   MRN: [id number]  Chief Complaint  Patient presents with   [place] as much, feels full faster, denies excessive gas/belching/flatulence; denies pain    HPI Patient is in today for stomach issues with history of Afib, ESRD on dialysis.  Currently doing hemodialysis in center on Tuesday Thursday Saturday.   States that he can eat very little and feel full. States that he will then go to dialysis and complete his treatment and feel like he is "starving".  Patient is not having any heartburn symptomology per his report.  No increased gas.  State that he did have a BM this am. That was normal for him. States that he goes to the bathroom every day and sometimes more than once. States that it is formed just a small amount. States that he has been taking fibercon that has  No more gas than normal for patient report   Review of Systems  Constitutional:  Negative for chills and fever.  Respiratory:  Negative for shortness of breath.   Cardiovascular:  Positive for palpitations (with a fib). Negative for chest pain.  Gastrointestinal:  Negative for abdominal pain, constipation, diarrhea, nausea and vomiting.  Genitourinary:        Anuria         Objective:    BP 132/88   Pulse 75   Ht '6\' 5"'$  (1.956 m)   Wt (!) 341 lb (154.7 kg)   SpO2 96%   BMI 40.44 kg/m  BP Readings from Last 3 Encounters:  10/25/22 132/88  08/08/22 134/80  04/05/22 130/80   Wt Readings from Last 3 Encounters:  10/25/22 (!) 341 lb (154.7 kg)  08/08/22 (!) 341 lb 6.4 oz (154.9 kg)  08/08/22 (!) 339 lb (153.8 kg)      Physical Exam Vitals and nursing note reviewed.  Constitutional:      Appearance: Normal appearance. He is obese.  Cardiovascular:     Rate and Rhythm: Normal rate and regular rhythm.     Heart sounds: Normal heart sounds.  Pulmonary:     Breath sounds: Normal  breath sounds.  Abdominal:     General: There is no distension.     Palpations: There is no mass.     Tenderness: There is no abdominal tenderness.     Hernia: No hernia is present.     Comments: Hypoactive bowel sounds  Neurological:     Mental Status: He is alert.     No results found for any visits on 10/25/22.      Assessment & Plan:   Problem List Items Addressed This Visit       Other   Early satiety - Primary    Ambiguous in nature.  Will try patient on a PPI.  Trial for 1 month if beneficial can titrate down and off if symptom recurs we will put him on it for several months.  Also obtain abdomen 1 view to rule out constipation      Relevant Medications   pantoprazole (PROTONIX) 20 MG tablet   Other Relevant Orders   DG Abd 1 View    Meds ordered this encounter  Medications   pantoprazole (PROTONIX) 20 MG tablet    Sig: Take 1 tablet (20 mg total) by mouth daily.    Dispense:  30 tablet    Refill:  0  Order Specific Question:   Supervising Provider    Answer:   Loura Pardon A [[id number]]    Return in about 2 months (around 12/24/2022) for CPE and Labs.  Romilda Garret, NP

## 2022-10-25 NOTE — Patient Instructions (Addendum)
Nice to see you today I will be in touch with the xray once I have the results I have sent in a medication to try for a month Over the next 2 months I would like to see you for your annual exam

## 2022-10-25 NOTE — Assessment & Plan Note (Signed)
Ambiguous in nature.  Will try patient on a PPI.  Trial for 1 month if beneficial can titrate down and off if symptom recurs we will put him on it for several months.  Also obtain abdomen 1 view to rule out constipation

## 2022-10-27 DIAGNOSIS — D631 Anemia in chronic kidney disease: Secondary | ICD-10-CM | POA: Diagnosis not present

## 2022-10-27 DIAGNOSIS — Z992 Dependence on renal dialysis: Secondary | ICD-10-CM | POA: Diagnosis not present

## 2022-10-27 DIAGNOSIS — E440 Moderate protein-calorie malnutrition: Secondary | ICD-10-CM | POA: Diagnosis not present

## 2022-10-27 DIAGNOSIS — N2581 Secondary hyperparathyroidism of renal origin: Secondary | ICD-10-CM | POA: Diagnosis not present

## 2022-10-27 DIAGNOSIS — D509 Iron deficiency anemia, unspecified: Secondary | ICD-10-CM | POA: Diagnosis not present

## 2022-10-27 DIAGNOSIS — N186 End stage renal disease: Secondary | ICD-10-CM | POA: Diagnosis not present

## 2022-10-30 DIAGNOSIS — Z992 Dependence on renal dialysis: Secondary | ICD-10-CM | POA: Diagnosis not present

## 2022-10-30 DIAGNOSIS — E440 Moderate protein-calorie malnutrition: Secondary | ICD-10-CM | POA: Diagnosis not present

## 2022-10-30 DIAGNOSIS — N186 End stage renal disease: Secondary | ICD-10-CM | POA: Diagnosis not present

## 2022-10-30 DIAGNOSIS — D631 Anemia in chronic kidney disease: Secondary | ICD-10-CM | POA: Diagnosis not present

## 2022-10-30 DIAGNOSIS — D509 Iron deficiency anemia, unspecified: Secondary | ICD-10-CM | POA: Diagnosis not present

## 2022-10-30 DIAGNOSIS — N2581 Secondary hyperparathyroidism of renal origin: Secondary | ICD-10-CM | POA: Diagnosis not present

## 2022-11-01 DIAGNOSIS — E440 Moderate protein-calorie malnutrition: Secondary | ICD-10-CM | POA: Diagnosis not present

## 2022-11-01 DIAGNOSIS — D509 Iron deficiency anemia, unspecified: Secondary | ICD-10-CM | POA: Diagnosis not present

## 2022-11-01 DIAGNOSIS — D631 Anemia in chronic kidney disease: Secondary | ICD-10-CM | POA: Diagnosis not present

## 2022-11-01 DIAGNOSIS — Z992 Dependence on renal dialysis: Secondary | ICD-10-CM | POA: Diagnosis not present

## 2022-11-01 DIAGNOSIS — N2581 Secondary hyperparathyroidism of renal origin: Secondary | ICD-10-CM | POA: Diagnosis not present

## 2022-11-01 DIAGNOSIS — N186 End stage renal disease: Secondary | ICD-10-CM | POA: Diagnosis not present

## 2022-11-04 DIAGNOSIS — D509 Iron deficiency anemia, unspecified: Secondary | ICD-10-CM | POA: Diagnosis not present

## 2022-11-04 DIAGNOSIS — Z992 Dependence on renal dialysis: Secondary | ICD-10-CM | POA: Diagnosis not present

## 2022-11-04 DIAGNOSIS — N186 End stage renal disease: Secondary | ICD-10-CM | POA: Diagnosis not present

## 2022-11-04 DIAGNOSIS — D631 Anemia in chronic kidney disease: Secondary | ICD-10-CM | POA: Diagnosis not present

## 2022-11-04 DIAGNOSIS — E440 Moderate protein-calorie malnutrition: Secondary | ICD-10-CM | POA: Diagnosis not present

## 2022-11-04 DIAGNOSIS — N2581 Secondary hyperparathyroidism of renal origin: Secondary | ICD-10-CM | POA: Diagnosis not present

## 2022-11-05 ENCOUNTER — Other Ambulatory Visit (HOSPITAL_COMMUNITY): Payer: Self-pay | Admitting: *Deleted

## 2022-11-05 DIAGNOSIS — D631 Anemia in chronic kidney disease: Secondary | ICD-10-CM | POA: Diagnosis not present

## 2022-11-05 DIAGNOSIS — D509 Iron deficiency anemia, unspecified: Secondary | ICD-10-CM | POA: Diagnosis not present

## 2022-11-05 DIAGNOSIS — N186 End stage renal disease: Secondary | ICD-10-CM | POA: Diagnosis not present

## 2022-11-05 DIAGNOSIS — N2581 Secondary hyperparathyroidism of renal origin: Secondary | ICD-10-CM | POA: Diagnosis not present

## 2022-11-05 DIAGNOSIS — Z992 Dependence on renal dialysis: Secondary | ICD-10-CM | POA: Diagnosis not present

## 2022-11-05 DIAGNOSIS — E440 Moderate protein-calorie malnutrition: Secondary | ICD-10-CM | POA: Diagnosis not present

## 2022-11-05 MED ORDER — APIXABAN 5 MG PO TABS: 5.0000 mg | ORAL_TABLET | Freq: Two times a day (BID) | ORAL | 1 refills | Status: DC

## 2022-11-07 DIAGNOSIS — Z992 Dependence on renal dialysis: Secondary | ICD-10-CM | POA: Diagnosis not present

## 2022-11-07 DIAGNOSIS — N186 End stage renal disease: Secondary | ICD-10-CM | POA: Diagnosis not present

## 2022-11-07 DIAGNOSIS — N2581 Secondary hyperparathyroidism of renal origin: Secondary | ICD-10-CM | POA: Diagnosis not present

## 2022-11-09 DIAGNOSIS — N2581 Secondary hyperparathyroidism of renal origin: Secondary | ICD-10-CM | POA: Diagnosis not present

## 2022-11-09 DIAGNOSIS — N186 End stage renal disease: Secondary | ICD-10-CM | POA: Diagnosis not present

## 2022-11-09 DIAGNOSIS — Z992 Dependence on renal dialysis: Secondary | ICD-10-CM | POA: Diagnosis not present

## 2022-11-12 DIAGNOSIS — N186 End stage renal disease: Secondary | ICD-10-CM | POA: Diagnosis not present

## 2022-11-12 DIAGNOSIS — E440 Moderate protein-calorie malnutrition: Secondary | ICD-10-CM | POA: Diagnosis not present

## 2022-11-12 DIAGNOSIS — D509 Iron deficiency anemia, unspecified: Secondary | ICD-10-CM | POA: Diagnosis not present

## 2022-11-12 DIAGNOSIS — Z992 Dependence on renal dialysis: Secondary | ICD-10-CM | POA: Diagnosis not present

## 2022-11-12 DIAGNOSIS — D631 Anemia in chronic kidney disease: Secondary | ICD-10-CM | POA: Diagnosis not present

## 2022-11-12 DIAGNOSIS — N2581 Secondary hyperparathyroidism of renal origin: Secondary | ICD-10-CM | POA: Diagnosis not present

## 2022-11-14 ENCOUNTER — Ambulatory Visit: Payer: 59 | Admitting: Pulmonary Disease

## 2022-11-14 DIAGNOSIS — D631 Anemia in chronic kidney disease: Secondary | ICD-10-CM | POA: Diagnosis not present

## 2022-11-14 DIAGNOSIS — N186 End stage renal disease: Secondary | ICD-10-CM | POA: Diagnosis not present

## 2022-11-14 DIAGNOSIS — E440 Moderate protein-calorie malnutrition: Secondary | ICD-10-CM | POA: Diagnosis not present

## 2022-11-14 DIAGNOSIS — D509 Iron deficiency anemia, unspecified: Secondary | ICD-10-CM | POA: Diagnosis not present

## 2022-11-14 DIAGNOSIS — N2581 Secondary hyperparathyroidism of renal origin: Secondary | ICD-10-CM | POA: Diagnosis not present

## 2022-11-14 DIAGNOSIS — Z992 Dependence on renal dialysis: Secondary | ICD-10-CM | POA: Diagnosis not present

## 2022-11-15 ENCOUNTER — Ambulatory Visit (INDEPENDENT_AMBULATORY_CARE_PROVIDER_SITE_OTHER): Payer: Medicare Other | Admitting: Pulmonary Disease

## 2022-11-15 ENCOUNTER — Encounter: Payer: Self-pay | Admitting: Pulmonary Disease

## 2022-11-15 VITALS — BP 130/82 | HR 60 | Ht 77.0 in | Wt 335.0 lb

## 2022-11-15 DIAGNOSIS — Z992 Dependence on renal dialysis: Secondary | ICD-10-CM | POA: Diagnosis not present

## 2022-11-15 DIAGNOSIS — N028 Recurrent and persistent hematuria with other morphologic changes: Secondary | ICD-10-CM | POA: Diagnosis not present

## 2022-11-15 DIAGNOSIS — G4733 Obstructive sleep apnea (adult) (pediatric): Secondary | ICD-10-CM

## 2022-11-15 DIAGNOSIS — N186 End stage renal disease: Secondary | ICD-10-CM | POA: Diagnosis not present

## 2022-11-15 NOTE — Progress Notes (Signed)
Eric Douglas    [id number]    08-09-1971  Primary Care Physician:Cable, Alyson Locket, NP  Referring Physician: Michela Pitcher, NP [place],  [place] [id number]  Chief complaint:   History of snoring, nonrestorative sleep Found to have severe obstructive sleep apnea  HPI:  Has been using CPAP regularly  Waking up feeling like he is at a good nights rest  No significant issues tolerating CPAP  He does use it every night  The rare occasion when he does not use CPAP, he does notice that his sleep is definitely different and he continues to benefit from CPAP use  He does use Lunesta 2 mg to help him with his sleep  He does have multiple awakenings and sometimes not able to fall back asleep Has had problems with insomnia for a while States he usually does not have significant difficulty falling asleep but staying asleep can be challenging   Patient with end-stage renal disease on hemodialysis, 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   amiodarone (PACERONE) 200 MG tablet Take 1 tablet (200 mg total) by mouth daily.   amLODipine (NORVASC) 2.5 MG tablet TAKE 1 TABLET BY MOUTH EVERYDAY AT BEDTIME   apixaban (ELIQUIS) 5 MG TABS tablet Take 1 tablet (5 mg total) by mouth 2 (two) times daily.   calcitRIOL (ROCALTROL) 0.25 MCG capsule Take 0.5 mcg by mouth every morning.   calcium acetate (PHOSLO) 667 MG tablet Take by mouth.   Calcium Polycarbophil (FIBERCON PO) Take 2 tablets by mouth every morning.   cinacalcet (SENSIPAR) 30 MG tablet Take 30 mg by mouth daily.   eszopiclone (LUNESTA) 2 MG TABS tablet  TAKE 1 TABLET (2 MG TOTAL) BY MOUTH IMMEDIATELY BEFORE BEDTIME AS NEEDED FOR SLEEP   gabapentin (NEURONTIN) 300 MG capsule Take 300 mg by mouth at bedtime.   lactulose (CHRONULAC) 10 GM/15ML solution Take by mouth.   Methoxy PEG-Epoetin Beta (MIRCERA IJ) Inject into the skin.   metoprolol tartrate (LOPRESSOR) 50 MG tablet Take 1 tablet (50 mg total) by mouth at bedtime. May take extra tablet for breakthrough afib   mupirocin ointment (BACTROBAN) 2 % Apply 1 application topically daily as needed (port care).   pantoprazole (PROTONIX) 20 MG tablet Take 1 tablet (20 mg total) by mouth daily.   sevelamer carbonate (RENVELA) 800 MG tablet TAKE 6 TABLETS BY MOUTH 3 TIMES A DAY WITH MEALS THEN 6 PILLS WITH MEALS TOTAL OF 18 PER DAY   sucroferric oxyhydroxide (VELPHORO) 500 MG chewable tablet Chew 500-1,000 mg by mouth See admin instructions. Chew 2 tablets (1000 mg) by mouth once or twice daily with meals, 500 mg with snacks   tamsulosin (FLOMAX) 0.4 MG CAPS capsule Take 0.4 mg by mouth daily.   testosterone cypionate (DEPOTESTOSTERONE CYPIONATE) 200 MG/ML injection SMARTSIG:0.5 Milliliter(s) IM Once a Week   No facility-administered encounter medications on file as of 11/15/2022.    Allergies as of 11/15/2022   (No Known Allergies)    Past Medical History:  Diagnosis Date   Fibula fracture    ORIF 1990  Gout    non crystal proven    History of chickenpox    History of kidney disease    HTN (hypertension)    Nephropathy    Ig A; dx 2002. micropscopic hematuria and proteinuria   Tibia fracture    ORIF 1990   Varicocele    L testes   Venous incompetence    R leg    Past Surgical History:  Procedure Laterality Date   APPENDECTOMY  1982   AV FISTULA PLACEMENT Left 06/27/2017   Procedure: LEFT ARM ARTERIOVENOUS (AV) FISTULA CREATION;  Surgeon: Waynetta Sandy, MD;  Location: [facility];  Service: Vascular;  Laterality: Left;   CARDIOVERSION N/A 12/21/2021   Procedure: CARDIOVERSION;   Surgeon: Sanda Klein, MD;  Location: [facility];  Service: Cardiovascular;  Laterality: N/A;   stab phlebectomy Left 12/17/2016   stab phlebectomy >20 incisions (left leg) by Tinnie Gens MD     Family History  Problem Relation Age of Onset   Cancer Paternal Grandfather 24       brain cancer   Hypertension Other        GP   Stroke Other    Diabetes Other        DM - GP    Heart disease Neg Hx     Social History   Socioeconomic History   Marital status: Divorced    Spouse name: Not on file   Number of children: 2   Years of education: Not on file   Highest education level: Not on file  Occupational History   Occupation: Service Tech    Comment: Charity fundraiser  Tobacco Use   Smoking status: Never   Smokeless tobacco: Never   Tobacco comments:    Never smoke 12/28/21  Vaping Use   Vaping Use: Never used  Substance and Sexual Activity   Alcohol use: No    Alcohol/week: 0.0 standard drinks of alcohol   Drug use: No   Sexual activity: Yes  Other Topics Concern   Not on file  Social History Narrative   Divorced, primary custody of son; Charity fundraiser; gets regular exercise.       Pt signed designated party release and gives no access to medical records. Can leave msg on cell [phone].    Social Determinants of Health   Financial Resource Strain: Low Risk  (08/08/2022)   Overall Financial Resource Strain (CARDIA)    Difficulty of Paying Living Expenses: Not hard at all  Food Insecurity: No Food Insecurity (08/08/2022)   Hunger Vital Sign    Worried About Running Out of Food in the Last Year: Never true    Ran Out of Food in the Last Year: Never true  Transportation Needs: No Transportation Needs (08/08/2022)   PRAPARE - Hydrologist (Medical): No    Lack of Transportation (Non-Medical): No  Physical Activity: Inactive (08/08/2022)   Exercise Vital Sign    Days of Exercise per Week: 0 days    Minutes of Exercise per  Session: 0 min  Stress: No Stress Concern Present (08/08/2022)   [place]    Feeling of Stress : Not at all  Social Connections: Moderately Isolated (08/08/2022)   Social Connection and Isolation Panel [NHANES]    Frequency of Communication with Friends and Family: More than three times a week    Frequency of Social Gatherings with Friends and Family: More than three times a week  Attends Religious Services: More than 4 times per year    Active Member of Clubs or Organizations: No    Attends Archivist Meetings: Never    Marital Status: Divorced  Human resources officer Violence: Not At Risk (08/08/2022)   Humiliation, Afraid, Rape, and Kick questionnaire    Fear of Current or Ex-Partner: No    Emotionally Abused: No    Physically Abused: No    Sexually Abused: No    Review of Systems  Constitutional:  Negative for fatigue.  Respiratory:  Positive for apnea. Negative for shortness of breath.   Psychiatric/Behavioral:  Positive for sleep disturbance.     Vitals:   11/15/22 1015  BP: 130/82  Pulse: 60  SpO2: 98%     Physical Exam Constitutional:      Appearance: He is obese.  HENT:     Head: Normocephalic.     Nose: Nose normal.     Mouth/Throat:     Comments: Mallampati 4, crowded oropharynx, macroglossia Eyes:     General: No scleral icterus. Cardiovascular:     Rate and Rhythm: Normal rate and regular rhythm.     Heart sounds: No murmur heard.    No friction rub.  Pulmonary:     Effort: No respiratory distress.     Breath sounds: No stridor. No wheezing or rhonchi.  Musculoskeletal:     Cervical back: No rigidity or tenderness.  Neurological:     Mental Status: He is alert.  Psychiatric:        Mood and Affect: Mood normal.       08/10/2021    3:00 PM  Results of the Epworth flowsheet  Sitting and reading 3  Watching TV 2  Sitting, inactive in a public place (e.g. a theatre or a  meeting) 3  As a passenger in a car for an hour without a break 2  Lying down to rest in the afternoon when circumstances permit 3  Sitting and talking to someone 1  Sitting quietly after a lunch without alcohol 3  In a car, while stopped for a few minutes in traffic 2  Total score 19    Data Reviewed: Echocardiogram 03/05/2017 shows pulmonary pressures of 34, normal EF  Home sleep study did reveal severe obstructive sleep apnea with an AHI of 62  Most recent compliance today 08/16/2022 through 11/13/2022 shows 99% compliance, average use of 7 hours 43 minutes CPAP of 15 Residual AHI 1.2  Assessment:  Severe obstructive sleep apnea -Compliant with CPAP use Benefiting from CPAP use  Obesity class III  Excessive daytime sleepiness is significantly improved  End-stage renal disease on hemodialysis  Insomnia-benefiting from Lunesta   Plan/Recommendations: Continue CPAP on a nightly basis  Continue Lunesta  I will see you back in 6 months  Call with significant concerns   Sherrilyn Rist MD [facility] 11/15/2022, 10:37 AM  CC: Michela Pitcher, NP

## 2022-11-15 NOTE — Patient Instructions (Signed)
Continue CPAP use nightly  Your download shows that your CPAP is working well  Continue weight loss efforts, graded exercise as tolerated  Call us with significant concerns  Continue Lunesta as needed

## 2022-11-16 ENCOUNTER — Other Ambulatory Visit: Payer: Self-pay | Admitting: Nurse Practitioner

## 2022-11-16 ENCOUNTER — Encounter: Payer: Self-pay | Admitting: Nurse Practitioner

## 2022-11-16 DIAGNOSIS — N186 End stage renal disease: Secondary | ICD-10-CM | POA: Diagnosis not present

## 2022-11-16 DIAGNOSIS — D631 Anemia in chronic kidney disease: Secondary | ICD-10-CM | POA: Diagnosis not present

## 2022-11-16 DIAGNOSIS — R6881 Early satiety: Secondary | ICD-10-CM

## 2022-11-16 DIAGNOSIS — N2581 Secondary hyperparathyroidism of renal origin: Secondary | ICD-10-CM | POA: Diagnosis not present

## 2022-11-16 DIAGNOSIS — Z992 Dependence on renal dialysis: Secondary | ICD-10-CM | POA: Diagnosis not present

## 2022-11-19 DIAGNOSIS — D631 Anemia in chronic kidney disease: Secondary | ICD-10-CM | POA: Diagnosis not present

## 2022-11-19 DIAGNOSIS — Z992 Dependence on renal dialysis: Secondary | ICD-10-CM | POA: Diagnosis not present

## 2022-11-19 DIAGNOSIS — N186 End stage renal disease: Secondary | ICD-10-CM | POA: Diagnosis not present

## 2022-11-19 DIAGNOSIS — N2581 Secondary hyperparathyroidism of renal origin: Secondary | ICD-10-CM | POA: Diagnosis not present

## 2022-11-21 DIAGNOSIS — D631 Anemia in chronic kidney disease: Secondary | ICD-10-CM | POA: Diagnosis not present

## 2022-11-21 DIAGNOSIS — N2581 Secondary hyperparathyroidism of renal origin: Secondary | ICD-10-CM | POA: Diagnosis not present

## 2022-11-21 DIAGNOSIS — Z992 Dependence on renal dialysis: Secondary | ICD-10-CM | POA: Diagnosis not present

## 2022-11-21 DIAGNOSIS — N186 End stage renal disease: Secondary | ICD-10-CM | POA: Diagnosis not present

## 2022-11-21 NOTE — Telephone Encounter (Signed)
Called pt and he said he was ok coming off of it. He stated that he thinks the fluid was coming from dialysis.

## 2022-11-23 ENCOUNTER — Encounter: Payer: Self-pay | Admitting: Nurse Practitioner

## 2022-11-23 ENCOUNTER — Ambulatory Visit (INDEPENDENT_AMBULATORY_CARE_PROVIDER_SITE_OTHER): Payer: Medicare Other | Admitting: Nurse Practitioner

## 2022-11-23 VITALS — BP 140/76 | HR 64 | Temp 98.2°F | Resp 16 | Ht 77.0 in | Wt 338.4 lb

## 2022-11-23 DIAGNOSIS — R42 Dizziness and giddiness: Secondary | ICD-10-CM | POA: Diagnosis not present

## 2022-11-23 DIAGNOSIS — N186 End stage renal disease: Secondary | ICD-10-CM | POA: Diagnosis not present

## 2022-11-23 DIAGNOSIS — D631 Anemia in chronic kidney disease: Secondary | ICD-10-CM | POA: Diagnosis not present

## 2022-11-23 DIAGNOSIS — N2581 Secondary hyperparathyroidism of renal origin: Secondary | ICD-10-CM | POA: Diagnosis not present

## 2022-11-23 DIAGNOSIS — Z992 Dependence on renal dialysis: Secondary | ICD-10-CM | POA: Diagnosis not present

## 2022-11-23 DIAGNOSIS — H6991 Unspecified Eustachian tube disorder, right ear: Secondary | ICD-10-CM

## 2022-11-23 MED ORDER — FLUTICASONE PROPIONATE 50 MCG/ACT NA SUSP: 2.0000 | Freq: Every day | NASAL | 0 refills | Status: DC

## 2022-11-23 MED ORDER — MECLIZINE HCL 12.5 MG PO TABS: 12.5000 mg | ORAL_TABLET | Freq: Three times a day (TID) | ORAL | 0 refills | Status: DC | PRN

## 2022-11-23 NOTE — Progress Notes (Signed)
Acute Office Visit  Subjective:     Patient ID: Eric Douglas, male    DOB: [birth year], 52 y.o.   MRN: [id number]  Chief Complaint  Patient presents with   Ear Fullness    Right X 2 weeks     Patient is in today for Ear fullness  States that it is his right ear. States that it has happened before and they could not find anything. States that it feels like pressure that needs to pop, similar to when he flys. States that it will cause vertigo. States that he has been to ENT and did an MRI and did prednisone. States that he had the vertigo Monday, Tuesday, Wednesday. States that on Monday he had diaylsis and the dizziness caused him to be sick . He has tried the Epley exercises in the past that did not help  Review of Systems  Constitutional:  Negative for chills and fever.  HENT:  Positive for ear pain (fullness). Negative for ear discharge, sinus pain and sore throat.   Respiratory:  Negative for shortness of breath.   Cardiovascular:  Negative for chest pain.  Neurological:  Positive for dizziness. Negative for tingling, weakness and headaches.  Psychiatric/Behavioral:  Negative for hallucinations and suicidal ideas.         Objective:    BP (!) 140/76   Pulse 64   Temp 98.2 F (36.8 C)   Resp 16   Ht 6' 5"$  (1.956 m)   Wt (!) 338 lb 6 oz (153.5 kg)   SpO2 99%   BMI 40.13 kg/m    Physical Exam Vitals and nursing note reviewed.  Constitutional:      Appearance: Normal appearance.  HENT:     Right Ear: Tympanic membrane, ear canal and external ear normal.     Left Ear: Tympanic membrane, ear canal and external ear normal.     Mouth/Throat:     Mouth: Mucous membranes are moist.     Pharynx: Oropharynx is clear.  Eyes:     Extraocular Movements: Extraocular movements intact.     Pupils: Pupils are equal, round, and reactive to light.  Cardiovascular:     Rate and Rhythm: Normal rate and regular rhythm.     Heart sounds: Normal heart sounds.  Pulmonary:      Effort: Pulmonary effort is normal.     Breath sounds: Normal breath sounds.  Neurological:     General: No focal deficit present.     Mental Status: He is alert.     Comments: Bilateral upper and lower extremity strength 5/5     No results found for any visits on 11/23/22.      Assessment & Plan:   Problem List Items Addressed This Visit       Nervous and Auditory   Eustachian tube dysfunction, right - Primary    Sounds like history of the same.  Will do fluticasone nasal spray 2 sprays each nostril.  Follow-up if no improvement      Relevant Medications   fluticasone (FLONASE) 50 MCG/ACT nasal spray     Other   Vertigo    Unable to elicit in office.  Will do meclizine 12.5 3 times daily as needed.  Patient is dialysis today Friday and has dialysis later today.  Signs and symptoms reviewed when to seek urgent emergent healthcare      Relevant Medications   meclizine (ANTIVERT) 12.5 MG tablet    Meds ordered this encounter  Medications  fluticasone (FLONASE) 50 MCG/ACT nasal spray    Sig: Place 2 sprays into both nostrils daily.    Dispense:  16 g    Refill:  0    Order Specific Question:   Supervising Provider    Answer:   Loura Pardon A [[id number]]   meclizine (ANTIVERT) 12.5 MG tablet    Sig: Take 1 tablet (12.5 mg total) by mouth 3 (three) times daily as needed for dizziness.    Dispense:  30 tablet    Refill:  0    Order Specific Question:   Supervising Provider    Answer:   Loura Pardon A [[id number]]    Return if symptoms worsen or fail to improve, for As scheduled for CPE.  Romilda Garret, NP

## 2022-11-23 NOTE — Assessment & Plan Note (Signed)
Sounds like history of the same.  Will do fluticasone nasal spray 2 sprays each nostril.  Follow-up if no improvement

## 2022-11-23 NOTE — Patient Instructions (Addendum)
Nice to see you today Let me know if these medications do not help Keep your appointment with me as scheduled for your physical

## 2022-11-23 NOTE — Assessment & Plan Note (Signed)
Unable to elicit in office.  Will do meclizine 12.5 3 times daily as needed.  Patient is dialysis today Friday and has dialysis later today.  Signs and symptoms reviewed when to seek urgent emergent healthcare

## 2022-11-26 DIAGNOSIS — Z992 Dependence on renal dialysis: Secondary | ICD-10-CM | POA: Diagnosis not present

## 2022-11-26 DIAGNOSIS — N2581 Secondary hyperparathyroidism of renal origin: Secondary | ICD-10-CM | POA: Diagnosis not present

## 2022-11-26 DIAGNOSIS — N186 End stage renal disease: Secondary | ICD-10-CM | POA: Diagnosis not present

## 2022-11-26 DIAGNOSIS — D631 Anemia in chronic kidney disease: Secondary | ICD-10-CM | POA: Diagnosis not present

## 2022-11-28 DIAGNOSIS — D631 Anemia in chronic kidney disease: Secondary | ICD-10-CM | POA: Diagnosis not present

## 2022-11-28 DIAGNOSIS — N186 End stage renal disease: Secondary | ICD-10-CM | POA: Diagnosis not present

## 2022-11-28 DIAGNOSIS — N2581 Secondary hyperparathyroidism of renal origin: Secondary | ICD-10-CM | POA: Diagnosis not present

## 2022-11-28 DIAGNOSIS — Z992 Dependence on renal dialysis: Secondary | ICD-10-CM | POA: Diagnosis not present

## 2022-11-30 DIAGNOSIS — D631 Anemia in chronic kidney disease: Secondary | ICD-10-CM | POA: Diagnosis not present

## 2022-11-30 DIAGNOSIS — N186 End stage renal disease: Secondary | ICD-10-CM | POA: Diagnosis not present

## 2022-11-30 DIAGNOSIS — Z992 Dependence on renal dialysis: Secondary | ICD-10-CM | POA: Diagnosis not present

## 2022-11-30 DIAGNOSIS — N2581 Secondary hyperparathyroidism of renal origin: Secondary | ICD-10-CM | POA: Diagnosis not present

## 2022-12-03 DIAGNOSIS — N2581 Secondary hyperparathyroidism of renal origin: Secondary | ICD-10-CM | POA: Diagnosis not present

## 2022-12-03 DIAGNOSIS — D631 Anemia in chronic kidney disease: Secondary | ICD-10-CM | POA: Diagnosis not present

## 2022-12-03 DIAGNOSIS — Z992 Dependence on renal dialysis: Secondary | ICD-10-CM | POA: Diagnosis not present

## 2022-12-03 DIAGNOSIS — N186 End stage renal disease: Secondary | ICD-10-CM | POA: Diagnosis not present

## 2022-12-04 ENCOUNTER — Encounter: Payer: Self-pay | Admitting: Nurse Practitioner

## 2022-12-04 DIAGNOSIS — H6991 Unspecified Eustachian tube disorder, right ear: Secondary | ICD-10-CM

## 2022-12-04 DIAGNOSIS — R42 Dizziness and giddiness: Secondary | ICD-10-CM

## 2022-12-05 DIAGNOSIS — N186 End stage renal disease: Secondary | ICD-10-CM | POA: Diagnosis not present

## 2022-12-05 DIAGNOSIS — D631 Anemia in chronic kidney disease: Secondary | ICD-10-CM | POA: Diagnosis not present

## 2022-12-05 DIAGNOSIS — N2581 Secondary hyperparathyroidism of renal origin: Secondary | ICD-10-CM | POA: Diagnosis not present

## 2022-12-05 DIAGNOSIS — Z992 Dependence on renal dialysis: Secondary | ICD-10-CM | POA: Diagnosis not present

## 2022-12-07 DIAGNOSIS — N186 End stage renal disease: Secondary | ICD-10-CM | POA: Diagnosis not present

## 2022-12-07 DIAGNOSIS — Z992 Dependence on renal dialysis: Secondary | ICD-10-CM | POA: Diagnosis not present

## 2022-12-07 DIAGNOSIS — N2581 Secondary hyperparathyroidism of renal origin: Secondary | ICD-10-CM | POA: Diagnosis not present

## 2022-12-07 DIAGNOSIS — D631 Anemia in chronic kidney disease: Secondary | ICD-10-CM | POA: Diagnosis not present

## 2022-12-10 DIAGNOSIS — N186 End stage renal disease: Secondary | ICD-10-CM | POA: Diagnosis not present

## 2022-12-10 DIAGNOSIS — D631 Anemia in chronic kidney disease: Secondary | ICD-10-CM | POA: Diagnosis not present

## 2022-12-10 DIAGNOSIS — Z992 Dependence on renal dialysis: Secondary | ICD-10-CM | POA: Diagnosis not present

## 2022-12-10 DIAGNOSIS — N2581 Secondary hyperparathyroidism of renal origin: Secondary | ICD-10-CM | POA: Diagnosis not present

## 2022-12-12 DIAGNOSIS — D631 Anemia in chronic kidney disease: Secondary | ICD-10-CM | POA: Diagnosis not present

## 2022-12-12 DIAGNOSIS — N186 End stage renal disease: Secondary | ICD-10-CM | POA: Diagnosis not present

## 2022-12-12 DIAGNOSIS — N2581 Secondary hyperparathyroidism of renal origin: Secondary | ICD-10-CM | POA: Diagnosis not present

## 2022-12-12 DIAGNOSIS — Z992 Dependence on renal dialysis: Secondary | ICD-10-CM | POA: Diagnosis not present

## 2022-12-14 DIAGNOSIS — Z992 Dependence on renal dialysis: Secondary | ICD-10-CM | POA: Diagnosis not present

## 2022-12-14 DIAGNOSIS — N028 Recurrent and persistent hematuria with other morphologic changes: Secondary | ICD-10-CM | POA: Diagnosis not present

## 2022-12-14 DIAGNOSIS — E878 Other disorders of electrolyte and fluid balance, not elsewhere classified: Secondary | ICD-10-CM | POA: Diagnosis not present

## 2022-12-14 DIAGNOSIS — N2581 Secondary hyperparathyroidism of renal origin: Secondary | ICD-10-CM | POA: Diagnosis not present

## 2022-12-14 DIAGNOSIS — N186 End stage renal disease: Secondary | ICD-10-CM | POA: Diagnosis not present

## 2022-12-14 DIAGNOSIS — D631 Anemia in chronic kidney disease: Secondary | ICD-10-CM | POA: Diagnosis not present

## 2022-12-15 ENCOUNTER — Other Ambulatory Visit: Payer: Self-pay | Admitting: Nurse Practitioner

## 2022-12-15 DIAGNOSIS — H6991 Unspecified Eustachian tube disorder, right ear: Secondary | ICD-10-CM

## 2022-12-17 DIAGNOSIS — D631 Anemia in chronic kidney disease: Secondary | ICD-10-CM | POA: Diagnosis not present

## 2022-12-17 DIAGNOSIS — N2581 Secondary hyperparathyroidism of renal origin: Secondary | ICD-10-CM | POA: Diagnosis not present

## 2022-12-17 DIAGNOSIS — N186 End stage renal disease: Secondary | ICD-10-CM | POA: Diagnosis not present

## 2022-12-17 DIAGNOSIS — Z992 Dependence on renal dialysis: Secondary | ICD-10-CM | POA: Diagnosis not present

## 2022-12-17 DIAGNOSIS — E878 Other disorders of electrolyte and fluid balance, not elsewhere classified: Secondary | ICD-10-CM | POA: Diagnosis not present

## 2022-12-19 DIAGNOSIS — N2581 Secondary hyperparathyroidism of renal origin: Secondary | ICD-10-CM | POA: Diagnosis not present

## 2022-12-19 DIAGNOSIS — E878 Other disorders of electrolyte and fluid balance, not elsewhere classified: Secondary | ICD-10-CM | POA: Diagnosis not present

## 2022-12-19 DIAGNOSIS — Z992 Dependence on renal dialysis: Secondary | ICD-10-CM | POA: Diagnosis not present

## 2022-12-19 DIAGNOSIS — N186 End stage renal disease: Secondary | ICD-10-CM | POA: Diagnosis not present

## 2022-12-19 DIAGNOSIS — D631 Anemia in chronic kidney disease: Secondary | ICD-10-CM | POA: Diagnosis not present

## 2022-12-20 ENCOUNTER — Other Ambulatory Visit: Payer: Self-pay | Admitting: Pulmonary Disease

## 2022-12-21 DIAGNOSIS — N2581 Secondary hyperparathyroidism of renal origin: Secondary | ICD-10-CM | POA: Diagnosis not present

## 2022-12-21 DIAGNOSIS — E878 Other disorders of electrolyte and fluid balance, not elsewhere classified: Secondary | ICD-10-CM | POA: Diagnosis not present

## 2022-12-21 DIAGNOSIS — Z992 Dependence on renal dialysis: Secondary | ICD-10-CM | POA: Diagnosis not present

## 2022-12-21 DIAGNOSIS — N186 End stage renal disease: Secondary | ICD-10-CM | POA: Diagnosis not present

## 2022-12-21 DIAGNOSIS — D631 Anemia in chronic kidney disease: Secondary | ICD-10-CM | POA: Diagnosis not present

## 2022-12-24 DIAGNOSIS — E878 Other disorders of electrolyte and fluid balance, not elsewhere classified: Secondary | ICD-10-CM | POA: Diagnosis not present

## 2022-12-24 DIAGNOSIS — D631 Anemia in chronic kidney disease: Secondary | ICD-10-CM | POA: Diagnosis not present

## 2022-12-24 DIAGNOSIS — Z992 Dependence on renal dialysis: Secondary | ICD-10-CM | POA: Diagnosis not present

## 2022-12-24 DIAGNOSIS — N186 End stage renal disease: Secondary | ICD-10-CM | POA: Diagnosis not present

## 2022-12-24 DIAGNOSIS — N2581 Secondary hyperparathyroidism of renal origin: Secondary | ICD-10-CM | POA: Diagnosis not present

## 2022-12-26 DIAGNOSIS — Z992 Dependence on renal dialysis: Secondary | ICD-10-CM | POA: Diagnosis not present

## 2022-12-26 DIAGNOSIS — N2581 Secondary hyperparathyroidism of renal origin: Secondary | ICD-10-CM | POA: Diagnosis not present

## 2022-12-26 DIAGNOSIS — D631 Anemia in chronic kidney disease: Secondary | ICD-10-CM | POA: Diagnosis not present

## 2022-12-26 DIAGNOSIS — E878 Other disorders of electrolyte and fluid balance, not elsewhere classified: Secondary | ICD-10-CM | POA: Diagnosis not present

## 2022-12-26 DIAGNOSIS — N186 End stage renal disease: Secondary | ICD-10-CM | POA: Diagnosis not present

## 2022-12-27 ENCOUNTER — Encounter: Payer: Self-pay | Admitting: Nurse Practitioner

## 2022-12-27 ENCOUNTER — Telehealth: Payer: Self-pay

## 2022-12-27 ENCOUNTER — Ambulatory Visit (INDEPENDENT_AMBULATORY_CARE_PROVIDER_SITE_OTHER): Payer: Medicare Other | Admitting: Nurse Practitioner

## 2022-12-27 VITALS — BP 138/82 | HR 60 | Temp 98.6°F | Resp 16 | Ht 77.25 in | Wt 328.4 lb

## 2022-12-27 DIAGNOSIS — E669 Obesity, unspecified: Secondary | ICD-10-CM

## 2022-12-27 DIAGNOSIS — N186 End stage renal disease: Secondary | ICD-10-CM | POA: Diagnosis not present

## 2022-12-27 DIAGNOSIS — Z0000 Encounter for general adult medical examination without abnormal findings: Secondary | ICD-10-CM | POA: Diagnosis not present

## 2022-12-27 DIAGNOSIS — Z23 Encounter for immunization: Secondary | ICD-10-CM

## 2022-12-27 DIAGNOSIS — I480 Paroxysmal atrial fibrillation: Secondary | ICD-10-CM | POA: Diagnosis not present

## 2022-12-27 DIAGNOSIS — I151 Hypertension secondary to other renal disorders: Secondary | ICD-10-CM

## 2022-12-27 LAB — COMPREHENSIVE METABOLIC PANEL
ALT: 16 U/L (ref 0–53)
AST: 12 U/L (ref 0–37)
Albumin: 4.2 g/dL (ref 3.5–5.2)
Alkaline Phosphatase: 61 U/L (ref 39–117)
BUN: 30 mg/dL — ABNORMAL HIGH (ref 6–23)
CO2: 36 mEq/L — ABNORMAL HIGH (ref 19–32)
Calcium: 9.8 mg/dL (ref 8.4–10.5)
Chloride: 93 mEq/L — ABNORMAL LOW (ref 96–112)
Creatinine, Ser: 9.92 mg/dL (ref 0.40–1.50)
GFR: 5.51 mL/min — CL (ref >60.00)
Glucose, Bld: 77 mg/dL (ref 70–99)
Potassium: 5 mEq/L (ref 3.5–5.1)
Sodium: 139 mEq/L (ref 135–145)
Total Bilirubin: 0.6 mg/dL (ref 0.2–1.2)
Total Protein: 6.8 g/dL (ref 6.0–8.3)

## 2022-12-27 LAB — CBC
HCT: 27 % — ABNORMAL LOW (ref 39.0–52.0)
Hemoglobin: 9.5 g/dL — ABNORMAL LOW (ref 13.0–17.0)
MCHC: 35.1 g/dL (ref 30.0–36.0)
MCV: 103.3 fl — ABNORMAL HIGH (ref 78.0–100.0)
Platelets: 140 10*3/uL — ABNORMAL LOW (ref 150.0–400.0)
RBC: 2.61 Mil/uL — ABNORMAL LOW (ref 4.22–5.81)
RDW: 14.4 % (ref 11.5–15.5)
WBC: 4.1 10*3/uL (ref 4.0–10.5)

## 2022-12-27 LAB — LIPID PANEL
Cholesterol: 182 mg/dL (ref 0–200)
HDL: 35.5 mg/dL — ABNORMAL LOW (ref >39.00)
LDL Cholesterol: 107 mg/dL — ABNORMAL HIGH (ref 0–99)
NonHDL: 146.5
Total CHOL/HDL Ratio: 5
Triglycerides: 196 mg/dL — ABNORMAL HIGH (ref 0.0–149.0)
VLDL: 39.2 mg/dL (ref 0.0–40.0)

## 2022-12-27 NOTE — Assessment & Plan Note (Signed)
Discussed age-appropriate immunizations and screening exams.  Did review patient's social, family, surgical, personal medical history.  Patient is up-to-date on tetanus vaccine and flu vaccine.  Did administer first shingles vaccine in office today.  Patient states he is up-to-date on pneumonia vaccine.  Patient was given information in regards to preventative healthcare maintenance with anticipatory guidance.  Patient is up-to-date on CRC screening is followed by urology twice yearly for PSA and DRE's.

## 2022-12-27 NOTE — Patient Instructions (Signed)
Nice to see you today I will be in touch with the labs once I have them Follow up with me in 1 year, sooner if you need me

## 2022-12-27 NOTE — Progress Notes (Signed)
Established Patient Office Visit  Subjective   Patient ID: Eric Douglas, male    DOB: Mar 02, 1971  Age: 52 y.o. MRN: [id number]  Chief Complaint  Patient presents with   Annual Exam    HPI  ESRD: Patient has IgA nephropathy and is followed by Dr. Posey Pronto through [facility] patient has dialysis. Monday Wednesday Friday in dialysis center   Afib: Patient is followed by Lars Mage, MD patient is currently on amiodarone, apixaban, metoprolol  HTN: Currently maintained on amlodipine, metoprolol.  Patient does have secondary hypertension today end-stage renal disease  OSA: Patient is followed by pulmonology and currently on CPAP therapy.  States he tolerates it well and rest well  for complete physical and follow up of chronic conditions.  Immunizations: -Tetanus: Completed in 2020 -Influenza: up to date -Shingles: first vaccine today -Pneumonia: utd  Diet: Fair diet. Was doing one large meal with snacking. States now he is grazing through the day. Eats 4 times a day Exercise:  States that he has been walking since he has adopted a dog. 4 days week 64mns at a time   [facility]   Dental exam: Completes every 2 months per patient report  Colonoscopy: Completed in 06/30/2020, repeat in 7 years 2028 Lung Cancer Screening: NA  PSA:  patient followed by urology every 6 months for manage PSA  Sleep: gets into bed around 9 and takes some time to fall asleep approx 10. Gets up around 545. Feels rested.      12/27/2022    8:02 AM 11/23/2022    8:35 AM 08/08/2022    9:34 AM  PHQ9 SCORE ONLY  PHQ-9 Total Score 4 4 0      12/27/2022    8:02 AM  GAD 7 : Generalized Anxiety Score  Nervous, Anxious, on Edge 0  Control/stop worrying 0  Worry too much - different things 0  Trouble relaxing 0  Restless 1  Easily annoyed or irritable 1  Afraid - awful might happen 0  Total GAD 7 Score 2  Anxiety Difficulty Not difficult  at all     .   Review of Systems  Constitutional:  Negative for chills and fever.  Respiratory:  Negative for shortness of breath.   Cardiovascular:  Negative for chest pain and leg swelling (depends on fluid).  Gastrointestinal:  Negative for abdominal pain, blood in stool, constipation, diarrhea, nausea and vomiting.       BM daily   Genitourinary:  Negative for dysuria and hematuria.  Neurological:  Positive for tingling. Negative for headaches.  Psychiatric/Behavioral:  Negative for hallucinations and suicidal ideas.       Objective:     BP 138/82   Pulse 60   Temp 98.6 F (37 C)   Resp 16   Ht 6' 5.25" (1.962 m)   Wt (!) 328 lb 6 oz (148.9 kg)   SpO2 97%   BMI 38.69 kg/m    Physical Exam Vitals and nursing note reviewed.  Constitutional:      Appearance: Normal appearance.  HENT:     Right Ear: Tympanic membrane, ear canal and external ear normal.     Left Ear: Tympanic membrane, ear canal and external ear normal.     Mouth/Throat:     Mouth: Mucous membranes are moist.     Pharynx: Oropharynx is clear.  Eyes:     Extraocular Movements: Extraocular movements intact.     Pupils: Pupils are  equal, round, and reactive to light.  Cardiovascular:     Rate and Rhythm: Normal rate and regular rhythm.     Pulses: Normal pulses.     Heart sounds: Normal heart sounds.  Pulmonary:     Effort: Pulmonary effort is normal.     Breath sounds: Normal breath sounds.  Abdominal:     General: Bowel sounds are normal. There is no distension.     Palpations: There is no mass.     Tenderness: There is no abdominal tenderness.     Hernia: No hernia is present.  Musculoskeletal:     Right lower leg: No edema.     Left lower leg: No edema.  Lymphadenopathy:     Cervical: No cervical adenopathy.  Skin:    General: Skin is warm.  Neurological:     General: No focal deficit present.     Mental Status: He is alert.     Comments: Bilateral upper and lower extremity strength  5/5  Psychiatric:        Mood and Affect: Mood normal.        Behavior: Behavior normal.        Thought Content: Thought content normal.        Judgment: Judgment normal.      No results found for any visits on 12/27/22.    The ASCVD Risk score (Arnett DK, et al., 2019) failed to calculate for the following reasons:   The valid total cholesterol range is 130 to 320 mg/dL    Assessment & Plan:   Problem List Items Addressed This Visit       Cardiovascular and Mediastinum   HTN (hypertension) (Chronic)    Patient currently maintained amlodipine and metoprolol.  Blood pressure secondary to renal failure patient blood pressure within normal limits today continue medication as prescribed      Relevant Orders   CBC   Comprehensive metabolic panel   Paroxysmal atrial fibrillation ([place])    Patient currently maintained on amiodarone and apixaban.  He is followed by Lars Mage, MD electrophysiologist to cardiology.  Continue taking medication as prescribed follow-up with specialist as recommended        Genitourinary   ESRD (end stage renal disease) ([place]) (Chronic)    Patient currently followed by Dr. Posey Pronto at [facility].  He does hemodialysis at a center Monday, Wednesday, Friday.  Continue follow-up with nephrology as recommended and doing dialysis as recommended      Relevant Orders   CBC   Comprehensive metabolic panel     Other   Preventative health care - Primary    Discussed age-appropriate immunizations and screening exams.  Did review patient's social, family, surgical, personal medical history.  Patient is up-to-date on tetanus vaccine and flu vaccine.  Did administer first shingles vaccine in office today.  Patient states he is up-to-date on pneumonia vaccine.  Patient was given information in regards to preventative healthcare maintenance with anticipatory guidance.  Patient is up-to-date on CRC screening is followed by urology twice yearly for PSA and  DRE's.      Relevant Orders   CBC   Comprehensive metabolic panel   Lipid panel   Obesity (BMI 30-39.9)    Pending labs today patient is working on physical activity states he is to keep his BMI under 40 to be able to stay eligible for transplant      Relevant Orders   Lipid panel   Other Visit Diagnoses     Need  for shingles vaccine       Relevant Orders   Zoster Recombinant (Shingrix ) (Completed)       Return in about 1 year (around 12/27/2023) for CPE and Labs.    Romilda Garret, NP

## 2022-12-27 NOTE — Assessment & Plan Note (Signed)
Patient currently followed by Dr. Posey Pronto at [facility].  He does hemodialysis at a center Monday, Wednesday, Friday.  Continue follow-up with nephrology as recommended and doing dialysis as recommended

## 2022-12-27 NOTE — Assessment & Plan Note (Signed)
Patient currently maintained on amiodarone and apixaban.  He is followed by Lars Mage, MD electrophysiologist to cardiology.  Continue taking medication as prescribed follow-up with specialist as recommended

## 2022-12-27 NOTE — Assessment & Plan Note (Signed)
Pending labs today patient is working on physical activity states he is to keep his BMI under 40 to be able to stay eligible for transplant

## 2022-12-27 NOTE — Telephone Encounter (Signed)
Noted. Patient is a ESRD hemodialysis patient and followed by Nephrology

## 2022-12-27 NOTE — Assessment & Plan Note (Signed)
Patient currently maintained amlodipine and metoprolol.  Blood pressure secondary to renal failure patient blood pressure within normal limits today continue medication as prescribed

## 2022-12-27 NOTE — Telephone Encounter (Signed)
Call from [place] from [facility] lab is calling with the following critical lab results:  Creatinine 9.92 GFR 5.51  Fwd to [place].

## 2022-12-28 DIAGNOSIS — N186 End stage renal disease: Secondary | ICD-10-CM | POA: Diagnosis not present

## 2022-12-28 DIAGNOSIS — E878 Other disorders of electrolyte and fluid balance, not elsewhere classified: Secondary | ICD-10-CM | POA: Diagnosis not present

## 2022-12-28 DIAGNOSIS — D631 Anemia in chronic kidney disease: Secondary | ICD-10-CM | POA: Diagnosis not present

## 2022-12-28 DIAGNOSIS — N2581 Secondary hyperparathyroidism of renal origin: Secondary | ICD-10-CM | POA: Diagnosis not present

## 2022-12-28 DIAGNOSIS — Z992 Dependence on renal dialysis: Secondary | ICD-10-CM | POA: Diagnosis not present

## 2022-12-29 ENCOUNTER — Other Ambulatory Visit (HOSPITAL_COMMUNITY): Payer: Self-pay | Admitting: Physician Assistant

## 2022-12-31 DIAGNOSIS — N2581 Secondary hyperparathyroidism of renal origin: Secondary | ICD-10-CM | POA: Diagnosis not present

## 2022-12-31 DIAGNOSIS — Z992 Dependence on renal dialysis: Secondary | ICD-10-CM | POA: Diagnosis not present

## 2022-12-31 DIAGNOSIS — Z7682 Awaiting organ transplant status: Secondary | ICD-10-CM | POA: Diagnosis not present

## 2022-12-31 DIAGNOSIS — D631 Anemia in chronic kidney disease: Secondary | ICD-10-CM | POA: Diagnosis not present

## 2022-12-31 DIAGNOSIS — N186 End stage renal disease: Secondary | ICD-10-CM | POA: Diagnosis not present

## 2022-12-31 DIAGNOSIS — E878 Other disorders of electrolyte and fluid balance, not elsewhere classified: Secondary | ICD-10-CM | POA: Diagnosis not present

## 2023-01-02 DIAGNOSIS — Z992 Dependence on renal dialysis: Secondary | ICD-10-CM | POA: Diagnosis not present

## 2023-01-02 DIAGNOSIS — N186 End stage renal disease: Secondary | ICD-10-CM | POA: Diagnosis not present

## 2023-01-02 DIAGNOSIS — D631 Anemia in chronic kidney disease: Secondary | ICD-10-CM | POA: Diagnosis not present

## 2023-01-02 DIAGNOSIS — E878 Other disorders of electrolyte and fluid balance, not elsewhere classified: Secondary | ICD-10-CM | POA: Diagnosis not present

## 2023-01-02 DIAGNOSIS — N2581 Secondary hyperparathyroidism of renal origin: Secondary | ICD-10-CM | POA: Diagnosis not present

## 2023-01-04 DIAGNOSIS — N2581 Secondary hyperparathyroidism of renal origin: Secondary | ICD-10-CM | POA: Diagnosis not present

## 2023-01-04 DIAGNOSIS — E878 Other disorders of electrolyte and fluid balance, not elsewhere classified: Secondary | ICD-10-CM | POA: Diagnosis not present

## 2023-01-04 DIAGNOSIS — N186 End stage renal disease: Secondary | ICD-10-CM | POA: Diagnosis not present

## 2023-01-04 DIAGNOSIS — Z992 Dependence on renal dialysis: Secondary | ICD-10-CM | POA: Diagnosis not present

## 2023-01-04 DIAGNOSIS — D631 Anemia in chronic kidney disease: Secondary | ICD-10-CM | POA: Diagnosis not present

## 2023-01-07 DIAGNOSIS — Z992 Dependence on renal dialysis: Secondary | ICD-10-CM | POA: Diagnosis not present

## 2023-01-07 DIAGNOSIS — E878 Other disorders of electrolyte and fluid balance, not elsewhere classified: Secondary | ICD-10-CM | POA: Diagnosis not present

## 2023-01-07 DIAGNOSIS — N186 End stage renal disease: Secondary | ICD-10-CM | POA: Diagnosis not present

## 2023-01-07 DIAGNOSIS — N2581 Secondary hyperparathyroidism of renal origin: Secondary | ICD-10-CM | POA: Diagnosis not present

## 2023-01-07 DIAGNOSIS — D631 Anemia in chronic kidney disease: Secondary | ICD-10-CM | POA: Diagnosis not present

## 2023-01-09 DIAGNOSIS — D631 Anemia in chronic kidney disease: Secondary | ICD-10-CM | POA: Diagnosis not present

## 2023-01-09 DIAGNOSIS — N186 End stage renal disease: Secondary | ICD-10-CM | POA: Diagnosis not present

## 2023-01-09 DIAGNOSIS — Z992 Dependence on renal dialysis: Secondary | ICD-10-CM | POA: Diagnosis not present

## 2023-01-09 DIAGNOSIS — E878 Other disorders of electrolyte and fluid balance, not elsewhere classified: Secondary | ICD-10-CM | POA: Diagnosis not present

## 2023-01-09 DIAGNOSIS — N2581 Secondary hyperparathyroidism of renal origin: Secondary | ICD-10-CM | POA: Diagnosis not present

## 2023-01-10 ENCOUNTER — Other Ambulatory Visit (HOSPITAL_COMMUNITY): Payer: Self-pay | Admitting: Physician Assistant

## 2023-01-11 DIAGNOSIS — Z992 Dependence on renal dialysis: Secondary | ICD-10-CM | POA: Diagnosis not present

## 2023-01-11 DIAGNOSIS — N186 End stage renal disease: Secondary | ICD-10-CM | POA: Diagnosis not present

## 2023-01-11 DIAGNOSIS — D631 Anemia in chronic kidney disease: Secondary | ICD-10-CM | POA: Diagnosis not present

## 2023-01-11 DIAGNOSIS — N2581 Secondary hyperparathyroidism of renal origin: Secondary | ICD-10-CM | POA: Diagnosis not present

## 2023-01-11 DIAGNOSIS — E878 Other disorders of electrolyte and fluid balance, not elsewhere classified: Secondary | ICD-10-CM | POA: Diagnosis not present

## 2023-01-14 DIAGNOSIS — N028 Recurrent and persistent hematuria with other morphologic changes: Secondary | ICD-10-CM | POA: Diagnosis not present

## 2023-01-14 DIAGNOSIS — N2581 Secondary hyperparathyroidism of renal origin: Secondary | ICD-10-CM | POA: Diagnosis not present

## 2023-01-14 DIAGNOSIS — N186 End stage renal disease: Secondary | ICD-10-CM | POA: Diagnosis not present

## 2023-01-14 DIAGNOSIS — Z992 Dependence on renal dialysis: Secondary | ICD-10-CM | POA: Diagnosis not present

## 2023-01-14 DIAGNOSIS — D509 Iron deficiency anemia, unspecified: Secondary | ICD-10-CM | POA: Diagnosis not present

## 2023-01-16 DIAGNOSIS — D509 Iron deficiency anemia, unspecified: Secondary | ICD-10-CM | POA: Diagnosis not present

## 2023-01-16 DIAGNOSIS — N186 End stage renal disease: Secondary | ICD-10-CM | POA: Diagnosis not present

## 2023-01-16 DIAGNOSIS — Z992 Dependence on renal dialysis: Secondary | ICD-10-CM | POA: Diagnosis not present

## 2023-01-16 DIAGNOSIS — N2581 Secondary hyperparathyroidism of renal origin: Secondary | ICD-10-CM | POA: Diagnosis not present

## 2023-01-18 DIAGNOSIS — N186 End stage renal disease: Secondary | ICD-10-CM | POA: Diagnosis not present

## 2023-01-18 DIAGNOSIS — N2581 Secondary hyperparathyroidism of renal origin: Secondary | ICD-10-CM | POA: Diagnosis not present

## 2023-01-18 DIAGNOSIS — D509 Iron deficiency anemia, unspecified: Secondary | ICD-10-CM | POA: Diagnosis not present

## 2023-01-18 DIAGNOSIS — Z992 Dependence on renal dialysis: Secondary | ICD-10-CM | POA: Diagnosis not present

## 2023-01-21 DIAGNOSIS — N186 End stage renal disease: Secondary | ICD-10-CM | POA: Diagnosis not present

## 2023-01-21 DIAGNOSIS — Z992 Dependence on renal dialysis: Secondary | ICD-10-CM | POA: Diagnosis not present

## 2023-01-21 DIAGNOSIS — D509 Iron deficiency anemia, unspecified: Secondary | ICD-10-CM | POA: Diagnosis not present

## 2023-01-21 DIAGNOSIS — N2581 Secondary hyperparathyroidism of renal origin: Secondary | ICD-10-CM | POA: Diagnosis not present

## 2023-01-22 DIAGNOSIS — R42 Dizziness and giddiness: Secondary | ICD-10-CM | POA: Diagnosis not present

## 2023-01-22 DIAGNOSIS — H903 Sensorineural hearing loss, bilateral: Secondary | ICD-10-CM | POA: Diagnosis not present

## 2023-01-22 DIAGNOSIS — H9311 Tinnitus, right ear: Secondary | ICD-10-CM | POA: Diagnosis not present

## 2023-01-23 DIAGNOSIS — N186 End stage renal disease: Secondary | ICD-10-CM | POA: Diagnosis not present

## 2023-01-23 DIAGNOSIS — D509 Iron deficiency anemia, unspecified: Secondary | ICD-10-CM | POA: Diagnosis not present

## 2023-01-23 DIAGNOSIS — N2581 Secondary hyperparathyroidism of renal origin: Secondary | ICD-10-CM | POA: Diagnosis not present

## 2023-01-23 DIAGNOSIS — Z992 Dependence on renal dialysis: Secondary | ICD-10-CM | POA: Diagnosis not present

## 2023-01-25 DIAGNOSIS — D509 Iron deficiency anemia, unspecified: Secondary | ICD-10-CM | POA: Diagnosis not present

## 2023-01-25 DIAGNOSIS — N186 End stage renal disease: Secondary | ICD-10-CM | POA: Diagnosis not present

## 2023-01-25 DIAGNOSIS — Z992 Dependence on renal dialysis: Secondary | ICD-10-CM | POA: Diagnosis not present

## 2023-01-25 DIAGNOSIS — N2581 Secondary hyperparathyroidism of renal origin: Secondary | ICD-10-CM | POA: Diagnosis not present

## 2023-01-28 DIAGNOSIS — N186 End stage renal disease: Secondary | ICD-10-CM | POA: Diagnosis not present

## 2023-01-28 DIAGNOSIS — Z992 Dependence on renal dialysis: Secondary | ICD-10-CM | POA: Diagnosis not present

## 2023-01-28 DIAGNOSIS — D509 Iron deficiency anemia, unspecified: Secondary | ICD-10-CM | POA: Diagnosis not present

## 2023-01-28 DIAGNOSIS — N2581 Secondary hyperparathyroidism of renal origin: Secondary | ICD-10-CM | POA: Diagnosis not present

## 2023-01-30 DIAGNOSIS — Z992 Dependence on renal dialysis: Secondary | ICD-10-CM | POA: Diagnosis not present

## 2023-01-30 DIAGNOSIS — N2581 Secondary hyperparathyroidism of renal origin: Secondary | ICD-10-CM | POA: Diagnosis not present

## 2023-01-30 DIAGNOSIS — N186 End stage renal disease: Secondary | ICD-10-CM | POA: Diagnosis not present

## 2023-01-30 DIAGNOSIS — D509 Iron deficiency anemia, unspecified: Secondary | ICD-10-CM | POA: Diagnosis not present

## 2023-02-01 DIAGNOSIS — N2581 Secondary hyperparathyroidism of renal origin: Secondary | ICD-10-CM | POA: Diagnosis not present

## 2023-02-01 DIAGNOSIS — N186 End stage renal disease: Secondary | ICD-10-CM | POA: Diagnosis not present

## 2023-02-01 DIAGNOSIS — Z992 Dependence on renal dialysis: Secondary | ICD-10-CM | POA: Diagnosis not present

## 2023-02-01 DIAGNOSIS — D509 Iron deficiency anemia, unspecified: Secondary | ICD-10-CM | POA: Diagnosis not present

## 2023-02-04 DIAGNOSIS — D509 Iron deficiency anemia, unspecified: Secondary | ICD-10-CM | POA: Diagnosis not present

## 2023-02-04 DIAGNOSIS — N2581 Secondary hyperparathyroidism of renal origin: Secondary | ICD-10-CM | POA: Diagnosis not present

## 2023-02-04 DIAGNOSIS — N186 End stage renal disease: Secondary | ICD-10-CM | POA: Diagnosis not present

## 2023-02-04 DIAGNOSIS — Z992 Dependence on renal dialysis: Secondary | ICD-10-CM | POA: Diagnosis not present

## 2023-02-06 DIAGNOSIS — N2581 Secondary hyperparathyroidism of renal origin: Secondary | ICD-10-CM | POA: Diagnosis not present

## 2023-02-06 DIAGNOSIS — D509 Iron deficiency anemia, unspecified: Secondary | ICD-10-CM | POA: Diagnosis not present

## 2023-02-06 DIAGNOSIS — N186 End stage renal disease: Secondary | ICD-10-CM | POA: Diagnosis not present

## 2023-02-06 DIAGNOSIS — Z992 Dependence on renal dialysis: Secondary | ICD-10-CM | POA: Diagnosis not present

## 2023-02-07 ENCOUNTER — Ambulatory Visit (HOSPITAL_COMMUNITY)
Admission: RE | Admit: 2023-02-07 | Discharge: 2023-02-07 | Disposition: A | Discharge status: Home / Self Care | Payer: Medicare Other | Source: Ambulatory Visit | Attending: Physician Assistant | Admitting: Physician Assistant

## 2023-02-07 VITALS — BP 154/84 | HR 58 | Ht 77.25 in | Wt 328.4 lb

## 2023-02-07 DIAGNOSIS — Z7901 Long term (current) use of anticoagulants: Secondary | ICD-10-CM | POA: Insufficient documentation

## 2023-02-07 DIAGNOSIS — E669 Obesity, unspecified: Secondary | ICD-10-CM | POA: Diagnosis not present

## 2023-02-07 DIAGNOSIS — I120 Hypertensive chronic kidney disease with stage 5 chronic kidney disease or end stage renal disease: Secondary | ICD-10-CM | POA: Diagnosis not present

## 2023-02-07 DIAGNOSIS — I38 Endocarditis, valve unspecified: Secondary | ICD-10-CM | POA: Diagnosis not present

## 2023-02-07 DIAGNOSIS — G4733 Obstructive sleep apnea (adult) (pediatric): Secondary | ICD-10-CM | POA: Insufficient documentation

## 2023-02-07 DIAGNOSIS — Z79899 Other long term (current) drug therapy: Secondary | ICD-10-CM | POA: Diagnosis not present

## 2023-02-07 DIAGNOSIS — Z5181 Encounter for therapeutic drug level monitoring: Secondary | ICD-10-CM | POA: Diagnosis not present

## 2023-02-07 DIAGNOSIS — Z0389 Encounter for observation for other suspected diseases and conditions ruled out: Secondary | ICD-10-CM | POA: Diagnosis not present

## 2023-02-07 DIAGNOSIS — N186 End stage renal disease: Secondary | ICD-10-CM | POA: Insufficient documentation

## 2023-02-07 DIAGNOSIS — I4819 Other persistent atrial fibrillation: Secondary | ICD-10-CM

## 2023-02-07 DIAGNOSIS — I480 Paroxysmal atrial fibrillation: Secondary | ICD-10-CM | POA: Diagnosis not present

## 2023-02-07 DIAGNOSIS — Z6838 Body mass index (BMI) 38.0-38.9, adult: Secondary | ICD-10-CM | POA: Insufficient documentation

## 2023-02-07 LAB — TSH: TSH: 3.452 u[IU]/mL (ref 0.350–4.500)

## 2023-02-07 NOTE — Progress Notes (Signed)
Primary Care Physician: Eden Emms, NP Primary Cardiologist: Dr Eden Emms Primary Electrophysiologist: Dr Lalla Brothers Referring Physician: Dr Faylene Million Eric Douglas is a 52 y.o. male with a history of HTN, ESRD, atrial fibrillation who presents for follow up in the [facility]. The patient was initially diagnosed with atrial fibrillation 10/22/21 after presenting to the ED with chest discomfort. He spontaneously converted back to SR. He was placed on a 30 day event monitor to evaluate arrhythmia burden. The monitor detected afib on 10/30/21 and he was started on Eliquis for a CHADS2VASC score of 1. Patient is s/p DCCV on 12/21/21.   On follow up today, patient reports that he has done well since his last visit. He did have one brief episode of afib. No bleeding issues on anticoagulation.   Today, he denies symptoms of palpitations, chest pain, PND, lower extremity edema, dizziness, presyncope, syncope, bleeding, or neurologic sequela. The patient is tolerating medications without difficulties and is otherwise without complaint today.    Atrial Fibrillation Risk Factors:  he does have symptoms or diagnosis of sleep apnea. he is compliant with CPAP therapy.  he does not have a history of rheumatic fever.   he has a BMI of Body mass index is 38.69 kg/m.Marland Kitchen Filed Weights   02/07/23 1504  Weight: (!) 149 kg   Family History  Problem Relation Age of Onset   Cancer Paternal Grandfather 52       brain cancer   Hypertension Other        GP   Stroke Other    Diabetes Other        DM - GP    Heart disease Neg Hx      Atrial Fibrillation Management history:  Previous antiarrhythmic drugs: amiodarone  Previous cardioversions: 12/21/21 Previous ablations: none CHADS2VASC score: 1 Anticoagulation history: Eliquis   Past Medical History:  Diagnosis Date   Fibula fracture    ORIF 1990   Gout    non crystal proven    History of chickenpox    History of  kidney disease    HTN (hypertension)    Nephropathy    Ig A; dx 2002. micropscopic hematuria and proteinuria   Tibia fracture    ORIF 1990   Varicocele    L testes   Venous incompetence    R leg   Past Surgical History:  Procedure Laterality Date   APPENDECTOMY  1982   AV FISTULA PLACEMENT Left 06/27/2017   Procedure: LEFT ARM ARTERIOVENOUS (AV) FISTULA CREATION;  Surgeon: Maeola Harman, MD;  Location: [facility] OR;  Service: Vascular;  Laterality: Left;   CARDIOVERSION N/A 12/21/2021   Procedure: CARDIOVERSION;  Surgeon: Thurmon Fair, MD;  Location: [facility] ENDOSCOPY;  Service: Cardiovascular;  Laterality: N/A;   stab phlebectomy Left 12/17/2016   stab phlebectomy >20 incisions (left leg) by Josephina Gip MD     Current Outpatient Medications  Medication Sig Dispense Refill   allopurinol (ZYLOPRIM) 100 MG tablet Take 100 mg by mouth every morning.  2   amiodarone (PACERONE) 200 MG tablet TAKE 1 TABLET BY MOUTH EVERY DAY 90 tablet 1   amLODipine (NORVASC) 2.5 MG tablet TAKE 1 TABLET BY MOUTH EVERYDAY AT BEDTIME     apixaban (ELIQUIS) 5 MG TABS tablet Take 1 tablet (5 mg total) by mouth 2 (two) times daily. 180 tablet 1   calcitRIOL (ROCALTROL) 0.25 MCG capsule Take 0.5 mcg by mouth every morning.     calcium acetate (  PHOSLO) 667 MG tablet Taking 3-8 tablets by mouth daily with meals and snacks     Calcium Polycarbophil (FIBERCON PO) Take 2 tablets by mouth every morning.     cinacalcet (SENSIPAR) 30 MG tablet Take 30 mg by mouth daily.     eszopiclone (LUNESTA) 2 MG TABS tablet TAKE 1 TABLET (2 MG TOTAL) BY MOUTH IMMEDIATELY BEFORE BEDTIME AS NEEDED FOR SLEEP 30 tablet 3   gabapentin (NEURONTIN) 300 MG capsule Take 300 mg by mouth at bedtime.     lactulose (CHRONULAC) 10 GM/15ML solution Take by mouth as needed.     Methoxy PEG-Epoetin Beta (MIRCERA IJ) Inject into the skin every other day.     metoprolol tartrate (LOPRESSOR) 50 MG tablet TAKE 1 TABLET BY MOUTH 2 (TWO) TIMES DAILY.  MAY TAKE EXTRA TABLET FOR BREAKTHROUGH AFIB 200 tablet 3   sucroferric oxyhydroxide (VELPHORO) 500 MG chewable tablet Chew 500-1,000 mg by mouth See admin instructions. Chew 2 tablets (1000 mg) by mouth once or twice daily with meals, 500 mg with snacks     testosterone cypionate (DEPOTESTOSTERONE CYPIONATE) 200 MG/ML injection SMARTSIG:0.5 Milliliter(s) IM Once a Week     No current facility-administered medications for this encounter.    No Known Allergies  Social History   Socioeconomic History   Marital status: Divorced    Spouse name: Not on file   Number of children: 2   Years of education: Not on file   Highest education level: Not on file  Occupational History   Occupation: Service Tech    Comment: [employer]  Tobacco Use   Smoking status: Never   Smokeless tobacco: Never   Tobacco comments:    Never smoke 12/28/21  Vaping Use   Vaping Use: Never used  Substance and Sexual Activity   Alcohol use: No    Alcohol/week: 0.0 standard drinks of alcohol   Drug use: No   Sexual activity: Yes  Other Topics Concern   Not on file  Social History Narrative   Divorced, primary custody of son; [place]; gets regular exercise.       Pt signed designated party release and gives no access to medical records. Can leave msg on cell [phone].    Social Determinants of Health   Financial Resource Strain: Low Risk  (08/08/2022)   Overall Financial Resource Strain (CARDIA)    Difficulty of Paying Living Expenses: Not hard at all  Food Insecurity: No Food Insecurity (08/08/2022)   Hunger Vital Sign    Worried About Running Out of Food in the Last Year: Never true    Ran Out of Food in the Last Year: Never true  Transportation Needs: No Transportation Needs (08/08/2022)   PRAPARE - Administrator, Civil Service (Medical): No    Lack of Transportation (Non-Medical): No  Physical Activity: Inactive (08/08/2022)   Exercise Vital Sign    Days of  Exercise per Week: 0 days    Minutes of Exercise per Session: 0 min  Stress: No Stress Concern Present (08/08/2022)   [employer] of Occupational Health - Occupational Stress Questionnaire    Feeling of Stress : Not at all  Social Connections: Moderately Isolated (08/08/2022)   Social Connection and Isolation Panel [NHANES]    Frequency of Communication with Friends and Family: More than three times a week    Frequency of Social Gatherings with Friends and Family: More than three times a week    Attends Religious Services: More than 4  times per year    Active Member of Clubs or Organizations: No    Attends Banker Meetings: Never    Marital Status: Divorced  Catering manager Violence: Not At Risk (08/08/2022)   Humiliation, Afraid, Rape, and Kick questionnaire    Fear of Current or Ex-Partner: No    Emotionally Abused: No    Physically Abused: No    Sexually Abused: No     ROS- All systems are reviewed and negative except as per the HPI above.  Physical Exam: Vitals:   02/07/23 1504  BP: (!) 154/84  Pulse: (!) 58  Weight: (!) 149 kg  Height: 6' 5.25" (1.962 m)     GEN- The patient is a well appearing male, alert and oriented x 3 today.   HEENT-head normocephalic, atraumatic, sclera clear, conjunctiva pink, hearing intact, trachea midline. Lungs- Clear to ausculation bilaterally, normal work of breathing Heart- Regular rate and rhythm, no murmurs, rubs or gallops  GI- soft, NT, ND, + BS Extremities- no clubbing, cyanosis, or edema MS- no significant deformity or atrophy Skin- no rash or lesion Psych- euthymic mood, full affect Neuro- strength and sensation are intact   Wt Readings from Last 3 Encounters:  02/07/23 (!) 149 kg  12/27/22 (!) 148.9 kg  11/23/22 (!) 153.5 kg    EKG today demonstrates  SB Vent. rate 58 BPM PR interval 168 ms QRS duration 94 ms QT/QTcB 488/479 ms  Echo 06/25/22 demonstrated   1. Left ventricular ejection fraction,  by estimation, is 55 to 60%. Left  ventricular ejection fraction by 3D volume is 58 %. The left ventricle has  normal function. The left ventricle has no regional wall motion  abnormalities. The left ventricular internal cavity size was mildly dilated. Left ventricular diastolic parameters were normal. The average left ventricular global longitudinal strain is -23.9 %. The global longitudinal strain is normal.   2. Right ventricular systolic function is normal. The right ventricular  size is mildly enlarged. There is mildly elevated pulmonary artery  systolic pressure. The estimated right ventricular systolic pressure is  42.0 mmHg.   3. Left atrial size was mildly dilated.   4. The mitral valve is grossly normal. Mild to moderate mitral valve  regurgitation.   5. The aortic valve is tricuspid. There is mild calcification of the  aortic valve. There is mild thickening of the aortic valve. Aortic valve  regurgitation is not visualized. Aortic valve sclerosis/calcification is  present, without any evidence of  aortic stenosis.   6. The inferior vena cava is dilated in size with <50% respiratory  variability, suggesting right atrial pressure of 15 mmHg.   Comparison(s): Compared to prior study on 10/2021, the MR appears slightly better on current study (more mild-to-moderate). Otherwise, tehr eis no significant change.   Epic records are reviewed at length today  CHA2DS2-VASc Score = 1  The patient's score is based upon: CHF History: 0 HTN History: 1 Diabetes History: 0 Stroke History: 0 Vascular Disease History: 0 Age Score: 0 Gender Score: 0       ASSESSMENT AND PLAN: 1. Persistent atrial fibrillation  The patient's CHA2DS2-VASc score is 1, indicating a 0.6% annual risk of stroke.   Patient appears to be maintaining SR.  Continue Lopressor 50 mg daily (had hypotension and fatigue at higher doses) Not a candidate for invasive EP procedures with ESRD.  Continue amiodarone 200 mg  daily  Recent cmet reviewed. Check TSH today. Also due for CXR.  Continue Eliquis 5 mg  BID (weight >60 kg age < 4)  2. HTN Stable, no changes today.  3. Obesity Body mass index is 38.69 kg/m. Lifestyle modification was discussed and encouraged including regular physical activity and weight reduction.  4. Obstructive sleep apnea Severe OSA Followed by Dr Wynona Neat Encouraged compliance with CPAP therapy.  5. ESRD HD on M/W/F Followed by [facility] On transplant list. Echo and stress test are up to date.   6. Valvular heart disease Moderate MR   Follow up in the AF clinic in 6 months.    Jorja Loa PA-C Afib Clinic [facility] [address] [place], [place] [address] [phone] 02/07/2023 3:48 PM

## 2023-02-08 DIAGNOSIS — Z992 Dependence on renal dialysis: Secondary | ICD-10-CM | POA: Diagnosis not present

## 2023-02-08 DIAGNOSIS — D509 Iron deficiency anemia, unspecified: Secondary | ICD-10-CM | POA: Diagnosis not present

## 2023-02-08 DIAGNOSIS — N186 End stage renal disease: Secondary | ICD-10-CM | POA: Diagnosis not present

## 2023-02-08 DIAGNOSIS — N2581 Secondary hyperparathyroidism of renal origin: Secondary | ICD-10-CM | POA: Diagnosis not present

## 2023-02-11 DIAGNOSIS — N2581 Secondary hyperparathyroidism of renal origin: Secondary | ICD-10-CM | POA: Diagnosis not present

## 2023-02-11 DIAGNOSIS — Z992 Dependence on renal dialysis: Secondary | ICD-10-CM | POA: Diagnosis not present

## 2023-02-11 DIAGNOSIS — D509 Iron deficiency anemia, unspecified: Secondary | ICD-10-CM | POA: Diagnosis not present

## 2023-02-11 DIAGNOSIS — N186 End stage renal disease: Secondary | ICD-10-CM | POA: Diagnosis not present

## 2023-02-13 DIAGNOSIS — N186 End stage renal disease: Secondary | ICD-10-CM | POA: Diagnosis not present

## 2023-02-13 DIAGNOSIS — N028 Recurrent and persistent hematuria with other morphologic changes: Secondary | ICD-10-CM | POA: Diagnosis not present

## 2023-02-13 DIAGNOSIS — Z992 Dependence on renal dialysis: Secondary | ICD-10-CM | POA: Diagnosis not present

## 2023-02-13 DIAGNOSIS — N2581 Secondary hyperparathyroidism of renal origin: Secondary | ICD-10-CM | POA: Diagnosis not present

## 2023-02-13 DIAGNOSIS — Z23 Encounter for immunization: Secondary | ICD-10-CM | POA: Diagnosis not present

## 2023-02-15 DIAGNOSIS — Z23 Encounter for immunization: Secondary | ICD-10-CM | POA: Diagnosis not present

## 2023-02-15 DIAGNOSIS — Z992 Dependence on renal dialysis: Secondary | ICD-10-CM | POA: Diagnosis not present

## 2023-02-15 DIAGNOSIS — N2581 Secondary hyperparathyroidism of renal origin: Secondary | ICD-10-CM | POA: Diagnosis not present

## 2023-02-15 DIAGNOSIS — N186 End stage renal disease: Secondary | ICD-10-CM | POA: Diagnosis not present

## 2023-02-18 DIAGNOSIS — N186 End stage renal disease: Secondary | ICD-10-CM | POA: Diagnosis not present

## 2023-02-18 DIAGNOSIS — Z992 Dependence on renal dialysis: Secondary | ICD-10-CM | POA: Diagnosis not present

## 2023-02-18 DIAGNOSIS — N2581 Secondary hyperparathyroidism of renal origin: Secondary | ICD-10-CM | POA: Diagnosis not present

## 2023-02-18 DIAGNOSIS — Z23 Encounter for immunization: Secondary | ICD-10-CM | POA: Diagnosis not present

## 2023-02-20 DIAGNOSIS — Z23 Encounter for immunization: Secondary | ICD-10-CM | POA: Diagnosis not present

## 2023-02-20 DIAGNOSIS — N186 End stage renal disease: Secondary | ICD-10-CM | POA: Diagnosis not present

## 2023-02-20 DIAGNOSIS — N2581 Secondary hyperparathyroidism of renal origin: Secondary | ICD-10-CM | POA: Diagnosis not present

## 2023-02-20 DIAGNOSIS — Z992 Dependence on renal dialysis: Secondary | ICD-10-CM | POA: Diagnosis not present

## 2023-02-22 DIAGNOSIS — N186 End stage renal disease: Secondary | ICD-10-CM | POA: Diagnosis not present

## 2023-02-22 DIAGNOSIS — Z23 Encounter for immunization: Secondary | ICD-10-CM | POA: Diagnosis not present

## 2023-02-22 DIAGNOSIS — Z992 Dependence on renal dialysis: Secondary | ICD-10-CM | POA: Diagnosis not present

## 2023-02-22 DIAGNOSIS — N2581 Secondary hyperparathyroidism of renal origin: Secondary | ICD-10-CM | POA: Diagnosis not present

## 2023-02-25 DIAGNOSIS — N186 End stage renal disease: Secondary | ICD-10-CM | POA: Diagnosis not present

## 2023-02-25 DIAGNOSIS — Z992 Dependence on renal dialysis: Secondary | ICD-10-CM | POA: Diagnosis not present

## 2023-02-25 DIAGNOSIS — Z23 Encounter for immunization: Secondary | ICD-10-CM | POA: Diagnosis not present

## 2023-02-25 DIAGNOSIS — N2581 Secondary hyperparathyroidism of renal origin: Secondary | ICD-10-CM | POA: Diagnosis not present

## 2023-02-27 DIAGNOSIS — Z23 Encounter for immunization: Secondary | ICD-10-CM | POA: Diagnosis not present

## 2023-02-27 DIAGNOSIS — Z992 Dependence on renal dialysis: Secondary | ICD-10-CM | POA: Diagnosis not present

## 2023-02-27 DIAGNOSIS — N2581 Secondary hyperparathyroidism of renal origin: Secondary | ICD-10-CM | POA: Diagnosis not present

## 2023-02-27 DIAGNOSIS — N186 End stage renal disease: Secondary | ICD-10-CM | POA: Diagnosis not present

## 2023-03-01 DIAGNOSIS — N186 End stage renal disease: Secondary | ICD-10-CM | POA: Diagnosis not present

## 2023-03-01 DIAGNOSIS — Z23 Encounter for immunization: Secondary | ICD-10-CM | POA: Diagnosis not present

## 2023-03-01 DIAGNOSIS — Z992 Dependence on renal dialysis: Secondary | ICD-10-CM | POA: Diagnosis not present

## 2023-03-01 DIAGNOSIS — N2581 Secondary hyperparathyroidism of renal origin: Secondary | ICD-10-CM | POA: Diagnosis not present

## 2023-03-04 DIAGNOSIS — N2581 Secondary hyperparathyroidism of renal origin: Secondary | ICD-10-CM | POA: Diagnosis not present

## 2023-03-04 DIAGNOSIS — Z992 Dependence on renal dialysis: Secondary | ICD-10-CM | POA: Diagnosis not present

## 2023-03-04 DIAGNOSIS — Z23 Encounter for immunization: Secondary | ICD-10-CM | POA: Diagnosis not present

## 2023-03-04 DIAGNOSIS — N186 End stage renal disease: Secondary | ICD-10-CM | POA: Diagnosis not present

## 2023-03-05 DIAGNOSIS — H903 Sensorineural hearing loss, bilateral: Secondary | ICD-10-CM | POA: Diagnosis not present

## 2023-03-06 DIAGNOSIS — N186 End stage renal disease: Secondary | ICD-10-CM | POA: Diagnosis not present

## 2023-03-06 DIAGNOSIS — Z992 Dependence on renal dialysis: Secondary | ICD-10-CM | POA: Diagnosis not present

## 2023-03-06 DIAGNOSIS — N2581 Secondary hyperparathyroidism of renal origin: Secondary | ICD-10-CM | POA: Diagnosis not present

## 2023-03-06 DIAGNOSIS — Z23 Encounter for immunization: Secondary | ICD-10-CM | POA: Diagnosis not present

## 2023-03-08 DIAGNOSIS — Z992 Dependence on renal dialysis: Secondary | ICD-10-CM | POA: Diagnosis not present

## 2023-03-08 DIAGNOSIS — Z23 Encounter for immunization: Secondary | ICD-10-CM | POA: Diagnosis not present

## 2023-03-08 DIAGNOSIS — N2581 Secondary hyperparathyroidism of renal origin: Secondary | ICD-10-CM | POA: Diagnosis not present

## 2023-03-08 DIAGNOSIS — N186 End stage renal disease: Secondary | ICD-10-CM | POA: Diagnosis not present

## 2023-03-11 DIAGNOSIS — N186 End stage renal disease: Secondary | ICD-10-CM | POA: Diagnosis not present

## 2023-03-11 DIAGNOSIS — N2581 Secondary hyperparathyroidism of renal origin: Secondary | ICD-10-CM | POA: Diagnosis not present

## 2023-03-11 DIAGNOSIS — Z992 Dependence on renal dialysis: Secondary | ICD-10-CM | POA: Diagnosis not present

## 2023-03-11 DIAGNOSIS — Z23 Encounter for immunization: Secondary | ICD-10-CM | POA: Diagnosis not present

## 2023-03-13 DIAGNOSIS — N186 End stage renal disease: Secondary | ICD-10-CM | POA: Diagnosis not present

## 2023-03-13 DIAGNOSIS — Z992 Dependence on renal dialysis: Secondary | ICD-10-CM | POA: Diagnosis not present

## 2023-03-13 DIAGNOSIS — N2581 Secondary hyperparathyroidism of renal origin: Secondary | ICD-10-CM | POA: Diagnosis not present

## 2023-03-13 DIAGNOSIS — Z23 Encounter for immunization: Secondary | ICD-10-CM | POA: Diagnosis not present

## 2023-03-15 DIAGNOSIS — Z23 Encounter for immunization: Secondary | ICD-10-CM | POA: Diagnosis not present

## 2023-03-15 DIAGNOSIS — N2581 Secondary hyperparathyroidism of renal origin: Secondary | ICD-10-CM | POA: Diagnosis not present

## 2023-03-15 DIAGNOSIS — Z992 Dependence on renal dialysis: Secondary | ICD-10-CM | POA: Diagnosis not present

## 2023-03-15 DIAGNOSIS — N186 End stage renal disease: Secondary | ICD-10-CM | POA: Diagnosis not present

## 2023-03-18 DIAGNOSIS — N186 End stage renal disease: Secondary | ICD-10-CM | POA: Diagnosis not present

## 2023-03-18 DIAGNOSIS — Z992 Dependence on renal dialysis: Secondary | ICD-10-CM | POA: Diagnosis not present

## 2023-03-18 DIAGNOSIS — D509 Iron deficiency anemia, unspecified: Secondary | ICD-10-CM | POA: Diagnosis not present

## 2023-03-18 DIAGNOSIS — N2581 Secondary hyperparathyroidism of renal origin: Secondary | ICD-10-CM | POA: Diagnosis not present

## 2023-03-20 DIAGNOSIS — N2581 Secondary hyperparathyroidism of renal origin: Secondary | ICD-10-CM | POA: Diagnosis not present

## 2023-03-20 DIAGNOSIS — D509 Iron deficiency anemia, unspecified: Secondary | ICD-10-CM | POA: Diagnosis not present

## 2023-03-20 DIAGNOSIS — N186 End stage renal disease: Secondary | ICD-10-CM | POA: Diagnosis not present

## 2023-03-20 DIAGNOSIS — Z992 Dependence on renal dialysis: Secondary | ICD-10-CM | POA: Diagnosis not present

## 2023-03-22 DIAGNOSIS — N2581 Secondary hyperparathyroidism of renal origin: Secondary | ICD-10-CM | POA: Diagnosis not present

## 2023-03-22 DIAGNOSIS — N186 End stage renal disease: Secondary | ICD-10-CM | POA: Diagnosis not present

## 2023-03-22 DIAGNOSIS — D509 Iron deficiency anemia, unspecified: Secondary | ICD-10-CM | POA: Diagnosis not present

## 2023-03-22 DIAGNOSIS — Z992 Dependence on renal dialysis: Secondary | ICD-10-CM | POA: Diagnosis not present

## 2023-03-25 DIAGNOSIS — N186 End stage renal disease: Secondary | ICD-10-CM | POA: Diagnosis not present

## 2023-03-25 DIAGNOSIS — N2581 Secondary hyperparathyroidism of renal origin: Secondary | ICD-10-CM | POA: Diagnosis not present

## 2023-03-25 DIAGNOSIS — Z992 Dependence on renal dialysis: Secondary | ICD-10-CM | POA: Diagnosis not present

## 2023-03-25 DIAGNOSIS — D509 Iron deficiency anemia, unspecified: Secondary | ICD-10-CM | POA: Diagnosis not present

## 2023-03-27 DIAGNOSIS — N186 End stage renal disease: Secondary | ICD-10-CM | POA: Diagnosis not present

## 2023-03-27 DIAGNOSIS — D509 Iron deficiency anemia, unspecified: Secondary | ICD-10-CM | POA: Diagnosis not present

## 2023-03-27 DIAGNOSIS — Z992 Dependence on renal dialysis: Secondary | ICD-10-CM | POA: Diagnosis not present

## 2023-03-27 DIAGNOSIS — N2581 Secondary hyperparathyroidism of renal origin: Secondary | ICD-10-CM | POA: Diagnosis not present

## 2023-03-29 DIAGNOSIS — Z992 Dependence on renal dialysis: Secondary | ICD-10-CM | POA: Diagnosis not present

## 2023-03-29 DIAGNOSIS — D509 Iron deficiency anemia, unspecified: Secondary | ICD-10-CM | POA: Diagnosis not present

## 2023-03-29 DIAGNOSIS — N2581 Secondary hyperparathyroidism of renal origin: Secondary | ICD-10-CM | POA: Diagnosis not present

## 2023-03-29 DIAGNOSIS — N186 End stage renal disease: Secondary | ICD-10-CM | POA: Diagnosis not present

## 2023-04-01 DIAGNOSIS — Z992 Dependence on renal dialysis: Secondary | ICD-10-CM | POA: Diagnosis not present

## 2023-04-01 DIAGNOSIS — Z7682 Awaiting organ transplant status: Secondary | ICD-10-CM | POA: Diagnosis not present

## 2023-04-01 DIAGNOSIS — N2581 Secondary hyperparathyroidism of renal origin: Secondary | ICD-10-CM | POA: Diagnosis not present

## 2023-04-01 DIAGNOSIS — D509 Iron deficiency anemia, unspecified: Secondary | ICD-10-CM | POA: Diagnosis not present

## 2023-04-01 DIAGNOSIS — N186 End stage renal disease: Secondary | ICD-10-CM | POA: Diagnosis not present

## 2023-04-02 ENCOUNTER — Ambulatory Visit (INDEPENDENT_AMBULATORY_CARE_PROVIDER_SITE_OTHER): Payer: Medicare Other

## 2023-04-02 DIAGNOSIS — Z23 Encounter for immunization: Secondary | ICD-10-CM | POA: Diagnosis not present

## 2023-04-02 NOTE — Progress Notes (Signed)
Per orders of Mordecai Maes, NP , injection of singrix given by Lewanda Rife in right deltoid. Patient tolerated injection well.

## 2023-04-03 DIAGNOSIS — D509 Iron deficiency anemia, unspecified: Secondary | ICD-10-CM | POA: Diagnosis not present

## 2023-04-03 DIAGNOSIS — N186 End stage renal disease: Secondary | ICD-10-CM | POA: Diagnosis not present

## 2023-04-03 DIAGNOSIS — N2581 Secondary hyperparathyroidism of renal origin: Secondary | ICD-10-CM | POA: Diagnosis not present

## 2023-04-03 DIAGNOSIS — Z992 Dependence on renal dialysis: Secondary | ICD-10-CM | POA: Diagnosis not present

## 2023-04-04 DIAGNOSIS — D2262 Melanocytic nevi of left upper limb, including shoulder: Secondary | ICD-10-CM | POA: Diagnosis not present

## 2023-04-04 DIAGNOSIS — D1801 Hemangioma of skin and subcutaneous tissue: Secondary | ICD-10-CM | POA: Diagnosis not present

## 2023-04-04 DIAGNOSIS — D225 Melanocytic nevi of trunk: Secondary | ICD-10-CM | POA: Diagnosis not present

## 2023-04-04 DIAGNOSIS — L813 Cafe au lait spots: Secondary | ICD-10-CM | POA: Diagnosis not present

## 2023-04-04 DIAGNOSIS — D2261 Melanocytic nevi of right upper limb, including shoulder: Secondary | ICD-10-CM | POA: Diagnosis not present

## 2023-04-04 DIAGNOSIS — D2271 Melanocytic nevi of right lower limb, including hip: Secondary | ICD-10-CM | POA: Diagnosis not present

## 2023-04-04 DIAGNOSIS — D2361 Other benign neoplasm of skin of right upper limb, including shoulder: Secondary | ICD-10-CM | POA: Diagnosis not present

## 2023-04-04 DIAGNOSIS — D2272 Melanocytic nevi of left lower limb, including hip: Secondary | ICD-10-CM | POA: Diagnosis not present

## 2023-04-05 DIAGNOSIS — N186 End stage renal disease: Secondary | ICD-10-CM | POA: Diagnosis not present

## 2023-04-05 DIAGNOSIS — D509 Iron deficiency anemia, unspecified: Secondary | ICD-10-CM | POA: Diagnosis not present

## 2023-04-05 DIAGNOSIS — Z992 Dependence on renal dialysis: Secondary | ICD-10-CM | POA: Diagnosis not present

## 2023-04-05 DIAGNOSIS — N2581 Secondary hyperparathyroidism of renal origin: Secondary | ICD-10-CM | POA: Diagnosis not present

## 2023-04-07 DIAGNOSIS — N186 End stage renal disease: Secondary | ICD-10-CM | POA: Diagnosis not present

## 2023-04-07 DIAGNOSIS — G4733 Obstructive sleep apnea (adult) (pediatric): Secondary | ICD-10-CM | POA: Diagnosis not present

## 2023-04-07 DIAGNOSIS — Z79899 Other long term (current) drug therapy: Secondary | ICD-10-CM | POA: Diagnosis not present

## 2023-04-07 DIAGNOSIS — R972 Elevated prostate specific antigen [PSA]: Secondary | ICD-10-CM | POA: Diagnosis not present

## 2023-04-07 DIAGNOSIS — Z992 Dependence on renal dialysis: Secondary | ICD-10-CM | POA: Diagnosis not present

## 2023-04-07 DIAGNOSIS — K5900 Constipation, unspecified: Secondary | ICD-10-CM | POA: Diagnosis not present

## 2023-04-07 DIAGNOSIS — Z1152 Encounter for screening for COVID-19: Secondary | ICD-10-CM | POA: Diagnosis not present

## 2023-04-07 DIAGNOSIS — Z940 Kidney transplant status: Secondary | ICD-10-CM | POA: Diagnosis not present

## 2023-04-07 DIAGNOSIS — D849 Immunodeficiency, unspecified: Secondary | ICD-10-CM | POA: Diagnosis not present

## 2023-04-07 DIAGNOSIS — I480 Paroxysmal atrial fibrillation: Secondary | ICD-10-CM | POA: Diagnosis not present

## 2023-04-07 DIAGNOSIS — Z79890 Hormone replacement therapy (postmenopausal): Secondary | ICD-10-CM | POA: Diagnosis not present

## 2023-04-07 DIAGNOSIS — D84821 Immunodeficiency due to drugs: Secondary | ICD-10-CM | POA: Diagnosis not present

## 2023-04-07 DIAGNOSIS — Z452 Encounter for adjustment and management of vascular access device: Secondary | ICD-10-CM | POA: Diagnosis not present

## 2023-04-07 DIAGNOSIS — N02B9 Other recurrent and persistent immunoglobulin A nephropathy: Secondary | ICD-10-CM | POA: Diagnosis not present

## 2023-04-07 DIAGNOSIS — T8619 Other complication of kidney transplant: Secondary | ICD-10-CM | POA: Diagnosis not present

## 2023-04-07 DIAGNOSIS — R000 Tachycardia, unspecified: Secondary | ICD-10-CM | POA: Diagnosis not present

## 2023-04-07 DIAGNOSIS — Z7901 Long term (current) use of anticoagulants: Secondary | ICD-10-CM | POA: Diagnosis not present

## 2023-04-07 DIAGNOSIS — R34 Anuria and oliguria: Secondary | ICD-10-CM | POA: Diagnosis not present

## 2023-04-07 DIAGNOSIS — I4891 Unspecified atrial fibrillation: Secondary | ICD-10-CM | POA: Diagnosis not present

## 2023-04-07 DIAGNOSIS — G629 Polyneuropathy, unspecified: Secondary | ICD-10-CM | POA: Diagnosis not present

## 2023-04-07 DIAGNOSIS — H905 Unspecified sensorineural hearing loss: Secondary | ICD-10-CM | POA: Diagnosis not present

## 2023-04-07 DIAGNOSIS — M79621 Pain in right upper arm: Secondary | ICD-10-CM | POA: Diagnosis not present

## 2023-04-07 DIAGNOSIS — N039 Chronic nephritic syndrome with unspecified morphologic changes: Secondary | ICD-10-CM | POA: Diagnosis not present

## 2023-04-07 DIAGNOSIS — D899 Disorder involving the immune mechanism, unspecified: Secondary | ICD-10-CM | POA: Diagnosis not present

## 2023-04-07 DIAGNOSIS — Z7952 Long term (current) use of systemic steroids: Secondary | ICD-10-CM | POA: Diagnosis not present

## 2023-04-07 DIAGNOSIS — Z7682 Awaiting organ transplant status: Secondary | ICD-10-CM | POA: Diagnosis not present

## 2023-04-07 DIAGNOSIS — I4819 Other persistent atrial fibrillation: Secondary | ICD-10-CM | POA: Diagnosis not present

## 2023-04-07 DIAGNOSIS — I340 Nonrheumatic mitral (valve) insufficiency: Secondary | ICD-10-CM | POA: Diagnosis not present

## 2023-04-07 DIAGNOSIS — R93421 Abnormal radiologic findings on diagnostic imaging of right kidney: Secondary | ICD-10-CM | POA: Diagnosis not present

## 2023-04-07 DIAGNOSIS — R001 Bradycardia, unspecified: Secondary | ICD-10-CM | POA: Diagnosis not present

## 2023-04-07 DIAGNOSIS — M109 Gout, unspecified: Secondary | ICD-10-CM | POA: Diagnosis not present

## 2023-04-07 DIAGNOSIS — N02B1 Recurrent and persistent immunoglobulin A nephropathy with glomerular lesion: Secondary | ICD-10-CM | POA: Diagnosis not present

## 2023-04-07 DIAGNOSIS — Z79621 Long term (current) use of calcineurin inhibitor: Secondary | ICD-10-CM | POA: Diagnosis not present

## 2023-04-07 DIAGNOSIS — I120 Hypertensive chronic kidney disease with stage 5 chronic kidney disease or end stage renal disease: Secondary | ICD-10-CM | POA: Diagnosis not present

## 2023-04-07 HISTORY — PX: KIDNEY TRANSPLANT: SHX239

## 2023-04-15 DIAGNOSIS — Z940 Kidney transplant status: Secondary | ICD-10-CM | POA: Diagnosis not present

## 2023-04-15 DIAGNOSIS — Z992 Dependence on renal dialysis: Secondary | ICD-10-CM | POA: Diagnosis not present

## 2023-04-15 DIAGNOSIS — Z5181 Encounter for therapeutic drug level monitoring: Secondary | ICD-10-CM | POA: Diagnosis not present

## 2023-04-15 DIAGNOSIS — N2581 Secondary hyperparathyroidism of renal origin: Secondary | ICD-10-CM | POA: Diagnosis not present

## 2023-04-15 DIAGNOSIS — N186 End stage renal disease: Secondary | ICD-10-CM | POA: Diagnosis not present

## 2023-04-16 ENCOUNTER — Telehealth: Payer: Self-pay | Admitting: *Deleted

## 2023-04-16 DIAGNOSIS — I482 Chronic atrial fibrillation, unspecified: Secondary | ICD-10-CM | POA: Diagnosis not present

## 2023-04-16 DIAGNOSIS — Z125 Encounter for screening for malignant neoplasm of prostate: Secondary | ICD-10-CM | POA: Diagnosis not present

## 2023-04-16 DIAGNOSIS — D84821 Immunodeficiency due to drugs: Secondary | ICD-10-CM | POA: Diagnosis not present

## 2023-04-16 DIAGNOSIS — I10 Essential (primary) hypertension: Secondary | ICD-10-CM | POA: Diagnosis not present

## 2023-04-16 DIAGNOSIS — G8918 Other acute postprocedural pain: Secondary | ICD-10-CM | POA: Diagnosis not present

## 2023-04-16 DIAGNOSIS — Z79621 Long term (current) use of calcineurin inhibitor: Secondary | ICD-10-CM | POA: Diagnosis not present

## 2023-04-16 DIAGNOSIS — Z992 Dependence on renal dialysis: Secondary | ICD-10-CM | POA: Diagnosis not present

## 2023-04-16 DIAGNOSIS — Z940 Kidney transplant status: Secondary | ICD-10-CM | POA: Diagnosis not present

## 2023-04-16 DIAGNOSIS — Z7289 Other problems related to lifestyle: Secondary | ICD-10-CM | POA: Diagnosis not present

## 2023-04-16 DIAGNOSIS — T8619 Other complication of kidney transplant: Secondary | ICD-10-CM | POA: Diagnosis not present

## 2023-04-16 DIAGNOSIS — Z79899 Other long term (current) drug therapy: Secondary | ICD-10-CM | POA: Diagnosis not present

## 2023-04-16 DIAGNOSIS — D849 Immunodeficiency, unspecified: Secondary | ICD-10-CM | POA: Diagnosis not present

## 2023-04-16 DIAGNOSIS — Z7952 Long term (current) use of systemic steroids: Secondary | ICD-10-CM | POA: Diagnosis not present

## 2023-04-16 DIAGNOSIS — Z7682 Awaiting organ transplant status: Secondary | ICD-10-CM | POA: Diagnosis not present

## 2023-04-16 DIAGNOSIS — Z1159 Encounter for screening for other viral diseases: Secondary | ICD-10-CM | POA: Diagnosis not present

## 2023-04-16 DIAGNOSIS — Z792 Long term (current) use of antibiotics: Secondary | ICD-10-CM | POA: Diagnosis not present

## 2023-04-16 DIAGNOSIS — Z79624 Long term (current) use of inhibitors of nucleotide synthesis: Secondary | ICD-10-CM | POA: Diagnosis not present

## 2023-04-16 DIAGNOSIS — Z114 Encounter for screening for human immunodeficiency virus [HIV]: Secondary | ICD-10-CM | POA: Diagnosis not present

## 2023-04-16 NOTE — Transitions of Care (Post Inpatient/ED Visit) (Signed)
04/16/2023  Name: Eric Douglas MRN: [id number] DOB: 03/13/1971  Today's TOC FU Call Status: Today's TOC FU Call Status:: Successful TOC FU Call Competed TOC FU Call Complete Date: 04/16/23  Transition Care Management Follow-up Telephone Call Date of Discharge: 04/13/23 Discharge Facility: Other ([facility]) Name of Other ([facility]) Discharge Facility: [facility] Type of Discharge: Inpatient Admission Primary Inpatient Discharge Diagnosis:: ESRD/Transplant How have you been since you were released from the hospital?: Better Any questions or concerns?: No  Items Reviewed: Did you receive and understand the discharge instructions provided?: Yes Medications obtained,verified, and reconciled?: Yes (Medications Reviewed) Any new allergies since your discharge?: No Dietary orders reviewed?: No Do you have support at home?: Yes People in Home: parent(s) Name of Support/Comfort Primary Source: Liborio Nixon  Medications Reviewed Today: Medications Reviewed Today     Reviewed by Learta Codding, CMA (Certified Medical Assistant) on 02/07/23 at 1519  Med List Status: <None>   Medication Order Taking? Sig Documenting Provider Last Dose Status Informant  allopurinol (ZYLOPRIM) 100 MG tablet [id number] Yes Take 100 mg by mouth every morning. Annie Sable, MD Taking Active Self           Med Note Damita Lack   Tue Mar 21, 2021  3:17 PM)    amiodarone (PACERONE) 200 MG tablet [id number] Yes TAKE 1 TABLET BY MOUTH EVERY DAY Fenton, Clint R, PA Taking Active   amLODipine (NORVASC) 2.5 MG tablet [id number] Yes TAKE 1 TABLET BY MOUTH EVERYDAY AT BEDTIME [provider] Taking Active   apixaban (ELIQUIS) 5 MG TABS tablet [id number] Yes Take 1 tablet (5 mg total) by mouth 2 (two) times daily. Fenton, Clint R, PA Taking Active   calcitRIOL (ROCALTROL) 0.25 MCG capsule [id number] Yes Take 0.5 mcg by mouth every morning. Annie Sable, MD Taking Active Self  calcium acetate  (PHOSLO) 667 MG tablet [id number] Yes Taking 3-8 tablets by mouth daily with meals and snacks [provider] Taking Active   Calcium Polycarbophil (FIBERCON PO) [id number] Yes Take 2 tablets by mouth every morning. [provider] Taking Active Self  cinacalcet (SENSIPAR) 30 MG tablet [id number] Yes Take 30 mg by mouth daily. [provider] Taking Active   eszopiclone (LUNESTA) 2 MG TABS tablet [id number] Yes TAKE 1 TABLET (2 MG TOTAL) BY MOUTH IMMEDIATELY BEFORE BEDTIME AS NEEDED FOR SLEEP Olalere, Adewale A, MD Taking Active   gabapentin (NEURONTIN) 300 MG capsule [id number] Yes Take 300 mg by mouth at bedtime. Annie Sable, MD Taking Active Self  lactulose [facility]) 10 GM/15ML solution [id number] Yes Take by mouth as needed. [provider] Taking Active   Methoxy PEG-Epoetin Beta (MIRCERA IJ) [id number] Yes Inject into the skin every other day. Annie Sable, MD Taking Active Self  metoprolol tartrate (LOPRESSOR) 50 MG tablet [id number] Yes TAKE 1 TABLET BY MOUTH 2 (TWO) TIMES DAILY. MAY TAKE EXTRA TABLET FOR BREAKTHROUGH AFIB Fenton, Clint R, PA Taking Active   sucroferric oxyhydroxide (VELPHORO) 500 MG chewable tablet [id number] Yes Chew 500-1,000 mg by mouth See admin instructions. Chew 2 tablets (1000 mg) by mouth once or twice daily with meals, 500 mg with snacks Annie Sable, MD Taking Active Self  testosterone cypionate (DEPOTESTOSTERONE CYPIONATE) 200 MG/ML injection [id number] Yes SMARTSIG:0.5 Milliliter(s) IM Once a Week [provider] Taking Active             Home Care and Equipment/Supplies: Were Home Health Services Ordered?: NA Any new equipment or medical supplies ordered?: NA  Functional Questionnaire: Do you need  assistance with bathing/showering or dressing?: No Do you need assistance with meal preparation?: Yes Do you need assistance with eating?: No Do you have difficulty maintaining continence: No Do  you have difficulty managing or taking your medications?: No  Follow up appointments reviewed: PCP Follow-up appointment confirmed?: NA Specialist Hospital Follow-up appointment confirmed?: Yes Date of Specialist follow-up appointment?: 04/16/23 Follow-Up Specialty Provider:: [id number] [facility], Tiburcio Pea NP, Pharmacym [id number] [facility], [id number] Urology Do you need transportation to your follow-up appointment?: No Do you understand care options if your condition(s) worsen?: Yes-patient verbalized understanding  SDOH Interventions Today    Flowsheet Row Most Recent Value  SDOH Interventions   Food Insecurity Interventions Intervention Not Indicated  Housing Interventions Intervention Not Indicated  Transportation Interventions Intervention Not Indicated, Patient Resources (Friends/Family)  [mother is transporting pt to appts]      Interventions Today    Flowsheet Row Most Recent Value  General Interventions   General Interventions Discussed/Reviewed General Interventions Discussed, General Interventions Reviewed, Doctor Visits  Doctor Visits Discussed/Reviewed Doctor Visits Discussed, Doctor Visits Reviewed  Pharmacy Interventions   Pharmacy Dicussed/Reviewed Pharmacy Topics Discussed  [Patient has received all his medications]      TOC Interventions Today    Flowsheet Row Most Recent Value  TOC Interventions   TOC Interventions Discussed/Reviewed TOC Interventions Discussed, TOC Interventions Reviewed  [RN discussed dialsis M-W-F. RN discussed appts]        Gean Maidens BSN RN [facility] [phone]

## 2023-04-17 DIAGNOSIS — N186 End stage renal disease: Secondary | ICD-10-CM | POA: Diagnosis not present

## 2023-04-17 DIAGNOSIS — D849 Immunodeficiency, unspecified: Secondary | ICD-10-CM | POA: Diagnosis not present

## 2023-04-17 DIAGNOSIS — Z940 Kidney transplant status: Secondary | ICD-10-CM | POA: Diagnosis not present

## 2023-04-17 DIAGNOSIS — Z992 Dependence on renal dialysis: Secondary | ICD-10-CM | POA: Diagnosis not present

## 2023-04-17 DIAGNOSIS — N2581 Secondary hyperparathyroidism of renal origin: Secondary | ICD-10-CM | POA: Diagnosis not present

## 2023-04-19 DIAGNOSIS — N2581 Secondary hyperparathyroidism of renal origin: Secondary | ICD-10-CM | POA: Diagnosis not present

## 2023-04-19 DIAGNOSIS — N186 End stage renal disease: Secondary | ICD-10-CM | POA: Diagnosis not present

## 2023-04-19 DIAGNOSIS — Z992 Dependence on renal dialysis: Secondary | ICD-10-CM | POA: Diagnosis not present

## 2023-04-22 DIAGNOSIS — Z992 Dependence on renal dialysis: Secondary | ICD-10-CM | POA: Diagnosis not present

## 2023-04-22 DIAGNOSIS — N186 End stage renal disease: Secondary | ICD-10-CM | POA: Diagnosis not present

## 2023-04-22 DIAGNOSIS — N2581 Secondary hyperparathyroidism of renal origin: Secondary | ICD-10-CM | POA: Diagnosis not present

## 2023-04-22 DIAGNOSIS — Z940 Kidney transplant status: Secondary | ICD-10-CM | POA: Diagnosis not present

## 2023-04-22 DIAGNOSIS — D849 Immunodeficiency, unspecified: Secondary | ICD-10-CM | POA: Diagnosis not present

## 2023-04-23 DIAGNOSIS — Z940 Kidney transplant status: Secondary | ICD-10-CM | POA: Diagnosis not present

## 2023-04-23 DIAGNOSIS — G8918 Other acute postprocedural pain: Secondary | ICD-10-CM | POA: Diagnosis not present

## 2023-04-23 DIAGNOSIS — D84821 Immunodeficiency due to drugs: Secondary | ICD-10-CM | POA: Diagnosis not present

## 2023-04-23 DIAGNOSIS — Z79899 Other long term (current) drug therapy: Secondary | ICD-10-CM | POA: Diagnosis not present

## 2023-04-23 DIAGNOSIS — Z7952 Long term (current) use of systemic steroids: Secondary | ICD-10-CM | POA: Diagnosis not present

## 2023-04-23 DIAGNOSIS — I482 Chronic atrial fibrillation, unspecified: Secondary | ICD-10-CM | POA: Diagnosis not present

## 2023-04-23 DIAGNOSIS — I10 Essential (primary) hypertension: Secondary | ICD-10-CM | POA: Diagnosis not present

## 2023-04-23 DIAGNOSIS — Z792 Long term (current) use of antibiotics: Secondary | ICD-10-CM | POA: Diagnosis not present

## 2023-04-23 DIAGNOSIS — T8619 Other complication of kidney transplant: Secondary | ICD-10-CM | POA: Diagnosis not present

## 2023-04-23 DIAGNOSIS — Z5181 Encounter for therapeutic drug level monitoring: Secondary | ICD-10-CM | POA: Diagnosis not present

## 2023-04-23 DIAGNOSIS — D849 Immunodeficiency, unspecified: Secondary | ICD-10-CM | POA: Diagnosis not present

## 2023-04-23 DIAGNOSIS — I4891 Unspecified atrial fibrillation: Secondary | ICD-10-CM | POA: Diagnosis not present

## 2023-04-23 DIAGNOSIS — D649 Anemia, unspecified: Secondary | ICD-10-CM | POA: Diagnosis not present

## 2023-04-25 DIAGNOSIS — Z940 Kidney transplant status: Secondary | ICD-10-CM | POA: Diagnosis not present

## 2023-04-25 DIAGNOSIS — Z5181 Encounter for therapeutic drug level monitoring: Secondary | ICD-10-CM | POA: Diagnosis not present

## 2023-04-29 DIAGNOSIS — Z940 Kidney transplant status: Secondary | ICD-10-CM | POA: Diagnosis not present

## 2023-04-29 DIAGNOSIS — Z5181 Encounter for therapeutic drug level monitoring: Secondary | ICD-10-CM | POA: Diagnosis not present

## 2023-04-30 DIAGNOSIS — D631 Anemia in chronic kidney disease: Secondary | ICD-10-CM | POA: Diagnosis not present

## 2023-04-30 DIAGNOSIS — Z4822 Encounter for aftercare following kidney transplant: Secondary | ICD-10-CM | POA: Diagnosis not present

## 2023-04-30 DIAGNOSIS — Z940 Kidney transplant status: Secondary | ICD-10-CM | POA: Diagnosis not present

## 2023-04-30 DIAGNOSIS — D84821 Immunodeficiency due to drugs: Secondary | ICD-10-CM | POA: Diagnosis not present

## 2023-04-30 DIAGNOSIS — D638 Anemia in other chronic diseases classified elsewhere: Secondary | ICD-10-CM | POA: Diagnosis not present

## 2023-04-30 DIAGNOSIS — Z79624 Long term (current) use of inhibitors of nucleotide synthesis: Secondary | ICD-10-CM | POA: Diagnosis not present

## 2023-04-30 DIAGNOSIS — D849 Immunodeficiency, unspecified: Secondary | ICD-10-CM | POA: Diagnosis not present

## 2023-04-30 DIAGNOSIS — Z792 Long term (current) use of antibiotics: Secondary | ICD-10-CM | POA: Diagnosis not present

## 2023-04-30 DIAGNOSIS — T50995A Adverse effect of other drugs, medicaments and biological substances, initial encounter: Secondary | ICD-10-CM | POA: Diagnosis not present

## 2023-04-30 DIAGNOSIS — N184 Chronic kidney disease, stage 4 (severe): Secondary | ICD-10-CM | POA: Diagnosis not present

## 2023-04-30 DIAGNOSIS — Z79899 Other long term (current) drug therapy: Secondary | ICD-10-CM | POA: Diagnosis not present

## 2023-04-30 DIAGNOSIS — I4891 Unspecified atrial fibrillation: Secondary | ICD-10-CM | POA: Diagnosis not present

## 2023-04-30 DIAGNOSIS — T8619 Other complication of kidney transplant: Secondary | ICD-10-CM | POA: Diagnosis not present

## 2023-04-30 DIAGNOSIS — I482 Chronic atrial fibrillation, unspecified: Secondary | ICD-10-CM | POA: Diagnosis not present

## 2023-04-30 DIAGNOSIS — I10 Essential (primary) hypertension: Secondary | ICD-10-CM | POA: Diagnosis not present

## 2023-04-30 DIAGNOSIS — Z7901 Long term (current) use of anticoagulants: Secondary | ICD-10-CM | POA: Diagnosis not present

## 2023-05-03 DIAGNOSIS — Z940 Kidney transplant status: Secondary | ICD-10-CM | POA: Diagnosis not present

## 2023-05-03 DIAGNOSIS — D849 Immunodeficiency, unspecified: Secondary | ICD-10-CM | POA: Diagnosis not present

## 2023-05-06 DIAGNOSIS — D849 Immunodeficiency, unspecified: Secondary | ICD-10-CM | POA: Diagnosis not present

## 2023-05-06 DIAGNOSIS — Z940 Kidney transplant status: Secondary | ICD-10-CM | POA: Diagnosis not present

## 2023-05-10 ENCOUNTER — Other Ambulatory Visit: Payer: Self-pay | Admitting: Pulmonary Disease

## 2023-05-14 DIAGNOSIS — D849 Immunodeficiency, unspecified: Secondary | ICD-10-CM | POA: Diagnosis not present

## 2023-05-14 DIAGNOSIS — Z940 Kidney transplant status: Secondary | ICD-10-CM | POA: Diagnosis not present

## 2023-05-14 DIAGNOSIS — D84821 Immunodeficiency due to drugs: Secondary | ICD-10-CM | POA: Diagnosis not present

## 2023-05-14 DIAGNOSIS — Z79621 Long term (current) use of calcineurin inhibitor: Secondary | ICD-10-CM | POA: Diagnosis not present

## 2023-05-14 DIAGNOSIS — I10 Essential (primary) hypertension: Secondary | ICD-10-CM | POA: Diagnosis not present

## 2023-05-14 DIAGNOSIS — Z7901 Long term (current) use of anticoagulants: Secondary | ICD-10-CM | POA: Diagnosis not present

## 2023-05-14 DIAGNOSIS — D638 Anemia in other chronic diseases classified elsewhere: Secondary | ICD-10-CM | POA: Diagnosis not present

## 2023-05-14 DIAGNOSIS — I4891 Unspecified atrial fibrillation: Secondary | ICD-10-CM | POA: Diagnosis not present

## 2023-05-14 DIAGNOSIS — Z4822 Encounter for aftercare following kidney transplant: Secondary | ICD-10-CM | POA: Diagnosis not present

## 2023-05-14 DIAGNOSIS — Z79624 Long term (current) use of inhibitors of nucleotide synthesis: Secondary | ICD-10-CM | POA: Diagnosis not present

## 2023-05-14 DIAGNOSIS — Z792 Long term (current) use of antibiotics: Secondary | ICD-10-CM | POA: Diagnosis not present

## 2023-05-14 DIAGNOSIS — Z79899 Other long term (current) drug therapy: Secondary | ICD-10-CM | POA: Diagnosis not present

## 2023-05-14 DIAGNOSIS — Z7952 Long term (current) use of systemic steroids: Secondary | ICD-10-CM | POA: Diagnosis not present

## 2023-05-15 NOTE — Telephone Encounter (Signed)
Dr. Wynona Neat, please advise if you are ok with this Lunesta refill. Thanks!

## 2023-05-16 ENCOUNTER — Other Ambulatory Visit: Payer: Self-pay | Admitting: Pulmonary Disease

## 2023-05-16 NOTE — Telephone Encounter (Signed)
Patient called to request a new script for his eszopiclone (LUNESTA) 2 MG TABS tablet be sent to his pharmacy.  Please advise and call if there are any other questions.  CB# [phone]

## 2023-05-21 DIAGNOSIS — I482 Chronic atrial fibrillation, unspecified: Secondary | ICD-10-CM | POA: Diagnosis not present

## 2023-05-21 DIAGNOSIS — Z6837 Body mass index (BMI) 37.0-37.9, adult: Secondary | ICD-10-CM | POA: Diagnosis not present

## 2023-05-21 DIAGNOSIS — I10 Essential (primary) hypertension: Secondary | ICD-10-CM | POA: Diagnosis not present

## 2023-05-21 DIAGNOSIS — I770 Arteriovenous fistula, acquired: Secondary | ICD-10-CM | POA: Diagnosis not present

## 2023-05-21 DIAGNOSIS — Z940 Kidney transplant status: Secondary | ICD-10-CM | POA: Diagnosis not present

## 2023-05-21 MED ORDER — ESZOPICLONE 2 MG PO TABS: 2.0000 mg | ORAL_TABLET | Freq: Every day | ORAL | 3 refills | Status: DC

## 2023-05-21 NOTE — Telephone Encounter (Signed)
Patient returning call- please call back at [phone].

## 2023-05-21 NOTE — Telephone Encounter (Signed)
Called patient but he did not answer. Left message for him to call back.  

## 2023-05-21 NOTE — Telephone Encounter (Signed)
Please resend Alfonso Patten as it failed to send.

## 2023-05-23 DIAGNOSIS — Z7901 Long term (current) use of anticoagulants: Secondary | ICD-10-CM | POA: Diagnosis not present

## 2023-05-23 DIAGNOSIS — Z79624 Long term (current) use of inhibitors of nucleotide synthesis: Secondary | ICD-10-CM | POA: Diagnosis not present

## 2023-05-23 DIAGNOSIS — Z79621 Long term (current) use of calcineurin inhibitor: Secondary | ICD-10-CM | POA: Diagnosis not present

## 2023-05-23 DIAGNOSIS — D84821 Immunodeficiency due to drugs: Secondary | ICD-10-CM | POA: Diagnosis not present

## 2023-05-23 DIAGNOSIS — R9389 Abnormal findings on diagnostic imaging of other specified body structures: Secondary | ICD-10-CM | POA: Diagnosis not present

## 2023-05-23 DIAGNOSIS — Z2989 Encounter for other specified prophylactic measures: Secondary | ICD-10-CM | POA: Diagnosis not present

## 2023-05-23 DIAGNOSIS — D849 Immunodeficiency, unspecified: Secondary | ICD-10-CM | POA: Diagnosis not present

## 2023-05-23 DIAGNOSIS — Z940 Kidney transplant status: Secondary | ICD-10-CM | POA: Diagnosis not present

## 2023-05-23 DIAGNOSIS — D638 Anemia in other chronic diseases classified elsewhere: Secondary | ICD-10-CM | POA: Diagnosis not present

## 2023-05-23 DIAGNOSIS — Z79899 Other long term (current) drug therapy: Secondary | ICD-10-CM | POA: Diagnosis not present

## 2023-05-23 DIAGNOSIS — Z4822 Encounter for aftercare following kidney transplant: Secondary | ICD-10-CM | POA: Diagnosis not present

## 2023-05-23 DIAGNOSIS — Z7952 Long term (current) use of systemic steroids: Secondary | ICD-10-CM | POA: Diagnosis not present

## 2023-05-23 DIAGNOSIS — I10 Essential (primary) hypertension: Secondary | ICD-10-CM | POA: Diagnosis not present

## 2023-05-27 ENCOUNTER — Encounter: Payer: Self-pay | Admitting: Nurse Practitioner

## 2023-05-27 ENCOUNTER — Ambulatory Visit (INDEPENDENT_AMBULATORY_CARE_PROVIDER_SITE_OTHER): Payer: Medicare Other | Admitting: Nurse Practitioner

## 2023-05-27 VITALS — BP 132/76 | HR 71 | Temp 97.5°F | Ht 77.25 in | Wt 296.6 lb

## 2023-05-27 DIAGNOSIS — I480 Paroxysmal atrial fibrillation: Secondary | ICD-10-CM

## 2023-05-27 DIAGNOSIS — Z09 Encounter for follow-up examination after completed treatment for conditions other than malignant neoplasm: Secondary | ICD-10-CM

## 2023-05-27 DIAGNOSIS — Z940 Kidney transplant status: Secondary | ICD-10-CM | POA: Diagnosis not present

## 2023-05-27 NOTE — Assessment & Plan Note (Signed)
Patient is almost 2 months post kidney transplant.  Being followed by the [facility] transplant team.  Seems to be doing well.  Follow-up with transplant team as recommended

## 2023-05-27 NOTE — Assessment & Plan Note (Signed)
Patient recently had kidney transplant surgery.

## 2023-05-27 NOTE — Progress Notes (Signed)
   Established Patient Office Visit  Subjective   Patient ID: Eric Douglas, male    DOB: 03-10-1971  Age: 52 y.o. MRN: [id number]  Chief Complaint  Patient presents with   Follow-up    Pt states he feels good since surgery. Only a little tired.     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  Patient states he is urinating now throughout the day he is no longer on any type of dialysis.  He also was referred to [facility] cardiology and has a referral for [facility] cardiology EP coming up.  Patient states overall he feels well.  He anticipates to be able to go back to work in the next 2 months   Review of Systems  Constitutional:  Negative for chills and fever.  Respiratory:  Negative for shortness of breath.   Cardiovascular:  Negative for chest pain.  Gastrointestinal:  Negative for abdominal pain, nausea and vomiting.  Neurological:  Negative for headaches.      Objective:     BP 132/76   Pulse 71   Temp (!) 97.5 F (36.4 C) (Temporal)   Ht 6' 5.25" (1.962 m)   Wt 296 lb 9.6 oz (134.5 kg)   SpO2 98%   BMI 34.94 kg/m    Physical Exam Vitals and nursing note reviewed.  Constitutional:      Appearance: Normal appearance.  Cardiovascular:     Rate and Rhythm: Normal rate and regular rhythm.     Heart sounds: Murmur heard.  Pulmonary:     Effort: Pulmonary effort is normal.     Breath sounds: Normal breath sounds.  Abdominal:       Comments: Well healing scar and spot where the drain was.   Neurological:     Mental Status: He is alert.      No results found for any visits on 05/27/23.    The 10-year ASCVD risk score (Arnett DK, et al., 2019) is: 12.1%    Assessment & Plan:   Problem List Items Addressed This Visit        Cardiovascular and Mediastinum   Paroxysmal atrial fibrillation (HCC)    Patient in normal rate and rhythm per auscultation today.  Currently on amiodarone and apixaban.  Does have appointment with [facility] cardiology EP.  Continue follow-up with cardiology and EP as recommended      Relevant Medications   hydrALAZINE (APRESOLINE) 25 MG tablet   metoprolol tartrate (LOPRESSOR) 25 MG tablet     Other   Hospital discharge follow-up - Primary    Patient recently had kidney transplant surgery.      S/P kidney transplant    Patient is almost 2 months post kidney transplant.  Being followed by the [facility] transplant team.  Seems to be doing well.  Follow-up with transplant team as recommended      Relevant Medications   tacrolimus (PROGRAF) 1 MG capsule   predniSONE (DELTASONE) 5 MG tablet   mycophenolate (CELLCEPT) 250 MG capsule    Return in about 7 months (around 12/28/2023) for CPE and Labs.    Audria Nine, NP

## 2023-05-27 NOTE — Patient Instructions (Signed)
Nice to see you today Keep your appointments as scheduled with [facility] I do not need to see you until time for your physical. Approx 7 months, unless you need me before

## 2023-05-27 NOTE — Assessment & Plan Note (Signed)
Patient in normal rate and rhythm per auscultation today.  Currently on amiodarone and apixaban.  Does have appointment with [facility] cardiology EP.  Continue follow-up with cardiology and EP as recommended

## 2023-05-28 DIAGNOSIS — R109 Unspecified abdominal pain: Secondary | ICD-10-CM | POA: Diagnosis not present

## 2023-05-28 DIAGNOSIS — Z940 Kidney transplant status: Secondary | ICD-10-CM | POA: Diagnosis not present

## 2023-05-28 DIAGNOSIS — S37019A Minor contusion of unspecified kidney, initial encounter: Secondary | ICD-10-CM | POA: Diagnosis not present

## 2023-05-28 DIAGNOSIS — N99840 Postprocedural hematoma of a genitourinary system organ or structure following a genitourinary system procedure: Secondary | ICD-10-CM | POA: Diagnosis not present

## 2023-05-28 DIAGNOSIS — T8619 Other complication of kidney transplant: Secondary | ICD-10-CM | POA: Diagnosis not present

## 2023-06-03 DIAGNOSIS — D849 Immunodeficiency, unspecified: Secondary | ICD-10-CM | POA: Diagnosis not present

## 2023-06-03 DIAGNOSIS — Z940 Kidney transplant status: Secondary | ICD-10-CM | POA: Diagnosis not present

## 2023-06-18 DIAGNOSIS — D849 Immunodeficiency, unspecified: Secondary | ICD-10-CM | POA: Diagnosis not present

## 2023-06-18 DIAGNOSIS — Z940 Kidney transplant status: Secondary | ICD-10-CM | POA: Diagnosis not present

## 2023-07-02 DIAGNOSIS — Z940 Kidney transplant status: Secondary | ICD-10-CM | POA: Diagnosis not present

## 2023-07-02 DIAGNOSIS — D849 Immunodeficiency, unspecified: Secondary | ICD-10-CM | POA: Diagnosis not present

## 2023-07-05 DIAGNOSIS — I482 Chronic atrial fibrillation, unspecified: Secondary | ICD-10-CM | POA: Diagnosis not present

## 2023-07-05 DIAGNOSIS — I480 Paroxysmal atrial fibrillation: Secondary | ICD-10-CM | POA: Diagnosis not present

## 2023-07-05 DIAGNOSIS — Z23 Encounter for immunization: Secondary | ICD-10-CM | POA: Diagnosis not present

## 2023-07-05 DIAGNOSIS — I10 Essential (primary) hypertension: Secondary | ICD-10-CM | POA: Diagnosis not present

## 2023-07-11 DIAGNOSIS — Z7952 Long term (current) use of systemic steroids: Secondary | ICD-10-CM | POA: Diagnosis not present

## 2023-07-11 DIAGNOSIS — Z2989 Encounter for other specified prophylactic measures: Secondary | ICD-10-CM | POA: Diagnosis not present

## 2023-07-11 DIAGNOSIS — Z940 Kidney transplant status: Secondary | ICD-10-CM | POA: Diagnosis not present

## 2023-07-11 DIAGNOSIS — D638 Anemia in other chronic diseases classified elsewhere: Secondary | ICD-10-CM | POA: Diagnosis not present

## 2023-07-11 DIAGNOSIS — Z79624 Long term (current) use of inhibitors of nucleotide synthesis: Secondary | ICD-10-CM | POA: Diagnosis not present

## 2023-07-11 DIAGNOSIS — D84821 Immunodeficiency due to drugs: Secondary | ICD-10-CM | POA: Diagnosis not present

## 2023-07-11 DIAGNOSIS — Z5181 Encounter for therapeutic drug level monitoring: Secondary | ICD-10-CM | POA: Diagnosis not present

## 2023-07-11 DIAGNOSIS — Z792 Long term (current) use of antibiotics: Secondary | ICD-10-CM | POA: Diagnosis not present

## 2023-07-11 DIAGNOSIS — Z4822 Encounter for aftercare following kidney transplant: Secondary | ICD-10-CM | POA: Diagnosis not present

## 2023-07-11 DIAGNOSIS — G44011 Episodic cluster headache, intractable: Secondary | ICD-10-CM | POA: Diagnosis not present

## 2023-07-11 DIAGNOSIS — G44009 Cluster headache syndrome, unspecified, not intractable: Secondary | ICD-10-CM | POA: Diagnosis not present

## 2023-07-11 DIAGNOSIS — T8619 Other complication of kidney transplant: Secondary | ICD-10-CM | POA: Diagnosis not present

## 2023-07-11 DIAGNOSIS — Z79621 Long term (current) use of calcineurin inhibitor: Secondary | ICD-10-CM | POA: Diagnosis not present

## 2023-07-11 DIAGNOSIS — D849 Immunodeficiency, unspecified: Secondary | ICD-10-CM | POA: Diagnosis not present

## 2023-07-11 DIAGNOSIS — Z79899 Other long term (current) drug therapy: Secondary | ICD-10-CM | POA: Diagnosis not present

## 2023-07-11 DIAGNOSIS — I480 Paroxysmal atrial fibrillation: Secondary | ICD-10-CM | POA: Diagnosis not present

## 2023-07-16 DIAGNOSIS — I482 Chronic atrial fibrillation, unspecified: Secondary | ICD-10-CM | POA: Diagnosis not present

## 2023-07-16 HISTORY — PX: OTHER SURGICAL HISTORY: SHX169

## 2023-07-25 ENCOUNTER — Telehealth (HOSPITAL_COMMUNITY): Payer: Self-pay | Admitting: Physician Assistant

## 2023-07-25 NOTE — Telephone Encounter (Signed)
Called 07/25/23 and stated he wanted to cancel his appt, said that he had a kidney transplant at [facility], and he being seeing a cardiologist there, and that he was going to stick with them.

## 2023-08-01 DIAGNOSIS — Z940 Kidney transplant status: Secondary | ICD-10-CM | POA: Diagnosis not present

## 2023-08-01 DIAGNOSIS — D849 Immunodeficiency, unspecified: Secondary | ICD-10-CM | POA: Diagnosis not present

## 2023-08-06 DIAGNOSIS — I480 Paroxysmal atrial fibrillation: Secondary | ICD-10-CM | POA: Diagnosis not present

## 2023-08-06 DIAGNOSIS — I4892 Unspecified atrial flutter: Secondary | ICD-10-CM | POA: Diagnosis not present

## 2023-08-08 ENCOUNTER — Ambulatory Visit (HOSPITAL_COMMUNITY): Payer: 59 | Admitting: Physician Assistant

## 2023-08-12 ENCOUNTER — Ambulatory Visit (INDEPENDENT_AMBULATORY_CARE_PROVIDER_SITE_OTHER): Payer: Medicare Other

## 2023-08-12 VITALS — Ht 77.0 in | Wt 296.0 lb

## 2023-08-12 DIAGNOSIS — Z0000 Encounter for general adult medical examination without abnormal findings: Secondary | ICD-10-CM | POA: Diagnosis not present

## 2023-08-12 NOTE — Patient Instructions (Signed)
Eric Douglas , Thank you for taking time to come for your Medicare Wellness Visit. I appreciate your ongoing commitment to your health goals. Please review the following plan we discussed and let me know if I can assist you in the future.   Referrals/Orders/Follow-Ups/Clinician Recommendations: none  This is a list of the screening recommended for you and due dates:  Health Maintenance  Topic Date Due   Hepatitis C Screening  Never done   COVID-19 Vaccine (3 - Pfizer risk series) 08/28/2023*   Flu Shot  01/13/2024*   Medicare Annual Wellness Visit  08/11/2024   Colon Cancer Screening  07/01/2027   DTaP/Tdap/Td vaccine (4 - Td or Tdap) 12/03/2028   HIV Screening  Completed   Zoster (Shingles) Vaccine  Completed   HPV Vaccine  Aged Out  *Topic was postponed. The date shown is not the original due date.    Advanced directives: (Copy Requested) Please bring a copy of your health care power of attorney and living will to the office to be added to your chart at your convenience.  Next Medicare Annual Wellness Visit scheduled for next year: Yes10/30/25 @ 3pm telephone

## 2023-08-12 NOTE — Progress Notes (Signed)
Subjective:   Eric Douglas is a 52 y.o. male who presents for Medicare Annual/Subsequent preventive examination.  Visit Complete: Virtual I connected with  Eric Douglas on 08/12/23 by a audio enabled telemedicine application and verified that I am speaking with the correct person using two identifiers.  Patient Location: Home  Provider Location: Office/Clinic  I discussed the limitations of evaluation and management by telemedicine. The patient expressed understanding and agreed to proceed.  Vital Signs: Because this visit was a virtual/telehealth visit, some criteria may be missing or patient reported. Any vitals not documented were not able to be obtained and vitals that have been documented are patient reported.  Patient Medicare AWV questionnaire was completed by the patient on (not done); I have confirmed that all information answered by patient is correct and no changes since this date. Cardiac Risk Factors include: advanced age (>58men, >40 women);hypertension;male gender;obesity (BMI >30kg/m2)    Objective:    Today's Vitals   08/12/23 1426  Weight: 296 lb (134.3 kg)  Height: 6\' 5"  (1.956 m)   Body mass index is 35.1 kg/m.     08/12/2023    2:34 PM 08/08/2022    9:35 AM 12/21/2021   10:15 AM 10/22/2021    1:05 PM 02/28/2020    9:59 AM 10/27/2019   10:01 AM 06/14/2019    9:02 AM  Advanced Directives  Does Patient Have a Medical Advance Directive? Yes No No No No No No  Type of Media planner of Healthcare Power of Attorney in Chart? No - copy requested        Would patient like information on creating a medical advance directive?  No - Patient declined Yes (MAU/Ambulatory/Procedural Areas - Information given)    No - Guardian declined    Current Medications (verified) Outpatient Encounter Medications as of 08/12/2023  Medication Sig   acetaminophen (TYLENOL) 325 MG tablet Take by mouth.   allopurinol  (ZYLOPRIM) 100 MG tablet Take 100 mg by mouth every morning.   amLODipine (NORVASC) 2.5 MG tablet TAKE 1 TABLET BY MOUTH EVERYDAY AT BEDTIME   apixaban (ELIQUIS) 5 MG TABS tablet Take 1 tablet (5 mg total) by mouth 2 (two) times daily.   calcitRIOL (ROCALTROL) 0.25 MCG capsule Take 0.5 mcg by mouth every morning.   calcium acetate (PHOSLO) 667 MG tablet Taking 3-8 tablets by mouth daily with meals and snacks   Calcium Polycarbophil (FIBERCON PO) Take 2 tablets by mouth every morning.   cinacalcet (SENSIPAR) 30 MG tablet Take 30 mg by mouth daily.   eszopiclone (LUNESTA) 2 MG TABS tablet Take 1 tablet (2 mg total) by mouth at bedtime. Take immediately before bedtime   famotidine (PEPCID) 20 MG tablet Take 1 tablet by mouth at bedtime.   gabapentin (NEURONTIN) 300 MG capsule Take 300 mg by mouth at bedtime.   hydrALAZINE (APRESOLINE) 25 MG tablet Take 25 mg by mouth 2 (two) times daily.   lactulose (CHRONULAC) 10 GM/15ML solution Take by mouth as needed.   Methoxy PEG-Epoetin Beta (MIRCERA IJ) Inject into the skin every other day.   metoprolol tartrate (LOPRESSOR) 25 MG tablet Take 25 mg by mouth 2 (two) times daily.   mycophenolate (CELLCEPT) 250 MG capsule Take by mouth.   pantoprazole (PROTONIX) 20 MG tablet Take by mouth.   polyethylene glycol powder (GLYCOLAX/MIRALAX) 17 GM/SCOOP powder Take by mouth.   predniSONE (DELTASONE) 5 MG tablet Take by mouth.  senna-docusate (SENOKOT-S) 8.6-50 MG tablet Take by mouth.   sucroferric oxyhydroxide (VELPHORO) 500 MG chewable tablet Chew 500-1,000 mg by mouth See admin instructions. Chew 2 tablets (1000 mg) by mouth once or twice daily with meals, 500 mg with snacks   sulfamethoxazole-trimethoprim (BACTRIM DS) 800-160 MG tablet Take by mouth.   tacrolimus (PROGRAF) 1 MG capsule Take by mouth.   tamsulosin (FLOMAX) 0.4 MG CAPS capsule Take 0.4 mg by mouth daily.   testosterone cypionate (DEPOTESTOSTERONE CYPIONATE) 200 MG/ML injection SMARTSIG:0.5  Milliliter(s) IM Once a Week   valGANciclovir (VALCYTE) 450 MG tablet Take by mouth.   amiodarone (PACERONE) 200 MG tablet TAKE 1 TABLET BY MOUTH EVERY DAY (Patient not taking: Reported on 08/12/2023)   No facility-administered encounter medications on file as of 08/12/2023.    Allergies (verified) Patient has no known allergies.   History: Past Medical History:  Diagnosis Date   Fibula fracture    ORIF 1990   Gout    non crystal proven    History of chickenpox    History of kidney disease    HTN (hypertension)    Nephropathy    Ig A; dx 2002. 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   stab phlebectomy Left 12/17/2016   stab phlebectomy >20 incisions (left leg) by Josephina Gip MD    Family History  Problem Relation Age of Onset   Cancer Paternal Grandfather 65       brain cancer   Hypertension Other        GP   Stroke Other    Diabetes Other        DM - GP    Heart disease Neg Hx    Social History   Socioeconomic History   Marital status: Divorced    Spouse name: Not on file   Number of children: 2   Years of education: Not on file   Highest education level: Not on file  Occupational History   Occupation: Service Tech    Comment: [place]  Tobacco Use   Smoking status: Never   Smokeless tobacco: Never   Tobacco comments:    Never smoke 12/28/21  Vaping Use   Vaping status: Never Used  Substance and  Sexual Activity   Alcohol use: No    Alcohol/week: 0.0 standard drinks of alcohol   Drug use: No   Sexual activity: Yes  Other Topics Concern   Not on file  Social History Narrative   Divorced, primary custody of son; [place]; gets regular exercise.       Pt signed designated party release and gives no access to medical records. Can leave msg on cell [phone].    Social Determinants of Health   Financial Resource Strain: Low Risk  (08/12/2023)   Overall Financial Resource Strain (CARDIA)    Difficulty of Paying Living Expenses: Not hard at all  Food Insecurity: No Food Insecurity (08/12/2023)  Hunger Vital Sign    Worried About Running Out of Food in the Last Year: Never true    Ran Out of Food in the Last Year: Never true  Transportation Needs: No Transportation Needs (08/12/2023)   PRAPARE - Administrator, Civil Service (Medical): No    Lack of Transportation (Non-Medical): No  Physical Activity: Inactive (08/12/2023)   Exercise Vital Sign    Days of Exercise per Week: 0 days    Minutes of Exercise per Session: 0 min  Stress: No Stress Concern Present (08/12/2023)   [employer] of Occupational Health - Occupational Stress Questionnaire    Feeling of Stress : Not at all  Social Connections: Moderately Isolated (08/12/2023)   Social Connection and Isolation Panel [NHANES]    Frequency of Communication with Friends and Family: More than three times a week    Frequency of Social Gatherings with Friends and Family: Three times a week    Attends Religious Services: More than 4 times per year    Active Member of Clubs or Organizations: No    Attends Banker Meetings: Never    Marital Status: Divorced    Tobacco Counseling Counseling given: Not Answered Tobacco comments: Never smoke 12/28/21   Clinical Intake:  Pre-visit preparation completed: No  Pain : No/denies pain     BMI - recorded: 35.1 Nutritional Status: BMI > 30   Obese Nutritional Risks: None Diabetes: No  How often do you need to have someone help you when you read instructions, pamphlets, or other written materials from your doctor or pharmacy?: 1 - Never  Interpreter Needed?: No  Comments: two sons live with pt Information entered by :: B.Kathryne Ramella,LPN   Activities of Daily Living    08/12/2023    2:35 PM  In your present state of health, do you have any difficulty performing the following activities:  Hearing? 0  Vision? 0  Difficulty concentrating or making decisions? 0  Walking or climbing stairs? 0  Dressing or bathing? 0  Doing errands, shopping? 0  Preparing Food and eating ? N  Using the Toilet? N  In the past six months, have you accidently leaked urine? N  Do you have problems with loss of bowel control? N  Managing your Medications? N  Managing your Finances? N  Housekeeping or managing your Housekeeping? N    Patient Care Team: Eden Emms, NP as PCP - General (Pain Medicine) Annie Sable, MD as Consulting Physician (Nephrology) Kathyrn Sheriff, [facility] (Inactive) as Pharmacist (Pharmacist)  Indicate any recent Medical Services you may have received from other than [facility] providers in the past year (date may be approximate).     Assessment:   This is a routine wellness examination for Eric Douglas.  Hearing/Vision screen Hearing Screening - Comments:: Pt says his hearing is good Vision Screening - Comments:: Pt says he can see well;readers only Dr Hazle Quant   Goals Addressed             This Visit's Progress    COMPLETED: DIET - EAT MORE FRUITS AND VEGETABLES   On track    Patient Stated       Exercise 3 days :walking,etc     COMPLETED: Track and Manage My Blood Pressure-Hypertension   On track    Timeframe:  Long-Range Goal Priority:  High Start Date:        11/24/21  Expected End Date:    11/24/22                   Follow Up Date August 2023   - check blood pressure  daily - choose a place to take my blood pressure (home, clinic or office, retail store) - write blood pressure results in a log or diary    Why is this important?   You won't feel high blood pressure, but it can still hurt your blood vessels.  High blood pressure can cause heart or kidney problems. It can also cause a stroke.  Making lifestyle changes like losing a little weight or eating less salt will help.  Checking your blood pressure at home and at different times of the day can help to control blood pressure.  If the doctor prescribes medicine remember to take it the way the doctor ordered.  Call the office if you cannot afford the medicine or if there are questions about it.     Notes:        Depression Screen    08/12/2023    2:31 PM 05/27/2023    9:08 AM 12/27/2022    8:02 AM 11/23/2022    8:35 AM 08/08/2022    9:34 AM 06/08/2021    8:11 AM 07/24/2019    3:20 PM  PHQ 2/9 Scores  PHQ - 2 Score 0 0 0 0 0 0 0  PHQ- 9 Score  3 4 4  0      Fall Risk    08/12/2023    2:28 PM 05/27/2023    9:09 AM 12/27/2022    8:02 AM 11/23/2022    8:35 AM 08/08/2022    9:36 AM  Fall Risk   Falls in the past year? 0 0 0 0 0  Number falls in past yr: 0 0 0 0 0  Injury with Fall? 0 0 0 0 0  Risk for fall due to : No Fall Risks No Fall Risks No Fall Risks No Fall Risks No Fall Risks  Follow up Education provided;Falls prevention discussed Falls evaluation completed Falls evaluation completed Falls evaluation completed Falls prevention discussed;Falls evaluation completed    MEDICARE RISK AT HOME: Medicare Risk at Home Any stairs in or around the home?: Yes If so, are there any without handrails?: Yes Home free of loose throw rugs in walkways, pet beds, electrical cords, etc?: Yes Adequate lighting in your home to reduce risk of falls?: Yes Life alert?: No Use of a cane, walker or w/c?: No Grab bars in the bathroom?: Yes Shower chair or bench in shower?: No Elevated toilet seat or a  handicapped toilet?: No  TIMED UP AND GO:  Was the test performed?  No    Cognitive Function:        08/12/2023    2:39 PM 08/08/2022    9:36 AM  6CIT Screen  What Year? 0 points 0 points  What month? 0 points 0 points  What time? 0 points 0 points  Count back from 20 0 points 0 points  Months in reverse 0 points 0 points  Repeat phrase 0 points 0 points  Total Score 0 points 0 points    Immunizations Immunization History  Administered Date(s) Administered   Hepatitis B, ADULT 01/03/2018, 03/10/2018, 04/09/2018, 06/18/2018, 11/13/2019, 12/14/2019, 09/13/2022   Hepb-cpg 05/24/2022, 07/11/2022, 10/03/2022   Influenza, Quadrivalent, Recombinant, Inj, Pf 09/13/2022   Influenza,inj,Quad PF,6+ Mos 07/24/2019, 07/20/2021   Influenza-Unspecified 06/15/2018, 07/24/2019   PFIZER(Purple Top)SARS-COV-2 Vaccination  12/28/2019, 01/19/2020   PNEUMOCOCCAL CONJUGATE-20 02/27/2023   Pneumococcal Conjugate-13 12/19/2017   Td 12/02/2008   Td (Adult),5 Lf Tetanus Toxid, Preservative Free 12/02/2008   Tdap 12/03/2018   Zoster Recombinant(Shingrix) 12/27/2022, 04/02/2023    TDAP status: Up to date  Flu Vaccine status: Up to date [facility] September  Covid-19 vaccine status: Completed vaccines  Qualifies for Shingles Vaccine? Yes   Zostavax completed Yes   Shingrix Completed?: Yes  Screening Tests Health Maintenance  Topic Date Due   Hepatitis C Screening  Never done   COVID-19 Vaccine (3 - Pfizer risk series) 08/28/2023 (Originally 02/16/2020)   INFLUENZA VACCINE  01/13/2024 (Originally 05/16/2023)   Medicare Annual Wellness (AWV)  08/11/2024   Colonoscopy  07/01/2027   DTaP/Tdap/Td (4 - Td or Tdap) 12/03/2028   HIV Screening  Completed   Zoster Vaccines- Shingrix  Completed   HPV VACCINES  Aged Out    Health Maintenance  Health Maintenance Due  Topic Date Due   Hepatitis C Screening  Never done    Colorectal cancer screening: Type of screening: Colonoscopy. Completed 06/30/22.  Repeat every 5 years  Lung Cancer Screening: (Low Dose CT Chest recommended if Age 9-80 years, 20 pack-year currently smoking OR have quit w/in 15years.) does not qualify.   Lung Cancer Screening Referral: no  Additional Screening:  Hepatitis C Screening: does not qualify; Completed no  Vision Screening: Recommended annual ophthalmology exams for early detection of glaucoma and other disorders of the eye. Is the patient up to date with their annual eye exam?  Yes  Who is the provider or what is the name of the office in which the patient attends annual eye exams? Dr Hazle Quant If pt is not established with a provider, would they like to be referred to a provider to establish care? No .   Dental Screening: Recommended annual dental exams for proper oral hygiene  Diabetic Foot Exam: n/a  Community Resource Referral / Chronic Care Management: CRR required this visit?  No   CCM required this visit?  No     Plan:     I have personally reviewed and noted the following in the patient's chart:   Medical and social history Use of alcohol, tobacco or illicit drugs  Current medications and supplements including opioid prescriptions. Patient is not currently taking opioid prescriptions. Functional ability and status Nutritional status Physical activity Advanced directives List of other physicians Hospitalizations, surgeries, and ER visits in previous 12 months Vitals Screenings to include cognitive, depression, and falls Referrals and appointments  In addition, I have reviewed and discussed with patient certain preventive protocols, quality metrics, and best practice recommendations. A written personalized care plan for preventive services as well as general preventive health recommendations were provided to patient.    Sue Lush, LPN   52/84/1324   After Visit Summary: (MyChart) Due to this being a telephonic visit, the after visit summary with patients personalized plan was  offered to patient via MyChart   Nurse Notes: The patient states he is doing well and has no concerns or questions at this time.

## 2023-08-19 DIAGNOSIS — H8101 Meniere's disease, right ear: Secondary | ICD-10-CM | POA: Diagnosis not present

## 2023-08-20 DIAGNOSIS — I482 Chronic atrial fibrillation, unspecified: Secondary | ICD-10-CM | POA: Diagnosis not present

## 2023-08-21 DIAGNOSIS — I480 Paroxysmal atrial fibrillation: Secondary | ICD-10-CM | POA: Diagnosis not present

## 2023-08-21 DIAGNOSIS — I10 Essential (primary) hypertension: Secondary | ICD-10-CM | POA: Diagnosis not present

## 2023-08-22 DIAGNOSIS — Z79621 Long term (current) use of calcineurin inhibitor: Secondary | ICD-10-CM | POA: Diagnosis not present

## 2023-08-22 DIAGNOSIS — Z7901 Long term (current) use of anticoagulants: Secondary | ICD-10-CM | POA: Diagnosis not present

## 2023-08-22 DIAGNOSIS — Z940 Kidney transplant status: Secondary | ICD-10-CM | POA: Diagnosis not present

## 2023-08-22 DIAGNOSIS — D702 Other drug-induced agranulocytosis: Secondary | ICD-10-CM | POA: Diagnosis not present

## 2023-08-22 DIAGNOSIS — R519 Headache, unspecified: Secondary | ICD-10-CM | POA: Diagnosis not present

## 2023-08-22 DIAGNOSIS — Z5181 Encounter for therapeutic drug level monitoring: Secondary | ICD-10-CM | POA: Diagnosis not present

## 2023-08-22 DIAGNOSIS — D849 Immunodeficiency, unspecified: Secondary | ICD-10-CM | POA: Diagnosis not present

## 2023-08-22 DIAGNOSIS — D84821 Immunodeficiency due to drugs: Secondary | ICD-10-CM | POA: Diagnosis not present

## 2023-08-22 DIAGNOSIS — Z23 Encounter for immunization: Secondary | ICD-10-CM | POA: Diagnosis not present

## 2023-08-22 DIAGNOSIS — Z9889 Other specified postprocedural states: Secondary | ICD-10-CM | POA: Diagnosis not present

## 2023-08-22 DIAGNOSIS — E213 Hyperparathyroidism, unspecified: Secondary | ICD-10-CM | POA: Diagnosis not present

## 2023-08-22 DIAGNOSIS — Z79624 Long term (current) use of inhibitors of nucleotide synthesis: Secondary | ICD-10-CM | POA: Diagnosis not present

## 2023-08-22 DIAGNOSIS — Z4822 Encounter for aftercare following kidney transplant: Secondary | ICD-10-CM | POA: Diagnosis not present

## 2023-08-22 DIAGNOSIS — I10 Essential (primary) hypertension: Secondary | ICD-10-CM | POA: Diagnosis not present

## 2023-08-22 DIAGNOSIS — Z79899 Other long term (current) drug therapy: Secondary | ICD-10-CM | POA: Diagnosis not present

## 2023-08-22 DIAGNOSIS — I480 Paroxysmal atrial fibrillation: Secondary | ICD-10-CM | POA: Diagnosis not present

## 2023-08-24 ENCOUNTER — Other Ambulatory Visit (HOSPITAL_COMMUNITY): Payer: Self-pay | Admitting: Physician Assistant

## 2023-08-27 ENCOUNTER — Encounter: Payer: Self-pay | Admitting: Nurse Practitioner

## 2023-08-27 DIAGNOSIS — N189 Chronic kidney disease, unspecified: Secondary | ICD-10-CM | POA: Diagnosis not present

## 2023-08-27 DIAGNOSIS — D638 Anemia in other chronic diseases classified elsewhere: Secondary | ICD-10-CM | POA: Diagnosis not present

## 2023-08-27 DIAGNOSIS — Z940 Kidney transplant status: Secondary | ICD-10-CM | POA: Diagnosis not present

## 2023-08-27 DIAGNOSIS — I480 Paroxysmal atrial fibrillation: Secondary | ICD-10-CM | POA: Diagnosis not present

## 2023-08-27 DIAGNOSIS — I10 Essential (primary) hypertension: Secondary | ICD-10-CM | POA: Diagnosis not present

## 2023-08-27 DIAGNOSIS — D849 Immunodeficiency, unspecified: Secondary | ICD-10-CM | POA: Diagnosis not present

## 2023-09-02 DIAGNOSIS — E291 Testicular hypofunction: Secondary | ICD-10-CM | POA: Diagnosis not present

## 2023-09-02 DIAGNOSIS — R948 Abnormal results of function studies of other organs and systems: Secondary | ICD-10-CM | POA: Diagnosis not present

## 2023-09-05 DIAGNOSIS — Z940 Kidney transplant status: Secondary | ICD-10-CM | POA: Diagnosis not present

## 2023-09-05 DIAGNOSIS — D849 Immunodeficiency, unspecified: Secondary | ICD-10-CM | POA: Diagnosis not present

## 2023-09-09 DIAGNOSIS — E291 Testicular hypofunction: Secondary | ICD-10-CM | POA: Diagnosis not present

## 2023-09-16 DIAGNOSIS — Z940 Kidney transplant status: Secondary | ICD-10-CM | POA: Diagnosis not present

## 2023-09-16 DIAGNOSIS — D849 Immunodeficiency, unspecified: Secondary | ICD-10-CM | POA: Diagnosis not present

## 2023-09-17 ENCOUNTER — Other Ambulatory Visit: Payer: Self-pay | Admitting: Pulmonary Disease

## 2023-09-17 NOTE — Telephone Encounter (Signed)
Last refill 10/31, last oV 11/15/22

## 2023-10-03 DIAGNOSIS — Z940 Kidney transplant status: Secondary | ICD-10-CM | POA: Diagnosis not present

## 2023-10-03 DIAGNOSIS — D849 Immunodeficiency, unspecified: Secondary | ICD-10-CM | POA: Diagnosis not present

## 2023-10-17 DIAGNOSIS — Z792 Long term (current) use of antibiotics: Secondary | ICD-10-CM | POA: Diagnosis not present

## 2023-10-17 DIAGNOSIS — Z87448 Personal history of other diseases of urinary system: Secondary | ICD-10-CM | POA: Diagnosis not present

## 2023-10-17 DIAGNOSIS — Z6837 Body mass index (BMI) 37.0-37.9, adult: Secondary | ICD-10-CM | POA: Diagnosis not present

## 2023-10-17 DIAGNOSIS — Z940 Kidney transplant status: Secondary | ICD-10-CM | POA: Diagnosis not present

## 2023-10-17 DIAGNOSIS — D849 Immunodeficiency, unspecified: Secondary | ICD-10-CM | POA: Diagnosis not present

## 2023-10-17 DIAGNOSIS — R300 Dysuria: Secondary | ICD-10-CM | POA: Diagnosis not present

## 2023-10-17 DIAGNOSIS — T8619 Other complication of kidney transplant: Secondary | ICD-10-CM | POA: Diagnosis not present

## 2023-10-21 DIAGNOSIS — H832X1 Labyrinthine dysfunction, right ear: Secondary | ICD-10-CM | POA: Diagnosis not present

## 2023-10-21 DIAGNOSIS — H8101 Meniere's disease, right ear: Secondary | ICD-10-CM | POA: Diagnosis not present

## 2023-10-21 DIAGNOSIS — H903 Sensorineural hearing loss, bilateral: Secondary | ICD-10-CM | POA: Diagnosis not present

## 2023-10-23 DIAGNOSIS — I480 Paroxysmal atrial fibrillation: Secondary | ICD-10-CM | POA: Diagnosis not present

## 2023-10-31 DIAGNOSIS — Z940 Kidney transplant status: Secondary | ICD-10-CM | POA: Diagnosis not present

## 2023-10-31 DIAGNOSIS — D849 Immunodeficiency, unspecified: Secondary | ICD-10-CM | POA: Diagnosis not present

## 2023-11-17 ENCOUNTER — Other Ambulatory Visit: Payer: Self-pay | Admitting: Pulmonary Disease

## 2023-11-20 DIAGNOSIS — Z940 Kidney transplant status: Secondary | ICD-10-CM | POA: Diagnosis not present

## 2023-11-20 DIAGNOSIS — D849 Immunodeficiency, unspecified: Secondary | ICD-10-CM | POA: Diagnosis not present

## 2023-11-29 ENCOUNTER — Telehealth: Payer: Self-pay

## 2023-11-29 DIAGNOSIS — I340 Nonrheumatic mitral (valve) insufficiency: Secondary | ICD-10-CM | POA: Diagnosis not present

## 2023-11-29 DIAGNOSIS — Z6837 Body mass index (BMI) 37.0-37.9, adult: Secondary | ICD-10-CM | POA: Diagnosis not present

## 2023-11-29 DIAGNOSIS — E66812 Obesity, class 2: Secondary | ICD-10-CM | POA: Diagnosis not present

## 2023-11-29 DIAGNOSIS — I480 Paroxysmal atrial fibrillation: Secondary | ICD-10-CM | POA: Diagnosis not present

## 2023-11-29 DIAGNOSIS — I10 Essential (primary) hypertension: Secondary | ICD-10-CM | POA: Diagnosis not present

## 2023-11-29 NOTE — Telephone Encounter (Signed)
Copied from CRM [id number]. Topic: Clinical - Medication Question >> Nov 29, 2023 12:03 PM Kathryne Eriksson wrote: Reason for CRM: [facility] >> Nov 29, 2023 12:05 PM Kathryne Eriksson wrote: Patient called in stating he was last seen over at [facility] and they're wanting him to speak with Eden Emms, NP about being put on Ozempic. Patient's call back number is [phone]

## 2023-12-02 DIAGNOSIS — I10 Essential (primary) hypertension: Secondary | ICD-10-CM | POA: Diagnosis not present

## 2023-12-02 DIAGNOSIS — I480 Paroxysmal atrial fibrillation: Secondary | ICD-10-CM | POA: Diagnosis not present

## 2023-12-02 NOTE — Telephone Encounter (Signed)
 Can we call the office and see what there recommendations are. If the provider would like to speak to me please come and get me

## 2023-12-02 NOTE — Telephone Encounter (Signed)
 Contacted [facility].  Left voicemail for Dr. Coralee Pesa Nurse to return my call.

## 2023-12-03 ENCOUNTER — Telehealth: Payer: Self-pay

## 2023-12-03 NOTE — Telephone Encounter (Signed)
 duplicate

## 2023-12-03 NOTE — Telephone Encounter (Signed)
 Returned [employer] to speak with Larita Fife, Dr. Yetta Barre nurse. Was unable to reach her, call must have dropped. Left detailed message for Dr.Jones or lynn to reach out to matt personally or give [place] a call back with recommendations.

## 2023-12-03 NOTE — Telephone Encounter (Signed)
 Renee from [employer] called in returning a call she received.

## 2023-12-03 NOTE — Telephone Encounter (Signed)
 Copied from CRM [id number]. Topic: General - Other >> Dec 03, 2023  3:15 PM [place] J wrote: Reason for CRM: [employer] associate returning a call back from [employer]. Associate said to contact the transplant team regarding the ozempic recommendations.

## 2023-12-03 NOTE — Telephone Encounter (Signed)
 Contacted [facility] [employer]. Spoke with Renee who transferred me to the correct department.   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  Left voicemail for Dr. Yetta Barre nurse to return my call regarding recommendations for ozempic.

## 2023-12-03 NOTE — Telephone Encounter (Signed)
 Contacted Transplant team to speak with [employer] No answer.  Left VM for patient to call office back regarding recommendations for ozempic.

## 2023-12-04 ENCOUNTER — Telehealth: Payer: Self-pay

## 2023-12-04 NOTE — Telephone Encounter (Signed)
 Copied from CRM [id number]. Topic: General - Other >> Dec 04, 2023 10:58 AM Fredrich Romans wrote: Reason for CRM: Crystal from [facility] ,kidney transplant coordinator called stating that they are okay with patient being starting on ozempic. Cb#:[phone]

## 2023-12-04 NOTE — Telephone Encounter (Signed)
 Can we reach out to the patient and see if he knows why they want him on ozempic and we can schedule hi a virtual visit

## 2023-12-06 ENCOUNTER — Telehealth: Payer: Self-pay

## 2023-12-06 NOTE — Telephone Encounter (Signed)
 Left voicemail for Crystal kidney transplant coordinator  to call the office back.

## 2023-12-06 NOTE — Telephone Encounter (Signed)
 Copied from CRM [id number]. Topic: General - Other >> Dec 06, 2023 10:29 AM Elizebeth Brooking wrote: Reason for CRM: Kidney Transplant Coordinator called in wanting to speak with [facility] regarding missed call, is requesting a callback

## 2023-12-09 ENCOUNTER — Telehealth: Payer: Self-pay

## 2023-12-09 DIAGNOSIS — G44011 Episodic cluster headache, intractable: Secondary | ICD-10-CM | POA: Diagnosis not present

## 2023-12-09 DIAGNOSIS — Z940 Kidney transplant status: Secondary | ICD-10-CM | POA: Diagnosis not present

## 2023-12-09 DIAGNOSIS — Z48298 Encounter for aftercare following other organ transplant: Secondary | ICD-10-CM | POA: Diagnosis not present

## 2023-12-09 DIAGNOSIS — I151 Hypertension secondary to other renal disorders: Secondary | ICD-10-CM | POA: Diagnosis not present

## 2023-12-09 DIAGNOSIS — D849 Immunodeficiency, unspecified: Secondary | ICD-10-CM | POA: Diagnosis not present

## 2023-12-09 DIAGNOSIS — N401 Enlarged prostate with lower urinary tract symptoms: Secondary | ICD-10-CM | POA: Diagnosis not present

## 2023-12-09 DIAGNOSIS — T8619 Other complication of kidney transplant: Secondary | ICD-10-CM | POA: Diagnosis not present

## 2023-12-09 DIAGNOSIS — B259 Cytomegaloviral disease, unspecified: Secondary | ICD-10-CM | POA: Diagnosis not present

## 2023-12-09 NOTE — Telephone Encounter (Signed)
 Copied from CRM [id number]. Topic: General - Other >> Dec 09, 2023 10:58 AM Denese Killings wrote: Reason for CRM: Patient is checking on the status of being placed on Ozempic. He is requesting a callback.

## 2023-12-09 NOTE — Telephone Encounter (Signed)
 Called and spoke with patient, he believes they want to start him on ozempic to keep his weight and blood sugars under control. Patient notes he has gained a bit from his kidney transplant. Scheduled virtual visit 02/28 to discuss. Patient has CPE scheduled 03/18.

## 2023-12-09 NOTE — Telephone Encounter (Signed)
See other phone encounter for further documentation. 

## 2023-12-13 ENCOUNTER — Telehealth (INDEPENDENT_AMBULATORY_CARE_PROVIDER_SITE_OTHER): Payer: Medicare Other | Admitting: Nurse Practitioner

## 2023-12-13 VITALS — Ht 77.0 in | Wt 329.0 lb

## 2023-12-13 DIAGNOSIS — E669 Obesity, unspecified: Secondary | ICD-10-CM | POA: Diagnosis not present

## 2023-12-13 MED ORDER — WEGOVY 0.25 MG/0.5ML ~~LOC~~ SOAJ: 0.2500 mg | SUBCUTANEOUS | 0 refills | Status: DC

## 2023-12-13 NOTE — Progress Notes (Signed)
 Ph: [phone] Fax: [phone]   Patient ID: Eric Douglas, male    DOB: Feb 08, 1971, 53 y.o.   MRN: [id number]  Virtual visit completed through MyChart, a video enabled telemedicine application. Due to national recommendations of social distancing due to COVID-19, a virtual visit is felt to be most appropriate for this patient at this time. Reviewed limitations, risks, security and privacy concerns of performing a virtual visit and the availability of in person appointments. I also reviewed that there may be a patient responsible charge related to this service. The patient agreed to proceed.   Patient location: home Provider location: [place], office Persons participating in this virtual visit: patient, provider   If any vitals were documented, they were collected by patient at home unless specified below.    Ht 6\' 5"  (1.956 m)   Wt (!) 329 lb (149.2 kg) Comment: per patient.  BMI 39.01 kg/m    CC: obesity Subjective:   HPI: Eric Douglas is a 53 y.o. male presenting on 12/13/2023 for Medication Management (Pt would like to discuss starting Ozempic. Kidney transplant team recommended pt start on ozempic with PCP. )    Obesity: states that he is being followed by cardiology and transplant team. Per his report they recommend that he try a GLP1 to aide in weight loss  He is eating 3 meals a day normally. He does also snack more especially since he is at work He does not have coffee but does drink a lot of water. He will do unsweet tea  and diet sundrop.  He works for [employer]. He does have a smart watch and will walk 2.5-3 miles a day at work.  He mentions that the most recent fasting glucose was 121. He is likely prediabetic. Last A1C was 2 years ago and normal     Relevant past medical, surgical, family and social history reviewed and updated as indicated. Interim medical history since our last visit reviewed. Allergies and  medications reviewed and updated. Outpatient Medications Prior to Visit  Medication Sig Dispense Refill   acetaminophen (TYLENOL) 325 MG tablet Take by mouth.     allopurinol (ZYLOPRIM) 100 MG tablet Take 100 mg by mouth every morning.  2   amLODipine (NORVASC) 2.5 MG tablet 7.5 mg.     apixaban (ELIQUIS) 5 MG TABS tablet Take 1 tablet (5 mg total) by mouth 2 (two) times daily. 180 tablet 1   calcitRIOL (ROCALTROL) 0.25 MCG capsule Take 0.5 mcg by mouth every morning.     calcium acetate (PHOSLO) 667 MG tablet Taking 3-8 tablets by mouth daily with meals and snacks     Calcium Polycarbophil (FIBERCON PO) Take 2 tablets by mouth every morning.     cinacalcet (SENSIPAR) 30 MG tablet Take 30 mg by mouth daily.     eszopiclone (LUNESTA) 2 MG TABS tablet TAKE 1 TABLET (2 MG TOTAL) BY MOUTH EVERY DAY IMMEDIATELY BEFORE BEDTIME 30 tablet 1   famotidine (PEPCID) 20 MG tablet Take 1 tablet by mouth at bedtime.     hydrALAZINE (APRESOLINE) 25 MG tablet Take 25 mg by mouth 2 (two) times daily.     hydrochlorothiazide (HYDRODIURIL) 12.5 MG tablet Take by mouth.     lactulose (CHRONULAC) 10 GM/15ML solution Take by mouth as needed.     meclizine (ANTIVERT) 25 MG tablet Take 25 mg by mouth 3 (three) times daily as needed.     Methoxy PEG-Epoetin Beta (MIRCERA IJ) Inject into  the skin every other day.     metoprolol tartrate (LOPRESSOR) 25 MG tablet Take 25 mg by mouth 2 (two) times daily.     mycophenolate (CELLCEPT) 250 MG capsule Take by mouth.     pantoprazole (PROTONIX) 20 MG tablet Take by mouth.     polyethylene glycol powder (GLYCOLAX/MIRALAX) 17 GM/SCOOP powder Take by mouth.     predniSONE (DELTASONE) 5 MG tablet Take by mouth.     senna-docusate (SENOKOT-S) 8.6-50 MG tablet Take by mouth.     sucroferric oxyhydroxide (VELPHORO) 500 MG chewable tablet Chew 500-1,000 mg by mouth See admin instructions. Chew 2 tablets (1000 mg) by mouth once or twice daily with meals, 500 mg with snacks      sulfamethoxazole-trimethoprim (BACTRIM DS) 800-160 MG tablet Take by mouth.     SUMAtriptan (IMITREX) 20 MG/ACT nasal spray SMARTSIG:Both Nares     tacrolimus (PROGRAF) 1 MG capsule Take by mouth.     tamsulosin (FLOMAX) 0.4 MG CAPS capsule Take 0.4 mg by mouth daily.     testosterone cypionate (DEPOTESTOSTERONE CYPIONATE) 200 MG/ML injection SMARTSIG:0.5 Milliliter(s) IM Once a Week     amiodarone (PACERONE) 200 MG tablet TAKE 1 TABLET BY MOUTH EVERY DAY (Patient not taking: Reported on 12/13/2023) 90 tablet 1   amoxicillin (AMOXIL) 500 MG capsule TAKE 4 CAPSULES (2,000 MG TOTAL) BY MOUTH ONCE FOR 1 DOSE 30-60 MINUTES PRIOR TO DENTAL CLEANING (Patient not taking: Reported on 12/13/2023)     gabapentin (NEURONTIN) 300 MG capsule Take 300 mg by mouth at bedtime. (Patient not taking: Reported on 12/13/2023)     tacrolimus ER (ENVARSUS XR) 4 MG TB24 Take by mouth. (Patient not taking: Reported on 12/13/2023)     No facility-administered medications prior to visit.     Per HPI unless specifically indicated in ROS section below Review of Systems  Constitutional:  Negative for chills and fever.  Cardiovascular:  Negative for chest pain.   Objective:  Ht 6\' 5"  (1.956 m)   Wt (!) 329 lb (149.2 kg) Comment: per patient.  BMI 39.01 kg/m   Wt Readings from Last 3 Encounters:  12/13/23 (!) 329 lb (149.2 kg)  08/12/23 296 lb (134.3 kg)  05/27/23 296 lb 9.6 oz (134.5 kg)       Physical exam: Gen: alert, NAD, not ill appearing Pulm: speaks in complete sentences without increased work of breathing Psych: normal mood, normal thought content      Results for orders placed or performed during the hospital encounter of 02/07/23  TSH   Collection Time: 02/07/23  4:00 PM  Result Value Ref Range   TSH 3.452 0.350 - 4.500 uIU/mL   Assessment & Plan:   Obesity (BMI 30-39.9) Assessment & Plan: Continue working on lifestyle modifications.  Will try Wegovy 0.25 mg once a week.  Patient denies any  family or personal history of medullary thyroid cancer or multiple endocrine neoplasia syndrome type II.  He does have several comorbidities inclusive of renal failure status post renal transplant.  Hypertension.  Hyperlipidemia.  Did inform patient that his primary insurance would not pay for that he try his secondary insurance.  Orders: -     Wegovy; Inject 0.25 mg into the skin once a week.  Dispense: 2 mL; Refill: 0     I discussed the assessment and treatment plan with the patient. The patient was provided an opportunity to ask questions and all were answered. The patient agreed with the plan and demonstrated an understanding of the  instructions. The patient was advised to call back or seek an in-person evaluation if the symptoms worsen or if the condition fails to improve as anticipated.  Follow up plan: Return if symptoms worsen or fail to improve, for as scheduled.  Audria Nine, NP

## 2023-12-13 NOTE — Assessment & Plan Note (Signed)
 Continue working on lifestyle modifications.  Will try Wegovy 0.25 mg once a week.  Patient denies any family or personal history of medullary thyroid cancer or multiple endocrine neoplasia syndrome type II.  He does have several comorbidities inclusive of renal failure status post renal transplant.  Hypertension.  Hyperlipidemia.  Did inform patient that his primary insurance would not pay for that he try his secondary insurance.

## 2023-12-16 ENCOUNTER — Other Ambulatory Visit (HOSPITAL_COMMUNITY): Payer: Self-pay

## 2023-12-16 ENCOUNTER — Telehealth: Payer: Self-pay

## 2023-12-16 NOTE — Telephone Encounter (Signed)
 Pharmacy Patient Advocate Encounter  Received notification from [facility] that Prior Authorization for [facility] 0.25MG /0.5ML auto-injectors has been APPROVED from 12/16/23 to 07/17/24. Spoke to pharmacy to process.Copay is $0.    PA #/Case ID/Reference #: [id number]

## 2023-12-16 NOTE — Telephone Encounter (Signed)
 Pharmacy Patient Advocate Encounter   Received notification from  [facility] Portal that prior authorization for [facility] 0.25MG /0.5ML auto-injectors is required/requested.   Insurance verification completed.   The patient is insured through [facility] .   Per test claim: PA required; PA submitted to above mentioned insurance via CoverMyMeds Key/confirmation #/EOC  [id number] Status is pending

## 2023-12-24 DIAGNOSIS — E291 Testicular hypofunction: Secondary | ICD-10-CM | POA: Diagnosis not present

## 2023-12-24 DIAGNOSIS — R948 Abnormal results of function studies of other organs and systems: Secondary | ICD-10-CM | POA: Diagnosis not present

## 2023-12-26 DIAGNOSIS — Z940 Kidney transplant status: Secondary | ICD-10-CM | POA: Diagnosis not present

## 2023-12-26 DIAGNOSIS — D849 Immunodeficiency, unspecified: Secondary | ICD-10-CM | POA: Diagnosis not present

## 2023-12-31 ENCOUNTER — Encounter: Payer: Self-pay | Admitting: Nurse Practitioner

## 2023-12-31 ENCOUNTER — Ambulatory Visit (INDEPENDENT_AMBULATORY_CARE_PROVIDER_SITE_OTHER): Payer: 59 | Admitting: Nurse Practitioner

## 2023-12-31 VITALS — BP 130/82 | HR 65 | Temp 97.8°F | Ht 75.0 in | Wt 319.2 lb

## 2023-12-31 DIAGNOSIS — G4733 Obstructive sleep apnea (adult) (pediatric): Secondary | ICD-10-CM | POA: Diagnosis not present

## 2023-12-31 DIAGNOSIS — Z0000 Encounter for general adult medical examination without abnormal findings: Secondary | ICD-10-CM

## 2023-12-31 DIAGNOSIS — G4700 Insomnia, unspecified: Secondary | ICD-10-CM | POA: Insufficient documentation

## 2023-12-31 DIAGNOSIS — Z13220 Encounter for screening for lipoid disorders: Secondary | ICD-10-CM

## 2023-12-31 DIAGNOSIS — Z1159 Encounter for screening for other viral diseases: Secondary | ICD-10-CM | POA: Diagnosis not present

## 2023-12-31 DIAGNOSIS — I159 Secondary hypertension, unspecified: Secondary | ICD-10-CM

## 2023-12-31 DIAGNOSIS — I480 Paroxysmal atrial fibrillation: Secondary | ICD-10-CM

## 2023-12-31 DIAGNOSIS — Z131 Encounter for screening for diabetes mellitus: Secondary | ICD-10-CM | POA: Diagnosis not present

## 2023-12-31 DIAGNOSIS — Z6839 Body mass index (BMI) 39.0-39.9, adult: Secondary | ICD-10-CM | POA: Diagnosis not present

## 2023-12-31 DIAGNOSIS — Z125 Encounter for screening for malignant neoplasm of prostate: Secondary | ICD-10-CM

## 2023-12-31 DIAGNOSIS — E291 Testicular hypofunction: Secondary | ICD-10-CM | POA: Diagnosis not present

## 2023-12-31 DIAGNOSIS — E669 Obesity, unspecified: Secondary | ICD-10-CM

## 2023-12-31 DIAGNOSIS — Z1329 Encounter for screening for other suspected endocrine disorder: Secondary | ICD-10-CM | POA: Diagnosis not present

## 2023-12-31 LAB — HEMOGLOBIN A1C: Hgb A1c MFr Bld: 5.1 % (ref 4.6–6.5)

## 2023-12-31 LAB — PSA, MEDICARE: PSA: 0.48 ng/mL (ref 0.10–4.00)

## 2023-12-31 LAB — LIPID PANEL
Cholesterol: 217 mg/dL — ABNORMAL HIGH (ref 0–200)
HDL: 39 mg/dL — ABNORMAL LOW (ref >39.00)
LDL Cholesterol: 149 mg/dL — ABNORMAL HIGH (ref 0–99)
NonHDL: 178.44
Total CHOL/HDL Ratio: 6
Triglycerides: 149 mg/dL (ref 0.0–149.0)
VLDL: 29.8 mg/dL (ref 0.0–40.0)

## 2023-12-31 LAB — TSH: TSH: 1.17 u[IU]/mL (ref 0.35–5.50)

## 2023-12-31 NOTE — Assessment & Plan Note (Signed)
 Patient currently maintained on amlodipine 7.5 mg daily, metoprolol 25 mg twice daily, hydrochlorothiazide 12.5 mg daily, hydralazine 25 mg twice daily.  Blood pressure within normal limits.  Take medication as prescribed

## 2023-12-31 NOTE — Assessment & Plan Note (Signed)
 Patient is been followed by cardiology he is no longer on antiarrhythmic or anticoagulation status post ablation.  Patient is maintained on metoprolol 25 mg twice daily continue taking medication as prescribed

## 2023-12-31 NOTE — Progress Notes (Signed)
 Established Patient Office Visit  Subjective   Patient ID: Eric Douglas, male    DOB: [birth year]  Age: 53 y.o. MRN: [id number]  Chief Complaint  Patient presents with   Annual Exam    HPI  for complete physical and follow up of chronic conditions.   HTN: Patient currently maintained on metoprolol 25 mg twice daily, hydrochlorothiazide 12.5 mg daily, amlodipine 7.5 mg, hydralazine 25 mg twice daily. Checking bp at home and will get 130s/70s-80s  Renal failure s/p transplant: Patient is followed through [facility]  Gout: not on any medication currenlty   Afib: Patient currently on amiodarone 200 mg daily, apixaban 5 mg twice daily. No currently on an anti arrhytmics and is followed cardiology   Hypogonadism: Patient currently maintained on testosterone IM.  He sees Berniece Salines through urology   Immunizations: -Tetanus: Completed in 2020 -Influenza: 08/22/2023 -Shingles: Completed Shingrix series -Pneumonia: Completed 02/27/2023  Diet: Fair diet. He is eating 3 smaller meals a day. He is drinking half a gallon or more or water and unsweet tea Exercise: No regular exercise. Walks with work 5-6 miles a day with work and outside of work   Eye exam: Completes annually. Needs updating  Dental exam: Completes semi-annually    Colonoscopy: Completed in 2021, repeat in 7 years Lung Cancer Screening: Completed in   PSA: Due  Sleep; goes to bed around 10 and will get up around 6. He uses the bathroom at night. Tried lunesta that does not help. States that he is feeling somewhat rested. States that his smart watch can be 75% sleep. Does snore and wears CPAP. Followed by pulmonary    Review of Systems  Constitutional:  Negative for chills and fever.  Respiratory:  Negative for shortness of breath.   Cardiovascular:  Negative for chest pain and leg swelling.  Gastrointestinal:  Negative for abdominal pain, blood in stool, constipation, diarrhea, nausea and vomiting.        Bm daily   Genitourinary:  Negative for dysuria and hematuria.       Nocturia   Neurological:  Negative for tingling and headaches.  Psychiatric/Behavioral:  Negative for hallucinations and suicidal ideas.       Objective:     BP 130/82   Pulse 65   Temp 97.8 F (36.6 C) (Oral)   Ht 6\' 3"  (1.905 m)   Wt (!) 319 lb 3.2 oz (144.8 kg)   SpO2 99%   BMI 39.90 kg/m  BP Readings from Last 3 Encounters:  12/31/23 130/82  05/27/23 132/76  02/07/23 (!) 154/84   Wt Readings from Last 3 Encounters:  12/31/23 (!) 319 lb 3.2 oz (144.8 kg)  12/13/23 (!) 329 lb (149.2 kg)  08/12/23 296 lb (134.3 kg)   SpO2 Readings from Last 3 Encounters:  12/31/23 99%  05/27/23 98%  12/27/22 97%      Physical Exam Vitals and nursing note reviewed.  Constitutional:      Appearance: Normal appearance.  HENT:     Right Ear: Tympanic membrane, ear canal and external ear normal.     Left Ear: Tympanic membrane, ear canal and external ear normal.     Mouth/Throat:     Mouth: Mucous membranes are moist.     Pharynx: Oropharynx is clear.  Eyes:     Extraocular Movements: Extraocular movements intact.     Pupils: Pupils are equal, round, and reactive to light.  Cardiovascular:     Rate and Rhythm: Normal rate and regular rhythm.  Pulses: Normal pulses.     Heart sounds: Normal heart sounds.  Pulmonary:     Effort: Pulmonary effort is normal.     Breath sounds: Normal breath sounds.  Abdominal:     General: Bowel sounds are normal. There is no distension.     Palpations: There is no mass.     Tenderness: There is no abdominal tenderness.     Hernia: No hernia is present.  Musculoskeletal:     Right lower leg: No edema.     Left lower leg: No edema.  Lymphadenopathy:     Cervical: No cervical adenopathy.  Skin:    General: Skin is warm.  Neurological:     General: No focal deficit present.     Mental Status: He is alert.     Deep Tendon Reflexes:     Reflex Scores:      Bicep  reflexes are 2+ on the right side and 2+ on the left side.      Patellar reflexes are 2+ on the right side and 2+ on the left side.    Comments: Bilateral upper and lower extremity strength 5/5  Psychiatric:        Mood and Affect: Mood normal.        Behavior: Behavior normal.        Thought Content: Thought content normal.        Judgment: Judgment normal.      No results found for any visits on 12/31/23.    The 10-year ASCVD risk score (Arnett DK, et al., 2019) is: 6.8%    Assessment & Plan:   Problem List Items Addressed This Visit       Cardiovascular and Mediastinum   HTN (hypertension) (Chronic)   Patient currently maintained on amlodipine 7.5 mg daily, metoprolol 25 mg twice daily, hydrochlorothiazide 12.5 mg daily, hydralazine 25 mg twice daily.  Blood pressure within normal limits.  Take medication as prescribed      Relevant Medications   amLODipine (NORVASC) 5 MG tablet   Paroxysmal atrial fibrillation [facility])   Patient is been followed by cardiology he is no longer on antiarrhythmic or anticoagulation status post ablation.  Patient is maintained on metoprolol 25 mg twice daily continue taking medication as prescribed      Relevant Medications   amLODipine (NORVASC) 5 MG tablet     Respiratory   OSA on CPAP   Patient is adherent to CPAP therapy and followed by pulmonology.  Continue follow-up with specialist as recommended continue As directed        Other   Preventative health care - Primary   Discussed age-appropriate immunizations and screening exams.  Did review patient's personal, surgical, social, family histories.  Patient up-to-date on all age-appropriate vaccinations he would like.  Patient is up-to-date on CRC screening.  PSA for prostate cancer screening today.  Patient was given information at discharge about preventative healthcare maintenance with anticipatory guidance      Obesity (BMI 30-39.9)   Pending TSH, A1c, lipid panel.  Continue work  on healthy lifestyle modifications.  Patient currently maintained on Wegovy 0.25 mg once a week      Relevant Orders   Hemoglobin A1c   Lipid panel   Insomnia   History of same has tried and failed Lunesta stable at this juncture      Relevant Orders   TSH   Other Visit Diagnoses       Encounter for hepatitis C screening test for low risk  patient       Relevant Orders   Hepatitis C Antibody     Screening for prostate cancer       Relevant Orders   PSA, Medicare     Screening for lipid disorders       Relevant Orders   Lipid panel     Screening for diabetes mellitus       Relevant Orders   Hemoglobin A1c     Screening for thyroid disorder           Return in about 3 months (around 04/01/2024) for weight/wegovy recheck .    Audria Nine, NP

## 2023-12-31 NOTE — Patient Instructions (Addendum)
 Nice to see you today I will be in touch with the labs once I have them  Follow up with me in 3 months, sooner if you need me Let me know when you take the last dose of the wegovy

## 2023-12-31 NOTE — Assessment & Plan Note (Signed)
 History of same has tried and failed Lunesta stable at this juncture

## 2023-12-31 NOTE — Assessment & Plan Note (Signed)
 Discussed age-appropriate immunizations and screening exams.  Did review patient's personal, surgical, social, family histories.  Patient up-to-date on all age-appropriate vaccinations he would like.  Patient is up-to-date on CRC screening.  PSA for prostate cancer screening today.  Patient was given information at discharge about preventative healthcare maintenance with anticipatory guidance

## 2023-12-31 NOTE — Assessment & Plan Note (Signed)
 Patient is adherent to CPAP therapy and followed by pulmonology.  Continue follow-up with specialist as recommended continue As directed

## 2023-12-31 NOTE — Assessment & Plan Note (Signed)
 Pending TSH, A1c, lipid panel.  Continue work on healthy lifestyle modifications.  Patient currently maintained on Wegovy 0.25 mg once a week

## 2024-01-01 ENCOUNTER — Encounter: Payer: Self-pay | Admitting: Nurse Practitioner

## 2024-01-01 LAB — HEPATITIS C ANTIBODY: Hepatitis C Ab: NONREACTIVE

## 2024-01-06 ENCOUNTER — Other Ambulatory Visit: Payer: Self-pay | Admitting: Nurse Practitioner

## 2024-01-06 DIAGNOSIS — E669 Obesity, unspecified: Secondary | ICD-10-CM

## 2024-01-06 DIAGNOSIS — Z940 Kidney transplant status: Secondary | ICD-10-CM | POA: Diagnosis not present

## 2024-01-06 DIAGNOSIS — D849 Immunodeficiency, unspecified: Secondary | ICD-10-CM | POA: Diagnosis not present

## 2024-01-06 MED ORDER — WEGOVY 0.5 MG/0.5ML ~~LOC~~ SOAJ: 0.5000 mg | SUBCUTANEOUS | 0 refills | Status: DC

## 2024-01-06 NOTE — Telephone Encounter (Signed)
 Copied from CRM [id number]. Topic: Clinical - Medication Refill >> Jan 06, 2024 12:32 PM Almira Coaster wrote: Most Recent Primary Care Visit:  Provider: Eden Emms  Department: [facility]  Visit Type: PHYSICAL  Date: 12/31/2023  Medication: Semaglutide-Weight Management (WEGOVY) 0.25 MG/0.5ML Ivory Broad [[id number]]  Has the patient contacted their pharmacy? Yes (Agent: If no, request that the patient contact the pharmacy for the refill. If patient does not wish to contact the pharmacy document the reason why and proceed with request.) (Agent: If yes, when and what did the pharmacy advise?)  Is this the correct pharmacy for this prescription? Yes If no, delete pharmacy and type the correct one.  This is the patient's preferred [phone] [place] - [address] [place] [address] Phone: [phone] Fax: [phone]   Has the prescription been filled recently? Yes  Is the patient out of the medication? Yes  Has the patient been seen for an appointment in the last year OR does the patient have an upcoming appointment? Yes  Can we respond through MyChart? Yes  Agent: Please be advised that Rx refills may take up to 3 business days. We ask that you follow-up with your pharmacy.

## 2024-01-08 DIAGNOSIS — Z79899 Other long term (current) drug therapy: Secondary | ICD-10-CM | POA: Diagnosis not present

## 2024-01-10 DIAGNOSIS — Z5181 Encounter for therapeutic drug level monitoring: Secondary | ICD-10-CM | POA: Diagnosis not present

## 2024-01-10 DIAGNOSIS — Z940 Kidney transplant status: Secondary | ICD-10-CM | POA: Diagnosis not present

## 2024-01-14 ENCOUNTER — Ambulatory Visit (INDEPENDENT_AMBULATORY_CARE_PROVIDER_SITE_OTHER): Admitting: Internal Medicine

## 2024-01-14 ENCOUNTER — Encounter: Payer: Self-pay | Admitting: Internal Medicine

## 2024-01-14 VITALS — BP 138/88 | HR 68 | Temp 97.7°F | Ht 75.0 in | Wt 315.0 lb

## 2024-01-14 DIAGNOSIS — K121 Other forms of stomatitis: Secondary | ICD-10-CM | POA: Insufficient documentation

## 2024-01-14 MED ORDER — AMOXICILLIN-POT CLAVULANATE 875-125 MG PO TABS: 1.0000 | ORAL_TABLET | Freq: Two times a day (BID) | ORAL | 1 refills | Status: DC

## 2024-01-14 MED ORDER — CLOBETASOL PROPIONATE 0.05 % EX OINT: 1.0000 | TOPICAL_OINTMENT | Freq: Two times a day (BID) | CUTANEOUS | 0 refills | Status: DC

## 2024-01-14 NOTE — Assessment & Plan Note (Signed)
 Looks more like infection (on immunosuppression since kidney transplant) then aphthous Will try augmentin x 5 days (875 mg bid) Topical steroid --clobetasol --bid

## 2024-01-14 NOTE — Progress Notes (Signed)
 Subjective:    Patient ID: Eric Douglas, male    DOB: October 06, 1971, 53 y.o.   MRN: [id number]  HPI Here due to a sore in his mouth  Bit lip a couple of weeks ago A few days later--started with ulcer on inside lower lip--and continued to grow Very painful May be improving some but ongoing--so came in  Tried salt water rinse Orajel--slight help with the pain  Current Outpatient Medications on File Prior to Visit  Medication Sig Dispense Refill   acetaminophen (TYLENOL) 325 MG tablet Take by mouth.     amLODipine (NORVASC) 2.5 MG tablet 7.5 mg.     amLODipine (NORVASC) 5 MG tablet Take 7.5 mg by mouth daily.     amoxicillin (AMOXIL) 500 MG capsule      Calcium Polycarbophil (FIBERCON PO) Take 2 tablets by mouth every morning.     cinacalcet (SENSIPAR) 30 MG tablet Take 30 mg by mouth daily.     famotidine (PEPCID) 20 MG tablet Take 1 tablet by mouth at bedtime.     hydrALAZINE (APRESOLINE) 25 MG tablet Take 25 mg by mouth 2 (two) times daily.     hydrochlorothiazide (HYDRODIURIL) 12.5 MG tablet Take by mouth.     meclizine (ANTIVERT) 25 MG tablet Take 25 mg by mouth 3 (three) times daily as needed.     Methoxy PEG-Epoetin Beta (MIRCERA IJ) Inject into the skin every other day.     metoprolol tartrate (LOPRESSOR) 25 MG tablet Take 25 mg by mouth 2 (two) times daily.     mycophenolate (CELLCEPT) 250 MG capsule Take by mouth.     predniSONE (DELTASONE) 5 MG tablet Take by mouth.     Semaglutide-Weight Management (WEGOVY) 0.5 MG/0.5ML SOAJ Inject 0.5 mg into the skin once a week. 2 mL 0   senna-docusate (SENOKOT-S) 8.6-50 MG tablet Take by mouth.     sulfamethoxazole-trimethoprim (BACTRIM DS) 800-160 MG tablet Take by mouth.     SUMAtriptan (IMITREX) 20 MG/ACT nasal spray SMARTSIG:Both Nares     tacrolimus (PROGRAF) 1 MG capsule Take by mouth.     tacrolimus ER (ENVARSUS XR) 4 MG TB24 Take by mouth.     tamsulosin (FLOMAX) 0.4 MG CAPS capsule Take 0.4 mg by mouth daily.      testosterone cypionate (DEPOTESTOSTERONE CYPIONATE) 200 MG/ML injection SMARTSIG:0.5 Milliliter(s) IM Once a Week     valGANciclovir (VALCYTE) 450 MG tablet Take by mouth.     No current facility-administered medications on file prior to visit.    No Known Allergies  Past Medical History:  Diagnosis Date   Fibula fracture    ORIF 1990   Gout    non crystal proven    History of chickenpox    History of kidney disease    HTN (hypertension)    Nephropathy    Ig A; dx 2002. micropscopic hematuria and proteinuria   Tibia fracture    ORIF 1990   Varicocele    L testes   Venous incompetence    R leg    Past Surgical History:  Procedure Laterality Date   APPENDECTOMY  1982   AV FISTULA PLACEMENT Left 06/27/2017   Procedure: LEFT ARM ARTERIOVENOUS (AV) FISTULA CREATION;  Surgeon: Maeola Harman, MD;  Location: [facility] OR;  Service: Vascular;  Laterality: Left;   cardiac ablation  07/16/2023   CARDIOVERSION N/A 12/21/2021   Procedure: CARDIOVERSION;  Surgeon: Thurmon Fair, MD;  Location: [facility] ENDOSCOPY;  Service: Cardiovascular;  Laterality: N/A;   KIDNEY  TRANSPLANT  04/07/2023   stab phlebectomy Left 12/17/2016   stab phlebectomy >20 incisions (left leg) by Josephina Gip MD     Family History  Problem Relation Age of Onset   Cancer Paternal Grandfather 12       brain cancer   Hypertension Other        GP   Stroke Other    Diabetes Other        DM - GP    Heart disease Neg Hx     Social History   Socioeconomic History   Marital status: Divorced    Spouse name: Not on file   Number of children: 2   Years of education: Not on file   Highest education level: Not on file  Occupational History   Occupation: Service Tech    Comment: [employer]  Tobacco Use   Smoking status: Never   Smokeless tobacco: Never   Tobacco comments:    Never smoke 12/28/21  Vaping Use   Vaping status: Never Used  Substance and Sexual Activity   Alcohol use: No     Alcohol/week: 0.0 standard drinks of alcohol   Drug use: No   Sexual activity: Yes  Other Topics Concern   Not on file  Social History Narrative   Divorced, primary custody of son; [place]; gets regular exercise.       Pt signed designated party release and gives no access to medical records. Can leave msg on cell [phone].    Social Drivers of Corporate investment banker Strain: Low Risk  (08/12/2023)   Overall Financial Resource Strain (CARDIA)    Difficulty of Paying Living Expenses: Not hard at all  Food Insecurity: No Food Insecurity (08/12/2023)   Hunger Vital Sign    Worried About Running Out of Food in the Last Year: Never true    Ran Out of Food in the Last Year: Never true  Transportation Needs: No Transportation Needs (08/12/2023)   PRAPARE - Administrator, Civil Service (Medical): No    Lack of Transportation (Non-Medical): No  Physical Activity: Inactive (08/12/2023)   Exercise Vital Sign    Days of Exercise per Week: 0 days    Minutes of Exercise per Session: 0 min  Stress: No Stress Concern Present (08/12/2023)   [employer] of Occupational Health - Occupational Stress Questionnaire    Feeling of Stress : Not at all  Social Connections: Moderately Isolated (08/12/2023)   Social Connection and Isolation Panel [NHANES]    Frequency of Communication with Friends and Family: More than three times a week    Frequency of Social Gatherings with Friends and Family: Three times a week    Attends Religious Services: More than 4 times per year    Active Member of Clubs or Organizations: No    Attends Banker Meetings: Never    Marital Status: Divorced  Catering manager Violence: Not At Risk (08/12/2023)   Humiliation, Afraid, Rape, and Kick questionnaire    Fear of Current or Ex-Partner: No    Emotionally Abused: No    Physically Abused: No    Sexually Abused: No   Review of Systems No fever No lip swelling      Objective:   Physical Exam HENT:     Mouth/Throat:     Comments: ~1 cm ulcer on inner lip opposite left premolars Heavy white covering with surrounding redness and tenderness  Assessment & Plan:

## 2024-01-17 DIAGNOSIS — Z940 Kidney transplant status: Secondary | ICD-10-CM | POA: Diagnosis not present

## 2024-01-17 DIAGNOSIS — D849 Immunodeficiency, unspecified: Secondary | ICD-10-CM | POA: Diagnosis not present

## 2024-01-20 DIAGNOSIS — Z940 Kidney transplant status: Secondary | ICD-10-CM | POA: Diagnosis not present

## 2024-01-20 DIAGNOSIS — H8191 Unspecified disorder of vestibular function, right ear: Secondary | ICD-10-CM | POA: Diagnosis not present

## 2024-01-20 DIAGNOSIS — R519 Headache, unspecified: Secondary | ICD-10-CM | POA: Diagnosis not present

## 2024-01-20 DIAGNOSIS — H832X1 Labyrinthine dysfunction, right ear: Secondary | ICD-10-CM | POA: Diagnosis not present

## 2024-01-20 DIAGNOSIS — H6123 Impacted cerumen, bilateral: Secondary | ICD-10-CM | POA: Diagnosis not present

## 2024-01-20 DIAGNOSIS — H8101 Meniere's disease, right ear: Secondary | ICD-10-CM | POA: Diagnosis not present

## 2024-01-20 DIAGNOSIS — Z0110 Encounter for examination of ears and hearing without abnormal findings: Secondary | ICD-10-CM | POA: Diagnosis not present

## 2024-01-20 DIAGNOSIS — Z8616 Personal history of COVID-19: Secondary | ICD-10-CM | POA: Diagnosis not present

## 2024-01-20 DIAGNOSIS — H903 Sensorineural hearing loss, bilateral: Secondary | ICD-10-CM | POA: Diagnosis not present

## 2024-01-20 DIAGNOSIS — Z79899 Other long term (current) drug therapy: Secondary | ICD-10-CM | POA: Diagnosis not present

## 2024-01-30 DIAGNOSIS — D849 Immunodeficiency, unspecified: Secondary | ICD-10-CM | POA: Diagnosis not present

## 2024-01-30 DIAGNOSIS — Z940 Kidney transplant status: Secondary | ICD-10-CM | POA: Diagnosis not present

## 2024-02-02 ENCOUNTER — Other Ambulatory Visit: Payer: Self-pay | Admitting: Nurse Practitioner

## 2024-02-03 ENCOUNTER — Other Ambulatory Visit: Payer: Self-pay | Admitting: Nurse Practitioner

## 2024-02-03 NOTE — Telephone Encounter (Signed)
 Patient advised that he was told by his PCP Winthrop Hawks that every time he finished a four week cycle he could increase the dosage of Semaglutide .  Patient just completed 0.5mg  dosage and wanted to increase to the next dosage. Patient states that he has no side effects, questions, or complaints.  He states that the medication is working great.

## 2024-02-03 NOTE — Telephone Encounter (Signed)
 Copied from CRM [id number]. Topic: Clinical - Medication Refill >> Feb 03, 2024  8:05 AM Howard Macho wrote: Most Recent Primary Care Visit:  Provider: Curt Dover I  Department: [facility]  Visit Type: ACUTE  Date: 01/14/2024  Medication: Semaglutide -Weight Management (WEGOVY ) 0.5 MG/0.5ML SOAJ (Patient would like an increase)  Has the patient contacted their pharmacy? Yes (Agent: If no, request that the patient contact the pharmacy for the refill. If patient does not wish to contact the pharmacy document the reason why and proceed with request.) (Agent: If yes, when and what did the pharmacy advise?)  Is this the correct pharmacy for this prescription? Yes If no, delete pharmacy and type the correct one.  This is the patient's preferred pharmacy:  [facility] [phone] [facility], [place] - [address] [place] [address] Phone: [phone] Fax: [phone]   Has the prescription been filled recently? Yes  Is the patient out of the medication? Yes  Has the patient been seen for an appointment in the last year OR does the patient have an upcoming appointment? Yes  Can we respond through MyChart? Yes  Agent: Please be advised that Rx refills may take up to 3 business days. We ask that you follow-up with your pharmacy.

## 2024-02-06 DIAGNOSIS — T8619 Other complication of kidney transplant: Secondary | ICD-10-CM | POA: Diagnosis not present

## 2024-02-06 DIAGNOSIS — G4733 Obstructive sleep apnea (adult) (pediatric): Secondary | ICD-10-CM | POA: Diagnosis not present

## 2024-02-06 DIAGNOSIS — D849 Immunodeficiency, unspecified: Secondary | ICD-10-CM | POA: Diagnosis not present

## 2024-02-06 DIAGNOSIS — I10 Essential (primary) hypertension: Secondary | ICD-10-CM | POA: Diagnosis not present

## 2024-02-06 DIAGNOSIS — Z940 Kidney transplant status: Secondary | ICD-10-CM | POA: Diagnosis not present

## 2024-02-06 DIAGNOSIS — R519 Headache, unspecified: Secondary | ICD-10-CM | POA: Diagnosis not present

## 2024-02-06 DIAGNOSIS — Z79899 Other long term (current) drug therapy: Secondary | ICD-10-CM | POA: Diagnosis not present

## 2024-02-06 DIAGNOSIS — Z4822 Encounter for aftercare following kidney transplant: Secondary | ICD-10-CM | POA: Diagnosis not present

## 2024-02-06 DIAGNOSIS — G44019 Episodic cluster headache, not intractable: Secondary | ICD-10-CM | POA: Diagnosis not present

## 2024-02-06 DIAGNOSIS — G43109 Migraine with aura, not intractable, without status migrainosus: Secondary | ICD-10-CM | POA: Diagnosis not present

## 2024-02-06 DIAGNOSIS — I480 Paroxysmal atrial fibrillation: Secondary | ICD-10-CM | POA: Diagnosis not present

## 2024-02-06 DIAGNOSIS — D84821 Immunodeficiency due to drugs: Secondary | ICD-10-CM | POA: Diagnosis not present

## 2024-02-06 DIAGNOSIS — Z7901 Long term (current) use of anticoagulants: Secondary | ICD-10-CM | POA: Diagnosis not present

## 2024-02-06 DIAGNOSIS — D709 Neutropenia, unspecified: Secondary | ICD-10-CM | POA: Diagnosis not present

## 2024-02-06 DIAGNOSIS — N401 Enlarged prostate with lower urinary tract symptoms: Secondary | ICD-10-CM | POA: Diagnosis not present

## 2024-02-06 DIAGNOSIS — M109 Gout, unspecified: Secondary | ICD-10-CM | POA: Diagnosis not present

## 2024-02-06 DIAGNOSIS — N400 Enlarged prostate without lower urinary tract symptoms: Secondary | ICD-10-CM | POA: Diagnosis not present

## 2024-02-13 DIAGNOSIS — D849 Immunodeficiency, unspecified: Secondary | ICD-10-CM | POA: Diagnosis not present

## 2024-02-13 DIAGNOSIS — Z940 Kidney transplant status: Secondary | ICD-10-CM | POA: Diagnosis not present

## 2024-02-15 DIAGNOSIS — R519 Headache, unspecified: Secondary | ICD-10-CM | POA: Diagnosis not present

## 2024-02-27 DIAGNOSIS — D849 Immunodeficiency, unspecified: Secondary | ICD-10-CM | POA: Diagnosis not present

## 2024-02-27 DIAGNOSIS — Z940 Kidney transplant status: Secondary | ICD-10-CM | POA: Diagnosis not present

## 2024-03-02 ENCOUNTER — Encounter: Payer: Self-pay | Admitting: Nurse Practitioner

## 2024-03-02 MED ORDER — WEGOVY 1.7 MG/0.75ML ~~LOC~~ SOAJ: 1.7000 mg | SUBCUTANEOUS | 0 refills | Status: DC

## 2024-03-02 NOTE — Telephone Encounter (Signed)
 Copied from CRM [id number]. Topic: Clinical - Medication Question >> Mar 02, 2024  8:22 AM Bambi Bonine D wrote: Reason for CRM: Pt stated that he needs a new prescription for the Semaglutide -Weight Management (WEGOVY ) 1 MG/0.5ML SOAJ. Pt stated that Winthrop Hawks, NP, informed the pt that he would be increasing his dosage for the medication and to call the office once he was out of the medication to start a new prescription.

## 2024-03-11 DIAGNOSIS — D849 Immunodeficiency, unspecified: Secondary | ICD-10-CM | POA: Diagnosis not present

## 2024-03-11 DIAGNOSIS — Z940 Kidney transplant status: Secondary | ICD-10-CM | POA: Diagnosis not present

## 2024-03-12 DIAGNOSIS — Z940 Kidney transplant status: Secondary | ICD-10-CM | POA: Diagnosis not present

## 2024-03-12 DIAGNOSIS — D849 Immunodeficiency, unspecified: Secondary | ICD-10-CM | POA: Diagnosis not present

## 2024-03-26 DIAGNOSIS — Z940 Kidney transplant status: Secondary | ICD-10-CM | POA: Diagnosis not present

## 2024-03-26 DIAGNOSIS — D849 Immunodeficiency, unspecified: Secondary | ICD-10-CM | POA: Diagnosis not present

## 2024-03-28 ENCOUNTER — Other Ambulatory Visit: Payer: Self-pay | Admitting: Nurse Practitioner

## 2024-03-29 ENCOUNTER — Encounter: Payer: Self-pay | Admitting: Nurse Practitioner

## 2024-03-30 MED ORDER — WEGOVY 2.4 MG/0.75ML ~~LOC~~ SOAJ: 2.4000 mg | SUBCUTANEOUS | 0 refills | Status: DC

## 2024-03-30 NOTE — Telephone Encounter (Signed)
 Needs weight follow up office visit with me within the next 30 days please

## 2024-03-30 NOTE — Telephone Encounter (Signed)
 E2c2 schedule appt for pt

## 2024-04-06 DIAGNOSIS — D2261 Melanocytic nevi of right upper limb, including shoulder: Secondary | ICD-10-CM | POA: Diagnosis not present

## 2024-04-06 DIAGNOSIS — L821 Other seborrheic keratosis: Secondary | ICD-10-CM | POA: Diagnosis not present

## 2024-04-06 DIAGNOSIS — B078 Other viral warts: Secondary | ICD-10-CM | POA: Diagnosis not present

## 2024-04-06 DIAGNOSIS — D1801 Hemangioma of skin and subcutaneous tissue: Secondary | ICD-10-CM | POA: Diagnosis not present

## 2024-04-06 DIAGNOSIS — D2272 Melanocytic nevi of left lower limb, including hip: Secondary | ICD-10-CM | POA: Diagnosis not present

## 2024-04-06 DIAGNOSIS — D2271 Melanocytic nevi of right lower limb, including hip: Secondary | ICD-10-CM | POA: Diagnosis not present

## 2024-04-06 DIAGNOSIS — D2262 Melanocytic nevi of left upper limb, including shoulder: Secondary | ICD-10-CM | POA: Diagnosis not present

## 2024-04-06 DIAGNOSIS — L814 Other melanin hyperpigmentation: Secondary | ICD-10-CM | POA: Diagnosis not present

## 2024-04-06 DIAGNOSIS — D225 Melanocytic nevi of trunk: Secondary | ICD-10-CM | POA: Diagnosis not present

## 2024-04-07 DIAGNOSIS — Z7985 Long-term (current) use of injectable non-insulin antidiabetic drugs: Secondary | ICD-10-CM | POA: Diagnosis not present

## 2024-04-07 DIAGNOSIS — Z940 Kidney transplant status: Secondary | ICD-10-CM | POA: Diagnosis not present

## 2024-04-07 DIAGNOSIS — I10 Essential (primary) hypertension: Secondary | ICD-10-CM | POA: Diagnosis not present

## 2024-04-07 DIAGNOSIS — D709 Neutropenia, unspecified: Secondary | ICD-10-CM | POA: Diagnosis not present

## 2024-04-07 DIAGNOSIS — D849 Immunodeficiency, unspecified: Secondary | ICD-10-CM | POA: Diagnosis not present

## 2024-04-07 DIAGNOSIS — D84821 Immunodeficiency due to drugs: Secondary | ICD-10-CM | POA: Diagnosis not present

## 2024-04-07 DIAGNOSIS — E669 Obesity, unspecified: Secondary | ICD-10-CM | POA: Diagnosis not present

## 2024-04-07 DIAGNOSIS — Z7952 Long term (current) use of systemic steroids: Secondary | ICD-10-CM | POA: Diagnosis not present

## 2024-04-07 DIAGNOSIS — G43909 Migraine, unspecified, not intractable, without status migrainosus: Secondary | ICD-10-CM | POA: Diagnosis not present

## 2024-04-07 DIAGNOSIS — Z792 Long term (current) use of antibiotics: Secondary | ICD-10-CM | POA: Diagnosis not present

## 2024-04-07 DIAGNOSIS — Z4822 Encounter for aftercare following kidney transplant: Secondary | ICD-10-CM | POA: Diagnosis not present

## 2024-04-07 DIAGNOSIS — N400 Enlarged prostate without lower urinary tract symptoms: Secondary | ICD-10-CM | POA: Diagnosis not present

## 2024-04-07 DIAGNOSIS — I480 Paroxysmal atrial fibrillation: Secondary | ICD-10-CM | POA: Diagnosis not present

## 2024-04-07 DIAGNOSIS — Z79899 Other long term (current) drug therapy: Secondary | ICD-10-CM | POA: Diagnosis not present

## 2024-04-20 DIAGNOSIS — H9313 Tinnitus, bilateral: Secondary | ICD-10-CM | POA: Diagnosis not present

## 2024-04-20 DIAGNOSIS — Z992 Dependence on renal dialysis: Secondary | ICD-10-CM | POA: Diagnosis not present

## 2024-04-20 DIAGNOSIS — H8101 Meniere's disease, right ear: Secondary | ICD-10-CM | POA: Diagnosis not present

## 2024-04-20 DIAGNOSIS — H9193 Unspecified hearing loss, bilateral: Secondary | ICD-10-CM | POA: Diagnosis not present

## 2024-04-20 DIAGNOSIS — Z8616 Personal history of COVID-19: Secondary | ICD-10-CM | POA: Diagnosis not present

## 2024-04-20 DIAGNOSIS — Z940 Kidney transplant status: Secondary | ICD-10-CM | POA: Diagnosis not present

## 2024-04-20 DIAGNOSIS — H903 Sensorineural hearing loss, bilateral: Secondary | ICD-10-CM | POA: Diagnosis not present

## 2024-04-23 DIAGNOSIS — Z940 Kidney transplant status: Secondary | ICD-10-CM | POA: Diagnosis not present

## 2024-04-23 DIAGNOSIS — D849 Immunodeficiency, unspecified: Secondary | ICD-10-CM | POA: Diagnosis not present

## 2024-04-24 ENCOUNTER — Other Ambulatory Visit: Payer: Self-pay | Admitting: Nurse Practitioner

## 2024-04-29 ENCOUNTER — Ambulatory Visit (INDEPENDENT_AMBULATORY_CARE_PROVIDER_SITE_OTHER): Admitting: Nurse Practitioner

## 2024-04-29 VITALS — BP 122/80 | HR 78 | Temp 98.4°F | Ht 75.0 in | Wt 265.5 lb

## 2024-04-29 DIAGNOSIS — E669 Obesity, unspecified: Secondary | ICD-10-CM

## 2024-04-29 MED ORDER — WEGOVY 2.4 MG/0.75ML ~~LOC~~ SOAJ: 2.4000 mg | SUBCUTANEOUS | 0 refills | Status: DC

## 2024-04-29 NOTE — Assessment & Plan Note (Signed)
 On wegovy  2.4mg  weekly. Doing well he has had a total of 65 pound weight loss since the beginning of the year. Continue working on lifestyle modifications and wegovy .

## 2024-04-29 NOTE — Progress Notes (Signed)
   Established Patient Office Visit  Subjective   Patient ID: Eric Douglas, male    DOB: Oct 25, 1970  Age: 53 y.o. MRN: [id number]  Chief Complaint  Patient presents with   Follow-up    Pt complains of doing really great on wegovy .     HPI  Obesity: patient was seen by me on 12/31/2023 for CPE. His weight at that point was 319 pounds. He is currently on weogvy 2.4mg  and is here for follow up. Not having any side effects of the wegovy . He does use fiber con and helps   States that he is walking a lot. He is doing house chores like cutting wood and splitting it. He does approx 10000 steps a day  He is eating 3-4 times a day. He states that he eats because he has to. Protien bar for breakfast. He is eating smaller portions. He has also stopped eating junk food.    Review of Systems  Constitutional:  Negative for chills and fever.  Respiratory:  Negative for shortness of breath.   Cardiovascular:  Negative for chest pain.  Gastrointestinal:        BM every 3rd day.   Neurological:  Negative for dizziness and headaches.      Objective:     BP 122/80   Pulse 78   Temp 98.4 F (36.9 C) (Oral)   Ht 6' 3 (1.905 m)   Wt 265 lb 8 oz (120.4 kg)   SpO2 98%   BMI 33.19 kg/m  BP Readings from Last 3 Encounters:  04/29/24 122/80  01/14/24 138/88  12/31/23 130/82   Wt Readings from Last 3 Encounters:  04/29/24 265 lb 8 oz (120.4 kg)  01/14/24 (!) 315 lb (142.9 kg)  12/31/23 (!) 319 lb 3.2 oz (144.8 kg)   SpO2 Readings from Last 3 Encounters:  04/29/24 98%  01/14/24 95%  12/31/23 99%      Physical Exam Vitals and nursing note reviewed.  Constitutional:      Appearance: Normal appearance.  Cardiovascular:     Rate and Rhythm: Normal rate and regular rhythm.     Heart sounds: Normal heart sounds.  Pulmonary:     Effort: Pulmonary effort is normal.     Breath sounds: Normal breath sounds.  Abdominal:     General: Bowel sounds are normal.  Neurological:      Mental Status: He is alert.      No results found for any visits on 04/29/24.    The 10-year ASCVD risk score (Arnett DK, et al., 2019) is: 7%    Assessment & Plan:   Problem List Items Addressed This Visit       Other   Obesity (BMI 30-39.9) - Primary   On wegovy  2.4mg  weekly. Doing well he has had a total of 65 pound weight loss since the beginning of the year. Continue working on lifestyle modifications and wegovy .       Relevant Medications   Semaglutide -Weight Management (WEGOVY ) 2.4 MG/0.75ML SOAJ    Return in about 3 months (around 07/30/2024) for wegovy /obesity .    Adina Crandall, NP

## 2024-04-29 NOTE — Patient Instructions (Signed)
 Nice to see you today I have sent in 3 months of the wegovy  to the pharmacy  Follow up with me in 3 months

## 2024-05-21 DIAGNOSIS — D849 Immunodeficiency, unspecified: Secondary | ICD-10-CM | POA: Diagnosis not present

## 2024-05-21 DIAGNOSIS — Z940 Kidney transplant status: Secondary | ICD-10-CM | POA: Diagnosis not present

## 2024-06-01 DIAGNOSIS — M25512 Pain in left shoulder: Secondary | ICD-10-CM | POA: Diagnosis not present

## 2024-06-04 DIAGNOSIS — I10 Essential (primary) hypertension: Secondary | ICD-10-CM | POA: Diagnosis not present

## 2024-06-04 DIAGNOSIS — N189 Chronic kidney disease, unspecified: Secondary | ICD-10-CM | POA: Diagnosis not present

## 2024-06-04 DIAGNOSIS — D849 Immunodeficiency, unspecified: Secondary | ICD-10-CM | POA: Diagnosis not present

## 2024-06-04 DIAGNOSIS — I480 Paroxysmal atrial fibrillation: Secondary | ICD-10-CM | POA: Diagnosis not present

## 2024-06-18 DIAGNOSIS — D849 Immunodeficiency, unspecified: Secondary | ICD-10-CM | POA: Diagnosis not present

## 2024-06-18 DIAGNOSIS — Z940 Kidney transplant status: Secondary | ICD-10-CM | POA: Diagnosis not present

## 2024-06-24 DIAGNOSIS — B078 Other viral warts: Secondary | ICD-10-CM | POA: Diagnosis not present

## 2024-07-01 ENCOUNTER — Encounter: Payer: Self-pay | Admitting: Podiatry

## 2024-07-01 ENCOUNTER — Ambulatory Visit (INDEPENDENT_AMBULATORY_CARE_PROVIDER_SITE_OTHER): Admitting: Podiatry

## 2024-07-01 VITALS — Ht 75.0 in | Wt 265.5 lb

## 2024-07-01 DIAGNOSIS — B070 Plantar wart: Secondary | ICD-10-CM

## 2024-07-01 NOTE — Progress Notes (Signed)
   Chief Complaint  Patient presents with   Callouses    Pt is here due to bilateral callous on the side of each foot, and possible plantar wart on the right foot, that is causing some pain.    Subjective: 53 y.o. male presenting to the office today as a reestablish new patient for evaluation of a symptomatic skin lesion to the plantar aspect of the right foot concerning for possible wart.  Recent kidney transplant recipient.  After this he developed a verruca lesion to both the hand and the foot.   Past Medical History:  Diagnosis Date   Fibula fracture    ORIF 1990   Gout    non crystal proven    History of chickenpox    History of kidney disease    HTN (hypertension)    Nephropathy    Ig A; dx 2002. micropscopic hematuria and proteinuria   Tibia fracture    ORIF 1990   Varicocele    L testes   Venous incompetence    R leg    Past Surgical History:  Procedure Laterality Date   APPENDECTOMY  1982   AV FISTULA PLACEMENT Left 06/27/2017   Procedure: LEFT ARM ARTERIOVENOUS (AV) FISTULA CREATION;  Surgeon: Sheree Penne Bruckner, MD;  Location: [facility] OR;  Service: Vascular;  Laterality: Left;   cardiac ablation  07/16/2023   CARDIOVERSION N/A 12/21/2021   Procedure: CARDIOVERSION;  Surgeon: Francyne Headland, MD;  Location: [facility];  Service: Cardiovascular;  Laterality: N/A;   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   stab phlebectomy Left 12/17/2016   stab phlebectomy >20 incisions (left leg) by Lynwood Collum MD     No Known Allergies   Objective:  Physical Exam General: Alert and oriented x3 in no acute distress  Dermatology: Hyperkeratotic lesion(s) present on the plantar aspect of the right forefoot. Pain on palpation with a central nucleated core noted. Skin is warm, dry and supple bilateral lower extremities. Negative for open lesions or macerations.  Vascular: Palpable pedal pulses bilaterally. No edema or erythema noted. Capillary refill within normal  limits.  Neurological: Grossly intact via light touch  Musculoskeletal Exam: Tenderness on palpation at the keratotic lesion(s) noted. Range of motion within normal limits bilateral. Muscle strength 5/5 in all groups bilateral.  High arches noted bilateral  Assessment: 1.  Symptomatic benign skin lesion 2.  High arches bilateral feet  Plan of Care:  -Patient evaluated -Excisional debridement of keratoic lesion(s) using a chisel blade was performed without incident.  -Salicylic acid applied with a bandaid.  Recommend OTC salicylic acid daily PRN -OTC power step insoles were provided for the patient to support the arches of the feet bilateral -Return to the clinic PRN.   *Goes [employer]. Works for [employer]   Thresa EMERSON Sar, DPM [facility]  Dr. Thresa EMERSON Sar, DPM    [address] [place], [place] [address]                Office [phone]  Fax [phone]

## 2024-07-08 DIAGNOSIS — Z940 Kidney transplant status: Secondary | ICD-10-CM | POA: Diagnosis not present

## 2024-07-08 DIAGNOSIS — D849 Immunodeficiency, unspecified: Secondary | ICD-10-CM | POA: Diagnosis not present

## 2024-07-08 DIAGNOSIS — Z87898 Personal history of other specified conditions: Secondary | ICD-10-CM | POA: Diagnosis not present

## 2024-07-08 DIAGNOSIS — N401 Enlarged prostate with lower urinary tract symptoms: Secondary | ICD-10-CM | POA: Diagnosis not present

## 2024-07-08 DIAGNOSIS — I10 Essential (primary) hypertension: Secondary | ICD-10-CM | POA: Diagnosis not present

## 2024-07-08 DIAGNOSIS — Z6830 Body mass index (BMI) 30.0-30.9, adult: Secondary | ICD-10-CM | POA: Diagnosis not present

## 2024-07-08 DIAGNOSIS — Z23 Encounter for immunization: Secondary | ICD-10-CM | POA: Diagnosis not present

## 2024-07-08 DIAGNOSIS — D72819 Decreased white blood cell count, unspecified: Secondary | ICD-10-CM | POA: Diagnosis not present

## 2024-07-16 DIAGNOSIS — D849 Immunodeficiency, unspecified: Secondary | ICD-10-CM | POA: Diagnosis not present

## 2024-07-16 DIAGNOSIS — Z940 Kidney transplant status: Secondary | ICD-10-CM | POA: Diagnosis not present

## 2024-07-30 ENCOUNTER — Ambulatory Visit: Admitting: Nurse Practitioner

## 2024-07-30 VITALS — BP 120/70 | HR 75 | Temp 97.7°F | Ht 75.0 in | Wt 254.6 lb

## 2024-07-30 DIAGNOSIS — E669 Obesity, unspecified: Secondary | ICD-10-CM | POA: Diagnosis not present

## 2024-07-30 MED ORDER — WEGOVY 2.4 MG/0.75ML ~~LOC~~ SOAJ: 2.4000 mg | SUBCUTANEOUS | 0 refills | Status: DC

## 2024-07-30 NOTE — Progress Notes (Signed)
 Established Patient Office Visit  Subjective   Patient ID: Eric Douglas, male    DOB: [birth year]  Age: 53 y.o. MRN: [id number]  Chief Complaint  Patient presents with  . Follow-up    Wegovy /obesity. Pt complains of doing well on wegovy .     HPI  Discussed the use of AI scribe software for clinical note transcription with the patient, who gave verbal consent to proceed.  History of Present Illness Eric Douglas is a 53 year old male who presents for follow-up on Wegovy  treatment.  He has been on a 2.4 mg weekly dose of Wegovy  for approximately three to four months and tolerates the medication well with no significant side effects. His appetite has normalized, and he is not experiencing constant hunger. He consumes three to four smaller meals per day without snacking in between. He has experienced a significant weight reduction from 319 pounds to 254 pounds, a total loss of 65 pounds. He feels much better and has more energy. His weight loss has slowed down, and he is content with his current weight and physical condition.  He reports regular bowel movements, typically once a day or every other day, with a maximum interval of three days between movements. He occasionally uses Senokot to aid with bowel movements. He drinks a lot of fluids and reports no significant issues with constipation.  He is currently taking hydrochlorothiazide 12.5 mg and metoprolol  25 mg. He previously increased hydrochlorothiazide to 25 mg but experienced worsening headaches, so it was reduced back to 12.5 mg. No fever, chills, chest pain, or shortness of breath. His kidney function is reportedly doing well, with his last checkup being the best so far. He is up to date with his flu, pneumonia, tetanus, and shingles vaccinations.    Review of Systems  Constitutional:  Negative for chills and fever.  Respiratory:  Negative for shortness of breath.   Cardiovascular:  Negative for chest pain.   Gastrointestinal:  Positive for constipation.      Objective:     BP 120/70   Pulse 75   Temp 97.7 F (36.5 C) (Oral)   Ht 6' 3 (1.905 m)   Wt 254 lb 9.6 oz (115.5 kg)   SpO2 99%   BMI 31.82 kg/m  BP Readings from Last 3 Encounters:  07/30/24 120/70  04/29/24 122/80  01/14/24 138/88   Wt Readings from Last 3 Encounters:  07/30/24 254 lb 9.6 oz (115.5 kg)  07/01/24 265 lb 8 oz (120.4 kg)  04/29/24 265 lb 8 oz (120.4 kg)   SpO2 Readings from Last 3 Encounters:  07/30/24 99%  04/29/24 98%  01/14/24 95%      Physical Exam Vitals and nursing note reviewed.  Constitutional:      Appearance: Normal appearance.  Cardiovascular:     Rate and Rhythm: Normal rate and regular rhythm.     Heart sounds: Normal heart sounds.  Pulmonary:     Effort: Pulmonary effort is normal.     Breath sounds: Normal breath sounds.  Abdominal:     General: Bowel sounds are normal.  Neurological:     Mental Status: He is alert.      No results found for any visits on 07/30/24.    The 10-year ASCVD risk score (Arnett DK, et al., 2019) is: 7.3%    Assessment & Plan:   Problem List Items Addressed This Visit       Other   Obesity (BMI 30-39.9) - Primary  Relevant Medications   semaglutide -weight management (WEGOVY ) 2.4 MG/0.75ML SOAJ SQ injection  Assessment and Plan Assessment & Plan Obesity Obesity managed with Semaglutide  2.4 mg weekly. Significant weight loss achieved, appetite controlled, improved energy levels. No specific goal weight set, content with progress. - Continue Semaglutide  2.4 mg subcutaneous injection weekly. - Refill prescription for Semaglutide  for three months. - Follow up in three months for weight management review.  Essential hypertension Blood pressure well-controlled with hydrochlorothiazide 12.5 mg and metoprolol  25 mg. Previous increase in hydrochlorothiazide caused headaches without benefit. - Continue hydrochlorothiazide 12.5 mg and  metoprolol  25 mg as prescribed.  Status Post Kidney transplant Chronic kidney disease well-managed, recent checkup best so far, no symptoms reported.  Constipation Constipation managed with Senokot, regular bowel movements, adequate fluid intake. - Continue Senokot as needed for constipation.   Return in about 3 months (around 10/30/2024) for wegovy  and weight recheck .    Adina Crandall, NP

## 2024-07-30 NOTE — Patient Instructions (Signed)
 Nice to see you today  I have refilled the wegovy  Follow up with me in 3 months, sooner if you need me

## 2024-07-31 ENCOUNTER — Other Ambulatory Visit (HOSPITAL_COMMUNITY): Payer: Self-pay

## 2024-07-31 ENCOUNTER — Other Ambulatory Visit: Payer: Self-pay | Admitting: Nurse Practitioner

## 2024-07-31 ENCOUNTER — Telehealth: Payer: Self-pay

## 2024-07-31 DIAGNOSIS — E669 Obesity, unspecified: Secondary | ICD-10-CM

## 2024-07-31 NOTE — Telephone Encounter (Signed)
 Pharmacy Patient Advocate Encounter   Received notification from Onbase that prior authorization for Wegovy  2.4 is required/requested.   Insurance verification completed.   The patient is insured through [facility].   Per test claim: PA required; PA submitted to above mentioned insurance via Latent Key/confirmation #/EOC [id number] Status is pending

## 2024-07-31 NOTE — Telephone Encounter (Signed)
 Pharmacy Patient Advocate Encounter  Received notification from [facility] that Prior Authorization for Wegovy  2.4 has been APPROVED from 07/31/24 to 07/31/25. Unable to obtain price due to refill too soon rejection, last fill date 07/11/24 next available fill date11/07/25   PA #/Case ID/Reference #: # [id number]

## 2024-08-03 ENCOUNTER — Other Ambulatory Visit (HOSPITAL_COMMUNITY): Payer: Self-pay

## 2024-08-03 NOTE — Telephone Encounter (Signed)
 Pharmacy stating PA needed for wegovy 

## 2024-08-07 NOTE — Telephone Encounter (Signed)
 Refused the rx with message that refill too soon.

## 2024-08-08 ENCOUNTER — Other Ambulatory Visit: Payer: Self-pay | Admitting: Nurse Practitioner

## 2024-08-08 DIAGNOSIS — E669 Obesity, unspecified: Secondary | ICD-10-CM

## 2024-08-10 DIAGNOSIS — H539 Unspecified visual disturbance: Secondary | ICD-10-CM | POA: Diagnosis not present

## 2024-08-10 DIAGNOSIS — G44019 Episodic cluster headache, not intractable: Secondary | ICD-10-CM | POA: Diagnosis not present

## 2024-08-10 DIAGNOSIS — H5310 Unspecified subjective visual disturbances: Secondary | ICD-10-CM | POA: Diagnosis not present

## 2024-08-10 DIAGNOSIS — H538 Other visual disturbances: Secondary | ICD-10-CM | POA: Diagnosis not present

## 2024-08-13 DIAGNOSIS — Z940 Kidney transplant status: Secondary | ICD-10-CM | POA: Diagnosis not present

## 2024-08-13 DIAGNOSIS — Z79899 Other long term (current) drug therapy: Secondary | ICD-10-CM | POA: Diagnosis not present

## 2024-08-13 DIAGNOSIS — D849 Immunodeficiency, unspecified: Secondary | ICD-10-CM | POA: Diagnosis not present

## 2024-08-25 DIAGNOSIS — H5310 Unspecified subjective visual disturbances: Secondary | ICD-10-CM | POA: Diagnosis not present

## 2024-08-25 DIAGNOSIS — H538 Other visual disturbances: Secondary | ICD-10-CM | POA: Diagnosis not present

## 2024-09-14 DIAGNOSIS — Z79899 Other long term (current) drug therapy: Secondary | ICD-10-CM | POA: Diagnosis not present

## 2024-09-14 DIAGNOSIS — Z940 Kidney transplant status: Secondary | ICD-10-CM | POA: Diagnosis not present

## 2024-09-14 DIAGNOSIS — D849 Immunodeficiency, unspecified: Secondary | ICD-10-CM | POA: Diagnosis not present

## 2024-10-21 ENCOUNTER — Ambulatory Visit

## 2024-10-21 ENCOUNTER — Ambulatory Visit: Admitting: Pulmonary Disease

## 2024-10-23 ENCOUNTER — Ambulatory Visit

## 2024-10-23 VITALS — Ht 77.0 in | Wt 254.0 lb

## 2024-10-23 DIAGNOSIS — Z0000 Encounter for general adult medical examination without abnormal findings: Secondary | ICD-10-CM | POA: Diagnosis not present

## 2024-10-23 NOTE — Patient Instructions (Signed)
 [name],  Thank you for taking the time for your Medicare Wellness Visit. I appreciate your continued commitment to your health goals. Please review the care plan we discussed, and feel free to reach out if I can assist you further.  Please note that Annual Wellness Visits do not include a physical exam. Some assessments may be limited, especially if the visit was conducted virtually. If needed, we may recommend an in-person follow-up with your provider.  Ongoing Care Seeing your primary care provider every 3 to 6 months helps us  monitor your health and provide consistent, personalized care.   Referrals If a referral was made during today's visit and you haven't received any updates within two weeks, please contact the referred provider directly to check on the status.  Recommended Screenings:  Health Maintenance  Topic Date Due   COVID-19 Vaccine (3 - Pfizer risk series) 02/16/2020   Medicare Annual Wellness Visit  08/11/2024   Colon Cancer Screening  07/01/2027   DTaP/Tdap/Td vaccine (4 - Td or Tdap) 12/03/2028   Pneumococcal Vaccine for age over 27  Completed   Flu Shot  Completed   Hepatitis B Vaccine  Completed   Hepatitis C Screening  Completed   HIV Screening  Completed   Zoster (Shingles) Vaccine  Completed   HPV Vaccine  Aged Out   Meningitis B Vaccine  Aged Out       10/23/2024    1:46 PM  Advanced Directives  Does Patient Have a Medical Advance Directive? No  Would patient like information on creating a medical advance directive? Yes (MAU/Ambulatory/Procedural Areas - Information given)    Vision: Annual vision screenings are recommended for early detection of glaucoma, cataracts, and diabetic retinopathy. These exams can also reveal signs of chronic conditions such as diabetes and high blood pressure.  Dental: Annual dental screenings help detect early signs of oral cancer, gum disease, and other conditions linked to overall health, including heart disease and  diabetes.  Please see the attached documents for additional preventive care recommendations.

## 2024-10-23 NOTE — Progress Notes (Signed)
 "  Chief Complaint  Patient presents with   Medicare Wellness     Subjective:   Eric Douglas is a 54 y.o. male who presents for a Medicare Annual Wellness Visit.  Visit info / Clinical Intake: Medicare Wellness Visit Type:: Subsequent Annual Wellness Visit Persons participating in visit and providing information:: patient Medicare Wellness Visit Mode:: Telephone If telephone:: video declined Since this visit was completed virtually, some vitals may be partially provided or unavailable. Missing vitals are due to the limitations of the virtual format.: Unable to obtain vitals - no equipment If Telephone or Video please confirm:: I connected with patient using audio/video enable telemedicine. I verified patient identity with two identifiers, discussed telehealth limitations, and patient agreed to proceed. Patient Location:: home Provider Location:: home officce Interpreter Needed?: No Pre-visit prep was completed: yes AWV questionnaire completed by patient prior to visit?: no Living arrangements:: with family/others Patient's Overall Health Status Rating: very good Typical amount of pain: none Does pain affect daily life?: no Are you currently prescribed opioids?: no  Dietary Habits and Nutritional Risks How many meals a day?: 3 Eats fruit and vegetables daily?: yes Most meals are obtained by: preparing own meals; eating out (eat out at lunch) In the last 2 weeks, have you had any of the following?: none Diabetic:: no  Functional Status Activities of Daily Living (to include ambulation/medication): Independent Ambulation: Independent Medication Administration: Independent Home Management (perform basic housework or laundry): Independent Manage your own finances?: yes Primary transportation is: driving Concerns about vision?: no *vision screening is required for WTM* Concerns about hearing?: no  Fall Screening Falls in the past year?: 0 Number of falls in past year:  0 Was there an injury with Fall?: 0 Fall Risk Category Calculator: 0 Patient Fall Risk Level: Low Fall Risk  Fall Risk Patient at Risk for Falls Due to: No Fall Risks Fall risk Follow up: Education provided; Falls evaluation completed; Falls prevention discussed  Home and Transportation Safety: All rugs have non-skid backing?: yes All stairs or steps have railings?: yes Grab bars in the bathtub or shower?: yes Have non-skid surface in bathtub or shower?: yes Good home lighting?: yes Regular seat belt use?: yes Hospital stays in the last year:: no  Cognitive Assessment Difficulty concentrating, remembering, or making decisions? : no Will 6CIT or Mini Cog be Completed: yes What year is it?: 0 points What month is it?: 0 points Give patient an address phrase to remember (5 components): [address] [employer] [place]  About what time is it?: 0 points Count backwards from 20 to 1: 0 points Say the months of the year in reverse: 0 points Repeat the address phrase from earlier: 0 points 6 CIT Score: 0 points  Advance Directives (For Healthcare) Does Patient Have a Medical Advance Directive?: No Would patient like information on creating a medical advance directive?: Yes (MAU/Ambulatory/Procedural Areas - Information given)  Reviewed/Updated  Reviewed/Updated: Reviewed All (Medical, Surgical, Family, Medications, Allergies, Care Teams, Patient Goals)    Allergies (verified) Patient has no known allergies.   Current Medications (verified) Outpatient Encounter Medications as of 10/23/2024  Medication Sig   acetaminophen  (TYLENOL ) 325 MG tablet Take by mouth.   BELATACEPT  IV Inject 1 Dose into the vein every 30 (thirty) days.   cinacalcet (SENSIPAR) 30 MG tablet Take 30 mg by mouth daily.   EMGALITY, 300 MG DOSE, 100 MG/ML SOSY Inject 300 mg into the skin.   hydrochlorothiazide (HYDRODIURIL) 12.5 MG tablet Take by mouth.   meclizine  (  ANTIVERT ) 25 MG tablet Take 25 mg by mouth 3  (three) times daily as needed.   metoprolol  tartrate (LOPRESSOR ) 25 MG tablet Take 25 mg by mouth 2 (two) times daily.   naratriptan (AMERGE) 2.5 MG tablet Take 2.5 mg by mouth as needed.   semaglutide -weight management (WEGOVY ) 2.4 MG/0.75ML SOAJ SQ injection INJECT 2.4 MG INTO THE SKIN ONCE A WEEK.   tamsulosin  (FLOMAX ) 0.4 MG CAPS capsule Take 0.4 mg by mouth daily.   testosterone  cypionate (DEPOTESTOSTERONE CYPIONATE) 200 MG/ML injection SMARTSIG:0.5 Milliliter(s) IM Once a Week   No facility-administered encounter medications on file as of 10/23/2024.    History: Past Medical History:  Diagnosis Date   Fibula fracture    ORIF 1990   Gout    non crystal proven    History of chickenpox    History of kidney disease    HTN (hypertension)    Nephropathy    Ig A; dx 2002. micropscopic hematuria and proteinuria   Tibia fracture    ORIF 1990   Varicocele    L testes   Venous incompetence    R leg   Past Surgical History:  Procedure Laterality Date   APPENDECTOMY  1982   AV FISTULA PLACEMENT Left 06/27/2017   Procedure: LEFT ARM ARTERIOVENOUS (AV) FISTULA CREATION;  Surgeon: Sheree Penne Bruckner, MD;  Location: [facility];  Service: Vascular;  Laterality: Left;   cardiac ablation  07/16/2023   CARDIOVERSION N/A 12/21/2021   Procedure: CARDIOVERSION;  Surgeon: Francyne Headland, MD;  Location: [facility] ENDOSCOPY;  Service: Cardiovascular;  Laterality: N/A;   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   stab phlebectomy Left 12/17/2016   stab phlebectomy >20 incisions (left leg) by Lynwood Collum MD    Family History  Problem Relation Age of Onset   Cancer Paternal Grandfather 15       brain cancer   Hypertension Other        GP   Stroke Other    Diabetes Other        DM - GP    Heart disease Neg Hx    Social History   Occupational History   Occupation: Market Researcher    Comment: [employer]  Tobacco Use   Smoking status: Never   Smokeless tobacco: Never   Tobacco comments:     Never smoke 12/28/21  Vaping Use   Vaping status: Never Used  Substance and Sexual Activity   Alcohol use: No    Alcohol/week: 0.0 standard drinks of alcohol   Drug use: No   Sexual activity: Yes   Tobacco Counseling Counseling given: Not Answered Tobacco comments: Never smoke 12/28/21  SDOH Screenings   Food Insecurity: No Food Insecurity (10/23/2024)  Housing: Unknown (10/23/2024)  Transportation Needs: No Transportation Needs (10/23/2024)  Utilities: Not At Risk (10/23/2024)  Alcohol Screen: Low Risk (08/12/2023)  Depression (PHQ2-9): Low Risk (10/23/2024)  Financial Resource Strain: Low Risk (08/12/2023)  Physical Activity: Insufficiently Active (10/23/2024)  Social Connections: Moderately Isolated (10/23/2024)  Stress: No Stress Concern Present (10/23/2024)  Tobacco Use: Low Risk (10/23/2024)  Health Literacy: Adequate Health Literacy (10/23/2024)   See flowsheets for full screening details  Depression Screen PHQ 2 & 9 Depression Scale- Over the past 2 weeks, how often have you been bothered by any of the following problems? Little interest or pleasure in doing things: 0 Feeling down, depressed, or hopeless (PHQ Adolescent also includes...irritable): 0 PHQ-2 Total Score: 0 Trouble falling or staying asleep, or sleeping too much: 1 Feeling tired or  having little energy: 0 Poor appetite or overeating (PHQ Adolescent also includes...weight loss): 0 Feeling bad about yourself - or that you are a failure or have let yourself or your family down: 0 Trouble concentrating on things, such as reading the newspaper or watching television (PHQ Adolescent also includes...like school work): 0 Moving or speaking so slowly that other people could have noticed. Or the opposite - being so fidgety or restless that you have been moving around a lot more than usual: 0 Thoughts that you would be better off dead, or of hurting yourself in some way: 0 PHQ-9 Total Score: 1 If you checked off any problems, how  difficult have these problems made it for you to do your work, take care of things at home, or get along with other people?: Not difficult at all     Goals Addressed             This Visit's Progress    Patient Stated   On track    Exercise 3 days :walking,etc     Weight (lb) < 245 lb (111.1 kg)   254 lb (115.2 kg)            Objective:    Today's Vitals   10/23/24 1345  Weight: 254 lb (115.2 kg)  Height: 6' 5 (1.956 m)   Body mass index is 30.12 kg/m.  Hearing/Vision screen No results found. Immunizations and Health Maintenance Health Maintenance  Topic Date Due   COVID-19 Vaccine (3 - Pfizer risk series) 02/16/2020   Medicare Annual Wellness (AWV)  08/11/2024   Colonoscopy  07/01/2027   DTaP/Tdap/Td (4 - Td or Tdap) 12/03/2028   Pneumococcal Vaccine: 50+ Years  Completed   Influenza Vaccine  Completed   Hepatitis B Vaccines 19-59 Average Risk  Completed   Hepatitis C Screening  Completed   HIV Screening  Completed   Zoster Vaccines- Shingrix   Completed   HPV VACCINES  Aged Out   Meningococcal B Vaccine  Aged Out        Assessment/Plan:  This is a routine wellness examination for Eric Douglas.  Patient Care Team: Wendee Lynwood HERO, NP as PCP - General (Pain Medicine) Fate Morna SAILOR, [facility] (Inactive) as Pharmacist (Pharmacist) Camillo Golas, MD as Attending Physician (Ophthalmology) Center, [facility] as Consulting Physician (Nephrology)  I have personally reviewed and noted the following in the patients chart:   Medical and social history Use of alcohol, tobacco or illicit drugs  Current medications and supplements including opioid prescriptions. Functional ability and status Nutritional status Physical activity Advanced directives List of other physicians Hospitalizations, surgeries, and ER visits in previous 12 months Vitals Screenings to include cognitive, depression, and falls Referrals and appointments  No orders of the  defined types were placed in this encounter.  In addition, I have reviewed and discussed with patient certain preventive protocols, quality metrics, and best practice recommendations. A written personalized care plan for preventive services as well as general preventive health recommendations were provided to patient.   Eric LITTIE Saris, LPN   8/0/7973   No follow-ups on file.  After Visit Summary: (MyChart) Due to this being a telephonic visit, the after visit summary with patients personalized plan was offered to patient via MyChart   Nurse Notes: No voiced or noted concerns at this time Patient advised to keep follow-up appointment with PCP (10/30/24) Appointment(s) made: (CPE March 2026) Vaccines not given: Will obtain covid at pharmacy  "

## 2024-10-25 ENCOUNTER — Other Ambulatory Visit: Payer: Self-pay | Admitting: Nurse Practitioner

## 2024-10-25 DIAGNOSIS — E669 Obesity, unspecified: Secondary | ICD-10-CM

## 2024-10-30 ENCOUNTER — Encounter: Payer: Self-pay | Admitting: Nurse Practitioner

## 2024-10-30 ENCOUNTER — Ambulatory Visit: Admitting: Nurse Practitioner

## 2024-10-30 DIAGNOSIS — E669 Obesity, unspecified: Secondary | ICD-10-CM | POA: Diagnosis not present

## 2024-10-30 MED ORDER — WEGOVY 2.4 MG/0.75ML ~~LOC~~ SOAJ: 2.4000 mg | SUBCUTANEOUS | 0 refills | Status: DC

## 2024-10-30 NOTE — Progress Notes (Signed)
 "  Established Patient Office Visit  Subjective   Patient ID: Eric Douglas, male    DOB: 1971/03/08  Age: 54 y.o. MRN: [id number]  Chief Complaint  Patient presents with   Follow-up    Wegovy  and weight. Pt complains that wegovy  may need to be stopped due to insurance/price increase.     HPI  Discussed the use of AI scribe software for clinical note transcription with the patient, who gave verbal consent to proceed.  History of Present Illness Eric Douglas is a 54 year old male who presents for a follow-up on Wegovy  treatment.  He is currently on Wegovy  2.4 mg for weight management and has experienced a significant weight loss of 65 pounds, having previously weighed 319 pounds. No adverse effects such as constipation or nausea. Bowel movements occur daily or every other day, and appetite is normal, typically consuming three meals a day, occasionally substituting a full meal with a protein snack like rotisserie chicken. He has intentionally maintained his current weight for the past month or two to ensure sustainability, as he has struggled with maintaining weight loss in the past.  He is physically active, engaging in activities such as cutting and splitting wood, which he does both manually and with a wood splitter. He has not joined a gym but remains active through his work and home activities.  He discusses a change in his insurance plan, which has affected the cost of his medication. Previously, his prescription was covered at no charge, but recent changes have resulted in a significant increase in cost, with one pharmacy quoting $3,800. He is exploring options to reduce this cost, including using a commercial coupon to lower it to $350 per month. His primary insurance is Medicare, with [employer] as secondary.    Review of Systems  Constitutional:  Negative for chills and fever.  Respiratory:  Negative for shortness of breath.   Cardiovascular:  Negative for  chest pain.  Gastrointestinal:  Negative for abdominal pain, constipation, nausea and vomiting.      Objective:     BP 118/70   Pulse 67   Temp 97.8 F (36.6 C) (Oral)   Ht 6' 5 (1.956 m)   Wt 254 lb 12.8 oz (115.6 kg)   SpO2 93%   BMI 30.21 kg/m  BP Readings from Last 3 Encounters:  10/30/24 118/70  07/30/24 120/70  04/29/24 122/80   Wt Readings from Last 3 Encounters:  10/30/24 254 lb 12.8 oz (115.6 kg)  10/23/24 254 lb (115.2 kg)  07/30/24 254 lb 9.6 oz (115.5 kg)   SpO2 Readings from Last 3 Encounters:  10/30/24 93%  07/30/24 99%  04/29/24 98%      Physical Exam Vitals and nursing note reviewed.  Constitutional:      Appearance: Normal appearance.  Cardiovascular:     Rate and Rhythm: Normal rate and regular rhythm.     Heart sounds: Normal heart sounds.  Pulmonary:     Effort: Pulmonary effort is normal.     Breath sounds: Normal breath sounds.  Abdominal:     General: Bowel sounds are normal.  Neurological:     Mental Status: He is alert.      No results found for any visits on 10/30/24.    The 10-year ASCVD risk score (Arnett DK, et al., 2019) is: 7.1%    Assessment & Plan:   Problem List Items Addressed This Visit       Other   Obesity (BMI 30-39.9)  Relevant Medications   semaglutide -weight management (WEGOVY ) 2.4 MG/0.75ML SOAJ SQ injection  Assessment and Plan Assessment & Plan Obesity Management with Wegovy  2.4 mg subcutaneous weekly is ongoing. No adverse effects reported. Significant weight loss of 65 pounds. Insurance coverage changed, cost now $350 per month. Discussed oral version option at $150 if cost becomes prohibitive. - Sent prescription for Wegovy  2.4 mg subcutaneous weekly. - Advised to use a commercial coupon to reduce cost to $350 per month. - Discussed potential switch to oral version of Wegovy  if cost becomes prohibitive. - Scheduled follow-up in three months for physical exam and blood work. - Continue working  on lifestyle modifications inclusive of diet and exercise.  Constipation No current issues with constipation. Bowel movements are regular.   Return in about 3 months (around 01/28/2025) for CPE and Labs.    Adina Crandall, NP  "

## 2024-10-30 NOTE — Patient Instructions (Signed)
 Nice to see you today Ask them to run a commercial coupon Follow up with me in 3 months, sooner if you need me

## 2024-11-02 NOTE — Telephone Encounter (Signed)
 Is there any guidance when switching people from injectable wegovy  to the oral form?  By guidance like dose to dose comparability

## 2024-11-04 MED ORDER — WEGOVY 9 MG PO TABS: 9.0000 mg | ORAL_TABLET | Freq: Every day | ORAL | 0 refills | Status: DC

## 2024-11-10 ENCOUNTER — Other Ambulatory Visit (HOSPITAL_COMMUNITY): Payer: Self-pay

## 2024-11-13 ENCOUNTER — Telehealth: Payer: Self-pay

## 2024-11-13 ENCOUNTER — Other Ambulatory Visit (HOSPITAL_COMMUNITY): Payer: Self-pay

## 2024-11-13 NOTE — Telephone Encounter (Signed)
 Pharmacy Patient Advocate Encounter   Received notification from [facility] KEY that prior authorization for Wegovy  9MG  tablets is required/requested.   Insurance verification completed.   The patient is insured through [facility].   Per test claim: PA required; PA submitted to above mentioned insurance via Latent Key/confirmation #/EOC [id number] Status is pending

## 2024-11-16 ENCOUNTER — Other Ambulatory Visit (HOSPITAL_COMMUNITY): Payer: Self-pay

## 2024-11-16 NOTE — Telephone Encounter (Signed)
 Pharmacy Patient Advocate Encounter  Received notification from [facility] that Prior Authorization for Wegovy  9MG  tablets has been APPROVED from 11/13/24 to 05/12/25   PA #/Case ID/Reference #: [id number]

## 2024-11-19 MED ORDER — WEGOVY 9 MG PO TABS: 9.0000 mg | ORAL_TABLET | Freq: Every day | ORAL | 0 refills | Status: AC

## 2024-11-19 NOTE — Addendum Note (Signed)
 Addended by: WENDELL ARLAND RAMAN on: 11/19/2024 11:22 AM   Modules accepted: Orders

## 2024-12-29 ENCOUNTER — Ambulatory Visit: Admitting: Pulmonary Disease

## 2025-01-06 ENCOUNTER — Encounter: Admitting: Nurse Practitioner

## 2025-01-28 ENCOUNTER — Encounter: Admitting: Nurse Practitioner
